# Patient Record
Sex: Female | Born: 1950 | Race: White | Hispanic: No | Marital: Married | Smoking: Never smoker
Health system: Southern US, Community
[De-identification: ages and names within clinical notes are randomized; demographics above are authoritative.]

## PROBLEM LIST (undated history)

## (undated) DIAGNOSIS — E039 Hypothyroidism, unspecified: Secondary | ICD-10-CM

## (undated) DIAGNOSIS — E119 Type 2 diabetes mellitus without complications: Secondary | ICD-10-CM

## (undated) DIAGNOSIS — N289 Disorder of kidney and ureter, unspecified: Secondary | ICD-10-CM

## (undated) DIAGNOSIS — I10 Essential (primary) hypertension: Secondary | ICD-10-CM

## (undated) DIAGNOSIS — R002 Palpitations: Secondary | ICD-10-CM

## (undated) DIAGNOSIS — E785 Hyperlipidemia, unspecified: Secondary | ICD-10-CM

## (undated) DIAGNOSIS — I4819 Other persistent atrial fibrillation: Secondary | ICD-10-CM

## (undated) DIAGNOSIS — I480 Paroxysmal atrial fibrillation: Principal | ICD-10-CM

## (undated) HISTORY — DX: Paroxysmal atrial fibrillation: I48.0

## (undated) HISTORY — DX: Hypothyroidism, unspecified: E03.9

## (undated) HISTORY — PX: EYE SURGERY: SHX253

## (undated) HISTORY — PX: BREAST BIOPSY: SHX20

## (undated) HISTORY — DX: Disorder of kidney and ureter, unspecified: N28.9

## (undated) HISTORY — DX: Type 2 diabetes mellitus without complications: E11.9

## (undated) HISTORY — DX: Palpitations: R00.2

## (undated) HISTORY — DX: Hyperlipidemia, unspecified: E78.5

## (undated) HISTORY — DX: Essential (primary) hypertension: I10

---
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
## 1991-01-24 HISTORY — PX: BREAST EXCISIONAL BIOPSY: SUR124

## 2001-12-04 ENCOUNTER — Ambulatory Visit (HOSPITAL_COMMUNITY): Admission: RE | Admit: 2001-12-04 | Discharge: 2001-12-04 | Payer: Self-pay | Admitting: Gastroenterology

## 2001-12-04 ENCOUNTER — Encounter (INDEPENDENT_AMBULATORY_CARE_PROVIDER_SITE_OTHER): Payer: Self-pay | Admitting: Specialist

## 2002-12-25 ENCOUNTER — Encounter: Admission: RE | Admit: 2002-12-25 | Discharge: 2002-12-25 | Payer: Self-pay | Admitting: Family Medicine

## 2003-07-29 ENCOUNTER — Other Ambulatory Visit: Admission: RE | Admit: 2003-07-29 | Discharge: 2003-07-29 | Payer: Self-pay | Admitting: Family Medicine

## 2004-08-08 ENCOUNTER — Other Ambulatory Visit: Admission: RE | Admit: 2004-08-08 | Discharge: 2004-08-08 | Payer: Self-pay | Admitting: Family Medicine

## 2004-08-24 ENCOUNTER — Encounter: Admission: RE | Admit: 2004-08-24 | Discharge: 2004-08-24 | Payer: Self-pay | Admitting: Family Medicine

## 2004-11-22 ENCOUNTER — Ambulatory Visit: Payer: Self-pay

## 2005-06-09 ENCOUNTER — Ambulatory Visit (HOSPITAL_COMMUNITY): Admission: RE | Admit: 2005-06-09 | Discharge: 2005-06-09 | Payer: Self-pay | Admitting: Podiatry

## 2005-06-09 ENCOUNTER — Encounter: Payer: Self-pay | Admitting: Vascular Surgery

## 2005-08-15 ENCOUNTER — Encounter: Admission: RE | Admit: 2005-08-15 | Discharge: 2005-08-15 | Payer: Self-pay | Admitting: Family Medicine

## 2005-08-15 ENCOUNTER — Ambulatory Visit: Payer: Self-pay | Admitting: *Deleted

## 2005-08-22 ENCOUNTER — Ambulatory Visit: Payer: Self-pay | Admitting: *Deleted

## 2005-08-22 ENCOUNTER — Ambulatory Visit: Payer: Self-pay

## 2005-10-25 ENCOUNTER — Ambulatory Visit: Payer: Self-pay | Admitting: *Deleted

## 2005-11-20 ENCOUNTER — Emergency Department (HOSPITAL_COMMUNITY): Admission: EM | Admit: 2005-11-20 | Discharge: 2005-11-20 | Payer: Self-pay | Admitting: Family Medicine

## 2006-04-06 ENCOUNTER — Encounter: Admission: RE | Admit: 2006-04-06 | Discharge: 2006-04-06 | Payer: Self-pay | Admitting: Family Medicine

## 2007-02-26 ENCOUNTER — Ambulatory Visit: Payer: Self-pay | Admitting: Cardiovascular Disease

## 2007-03-13 ENCOUNTER — Encounter: Payer: Self-pay | Admitting: Cardiovascular Disease

## 2007-03-13 ENCOUNTER — Ambulatory Visit: Payer: Self-pay

## 2007-04-08 ENCOUNTER — Encounter: Admission: RE | Admit: 2007-04-08 | Discharge: 2007-04-08 | Payer: Self-pay | Admitting: Family Medicine

## 2007-09-05 ENCOUNTER — Ambulatory Visit: Payer: Self-pay | Admitting: Cardiovascular Disease

## 2007-09-05 ENCOUNTER — Ambulatory Visit: Payer: Self-pay

## 2008-06-02 ENCOUNTER — Encounter: Admission: RE | Admit: 2008-06-02 | Discharge: 2008-06-02 | Payer: Self-pay | Admitting: Family Medicine

## 2009-01-20 ENCOUNTER — Encounter: Admission: RE | Admit: 2009-01-20 | Discharge: 2009-01-20 | Payer: Self-pay | Admitting: Family Medicine

## 2009-08-12 ENCOUNTER — Encounter: Admission: RE | Admit: 2009-08-12 | Discharge: 2009-08-12 | Payer: Self-pay | Admitting: Family Medicine

## 2010-06-07 NOTE — Assessment & Plan Note (Signed)
[facility]                            CARDIOLOGY OFFICE NOTE   JACARRA, BOBAK                       MRN:          [id number]  DATE:02/26/2007                            DOB:          Mar 28, 1950    Courtney Pearson is a 60 year old patient referred back by Dr. Nonie Hoyer and  Dr. Shaune Pollack. She has had recurrent palpitations.  The patient has  seen Dr. Glennon Hamilton in the past.  He has a history of MVP.  I do not  have a recent echo on her. She had an episode about 3 weeks ago, rapid  palpitations that lasted for about 30 minutes.  There was no associated  chest pain, PND, orthopnea.  There is no syncope.  The heart rate abated  on its own over the course of about 12 hours, but was really rapid for  only 30 minutes.  Her blood sugar was elevated at the time and she took  some extra insulin. By the next morning, she felt normal.  She has had a  previous PDS heart monitor done in July 2007, which showed isolated  bursts of atrial flutter versus PSVT. The patient had been on beta  blockers back in 2007, however, she had a fairly profound hypoglycemic  event with loss of consciousness and Dr. Kevan Ny decided that the risk of  exacerbating hypoglycemia warranted stopping her beta blocker.  There  was some discussion with her endocrinologist about this.  However, since  the patient's palpitations were infrequent I think that overall this was  probably a reasonable move.   The patient has not had a recurrence in the last 3 weeks.   Coronary risk factors include hypertension and diabetes.  She has been a  diabetic for over 35 years.  She does have some retinopathy but no other  complications.  She had a nuclear stress test in our office in July 2007  which was nonischemic with a normal EF.   Her review of systems otherwise negative.   PAST MEDICAL HISTORY:  Is remarkable for history of palpitations with  question of MVP, previous eye surgery with Dr.  Ashley Royalty, two previous C-  sections, previous right breast biopsy hyperthyroidism, diabetes and  hypertension.   ALLERGIES:  She denies any allergies.   CURRENT MEDICATIONS:  Include Humalog insulin, Diovan 80/12.5,  Synthroid 25 mcg a day, vitamin D and calcium.  We will add short-acting  Imdur all 10 mg p.r.n.   FAMILY HISTORY:  Is noncontributory.   The patient is remarried.  She has two older children by her first  marriage. Her husband is 9 years older than her. She is sedentary,  neither one of them work. She works on the computer quite a bit and  reads. She does not drink or smoke.   PHYSICAL EXAMINATION:  Is remarkable for healthy-appearing elderly white  female in no distress.  Blood pressure is 120/74, pulse 58 and regular,  afebrile.  Weight is 181.  HEENT:  Unremarkable.  Carotids are normal without bruit, no  lymphadenopathy, thyromegaly, JVP elevation.  LUNGS:  Clear with good  diaphragmatic motion.  No wheezing.  S1-S2.  There is a MR murmur at the apex.  ABDOMEN:  Benign.  Bowel sounds positive.  No AAA. No tenderness, no  hepatosplenomegaly, no hepatojugular reflux.  Distal pulses are intact, no edema.  NEURO: Nonfocal.  SKIN:  Warm and dry.   EKG shows sinus rhythm with incomplete right bundle branch block.   IMPRESSION:  1. 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  A 10 mg      Inderal prescription given to take p.r.n. Any recurrences she will      call to get a repeat PDS monitor and we will make an      electrophysiology referral  2. Mitral valve murmur.  Follow-up echocardiogram.  I did go over the      new guidelines with her in regards to no need for SBE prophylaxis.      We will check her LV function and mitral valve morphology.  3. In regards coronary artery disease, she is a lifelong diabetic. Her      last Myoview was in July 2007, I would probably repeat one in July      2009.  4. Hypothyroidism.  Continue Levothyroxine 25 mcg a day.  TSH and T4 to      be checked by Dr. Shaune Pollack for yearly physical next month.  5. Risk factor prophylaxis.  I would also check lipid and liver      profile with her routine lab work for her physical.  I suspect she      should probably be on a statin drug given her longstanding      diabetes.     Courtney Pick. Eden Emms, MD, [facility]  Electronically Signed    PCN/MedQ  DD: 02/26/2007  DT: 02/26/2007  Job #: [id number]   cc:   Duncan Dull, M.D.  Simone Curia

## 2010-06-07 NOTE — Assessment & Plan Note (Signed)
[facility]                            CARDIOLOGY OFFICE NOTE   Courtney Pearson, Courtney Pearson                       MRN:          [id number]  DATE:09/05/2007                            DOB:          [birth year]    Courtney Pearson returns today for followup.  She was previously patient of  Dr. Glennon Hamilton.  I last saw her in February.  She has longstanding  diabetes.  She has no documented coronary artery disease.  Her last  Myoview in July 2007 was nonischemic with an EF of 70%.   The patient has had ongoing palpitations.  She has a distant history of  isolated short PSVT.  No documented afib.  She had a recent colonoscopy  and was found to have PACs.  She feels that her palpitations are  increasing.  She does not describe typical PSVT-like symptoms.  She  describes forceful flip-flops in her heart.  I suspect she is having  some atrial arrhythmias, but not afib or flutter or anything that would  require Coumadin.  She tends to get these episodes at night,  particularly when she lays down since she is getting them frequently.  We will give her an event monitor and see what we find.  She does get  occasional exertional dyspnea.  I do not know this is an anginal  equivalent as she is a longstanding diabetic and she needs a followup  stress Myoview.  Her baseline EKG shows a pulmonary disease pattern with  an incomplete right bundle-branch block and nonspecific ST-T wave  changes and she is not a candidate for routine treadmill.   Her last echocardiogram showed an EF of 55-60%.  She carries a diagnosis  of mitral valve prolapse.  She had mild MR with mitral annular  calcification, but no prolapse.  This echo was done in February.   Her review of systems is otherwise negative.   She has no known allergies.   She is on:  1. Humalog.  2. Diovan and hydrochlorothiazide 80/12.5.  3. Synthroid 25 mcg a day.  She tells me her TSH was normal 2 months      ago.  4.  Vitamin D and calcium.  5. She does take baby aspirin daily.   PHYSICAL EXAMINATION:  GENERAL:  Remarkable for healthy-appearing, white  female in no distress.  VITAL SIGNS:  Weight is 178, blood pressure is 124/71, pulse 71 regular,  and afebrile.  HEENT:  Unremarkable.  NECK:  Carotids are normal without bruit. No lymphadenopathy,  thyromegaly, or JVP elevation.  LUNGS:  Clear.  Good diaphragmatic motion.  No wheezing.  S1 and S2 with  a systolic ejection murmur.  PMI normal.  \\  ABDOMEN:  Benign.  Bowel sounds positive.  No AAA.  No tenderness.  No  bruit.  No hepatosplenomegaly or hepatojugular reflux.  No tenderness.  EXTREMITIES:  Distal pulses were intact.  No edema.  NEURO:  Nonfocal.  SKIN:  Warm and dry.  No muscular weakness.   EKG was as described.   IMPRESSION:  1. Exertional dyspnea, question anginal equivalent and  a longstanding      diabetic, history of increasing palpitations, rule out ischemic      disease at baseline abnormal EKG.  Followup stress Myoview.  2. Premature atrial contractions during colonoscopy.  History of      paroxysmal supraventricular tachycardia.  Check event monitor.      Continue p.r.n. Inderal.  We do not have her on long-acting      medications due to her previous severe hypoglycemic reaction.  If      she has anything outside a premature atrial contractions documented      in her Myoview is nonischemic, she may be a candidate for an      antiarrhythmic drug.  3. Hypothyroidism.  Continue Synthroid 25 mcg a day.  TSH and T4      apparently normal.  4. Diabetes.  Hemoglobin A1c quarterly.  5. Hypertension in the setting of diabetes.  Continue Diovan and low-      salt diet.  I will see her back in 6 months, so long as her event      monitor and Myoview do not show any significant arrhythmias or      ischemia.      Courtney Pearson. Eden Emms, MD, [facility]  Electronically Signed    PCN/MedQ  DD: 09/05/2007  DT: 09/06/2007  Job #: [id number]

## 2010-06-10 NOTE — Op Note (Signed)
NAMELIESA, TSAN                          ACCOUNT NO.:  [id number]   MEDICAL RECORD NO.:  [id number]                   PATIENT TYPE:  AMB   LOCATION:  ENDO                                 FACILITY:  [facility]   PHYSICIAN:  Petra Kuba, M.D.                 DATE OF BIRTH:  1950-03-27   DATE OF PROCEDURE:  12/04/2001  DATE OF DISCHARGE:                                 OPERATIVE REPORT   PROCEDURE:  Colonoscopy.   INDICATIONS FOR PROCEDURE:  A patient with multiple GI complaints due for  colonic screening.   Consent was signed after risks, benefits, methods, and options were  thoroughly discussed in the office.   MEDICINES USED:  Demerol 60, Versed 8.   DESCRIPTION OF PROCEDURE:  Rectal inspection was pertinent for an external  skin tag and external hemorrhoids. Digital exam was negative. The pediatric  video adjustable colonoscope was inserted and with some difficulty due to  tortuous colon, we were able to advance to the cecum. This did require  rolling her on her back and various abdominal pressures. No obvious  abnormality was seen on insertion. The cecum was identified by the  appendiceal orifice and the ileocecal valve. In fact, the scope was inserted  a short ways into the terminal ileum which was normal. Photo documentation  was obtained, the scope was slowly withdrawn. The prep was adequate. There  was some liquid stool that required washing and suctioning on slow  withdrawal through the colon. The cecum, ascending, transverse and majority  of the descending was normal. In the proximal level of the sigmoid and  descending junction, a small raised area was seen, possibly this was a  suction mark from insertion, possibly a small polyp which was cold biopsied  x1. The scope was further withdrawn and no additional findings were seen as  we slowly withdrew back to the rectum. Once back in the rectum, the scope  was retroflexed pertinent for some internal hemorrhoids, small.  The scope  was straightened and readvanced a short ways up the left side of the colon,  air was suctioned, scope removed. The patient tolerated the procedure well.  There was no obvious or immediate complication.   ENDOSCOPIC DIAGNOSIS:  1. Internal and external hemorrhoids small an external skin tag.  2. Questionable tiny polyp versus suction mark in the sigmoid descending     junction status post cold biopsy.  3. Tortuous colon.  4. Otherwise within normal limits to the cecum and terminal ileum.    PLAN:  Await pathology but probably recheck colon screening in 5-10 years.  Happy to see back p.r.n., otherwise, return care to Dr. Kevan Ny for the  customary health care maintenance to include yearly rectals and guaiacs.  Petra Kuba, M.D.    MEM/MEDQ  D:  12/04/2001  T:  12/04/2001  Job:  [id number]   cc:   Duncan Dull, M.D.  [address]  [place]  [place] [address]  Fax: [phone]

## 2010-06-10 NOTE — Letter (Signed)
August 15, 2005     Duncan Dull, MD  [address]  [address]  [place], [place] [address]   RE:  Courtney, Pearson  MRN:  [id number]  /  DOB:  1950-12-10   Dear Lupita Leash:   It was a pleasure seeing this nice patient, Courtney Pearson, regarding  evaluation on August 15, 2005.  As you know, she is a very pleasant 60-year-  old white married female with history of mitral valve prolapse.  She also  has a history of hypertension and diabetes.  Recently she has noted  skipping heart beats.  She describes this as individual skip and then  followed by a hard beat.  This certainly sounds like PVCs.  She has  occasional chest tightness relieved by belching.  This is not related to  exertion.   It is noted the patient had a stress echo on November 22, 2004.  There was no  evidence of inducible wall motion abnormalities to suggest ischemia.  There  were abnormal ST segment changes.   Patient had laser surgery for retinopathy in 2002 and 2007.  She has a  history of breast lump in 1993.  She does not smoke or use alcohol.  She has  no allergies.   MEDICATIONS:  Humalog insulin, Diovan/hydrochlorothiazide 80/12.5.   REVIEW OF SYSTEMS:  She has occasional dizziness.  These are not associated  with the palpitations.  Eyes reveal some retinopathy with laser therapy.  Ears, Nose and Throat:  She may have some slight hearing loss.  Cardiorespiratory as noted above.  GI negative.  Genitourinary history  negative.  Gynecologic history:  She has 2 children. The remainder of the  review of systems is unremarkable.   ALLERGIES:  None.   SOCIAL HISTORY:  She is retired.  Moved here recently from [place].   FAMILY HISTORY:  Father died of anemia but had history of irregular  heartbeat.  Mother died of heart failure.  There is a family history of  hypertension and gout.   PHYSICAL EXAMINATION:  Blood pressure 122/68.  Pulse 69.  Normal sinus  rhythm.  JVP is not elevated. Carotid pulse is palpable  and equal without  bruits.  LUNGS:  Clear.  CARDIAC EXAM: 1/6 systolic murmur at left sternal border and a mid systolic  click and short systolic murmur at the apex.  ABDOMEN:  Not remarkable.  EXTREMITIES:  Normal.  Peripheral pulses are diminished.  She states she had  Doppler scans recently showing normal arterial flow.   Recent labs revealed glucose 184, LDL cholesterol 93.  The renal profile was  unremarkable.  BUN high normal at 26 and creatinine of 1.3.   EKG reveals RSR prime, otherwise normal.   IMPRESSION:  1.  Palpitations.  2.  Mitral valve prolapse.  3.  Hypertension, controlled.  4.  Diabetes mellitus on therapy.   PLAN:  Because of the history of abnormal stress EKG and the fact that she  has occasional tightness and palpitations, I have suggested a stress  Myoview.  In addition, we plan an event monitor.  I also instructed her to  take aspirin 325 and Zocor 20 because of her history of diabetes and  hypertension.   Thank you for the opportunity to share in this nice patient's care.  I will  plan to see her back in 4 to 6 weeks or at any other time you so desire.  With best regards.    Sincerely,  Cecil Cranker, MD, [facility]   EJL/MedQ  DD:  08/15/2005  DT:  08/15/2005  Job #:  [id number]

## 2010-06-10 NOTE — Letter (Signed)
October 25, 2005     Duncan Dull, M.D.  [address]  [address]  [place], [place] [address]   RE:  Courtney, Pearson  MRN:  [id number]  /  DOB:  [birth year]   Dear Lupita Leash:   It was a pleasure seeing this patient, Courtney Pearson, for followup on  October 25, 2005. As you know, she has mitral valve prolapse with  hypertension, diabetes, and palpitations. She underwent a stress study which  was normal. Left ventricular systolic function was normal. A Holt event  monitor revealed an episode of nine beat supraventricular tachycardia with a  rate of 200. She also had frequent premature atrial contractions. Previous  echocardiogram revealed normal left ventricular with an ejection fraction of  65-70%, with prominent echo density in the posterior mitral annulus,  possibly related to calcification. There was no significant mitral valve  prolapse noted. There was mild mitral regurgitation. Following the event  monitor and documentation of the brief supraventricular tachycardia, she was  started on Toprol XL 50 mg, and she has noted a reduction in palpitation.   She is also on Humalog, Insulin, Diovan, and hydrochlorothiazide 80/12.5.  VITAL SIGNS: Blood pressure 114/74, pulse 68 normal sinus rhythm.  GENERAL  APPEARANCE:  Normal.  JVP is not elevated.  Carotid pulses palpable without  bruits.  LUNGS:  Clear.  CARDIAC:  Exam reveals a 1/6 short systolic murmur,  left sternal border, no murmur or click. Echocardiogram reveals an RSR prime  consistent with right ventricular conduction delay, otherwise normal and  unchanged.   IMPRESSION:  Diagnosis as above. Tachy-palpitations probably related to  supraventricular tachycardia, improved on beta-blocker.  I suggested she  continue on the same therapy.  I will be happy to see her again in three or  four months and, if she has not had a recent  TSH, I think this should be performed. Thank you for the opportunity to  share in this nice patient's  care.    Sincerely,     ______________________________  E. Graceann Congress, MD, [facility]    EJL/MedQ  /  Job #:  [id number]  DD:  10/25/2005 / DT:  10/27/2005

## 2010-08-18 ENCOUNTER — Ambulatory Visit (INDEPENDENT_AMBULATORY_CARE_PROVIDER_SITE_OTHER): Payer: Managed Care, Other (non HMO) | Admitting: Ophthalmology

## 2010-08-18 DIAGNOSIS — H35379 Puckering of macula, unspecified eye: Secondary | ICD-10-CM

## 2010-08-18 DIAGNOSIS — E11359 Type 2 diabetes mellitus with proliferative diabetic retinopathy without macular edema: Secondary | ICD-10-CM

## 2010-08-18 DIAGNOSIS — H43819 Vitreous degeneration, unspecified eye: Secondary | ICD-10-CM

## 2010-11-11 ENCOUNTER — Other Ambulatory Visit: Payer: Self-pay | Admitting: Cardiology

## 2010-11-11 DIAGNOSIS — M79609 Pain in unspecified limb: Secondary | ICD-10-CM

## 2010-11-14 ENCOUNTER — Encounter (INDEPENDENT_AMBULATORY_CARE_PROVIDER_SITE_OTHER): Payer: Managed Care, Other (non HMO) | Admitting: *Deleted

## 2010-11-14 DIAGNOSIS — M79609 Pain in unspecified limb: Secondary | ICD-10-CM

## 2010-11-14 DIAGNOSIS — E1159 Type 2 diabetes mellitus with other circulatory complications: Secondary | ICD-10-CM

## 2011-02-22 ENCOUNTER — Ambulatory Visit (INDEPENDENT_AMBULATORY_CARE_PROVIDER_SITE_OTHER): Payer: Managed Care, Other (non HMO) | Admitting: Ophthalmology

## 2011-02-22 DIAGNOSIS — E11359 Type 2 diabetes mellitus with proliferative diabetic retinopathy without macular edema: Secondary | ICD-10-CM

## 2011-02-22 DIAGNOSIS — H43819 Vitreous degeneration, unspecified eye: Secondary | ICD-10-CM

## 2011-02-22 DIAGNOSIS — E1139 Type 2 diabetes mellitus with other diabetic ophthalmic complication: Secondary | ICD-10-CM

## 2011-08-25 ENCOUNTER — Ambulatory Visit (INDEPENDENT_AMBULATORY_CARE_PROVIDER_SITE_OTHER): Payer: Managed Care, Other (non HMO) | Admitting: Ophthalmology

## 2011-08-25 DIAGNOSIS — I10 Essential (primary) hypertension: Secondary | ICD-10-CM

## 2011-08-25 DIAGNOSIS — E11359 Type 2 diabetes mellitus with proliferative diabetic retinopathy without macular edema: Secondary | ICD-10-CM

## 2011-08-25 DIAGNOSIS — H43819 Vitreous degeneration, unspecified eye: Secondary | ICD-10-CM

## 2011-08-25 DIAGNOSIS — H35039 Hypertensive retinopathy, unspecified eye: Secondary | ICD-10-CM

## 2011-08-25 DIAGNOSIS — E1039 Type 1 diabetes mellitus with other diabetic ophthalmic complication: Secondary | ICD-10-CM

## 2011-10-31 ENCOUNTER — Other Ambulatory Visit: Payer: Self-pay | Admitting: Family Medicine

## 2011-10-31 DIAGNOSIS — Z1231 Encounter for screening mammogram for malignant neoplasm of breast: Secondary | ICD-10-CM

## 2011-11-01 ENCOUNTER — Other Ambulatory Visit: Payer: Self-pay | Admitting: Family Medicine

## 2011-11-01 ENCOUNTER — Ambulatory Visit
Admission: RE | Admit: 2011-11-01 | Discharge: 2011-11-01 | Disposition: A | Discharge status: Home / Self Care | Payer: Managed Care, Other (non HMO) | Source: Ambulatory Visit | Attending: Family Medicine | Admitting: Family Medicine

## 2011-11-01 DIAGNOSIS — R0781 Pleurodynia: Secondary | ICD-10-CM

## 2011-11-01 DIAGNOSIS — R059 Cough, unspecified: Secondary | ICD-10-CM

## 2011-11-01 DIAGNOSIS — R05 Cough: Secondary | ICD-10-CM

## 2011-11-02 ENCOUNTER — Other Ambulatory Visit: Payer: Managed Care, Other (non HMO)

## 2011-11-16 ENCOUNTER — Ambulatory Visit: Payer: Managed Care, Other (non HMO)

## 2011-12-27 ENCOUNTER — Ambulatory Visit
Admission: RE | Admit: 2011-12-27 | Discharge: 2011-12-27 | Disposition: A | Discharge status: Home / Self Care | Payer: Managed Care, Other (non HMO) | Source: Ambulatory Visit | Attending: Family Medicine | Admitting: Family Medicine

## 2011-12-27 DIAGNOSIS — Z1231 Encounter for screening mammogram for malignant neoplasm of breast: Secondary | ICD-10-CM

## 2012-02-28 ENCOUNTER — Ambulatory Visit (INDEPENDENT_AMBULATORY_CARE_PROVIDER_SITE_OTHER): Payer: Managed Care, Other (non HMO) | Admitting: Ophthalmology

## 2012-02-28 DIAGNOSIS — E11359 Type 2 diabetes mellitus with proliferative diabetic retinopathy without macular edema: Secondary | ICD-10-CM

## 2012-02-28 DIAGNOSIS — H35349 Macular cyst, hole, or pseudohole, unspecified eye: Secondary | ICD-10-CM

## 2012-02-28 DIAGNOSIS — H43819 Vitreous degeneration, unspecified eye: Secondary | ICD-10-CM

## 2012-02-28 DIAGNOSIS — I10 Essential (primary) hypertension: Secondary | ICD-10-CM

## 2012-02-28 DIAGNOSIS — E1039 Type 1 diabetes mellitus with other diabetic ophthalmic complication: Secondary | ICD-10-CM

## 2012-02-28 DIAGNOSIS — H35039 Hypertensive retinopathy, unspecified eye: Secondary | ICD-10-CM

## 2012-08-09 ENCOUNTER — Encounter: Payer: Managed Care, Other (non HMO) | Admitting: Cardiovascular Disease

## 2012-09-04 ENCOUNTER — Ambulatory Visit (INDEPENDENT_AMBULATORY_CARE_PROVIDER_SITE_OTHER): Payer: Managed Care, Other (non HMO) | Admitting: Ophthalmology

## 2012-09-12 ENCOUNTER — Ambulatory Visit (INDEPENDENT_AMBULATORY_CARE_PROVIDER_SITE_OTHER): Payer: Managed Care, Other (non HMO) | Admitting: Ophthalmology

## 2012-09-12 DIAGNOSIS — E11359 Type 2 diabetes mellitus with proliferative diabetic retinopathy without macular edema: Secondary | ICD-10-CM

## 2012-09-12 DIAGNOSIS — H43819 Vitreous degeneration, unspecified eye: Secondary | ICD-10-CM

## 2012-09-12 DIAGNOSIS — I10 Essential (primary) hypertension: Secondary | ICD-10-CM

## 2012-09-12 DIAGNOSIS — H35039 Hypertensive retinopathy, unspecified eye: Secondary | ICD-10-CM

## 2012-09-12 DIAGNOSIS — E1039 Type 1 diabetes mellitus with other diabetic ophthalmic complication: Secondary | ICD-10-CM

## 2012-09-25 ENCOUNTER — Encounter: Payer: Managed Care, Other (non HMO) | Admitting: Cardiovascular Disease

## 2012-10-30 ENCOUNTER — Encounter (INDEPENDENT_AMBULATORY_CARE_PROVIDER_SITE_OTHER): Payer: Self-pay

## 2012-10-30 ENCOUNTER — Encounter: Payer: Self-pay | Admitting: *Deleted

## 2012-10-30 ENCOUNTER — Encounter: Payer: Self-pay | Admitting: Cardiovascular Disease

## 2012-10-30 ENCOUNTER — Ambulatory Visit (INDEPENDENT_AMBULATORY_CARE_PROVIDER_SITE_OTHER): Payer: Managed Care, Other (non HMO) | Admitting: Cardiology

## 2012-10-30 VITALS — BP 134/66 | HR 63 | Ht 67.0 in | Wt 186.0 lb

## 2012-10-30 DIAGNOSIS — E785 Hyperlipidemia, unspecified: Secondary | ICD-10-CM

## 2012-10-30 DIAGNOSIS — I10 Essential (primary) hypertension: Secondary | ICD-10-CM | POA: Insufficient documentation

## 2012-10-30 DIAGNOSIS — R0609 Other forms of dyspnea: Secondary | ICD-10-CM

## 2012-10-30 DIAGNOSIS — R0600 Dyspnea, unspecified: Secondary | ICD-10-CM

## 2012-10-30 DIAGNOSIS — R002 Palpitations: Secondary | ICD-10-CM

## 2012-10-30 MED ORDER — PROPRANOLOL HCL 10 MG PO TABS: 10.0000 mg | ORAL_TABLET | ORAL | Status: DC | PRN

## 2012-10-30 NOTE — Patient Instructions (Signed)
Your physician wants you to follow-up in: ONE YEAR WITH DR CRENSHAW You will receive a reminder letter in the mail two months in advance. If you don't receive a letter, please call our office to schedule the follow-up appointment.   Your physician has requested that you have en exercise stress myoview. For further information please visit [email or web address]. Please follow instruction sheet, as given.   

## 2012-10-30 NOTE — Assessment & Plan Note (Signed)
Continue statin. 

## 2012-10-30 NOTE — Progress Notes (Signed)
HPI: 62 year old female for evaluation of dyspnea. The patient does have a history of palpitations felt secondary to PACs. Previously seen in this office but not since 2009 by Dr. Eden Emms. She had a nuclear study in July 2007 that showed an ejection fraction of 70% and no ischemia. Also with distant history of short paroxysmal supraventricular tachycardia. Echocardiogram in 2009 showed normal LV function, grade 1 diastolic dysfunction and mild mitral regurgitation. ABIs in October 2012 were normal. Patient states that she has noticed a recent increase in her palpitations. They are described as a skip and flutter. She decreased her caffeine intake in her symptoms have improved. She has not had sustained palpitations. She does have dyspnea on exertion. No orthopnea, PND, pedal edema, chest pain or syncope.   Current Outpatient Prescriptions  Medication Sig Dispense Refill  . aspirin EC 81 MG tablet Take 81 mg by mouth daily.      Marland Kitchen atorvastatin (LIPITOR) 10 MG tablet Take 1 tablet by mouth daily.      Marland Kitchen CALCIUM PO Take 750 mg by mouth 2 (two) times daily.      . Cholecalciferol (VITAMIN D PO) Take 1,000 mg by mouth daily.      . insulin lispro (HUMALOG) 100 UNIT/ML SOCT Inject 25 Units into the skin.      Marland Kitchen propranolol (INDERAL) 10 MG tablet Take 10 mg by mouth as needed.      Marland Kitchen SYNTHROID 75 MCG tablet Take 1 tablet by mouth daily.      . valsartan (DIOVAN) 40 MG tablet Take 1 tablet by mouth daily.       No current facility-administered medications for this visit.    No Known Allergies  Past Medical History  Diagnosis Date  . Palpitation   . Hypothyroidism   . HTN (hypertension)   . Diabetes   . Hyperlipidemia   . Renal insufficiency     Past Surgical History  Procedure Laterality Date  . Eye surgery    . Cesarean section    . Breast biopsy      History   Social History  . Marital Status: Married    Spouse Name: N/A    Number of Children: 2  . Years of Education: N/A    Occupational History  . Not on file.   Social History Main Topics  . Smoking status: Never Smoker   . Smokeless tobacco: Not on file  . Alcohol Use: No  . Drug Use: Not on file  . Sexual Activity: Not on file   Other Topics Concern  . Not on file   Social History Narrative  . No narrative on file    Family History  Problem Relation Age of Onset  . Heart disease      No family history    ROS: no fevers or chills, productive cough, hemoptysis, dysphasia, odynophagia, melena, hematochezia, dysuria, hematuria, rash, seizure activity, orthopnea, PND, pedal edema, claudication. Remaining systems are negative.  Physical Exam:   Blood pressure 134/66, pulse 63, height 5\' 7"  (1.702 m), weight 186 lb (84.369 kg).  General:  Well developed/well nourished in NAD Skin warm/dry Patient not depressed No peripheral clubbing Back-normal HEENT-normal/normal eyelids Neck supple/normal carotid upstroke bilaterally; no bruits; no JVD; no thyromegaly chest - CTA/ normal expansion CV - RRR/normal S1 and S2; no murmurs, rubs or gallops;  PMI nondisplaced Abdomen -NT/ND, no HSM, no mass, + bowel sounds, no bruit 2+ femoral pulses, no bruits Ext-no edema, chords, 1+ DP Neuro-grossly  nonfocal  ECG sinus rhythm at a rate of 63. Incomplete right bundle branch block. No ST changes.

## 2012-10-30 NOTE — Assessment & Plan Note (Signed)
Patient with 45 years of diabetes mellitus. Schedule Myoview to exclude anginal equivalent.

## 2012-10-30 NOTE — Assessment & Plan Note (Signed)
Symptoms have improved with decrease caffeine use. Continue when necessary propranolol.

## 2012-10-30 NOTE — Assessment & Plan Note (Signed)
Blood pressure controlled. Continue present medications. 

## 2012-11-15 ENCOUNTER — Encounter: Payer: Self-pay | Admitting: Cardiology

## 2012-11-19 ENCOUNTER — Ambulatory Visit (HOSPITAL_COMMUNITY): Payer: Managed Care, Other (non HMO) | Attending: Cardiology | Admitting: Radiology

## 2012-11-19 VITALS — BP 141/65 | HR 61 | Ht 67.0 in | Wt 184.0 lb

## 2012-11-19 DIAGNOSIS — I4949 Other premature depolarization: Secondary | ICD-10-CM

## 2012-11-19 DIAGNOSIS — R0989 Other specified symptoms and signs involving the circulatory and respiratory systems: Secondary | ICD-10-CM | POA: Insufficient documentation

## 2012-11-19 DIAGNOSIS — E119 Type 2 diabetes mellitus without complications: Secondary | ICD-10-CM | POA: Insufficient documentation

## 2012-11-19 DIAGNOSIS — R002 Palpitations: Secondary | ICD-10-CM | POA: Insufficient documentation

## 2012-11-19 DIAGNOSIS — E785 Hyperlipidemia, unspecified: Secondary | ICD-10-CM | POA: Insufficient documentation

## 2012-11-19 DIAGNOSIS — R0602 Shortness of breath: Secondary | ICD-10-CM

## 2012-11-19 DIAGNOSIS — R0609 Other forms of dyspnea: Secondary | ICD-10-CM | POA: Insufficient documentation

## 2012-11-19 DIAGNOSIS — Z794 Long term (current) use of insulin: Secondary | ICD-10-CM | POA: Insufficient documentation

## 2012-11-19 DIAGNOSIS — I10 Essential (primary) hypertension: Secondary | ICD-10-CM

## 2012-11-19 MED ORDER — TECHNETIUM TC 99M SESTAMIBI GENERIC - CARDIOLITE
11.0000 | Freq: Once | INTRAVENOUS | Status: AC | PRN
  Administered 2012-11-19: 11 via INTRAVENOUS

## 2012-11-19 MED ORDER — TECHNETIUM TC 99M SESTAMIBI GENERIC - CARDIOLITE
33.0000 | Freq: Once | INTRAVENOUS | Status: AC | PRN
  Administered 2012-11-19: 33 via INTRAVENOUS

## 2012-11-19 NOTE — Progress Notes (Signed)
[facility] SITE 3 NUCLEAR MED [address] [place], [place] [address] [phone]    Cardiology Nuclear Med Study  Courtney Pearson is a 62 y.o. female     MRN : [id number]     DOB: 06-15-1950  Procedure Date: 11/19/2012  Nuclear Med Background Indication for Stress Test:  Evaluation for Ischemia History:  '07 QMV:HQIONG, EF=70%; Echo Cardiac Risk Factors: Hypertension, IDDM Type 2 and Lipids  Symptoms:  DOE and Palpitations   Nuclear Pre-Procedure Caffeine/Decaff Intake:  None NPO After: 11:00pm   Lungs:  Clear. O2 Sat: 99% on room air. IV 0.9% NS with Angio Cath:  22g  IV Site: R Hand  IV Started by:  Cathlyn Parsons, RN  Chest Size (in):  38 Cup Size: B  Height: 5\' 7"  (1.702 m)  Weight:  184 lb (83.462 kg)  BMI:  Body mass index is 28.81 kg/(m^2). Tech Comments:  n/a    Nuclear Med Study 1 or 2 day study: 1 day  Stress Test Type:  Stress  Reading MD: Willa Rough, MD  Order Authorizing Provider:  Ripley Fraise  Resting Radionuclide: Technetium 46m Sestamibi  Resting Radionuclide Dose: 11.0 mCi   Stress Radionuclide:  Technetium 63m Sestamibi  Stress Radionuclide Dose: 33.0 mCi           Stress Protocol Rest HR: 61 Stress HR: 136  Rest BP: 141/65 Stress BP: 195/63  Exercise Time (min): 4:31 METS: 5.5   Predicted Max HR: 157 bpm % Max HR: 86.62 bpm Rate Pressure Product: 29528   Dose of Adenosine (mg):  n/a Dose of Lexiscan: n/a mg  Dose of Atropine (mg): n/a Dose of Dobutamine: n/a mcg/kg/min (at max HR)  Stress Test Technologist: Smiley Houseman, CMA-N  Nuclear Technologist:  Doyne Keel, CNMT     Rest Procedure:  Myocardial perfusion imaging was performed at rest 45 minutes following the intravenous administration of Technetium 83m Sestamibi.  Rest ECG: Incomplete right bundle branch block. Sinus rhythm. Nonspecific ST-T wave changes.  Stress Procedure:  The patient exercised on the treadmill utilizing the Bruce Protocol for 4:31 minutes.  The patient stopped due to fatigue and shortness of breath.  He denied any chest pain.  There were occasional PVC's and PAC's noted.   Technetium 56m Sestamibi was injected at peak exercise and myocardial perfusion imaging was performed after a brief delay.  Stress ECG: No significant change from baseline ECG  QPS Raw Data Images:  Normal; no motion artifact; normal heart/lung ratio. Stress Images:  Very small area of mild decreased uptake at the mid/apical anterolateral wall. This is probably breast attenuation. Rest Images:  Normal homogeneous uptake in all areas of the myocardium. Subtraction (SDS):  No evidence of ischemia. Transient Ischemic Dilatation (Normal <1.22):  0.97 Lung/Heart Ratio (Normal <0.45):  0.32  Quantitative Gated Spect Images QGS EDV:  69 ml QGS ESV:  12 ml  Impression Exercise Capacity:  Decreased exercise tolerance BP Response:  Blood pressure rose to 195 systolic when the patient stood to start the treadmill. Clinical Symptoms:  There was shortness of breath. Study was limited by fatigue. There was no chest pain. ECG Impression:  No significant ST segment change suggestive of ischemia. Comparison with Prior Nuclear Study: No images to compare  Overall Impression:  Normal stress nuclear study.  There is no definite scar or ischemia. There is normal wall motion. There is mild shifting breast attenuation. This is a low risk scan. However the patient has decreased exercise tolerance.  LV  Ejection Fraction: 83%.  LV Wall Motion:  Normal Wall Motion.  Willa Rough, MD

## 2013-03-26 ENCOUNTER — Ambulatory Visit (INDEPENDENT_AMBULATORY_CARE_PROVIDER_SITE_OTHER): Payer: Managed Care, Other (non HMO) | Admitting: Ophthalmology

## 2013-03-26 DIAGNOSIS — E11359 Type 2 diabetes mellitus with proliferative diabetic retinopathy without macular edema: Secondary | ICD-10-CM

## 2013-03-26 DIAGNOSIS — E1065 Type 1 diabetes mellitus with hyperglycemia: Secondary | ICD-10-CM

## 2013-03-26 DIAGNOSIS — H35379 Puckering of macula, unspecified eye: Secondary | ICD-10-CM

## 2013-03-26 DIAGNOSIS — H35039 Hypertensive retinopathy, unspecified eye: Secondary | ICD-10-CM

## 2013-03-26 DIAGNOSIS — I10 Essential (primary) hypertension: Secondary | ICD-10-CM

## 2013-03-26 DIAGNOSIS — E1039 Type 1 diabetes mellitus with other diabetic ophthalmic complication: Secondary | ICD-10-CM

## 2013-03-26 DIAGNOSIS — H43819 Vitreous degeneration, unspecified eye: Secondary | ICD-10-CM

## 2013-08-21 ENCOUNTER — Other Ambulatory Visit: Payer: Self-pay

## 2013-08-21 DIAGNOSIS — Z1231 Encounter for screening mammogram for malignant neoplasm of breast: Secondary | ICD-10-CM

## 2013-08-22 ENCOUNTER — Ambulatory Visit
Admission: RE | Admit: 2013-08-22 | Discharge: 2013-08-22 | Disposition: A | Discharge status: Home / Self Care | Payer: Managed Care, Other (non HMO) | Source: Ambulatory Visit | Attending: Family Medicine | Admitting: Family Medicine

## 2013-08-22 ENCOUNTER — Other Ambulatory Visit: Payer: Self-pay | Admitting: Family Medicine

## 2013-08-22 DIAGNOSIS — M533 Sacrococcygeal disorders, not elsewhere classified: Secondary | ICD-10-CM

## 2013-08-27 ENCOUNTER — Encounter (INDEPENDENT_AMBULATORY_CARE_PROVIDER_SITE_OTHER): Payer: Self-pay

## 2013-08-27 ENCOUNTER — Ambulatory Visit
Admission: RE | Admit: 2013-08-27 | Discharge: 2013-08-27 | Disposition: A | Discharge status: Home / Self Care | Payer: Managed Care, Other (non HMO) | Source: Ambulatory Visit

## 2013-08-27 DIAGNOSIS — Z1231 Encounter for screening mammogram for malignant neoplasm of breast: Secondary | ICD-10-CM

## 2013-10-01 ENCOUNTER — Ambulatory Visit (INDEPENDENT_AMBULATORY_CARE_PROVIDER_SITE_OTHER): Payer: Managed Care, Other (non HMO) | Admitting: Ophthalmology

## 2013-10-01 DIAGNOSIS — E1065 Type 1 diabetes mellitus with hyperglycemia: Secondary | ICD-10-CM

## 2013-10-01 DIAGNOSIS — I10 Essential (primary) hypertension: Secondary | ICD-10-CM

## 2013-10-01 DIAGNOSIS — E11359 Type 2 diabetes mellitus with proliferative diabetic retinopathy without macular edema: Secondary | ICD-10-CM

## 2013-10-01 DIAGNOSIS — H35379 Puckering of macula, unspecified eye: Secondary | ICD-10-CM

## 2013-10-01 DIAGNOSIS — H43819 Vitreous degeneration, unspecified eye: Secondary | ICD-10-CM

## 2013-10-01 DIAGNOSIS — E1039 Type 1 diabetes mellitus with other diabetic ophthalmic complication: Secondary | ICD-10-CM

## 2013-10-01 DIAGNOSIS — H35039 Hypertensive retinopathy, unspecified eye: Secondary | ICD-10-CM

## 2014-02-27 DIAGNOSIS — Z6829 Body mass index (BMI) 29.0-29.9, adult: Secondary | ICD-10-CM | POA: Diagnosis not present

## 2014-02-27 DIAGNOSIS — N183 Chronic kidney disease, stage 3 (moderate): Secondary | ICD-10-CM | POA: Diagnosis not present

## 2014-02-27 DIAGNOSIS — E1042 Type 1 diabetes mellitus with diabetic polyneuropathy: Secondary | ICD-10-CM | POA: Diagnosis not present

## 2014-02-27 DIAGNOSIS — E663 Overweight: Secondary | ICD-10-CM | POA: Diagnosis not present

## 2014-02-27 DIAGNOSIS — E039 Hypothyroidism, unspecified: Secondary | ICD-10-CM | POA: Diagnosis not present

## 2014-02-27 DIAGNOSIS — E1022 Type 1 diabetes mellitus with diabetic chronic kidney disease: Secondary | ICD-10-CM | POA: Diagnosis not present

## 2014-02-27 DIAGNOSIS — Z23 Encounter for immunization: Secondary | ICD-10-CM | POA: Diagnosis not present

## 2014-02-27 DIAGNOSIS — E10359 Type 1 diabetes mellitus with proliferative diabetic retinopathy without macular edema: Secondary | ICD-10-CM | POA: Diagnosis not present

## 2014-04-13 ENCOUNTER — Ambulatory Visit (INDEPENDENT_AMBULATORY_CARE_PROVIDER_SITE_OTHER): Payer: Managed Care, Other (non HMO) | Admitting: Ophthalmology

## 2014-04-15 ENCOUNTER — Ambulatory Visit (INDEPENDENT_AMBULATORY_CARE_PROVIDER_SITE_OTHER): Payer: Medicare Other | Admitting: Ophthalmology

## 2014-04-15 DIAGNOSIS — H35033 Hypertensive retinopathy, bilateral: Secondary | ICD-10-CM | POA: Diagnosis not present

## 2014-04-15 DIAGNOSIS — I10 Essential (primary) hypertension: Secondary | ICD-10-CM | POA: Diagnosis not present

## 2014-04-15 DIAGNOSIS — H35371 Puckering of macula, right eye: Secondary | ICD-10-CM

## 2014-04-15 DIAGNOSIS — H43813 Vitreous degeneration, bilateral: Secondary | ICD-10-CM | POA: Diagnosis not present

## 2014-04-15 DIAGNOSIS — E10319 Type 1 diabetes mellitus with unspecified diabetic retinopathy without macular edema: Secondary | ICD-10-CM

## 2014-04-15 DIAGNOSIS — E10359 Type 1 diabetes mellitus with proliferative diabetic retinopathy without macular edema: Secondary | ICD-10-CM

## 2014-05-12 DIAGNOSIS — E10359 Type 1 diabetes mellitus with proliferative diabetic retinopathy without macular edema: Secondary | ICD-10-CM | POA: Diagnosis not present

## 2014-05-28 DIAGNOSIS — E1042 Type 1 diabetes mellitus with diabetic polyneuropathy: Secondary | ICD-10-CM | POA: Diagnosis not present

## 2014-05-28 DIAGNOSIS — E10359 Type 1 diabetes mellitus with proliferative diabetic retinopathy without macular edema: Secondary | ICD-10-CM | POA: Diagnosis not present

## 2014-05-28 DIAGNOSIS — E039 Hypothyroidism, unspecified: Secondary | ICD-10-CM | POA: Diagnosis not present

## 2014-05-28 DIAGNOSIS — N183 Chronic kidney disease, stage 3 (moderate): Secondary | ICD-10-CM | POA: Diagnosis not present

## 2014-05-28 DIAGNOSIS — E1022 Type 1 diabetes mellitus with diabetic chronic kidney disease: Secondary | ICD-10-CM | POA: Diagnosis not present

## 2014-05-29 DIAGNOSIS — E1042 Type 1 diabetes mellitus with diabetic polyneuropathy: Secondary | ICD-10-CM | POA: Diagnosis not present

## 2014-08-27 DIAGNOSIS — I10 Essential (primary) hypertension: Secondary | ICD-10-CM | POA: Diagnosis not present

## 2014-08-27 DIAGNOSIS — Z0000 Encounter for general adult medical examination without abnormal findings: Secondary | ICD-10-CM | POA: Diagnosis not present

## 2014-08-27 DIAGNOSIS — M533 Sacrococcygeal disorders, not elsewhere classified: Secondary | ICD-10-CM | POA: Diagnosis not present

## 2014-08-27 DIAGNOSIS — E039 Hypothyroidism, unspecified: Secondary | ICD-10-CM | POA: Diagnosis not present

## 2014-09-04 DIAGNOSIS — E1022 Type 1 diabetes mellitus with diabetic chronic kidney disease: Secondary | ICD-10-CM | POA: Diagnosis not present

## 2014-09-04 DIAGNOSIS — Z794 Long term (current) use of insulin: Secondary | ICD-10-CM | POA: Diagnosis not present

## 2014-09-04 DIAGNOSIS — E039 Hypothyroidism, unspecified: Secondary | ICD-10-CM | POA: Diagnosis not present

## 2014-09-04 DIAGNOSIS — E10359 Type 1 diabetes mellitus with proliferative diabetic retinopathy without macular edema: Secondary | ICD-10-CM | POA: Diagnosis not present

## 2014-09-04 DIAGNOSIS — N183 Chronic kidney disease, stage 3 (moderate): Secondary | ICD-10-CM | POA: Diagnosis not present

## 2014-09-04 DIAGNOSIS — E1042 Type 1 diabetes mellitus with diabetic polyneuropathy: Secondary | ICD-10-CM | POA: Diagnosis not present

## 2014-09-29 ENCOUNTER — Other Ambulatory Visit: Payer: Self-pay

## 2014-09-29 DIAGNOSIS — Z1231 Encounter for screening mammogram for malignant neoplasm of breast: Secondary | ICD-10-CM

## 2014-10-12 DIAGNOSIS — Z780 Asymptomatic menopausal state: Secondary | ICD-10-CM | POA: Diagnosis not present

## 2014-10-13 DIAGNOSIS — H9313 Tinnitus, bilateral: Secondary | ICD-10-CM | POA: Diagnosis not present

## 2014-10-13 DIAGNOSIS — H903 Sensorineural hearing loss, bilateral: Secondary | ICD-10-CM | POA: Diagnosis not present

## 2014-10-20 ENCOUNTER — Ambulatory Visit (INDEPENDENT_AMBULATORY_CARE_PROVIDER_SITE_OTHER): Payer: Medicare Other | Admitting: Ophthalmology

## 2014-10-20 DIAGNOSIS — H35033 Hypertensive retinopathy, bilateral: Secondary | ICD-10-CM

## 2014-10-20 DIAGNOSIS — H43813 Vitreous degeneration, bilateral: Secondary | ICD-10-CM | POA: Diagnosis not present

## 2014-10-20 DIAGNOSIS — E10311 Type 1 diabetes mellitus with unspecified diabetic retinopathy with macular edema: Secondary | ICD-10-CM

## 2014-10-20 DIAGNOSIS — E10359 Type 1 diabetes mellitus with proliferative diabetic retinopathy without macular edema: Secondary | ICD-10-CM

## 2014-10-20 DIAGNOSIS — H35342 Macular cyst, hole, or pseudohole, left eye: Secondary | ICD-10-CM

## 2014-10-20 DIAGNOSIS — I10 Essential (primary) hypertension: Secondary | ICD-10-CM | POA: Diagnosis not present

## 2014-10-20 DIAGNOSIS — E10351 Type 1 diabetes mellitus with proliferative diabetic retinopathy with macular edema: Secondary | ICD-10-CM

## 2014-10-23 DIAGNOSIS — N183 Chronic kidney disease, stage 3 (moderate): Secondary | ICD-10-CM | POA: Diagnosis not present

## 2014-10-27 DIAGNOSIS — N183 Chronic kidney disease, stage 3 (moderate): Secondary | ICD-10-CM | POA: Diagnosis not present

## 2014-10-27 DIAGNOSIS — E1121 Type 2 diabetes mellitus with diabetic nephropathy: Secondary | ICD-10-CM | POA: Diagnosis not present

## 2014-10-27 DIAGNOSIS — Z23 Encounter for immunization: Secondary | ICD-10-CM | POA: Diagnosis not present

## 2014-10-27 DIAGNOSIS — I10 Essential (primary) hypertension: Secondary | ICD-10-CM | POA: Diagnosis not present

## 2014-11-02 DIAGNOSIS — Z1211 Encounter for screening for malignant neoplasm of colon: Secondary | ICD-10-CM | POA: Diagnosis not present

## 2014-11-03 ENCOUNTER — Ambulatory Visit
Admission: RE | Admit: 2014-11-03 | Discharge: 2014-11-03 | Disposition: A | Discharge status: Home / Self Care | Payer: Medicare Other | Source: Ambulatory Visit

## 2014-11-03 DIAGNOSIS — Z1231 Encounter for screening mammogram for malignant neoplasm of breast: Secondary | ICD-10-CM

## 2014-12-09 DIAGNOSIS — E103599 Type 1 diabetes mellitus with proliferative diabetic retinopathy without macular edema, unspecified eye: Secondary | ICD-10-CM | POA: Diagnosis not present

## 2014-12-09 DIAGNOSIS — Z794 Long term (current) use of insulin: Secondary | ICD-10-CM | POA: Diagnosis not present

## 2014-12-09 DIAGNOSIS — E1022 Type 1 diabetes mellitus with diabetic chronic kidney disease: Secondary | ICD-10-CM | POA: Diagnosis not present

## 2014-12-09 DIAGNOSIS — N183 Chronic kidney disease, stage 3 (moderate): Secondary | ICD-10-CM | POA: Diagnosis not present

## 2014-12-09 DIAGNOSIS — E1042 Type 1 diabetes mellitus with diabetic polyneuropathy: Secondary | ICD-10-CM | POA: Diagnosis not present

## 2014-12-09 DIAGNOSIS — E039 Hypothyroidism, unspecified: Secondary | ICD-10-CM | POA: Diagnosis not present

## 2015-03-01 DIAGNOSIS — E1022 Type 1 diabetes mellitus with diabetic chronic kidney disease: Secondary | ICD-10-CM | POA: Diagnosis not present

## 2015-03-01 DIAGNOSIS — Z794 Long term (current) use of insulin: Secondary | ICD-10-CM | POA: Diagnosis not present

## 2015-03-01 DIAGNOSIS — I10 Essential (primary) hypertension: Secondary | ICD-10-CM | POA: Diagnosis not present

## 2015-03-01 DIAGNOSIS — E039 Hypothyroidism, unspecified: Secondary | ICD-10-CM | POA: Diagnosis not present

## 2015-03-04 DIAGNOSIS — I10 Essential (primary) hypertension: Secondary | ICD-10-CM | POA: Diagnosis not present

## 2015-03-04 DIAGNOSIS — E039 Hypothyroidism, unspecified: Secondary | ICD-10-CM | POA: Diagnosis not present

## 2015-03-11 DIAGNOSIS — E1042 Type 1 diabetes mellitus with diabetic polyneuropathy: Secondary | ICD-10-CM | POA: Diagnosis not present

## 2015-03-11 DIAGNOSIS — Z794 Long term (current) use of insulin: Secondary | ICD-10-CM | POA: Diagnosis not present

## 2015-03-11 DIAGNOSIS — E1165 Type 2 diabetes mellitus with hyperglycemia: Secondary | ICD-10-CM | POA: Diagnosis not present

## 2015-03-11 DIAGNOSIS — E1022 Type 1 diabetes mellitus with diabetic chronic kidney disease: Secondary | ICD-10-CM | POA: Diagnosis not present

## 2015-03-11 DIAGNOSIS — N183 Chronic kidney disease, stage 3 (moderate): Secondary | ICD-10-CM | POA: Diagnosis not present

## 2015-03-11 DIAGNOSIS — E103599 Type 1 diabetes mellitus with proliferative diabetic retinopathy without macular edema, unspecified eye: Secondary | ICD-10-CM | POA: Diagnosis not present

## 2015-03-11 DIAGNOSIS — E039 Hypothyroidism, unspecified: Secondary | ICD-10-CM | POA: Diagnosis not present

## 2015-04-19 ENCOUNTER — Ambulatory Visit (INDEPENDENT_AMBULATORY_CARE_PROVIDER_SITE_OTHER): Payer: Medicare Other | Admitting: Ophthalmology

## 2015-04-19 DIAGNOSIS — J069 Acute upper respiratory infection, unspecified: Secondary | ICD-10-CM | POA: Diagnosis not present

## 2015-04-19 DIAGNOSIS — R110 Nausea: Secondary | ICD-10-CM | POA: Diagnosis not present

## 2015-04-19 DIAGNOSIS — R509 Fever, unspecified: Secondary | ICD-10-CM | POA: Diagnosis not present

## 2015-06-08 DIAGNOSIS — E1022 Type 1 diabetes mellitus with diabetic chronic kidney disease: Secondary | ICD-10-CM | POA: Diagnosis not present

## 2015-06-08 DIAGNOSIS — E103599 Type 1 diabetes mellitus with proliferative diabetic retinopathy without macular edema, unspecified eye: Secondary | ICD-10-CM | POA: Diagnosis not present

## 2015-06-08 DIAGNOSIS — N183 Chronic kidney disease, stage 3 (moderate): Secondary | ICD-10-CM | POA: Diagnosis not present

## 2015-06-08 DIAGNOSIS — Z794 Long term (current) use of insulin: Secondary | ICD-10-CM | POA: Diagnosis not present

## 2015-06-08 DIAGNOSIS — E039 Hypothyroidism, unspecified: Secondary | ICD-10-CM | POA: Diagnosis not present

## 2015-06-08 DIAGNOSIS — E1042 Type 1 diabetes mellitus with diabetic polyneuropathy: Secondary | ICD-10-CM | POA: Diagnosis not present

## 2015-06-17 ENCOUNTER — Ambulatory Visit (INDEPENDENT_AMBULATORY_CARE_PROVIDER_SITE_OTHER): Payer: Medicare Other | Admitting: Ophthalmology

## 2015-06-17 DIAGNOSIS — H43813 Vitreous degeneration, bilateral: Secondary | ICD-10-CM

## 2015-06-17 DIAGNOSIS — E10319 Type 1 diabetes mellitus with unspecified diabetic retinopathy without macular edema: Secondary | ICD-10-CM

## 2015-06-17 DIAGNOSIS — I10 Essential (primary) hypertension: Secondary | ICD-10-CM | POA: Diagnosis not present

## 2015-06-17 DIAGNOSIS — E103593 Type 1 diabetes mellitus with proliferative diabetic retinopathy without macular edema, bilateral: Secondary | ICD-10-CM | POA: Diagnosis not present

## 2015-06-17 DIAGNOSIS — H35033 Hypertensive retinopathy, bilateral: Secondary | ICD-10-CM

## 2015-07-06 DIAGNOSIS — D1801 Hemangioma of skin and subcutaneous tissue: Secondary | ICD-10-CM | POA: Diagnosis not present

## 2015-07-06 DIAGNOSIS — L820 Inflamed seborrheic keratosis: Secondary | ICD-10-CM | POA: Diagnosis not present

## 2015-07-06 DIAGNOSIS — L821 Other seborrheic keratosis: Secondary | ICD-10-CM | POA: Diagnosis not present

## 2015-07-06 DIAGNOSIS — L309 Dermatitis, unspecified: Secondary | ICD-10-CM | POA: Diagnosis not present

## 2015-07-06 DIAGNOSIS — D235 Other benign neoplasm of skin of trunk: Secondary | ICD-10-CM | POA: Diagnosis not present

## 2015-07-06 DIAGNOSIS — L814 Other melanin hyperpigmentation: Secondary | ICD-10-CM | POA: Diagnosis not present

## 2015-09-08 DIAGNOSIS — E039 Hypothyroidism, unspecified: Secondary | ICD-10-CM | POA: Diagnosis not present

## 2015-09-08 DIAGNOSIS — E1022 Type 1 diabetes mellitus with diabetic chronic kidney disease: Secondary | ICD-10-CM | POA: Diagnosis not present

## 2015-09-08 DIAGNOSIS — I10 Essential (primary) hypertension: Secondary | ICD-10-CM | POA: Diagnosis not present

## 2015-09-08 DIAGNOSIS — E1042 Type 1 diabetes mellitus with diabetic polyneuropathy: Secondary | ICD-10-CM | POA: Diagnosis not present

## 2015-09-08 DIAGNOSIS — Z0000 Encounter for general adult medical examination without abnormal findings: Secondary | ICD-10-CM | POA: Diagnosis not present

## 2015-09-08 DIAGNOSIS — Z23 Encounter for immunization: Secondary | ICD-10-CM | POA: Diagnosis not present

## 2015-11-02 DIAGNOSIS — I10 Essential (primary) hypertension: Secondary | ICD-10-CM | POA: Diagnosis not present

## 2015-11-02 DIAGNOSIS — Z23 Encounter for immunization: Secondary | ICD-10-CM | POA: Diagnosis not present

## 2015-11-02 DIAGNOSIS — N183 Chronic kidney disease, stage 3 (moderate): Secondary | ICD-10-CM | POA: Diagnosis not present

## 2015-11-15 DIAGNOSIS — E103599 Type 1 diabetes mellitus with proliferative diabetic retinopathy without macular edema, unspecified eye: Secondary | ICD-10-CM | POA: Diagnosis not present

## 2015-11-15 DIAGNOSIS — Z794 Long term (current) use of insulin: Secondary | ICD-10-CM | POA: Diagnosis not present

## 2015-11-15 DIAGNOSIS — N183 Chronic kidney disease, stage 3 (moderate): Secondary | ICD-10-CM | POA: Diagnosis not present

## 2015-11-15 DIAGNOSIS — E039 Hypothyroidism, unspecified: Secondary | ICD-10-CM | POA: Diagnosis not present

## 2015-11-15 DIAGNOSIS — E1022 Type 1 diabetes mellitus with diabetic chronic kidney disease: Secondary | ICD-10-CM | POA: Diagnosis not present

## 2015-11-15 DIAGNOSIS — E1042 Type 1 diabetes mellitus with diabetic polyneuropathy: Secondary | ICD-10-CM | POA: Diagnosis not present

## 2015-12-23 ENCOUNTER — Ambulatory Visit (INDEPENDENT_AMBULATORY_CARE_PROVIDER_SITE_OTHER): Payer: Medicare Other | Admitting: Ophthalmology

## 2015-12-23 DIAGNOSIS — H35033 Hypertensive retinopathy, bilateral: Secondary | ICD-10-CM | POA: Diagnosis not present

## 2015-12-23 DIAGNOSIS — H35373 Puckering of macula, bilateral: Secondary | ICD-10-CM | POA: Diagnosis not present

## 2015-12-23 DIAGNOSIS — H43813 Vitreous degeneration, bilateral: Secondary | ICD-10-CM

## 2015-12-23 DIAGNOSIS — E113593 Type 2 diabetes mellitus with proliferative diabetic retinopathy without macular edema, bilateral: Secondary | ICD-10-CM | POA: Diagnosis not present

## 2015-12-23 DIAGNOSIS — I10 Essential (primary) hypertension: Secondary | ICD-10-CM

## 2015-12-23 DIAGNOSIS — E11319 Type 2 diabetes mellitus with unspecified diabetic retinopathy without macular edema: Secondary | ICD-10-CM

## 2016-01-20 ENCOUNTER — Encounter: Payer: Self-pay | Admitting: Podiatry

## 2016-01-20 ENCOUNTER — Other Ambulatory Visit: Payer: Self-pay | Admitting: *Deleted

## 2016-01-20 ENCOUNTER — Ambulatory Visit (INDEPENDENT_AMBULATORY_CARE_PROVIDER_SITE_OTHER): Payer: Medicare Other | Admitting: Podiatry

## 2016-01-20 VITALS — BP 155/79 | HR 76 | Resp 16

## 2016-01-20 DIAGNOSIS — L600 Ingrowing nail: Secondary | ICD-10-CM | POA: Diagnosis not present

## 2016-01-20 DIAGNOSIS — B351 Tinea unguium: Secondary | ICD-10-CM | POA: Diagnosis not present

## 2016-01-20 NOTE — Progress Notes (Signed)
   Subjective:    Patient ID: Courtney Pearson, female    DOB: February 02, 1950, 65 y.o.   MRN: [id number]  HPI 65 year old female presents the office today for concerns of an ingrown toenail to left big toe which is been ongoing for several months. She states the area is tender times but is currently not painful. She has noticed that her toenail has been growing crooked has becoming thick and discolored. She does not desire any medication for the toenail fungus and to treat this naturally possible. Denies any swelling or redness or any drainage from the nail site. She does trim the toenail which helps some pleasant toenail comes back to become sore again. No other complaints.   Review of Systems  HENT: Positive for tinnitus.   Musculoskeletal: Positive for back pain.  All other systems reviewed and are negative.      Objective:   Physical Exam General: AAO x3, NAD  Dermatological: There is mild incurvation along the lateral portion left hallux toenail distally. The left hallux toenail is dystrophic, discolored in having yellow discoloration. There is hypertrophy the nails well. The nails brittle. There is tenderness on lateral nail border but there is no other areas of tenderness. There is no edema, erythema, drainage or any signs of infection. No open lesions or pre-ulcer lesions identified today.  Vascular: Dorsalis Pedis artery and Posterior Tibial artery pedal pulses are 2/4 bilateral with immedate capillary fill time.  There is no pain with calf compression, swelling, warmth, erythema.   Neruologic: Grossly intact via light touch bilateral. Vibratory intact via tuning fork bilateral. Protective threshold with Semmes Wienstein monofilament intact to all pedal sites bilateral.   Musculoskeletal: No gross boney pedal deformities bilateral. No pain, crepitus, or limitation noted with foot and ankle range of motion bilateral. Muscular strength 5/5 in all groups tested bilateral.  Gait: Unassisted,  Nonantalgic.      Assessment & Plan:  65 year old female left lateral hallux ingrown toenail, onychomycosis/onychodystrophy -Treatment options discussed including all alternatives, risks, and complications -Etiology of symptoms were discussed -Given the nature the toenail I discussed the partial nail avulsion and possibly total nail avulsion. She wanted to hold off on this today. I debrided the toenail fungus and a slantback procedure which is completed so any complications or bleeding. After debridement there was resolution of pain. Discussed that if symptoms continue partial/total nail avulsion. She wishes to hold off any medication for the toenail fungus. I discussed with her tea tree oil.   Celesta Gentile, DPM

## 2016-01-20 NOTE — Patient Instructions (Addendum)
Ingrown Toenail An ingrown toenail occurs when the corner or sides of your toenail grow into the surrounding skin. The big toe is most commonly affected, but it can happen to any of your toes. If your ingrown toenail is not treated, you will be at risk for infection. What are the causes? This condition may be caused by:  Wearing shoes that are too small or tight.  Injury or trauma, such as stubbing your toe or having your toe stepped on.  Improper cutting or care of your toenails.  Being born with (congenital) nail or foot abnormalities, such as having a nail that is too big for your toe. What increases the risk? Risk factors for an ingrown toenail include:  Age. Your nails tend to thicken as you get older, so ingrown nails are more common in older people.  Diabetes.  Cutting your toenails incorrectly.  Blood circulation problems. What are the signs or symptoms? Symptoms may include:  Pain, soreness, or tenderness.  Redness.  Swelling.  Hardening of the skin surrounding the toe. Your ingrown toenail may be infected if there is fluid, pus, or drainage. How is this diagnosed? An ingrown toenail may be diagnosed by medical history and physical exam. If your toenail is infected, your health care provider may test a sample of the drainage. How is this treated? Treatment depends on the severity of your ingrown toenail. Some ingrown toenails may be treated at home. More severe or infected ingrown toenails may require surgery to remove all or part of the nail. Infected ingrown toenails may also be treated with antibiotic medicines. Follow these instructions at home:  If you were prescribed an antibiotic medicine, finish all of it even if you start to feel better.  Soak your foot in warm soapy water for 20 minutes, 3 times per day or as directed by your health care provider.  Carefully lift the edge of the nail away from the sore skin by wedging a small piece of cotton under the  corner of the nail. This may help with the pain. Be careful not to cause more injury to the area.  Wear shoes that fit well. If your ingrown toenail is causing you pain, try wearing sandals, if possible.  Trim your toenails regularly and carefully. Do not cut them in a curved shape. Cut your toenails straight across. This prevents injury to the skin at the corners of the toenail.  Keep your feet clean and dry.  If you are having trouble walking and are given crutches by your health care provider, use them as directed.  Do not pick at your toenail or try to remove it yourself.  Take medicines only as directed by your health care provider.  Keep all follow-up visits as directed by your health care provider. This is important. Contact a health care provider if:  Your symptoms do not improve with treatment. Get help right away if:  You have red streaks that start at your foot and go up your leg.  You have a fever.  You have increased redness, swelling, or pain.  You have fluid, blood, or pus coming from your toenail. This information is not intended to replace advice given to you by your health care provider. Make sure you discuss any questions you have with your health care provider. Document Released: 01/07/2000 Document Revised: 06/11/2015 Document Reviewed: 12/03/2013 Elsevier Interactive Patient Education  2017 [employer]vier Inc.  

## 2016-02-17 DIAGNOSIS — E039 Hypothyroidism, unspecified: Secondary | ICD-10-CM | POA: Diagnosis not present

## 2016-02-17 DIAGNOSIS — E1022 Type 1 diabetes mellitus with diabetic chronic kidney disease: Secondary | ICD-10-CM | POA: Diagnosis not present

## 2016-02-17 DIAGNOSIS — E1042 Type 1 diabetes mellitus with diabetic polyneuropathy: Secondary | ICD-10-CM | POA: Diagnosis not present

## 2016-02-17 DIAGNOSIS — N183 Chronic kidney disease, stage 3 (moderate): Secondary | ICD-10-CM | POA: Diagnosis not present

## 2016-02-17 DIAGNOSIS — E1165 Type 2 diabetes mellitus with hyperglycemia: Secondary | ICD-10-CM | POA: Diagnosis not present

## 2016-02-17 DIAGNOSIS — E103599 Type 1 diabetes mellitus with proliferative diabetic retinopathy without macular edema, unspecified eye: Secondary | ICD-10-CM | POA: Diagnosis not present

## 2016-02-17 DIAGNOSIS — Z794 Long term (current) use of insulin: Secondary | ICD-10-CM | POA: Diagnosis not present

## 2016-05-19 DIAGNOSIS — E1042 Type 1 diabetes mellitus with diabetic polyneuropathy: Secondary | ICD-10-CM | POA: Diagnosis not present

## 2016-05-19 DIAGNOSIS — E103599 Type 1 diabetes mellitus with proliferative diabetic retinopathy without macular edema, unspecified eye: Secondary | ICD-10-CM | POA: Diagnosis not present

## 2016-05-19 DIAGNOSIS — E1022 Type 1 diabetes mellitus with diabetic chronic kidney disease: Secondary | ICD-10-CM | POA: Diagnosis not present

## 2016-05-19 DIAGNOSIS — Z794 Long term (current) use of insulin: Secondary | ICD-10-CM | POA: Diagnosis not present

## 2016-05-19 DIAGNOSIS — N183 Chronic kidney disease, stage 3 (moderate): Secondary | ICD-10-CM | POA: Diagnosis not present

## 2016-05-19 DIAGNOSIS — E039 Hypothyroidism, unspecified: Secondary | ICD-10-CM | POA: Diagnosis not present

## 2016-06-21 ENCOUNTER — Ambulatory Visit (INDEPENDENT_AMBULATORY_CARE_PROVIDER_SITE_OTHER): Payer: Medicare Other | Admitting: Ophthalmology

## 2016-06-21 DIAGNOSIS — E10319 Type 1 diabetes mellitus with unspecified diabetic retinopathy without macular edema: Secondary | ICD-10-CM | POA: Diagnosis not present

## 2016-06-21 DIAGNOSIS — H35343 Macular cyst, hole, or pseudohole, bilateral: Secondary | ICD-10-CM

## 2016-06-21 DIAGNOSIS — I10 Essential (primary) hypertension: Secondary | ICD-10-CM

## 2016-06-21 DIAGNOSIS — H43813 Vitreous degeneration, bilateral: Secondary | ICD-10-CM

## 2016-06-21 DIAGNOSIS — E103593 Type 1 diabetes mellitus with proliferative diabetic retinopathy without macular edema, bilateral: Secondary | ICD-10-CM

## 2016-06-21 DIAGNOSIS — H35372 Puckering of macula, left eye: Secondary | ICD-10-CM

## 2016-06-21 DIAGNOSIS — H35033 Hypertensive retinopathy, bilateral: Secondary | ICD-10-CM | POA: Diagnosis not present

## 2016-07-06 DIAGNOSIS — K120 Recurrent oral aphthae: Secondary | ICD-10-CM | POA: Diagnosis not present

## 2016-08-31 DIAGNOSIS — E1022 Type 1 diabetes mellitus with diabetic chronic kidney disease: Secondary | ICD-10-CM | POA: Diagnosis not present

## 2016-08-31 DIAGNOSIS — Z794 Long term (current) use of insulin: Secondary | ICD-10-CM | POA: Diagnosis not present

## 2016-08-31 DIAGNOSIS — E1042 Type 1 diabetes mellitus with diabetic polyneuropathy: Secondary | ICD-10-CM | POA: Diagnosis not present

## 2016-08-31 DIAGNOSIS — E103599 Type 1 diabetes mellitus with proliferative diabetic retinopathy without macular edema, unspecified eye: Secondary | ICD-10-CM | POA: Diagnosis not present

## 2016-08-31 DIAGNOSIS — N183 Chronic kidney disease, stage 3 (moderate): Secondary | ICD-10-CM | POA: Diagnosis not present

## 2016-08-31 DIAGNOSIS — E039 Hypothyroidism, unspecified: Secondary | ICD-10-CM | POA: Diagnosis not present

## 2016-09-18 DIAGNOSIS — Z0000 Encounter for general adult medical examination without abnormal findings: Secondary | ICD-10-CM | POA: Diagnosis not present

## 2016-09-18 DIAGNOSIS — Z794 Long term (current) use of insulin: Secondary | ICD-10-CM | POA: Diagnosis not present

## 2016-09-18 DIAGNOSIS — I10 Essential (primary) hypertension: Secondary | ICD-10-CM | POA: Diagnosis not present

## 2016-09-18 DIAGNOSIS — Z1159 Encounter for screening for other viral diseases: Secondary | ICD-10-CM | POA: Diagnosis not present

## 2016-09-18 DIAGNOSIS — E1022 Type 1 diabetes mellitus with diabetic chronic kidney disease: Secondary | ICD-10-CM | POA: Diagnosis not present

## 2016-09-18 DIAGNOSIS — Z6829 Body mass index (BMI) 29.0-29.9, adult: Secondary | ICD-10-CM | POA: Diagnosis not present

## 2016-09-18 DIAGNOSIS — Z23 Encounter for immunization: Secondary | ICD-10-CM | POA: Diagnosis not present

## 2016-09-18 DIAGNOSIS — E039 Hypothyroidism, unspecified: Secondary | ICD-10-CM | POA: Diagnosis not present

## 2016-09-21 ENCOUNTER — Other Ambulatory Visit: Payer: Self-pay | Admitting: Family Medicine

## 2016-09-21 DIAGNOSIS — Z1231 Encounter for screening mammogram for malignant neoplasm of breast: Secondary | ICD-10-CM

## 2016-09-27 ENCOUNTER — Ambulatory Visit
Admission: RE | Admit: 2016-09-27 | Discharge: 2016-09-27 | Disposition: A | Discharge status: Home / Self Care | Payer: Medicare Other | Source: Ambulatory Visit | Attending: Family Medicine | Admitting: Family Medicine

## 2016-09-27 DIAGNOSIS — Z1231 Encounter for screening mammogram for malignant neoplasm of breast: Secondary | ICD-10-CM | POA: Diagnosis not present

## 2016-11-23 DIAGNOSIS — R1310 Dysphagia, unspecified: Secondary | ICD-10-CM | POA: Diagnosis not present

## 2016-11-23 DIAGNOSIS — N390 Urinary tract infection, site not specified: Secondary | ICD-10-CM | POA: Diagnosis not present

## 2016-12-11 DIAGNOSIS — N183 Chronic kidney disease, stage 3 (moderate): Secondary | ICD-10-CM | POA: Diagnosis not present

## 2016-12-11 DIAGNOSIS — E039 Hypothyroidism, unspecified: Secondary | ICD-10-CM | POA: Diagnosis not present

## 2016-12-11 DIAGNOSIS — E1042 Type 1 diabetes mellitus with diabetic polyneuropathy: Secondary | ICD-10-CM | POA: Diagnosis not present

## 2016-12-11 DIAGNOSIS — Z794 Long term (current) use of insulin: Secondary | ICD-10-CM | POA: Diagnosis not present

## 2016-12-11 DIAGNOSIS — E103599 Type 1 diabetes mellitus with proliferative diabetic retinopathy without macular edema, unspecified eye: Secondary | ICD-10-CM | POA: Diagnosis not present

## 2016-12-11 DIAGNOSIS — E1022 Type 1 diabetes mellitus with diabetic chronic kidney disease: Secondary | ICD-10-CM | POA: Diagnosis not present

## 2016-12-19 ENCOUNTER — Other Ambulatory Visit: Payer: Self-pay | Admitting: Family Medicine

## 2016-12-19 DIAGNOSIS — R1310 Dysphagia, unspecified: Secondary | ICD-10-CM

## 2016-12-25 ENCOUNTER — Ambulatory Visit
Admission: RE | Admit: 2016-12-25 | Discharge: 2016-12-25 | Disposition: A | Discharge status: Home / Self Care | Payer: Medicare Other | Source: Ambulatory Visit | Attending: Family Medicine | Admitting: Family Medicine

## 2016-12-25 DIAGNOSIS — R1310 Dysphagia, unspecified: Secondary | ICD-10-CM | POA: Diagnosis not present

## 2016-12-27 ENCOUNTER — Ambulatory Visit (INDEPENDENT_AMBULATORY_CARE_PROVIDER_SITE_OTHER): Payer: Medicare Other | Admitting: Ophthalmology

## 2016-12-27 DIAGNOSIS — E10319 Type 1 diabetes mellitus with unspecified diabetic retinopathy without macular edema: Secondary | ICD-10-CM

## 2016-12-27 DIAGNOSIS — H35033 Hypertensive retinopathy, bilateral: Secondary | ICD-10-CM

## 2016-12-27 DIAGNOSIS — I10 Essential (primary) hypertension: Secondary | ICD-10-CM | POA: Diagnosis not present

## 2016-12-27 DIAGNOSIS — H43813 Vitreous degeneration, bilateral: Secondary | ICD-10-CM | POA: Diagnosis not present

## 2016-12-27 DIAGNOSIS — H35342 Macular cyst, hole, or pseudohole, left eye: Secondary | ICD-10-CM | POA: Diagnosis not present

## 2016-12-27 DIAGNOSIS — E103593 Type 1 diabetes mellitus with proliferative diabetic retinopathy without macular edema, bilateral: Secondary | ICD-10-CM | POA: Diagnosis not present

## 2016-12-28 DIAGNOSIS — I129 Hypertensive chronic kidney disease with stage 1 through stage 4 chronic kidney disease, or unspecified chronic kidney disease: Secondary | ICD-10-CM | POA: Diagnosis not present

## 2016-12-28 DIAGNOSIS — N183 Chronic kidney disease, stage 3 (moderate): Secondary | ICD-10-CM | POA: Diagnosis not present

## 2017-01-11 ENCOUNTER — Encounter: Payer: Self-pay | Admitting: Podiatry

## 2017-01-11 ENCOUNTER — Ambulatory Visit (INDEPENDENT_AMBULATORY_CARE_PROVIDER_SITE_OTHER): Payer: Medicare Other

## 2017-01-11 ENCOUNTER — Ambulatory Visit (INDEPENDENT_AMBULATORY_CARE_PROVIDER_SITE_OTHER): Payer: Medicare Other | Admitting: Podiatry

## 2017-01-11 DIAGNOSIS — L84 Corns and callosities: Secondary | ICD-10-CM

## 2017-01-11 DIAGNOSIS — L03116 Cellulitis of left lower limb: Secondary | ICD-10-CM | POA: Diagnosis not present

## 2017-01-11 DIAGNOSIS — B351 Tinea unguium: Secondary | ICD-10-CM

## 2017-01-11 MED ORDER — CEPHALEXIN 500 MG PO CAPS: 500.0000 mg | ORAL_CAPSULE | Freq: Three times a day (TID) | ORAL | 2 refills | Status: DC

## 2017-01-13 NOTE — Progress Notes (Signed)
Subjective: Courtney Pearson presents the office today for concerns of corn to her left fifth toe as well as redness and swelling to the fifth toe to the outside aspect of her left foot.  She states that the corn has come off but the toe has been red and swollen.  She denies any recent injury or trauma.  She denies any drainage or pus.  She said no recent treatment for this.  She is also has no possible nail fungus to her right big toe.  The nails not painful and denies any redness or drainage.  She has no other complaints today. Denies any systemic complaints such as fevers, chills, nausea, vomiting. No acute changes since last appointment, and no other complaints at this time.   Objective: AAO x3, NAD DP/PT pulses palpable bilaterally, CRT less than 3 seconds There is edema to the left fifth toe into the lateral aspect of the foot along the fifth metatarsal head and there is mild erythema with mild increase in warmth to this area but there is no ascending cellulitis.  There is no fluctuation or crepitation.  There is no malodor.  There is very minimal hyperkeratotic tissue to left fifth toe and there is no underlying ulceration, drainage or any signs of infection noted. Nails are mildly hypertrophic with ill-defined discoloration.  There is no pain in the nails there is no surrounding redness or drainage No open lesions or pre-ulcerative lesions.  No pain with calf compression, swelling, warmth, erythema  Assessment: Cellulitis left foot; onychomycosis  Plan: -All treatment options discussed with the patient including all alternatives, risks, complications.  -I was able to debride the very small hyperkeratotic tissue today.  I dispensed a surgical shoe to help take pressure off the area as well.  Prescribed Keflex. Monitor for any clinical signs or symptoms of infection and directed to call the office immediately should any occur or go to the ER. -Discussed treatment options for nail fungus.  Discussed  topical medication. -Patient encouraged to call the office with any questions, concerns, change in symptoms.    Trula Slade DPM

## 2017-01-26 ENCOUNTER — Encounter: Payer: Self-pay | Admitting: Podiatry

## 2017-01-26 ENCOUNTER — Ambulatory Visit (INDEPENDENT_AMBULATORY_CARE_PROVIDER_SITE_OTHER): Payer: Medicare Other | Admitting: Podiatry

## 2017-01-26 DIAGNOSIS — M2140 Flat foot [pes planus] (acquired), unspecified foot: Secondary | ICD-10-CM

## 2017-01-26 DIAGNOSIS — L84 Corns and callosities: Secondary | ICD-10-CM | POA: Diagnosis not present

## 2017-01-26 DIAGNOSIS — M79673 Pain in unspecified foot: Secondary | ICD-10-CM

## 2017-01-29 NOTE — Progress Notes (Signed)
Subjective: Courtney Pearson presents the office today for follow-up evaluation of cellulitis of the left foot.  She states that this is resolved and she is doing much better.  She still gets some occasional tenderness of the corner of the left fifth toe.  She also gets some mild discomfort to the midfoot on both sides.  This is been ongoing for some time and started before the area of the infection of the left foot.  She does wear orthotics in her shoes but there is several years old.  Denies any recent injury or trauma.  She has no other concerns today. Denies any systemic complaints such as fevers, chills, nausea, vomiting. No acute changes since last appointment, and no other complaints at this time.   Objective: AAO x3, NAD DP/PT pulses palpable bilaterally, CRT less than 3 seconds Hammertoe contractures are present.  Small pre-ulcerative area to the left fifth toe but there is minimal hyperkeratotic tissue to this area today.  There is no significant edema there is no erythema identified today the cellulitis appears to be resolved.  There is no area of skin breakdown identified today.  Mild discomfort on the Lisfranc joint on the left side worse than the right.  There is no specific area pinpoint bony tenderness or pain to vibratory sensation.  There is no overlying edema, erythema, increase in warmth identified to this area. No open lesions or pre-ulcerative lesions.  No pain with calf compression, swelling, warmth, erythema  Assessment: Resolved cellulitis left foot; right foot pain likely result of biomechanical changes, arthritis  Plan: -All treatment options discussed with the patient including all alternatives, risks, complications.  -Cellulitis appears to be resolved.  I did dispense an offloading pad to the fifth toe. -I evaluate her shoes and does appear that she is going out quite a bit.  I think this is part of the response to the pain to the outside aspect of her foot as well as to the  midfoot.  I do think she needs a new orthotic.  She can start with a new over-the-counter insert we discussed a custom insert.  If this does not resolve the next couple weeks to call the office or sooner if needed. -Patient encouraged to call the office with any questions, concerns, change in symptoms.   Trula Slade DPM

## 2017-02-19 DIAGNOSIS — J069 Acute upper respiratory infection, unspecified: Secondary | ICD-10-CM | POA: Diagnosis not present

## 2017-02-27 DIAGNOSIS — J209 Acute bronchitis, unspecified: Secondary | ICD-10-CM | POA: Diagnosis not present

## 2017-03-01 ENCOUNTER — Ambulatory Visit
Admission: RE | Admit: 2017-03-01 | Discharge: 2017-03-01 | Disposition: A | Discharge status: Home / Self Care | Payer: Medicare Other | Source: Ambulatory Visit | Attending: Family Medicine | Admitting: Family Medicine

## 2017-03-01 ENCOUNTER — Other Ambulatory Visit: Payer: Self-pay | Admitting: Family Medicine

## 2017-03-01 DIAGNOSIS — R059 Cough, unspecified: Secondary | ICD-10-CM

## 2017-03-01 DIAGNOSIS — R05 Cough: Secondary | ICD-10-CM

## 2017-03-01 DIAGNOSIS — J209 Acute bronchitis, unspecified: Secondary | ICD-10-CM | POA: Diagnosis not present

## 2017-04-03 ENCOUNTER — Encounter: Payer: Self-pay | Admitting: Cardiovascular Disease

## 2017-04-03 ENCOUNTER — Telehealth: Payer: Self-pay | Admitting: Cardiology

## 2017-04-03 ENCOUNTER — Ambulatory Visit (INDEPENDENT_AMBULATORY_CARE_PROVIDER_SITE_OTHER): Payer: Medicare Other | Admitting: Cardiovascular Disease

## 2017-04-03 VITALS — BP 132/68 | HR 87 | Ht 66.0 in | Wt 185.0 lb

## 2017-04-03 DIAGNOSIS — I10 Essential (primary) hypertension: Secondary | ICD-10-CM | POA: Diagnosis not present

## 2017-04-03 DIAGNOSIS — R002 Palpitations: Secondary | ICD-10-CM | POA: Diagnosis not present

## 2017-04-03 DIAGNOSIS — E103599 Type 1 diabetes mellitus with proliferative diabetic retinopathy without macular edema, unspecified eye: Secondary | ICD-10-CM | POA: Diagnosis not present

## 2017-04-03 DIAGNOSIS — E7800 Pure hypercholesterolemia, unspecified: Secondary | ICD-10-CM

## 2017-04-03 DIAGNOSIS — I4819 Other persistent atrial fibrillation: Secondary | ICD-10-CM

## 2017-04-03 DIAGNOSIS — I4892 Unspecified atrial flutter: Secondary | ICD-10-CM | POA: Diagnosis not present

## 2017-04-03 DIAGNOSIS — Z794 Long term (current) use of insulin: Secondary | ICD-10-CM | POA: Diagnosis not present

## 2017-04-03 DIAGNOSIS — E1022 Type 1 diabetes mellitus with diabetic chronic kidney disease: Secondary | ICD-10-CM | POA: Diagnosis not present

## 2017-04-03 DIAGNOSIS — E1042 Type 1 diabetes mellitus with diabetic polyneuropathy: Secondary | ICD-10-CM | POA: Diagnosis not present

## 2017-04-03 DIAGNOSIS — I480 Paroxysmal atrial fibrillation: Secondary | ICD-10-CM | POA: Insufficient documentation

## 2017-04-03 DIAGNOSIS — N183 Chronic kidney disease, stage 3 (moderate): Secondary | ICD-10-CM | POA: Diagnosis not present

## 2017-04-03 DIAGNOSIS — E039 Hypothyroidism, unspecified: Secondary | ICD-10-CM | POA: Diagnosis not present

## 2017-04-03 HISTORY — DX: Other persistent atrial fibrillation: I48.19

## 2017-04-03 MED ORDER — METOPROLOL TARTRATE 25 MG PO TABS: 25.0000 mg | ORAL_TABLET | Freq: Two times a day (BID) | ORAL | 5 refills | Status: DC

## 2017-04-03 MED ORDER — APIXABAN 5 MG PO TABS: 5.0000 mg | ORAL_TABLET | Freq: Two times a day (BID) | ORAL | 5 refills | Status: DC

## 2017-04-03 NOTE — Telephone Encounter (Signed)
Patient seen in clinic today for AFlutter

## 2017-04-03 NOTE — Progress Notes (Signed)
Cardiology Office Note   Date:  04/03/2017   ID:  Courtney Pearson, DOB 16-Nov-1950, MRN [id number]  PCP:  Darcus Austin, MD  Cardiologist:  Dr. Stanford Breed  Chief Complaint  Patient presents with  . Follow-up      History of Present Illness: Courtney Pearson is a 67 y.o. female with hypertension, hyperlipidemia, palpitations and diabetes who presents for management of new onset atrial flutter. Courtney Pearson has a history of palpitations and last saw Dr. Stanford Breed in 2014.  Her palpitations are typically well controlled with propranolol which she takes approximately once or twice per month.  Today she saw her endocrinologist and was noted to be in atrial flutter with ventricular rates in the 120s-140s.  She notes that for the preceding 3 days she is had intermittent palpitations that lasted longer than usual.  When she checked her heart rate on her blood pressure machine it read in the 140s.  She took propranolol with improvement to the 80s.  Courtney Pearson had a prolonged episode of bronchitis 1 month ago.  Her cough persisted for several weeks.  During this time she took several over the counter cold/cough meds with phenylephrine.  She also had an albuterol inhaler.  She had right-sided chest pain with cough and with deep inspiration that she attributed to a pulled muscle.  She has no exertional chest pain or pressure.  She does not get much exercise and therefore has some shortness of breath with exertion that she attributes to limited physical activity.  She has mild chronic lower extremity edema but no orthopnea or PND.  Courtney Pearson snores loudly and reports episodes of apnea.  She feels tired when she awakens in the morning and has daytime somnolence.  She recalls having a sleep study several years ago that reportedly was negative.  She had an echo 02/2007 that revealed LVEF 55-65% and abnormal diastolic function. She had a nuclear stress test 11/20/12 that was negative for ischemia.      Past  Medical History:  Diagnosis Date  . Diabetes ([place])   . HTN (hypertension)   . Hyperlipidemia   . Hypothyroidism   . Palpitation   . Paroxysmal atrial fibrillation ([place]) 04/03/2017  . Renal insufficiency     Past Surgical History:  Procedure Laterality Date  . BREAST BIOPSY    . CESAREAN SECTION    . EYE SURGERY       Current Outpatient Medications  Medication Sig Dispense Refill  . atorvastatin (LIPITOR) 10 MG tablet Take 1 tablet by mouth every other day.     Marland Kitchen CALCIUM PO Take 750 mg by mouth 2 (two) times daily.    . Cholecalciferol (VITAMIN D PO) Take 1,000 mg by mouth daily.    . insulin lispro (HUMALOG) 100 UNIT/ML SOCT Inject 25 Units into the skin.    Marland Kitchen losartan (COZAAR) 25 MG tablet Take 25 mg by mouth daily.    Salley Scarlet FORM[facility]  Onychomycosis Nail Lacquer -  Fluconazole 2%, Terbinafine 1% DMSO Apply to affected nail once daily Qty. 120 gm 3 refills    . NOVOLOG 100 UNIT/ML injection     . propranolol (INDERAL) 10 MG tablet Take 1 tablet (10 mg total) by mouth as needed. 90 tablet 3  . SYNTHROID 75 MCG tablet Take 1 tablet by mouth daily.    Marland Kitchen apixaban (ELIQUIS) 5 MG TABS tablet Take 1 tablet (5 mg total) by mouth 2 (two) times daily. 60 tablet 5  . metoprolol  tartrate (LOPRESSOR) 25 MG tablet Take 1 tablet (25 mg total) by mouth 2 (two) times daily. 60 tablet 5   No current facility-administered medications for this visit.     Allergies:   Patient has no known allergies.    Social History:  The patient  reports that  has never smoked. she has never used smokeless tobacco. She reports that she does not drink alcohol.   Family History:  The patient's family history includes Anemia in her father; Asthma in her sister; Heart disease in her unknown relative; Hypertension in her mother; Leukemia in her brother; Lupus in her child; OCD in her sister; Osteoarthritis in her mother and sister; Osteoporosis in her mother.    ROS:  Please see the history  of present illness.   Otherwise, review of systems are positive for none.   All other systems are reviewed and negative.    PHYSICAL EXAM: VS:  BP 132/68   Pulse 87   Ht 5\' 6"  (1.676 m)   Wt 185 lb (83.9 kg)   BMI 29.86 kg/m  , BMI Body mass index is 29.86 kg/m. GENERAL:  Well appearing HEENT:  Pupils equal round and reactive, fundi not visualized, oral mucosa unremarkable NECK:  No jugular venous distention, waveform within normal limits, carotid upstroke brisk and symmetric, no bruits, no thyromegaly LYMPHATICS:  No cervical adenopathy LUNGS:  Clear to auscultation bilaterally HEART:  Irregularly irregular.   PMI not displaced or sustained,S1 and S2 within normal limits, no S3, no S4, no clicks, no rubs, no murmurs ABD:  Flat, positive bowel sounds normal in frequency in pitch, no bruits, no rebound, no guarding, no midline pulsatile mass, no hepatomegaly, no splenomegaly EXT:  2 plus pulses throughout, no edema, no cyanosis no clubbing SKIN:  No rashes no nodules NEURO:  Cranial nerves II through XII grossly intact, motor grossly intact throughout PSYCH:  Cognitively intact, oriented to person place and time    EKG:  EKG is ordered today. The ekg ordered today demonstrates atrial fibrillation.  Ventricular rate 120 bpm.   Recent Labs: No results found for requested labs within last 8760 hours.   12/11/16: Sodium 140, potassium 4.4, BUN 26, creatinine 1.19 AST 22, ALT 18 Hemoglobin A1c 8.2% Total cholesterol 143, triglycerides 54, HDL 62, LDL 71   Lipid Panel No results found for: CHOL, TRIG, HDL, CHOLHDL, VLDL, LDLCALC, LDLDIRECT    Wt Readings from Last 3 Encounters:  04/03/17 185 lb (83.9 kg)  11/19/12 184 lb (83.5 kg)  10/30/12 186 lb (84.4 kg)      ASSESSMENT AND PLAN:  # Paroxysmal atrial fibrillation: New onset.  Ventricular rate is better after taking propranolol.  We will switch this to metoprolol tartrate 25 mg twice daily.  She will start on Eliquis 5  mg occur today.  Therefore we will not repeat them at this time.  We will refer her for a repeat echocardiogram.  # Hypertension: Blood pressure is well-controlled.  Continue losartan and switch propranolol to metoprolol as above.  # Hyperlipidemia: LDL at goal 11/2016.  Continue atorvastatin.   Current medicines are reviewed at length with the patient today.  The patient does not have concerns regarding medicines.  The following changes have been made: Stop propranolol.  Start metoprolol and Eliquis  Labs/ tests ordered today include:   Orders Placed This Encounter  Procedures  . Amb Referral to AFIB Clinic  . ECHOCARDIOGRAM COMPLETE     Disposition:   FU with atrial fibrillation clinic  in 1 week.  Dr. Stanford Breed in 2 months.     This note was written with the assistance of speech recognition software.  Please excuse any transcriptional errors.  Signed, Sumaiya Arruda C. Oval Linsey, MD, [facility]  04/03/2017 5:04 PM    [place]

## 2017-04-03 NOTE — Patient Instructions (Addendum)
Medication Instructions:  START ELIQUIS 5 MG TWICE A DAY   STOP ASPIRIN   STOP PROPRANOLOL  START METOPROLOL 25 MG TWICE A DAY   Labwork: NONE  Testing/Procedures: Your physician has requested that you have an echocardiogram. Echocardiography is a painless test that uses sound waves to create images of your heart. It provides your doctor with information about the size and shape of your heart and how well your heart's chambers and valves are working. This procedure takes approximately one hour. There are no restrictions for this procedure. [facility] [place] [address]  Follow-Up: Your physician recommends that you schedule a follow-up appointment in:  [place]   Your physician recommends that you schedule a follow-up appointment in: [address] [place]   If you need a refill on your cardiac medications before your next appointment, please call your pharmacy.  Echocardiogram An echocardiogram, or echocardiography, uses sound waves (ultrasound) to produce an image of your heart. The echocardiogram is simple, painless, obtained within a short period of time, and offers valuable information to your health care provider. The images from an echocardiogram can provide information such as:  Evidence of coronary artery disease (CAD).  Heart size.  Heart muscle function.  Heart valve function.  Aneurysm detection.  Evidence of a past heart attack.  Fluid buildup around the heart.  Heart muscle thickening.  Assess heart valve function.  Tell a health care provider about:  Any allergies you have.  All medicines you are taking, including vitamins, herbs, eye drops, creams, and over-the-counter medicines.  Any problems you or family members have had with anesthetic medicines.  Any blood disorders you have.  Any surgeries you have had.  Any medical conditions you have.  Whether you are pregnant or may be pregnant. What happens before the  procedure? No special preparation is needed. Eat and drink normally. What happens during the procedure?  In order to produce an image of your heart, gel will be applied to your chest and a wand-like tool (transducer) will be moved over your chest. The gel will help transmit the sound waves from the transducer. The sound waves will harmlessly bounce off your heart to allow the heart images to be captured in real-time motion. These images will then be recorded.  You may need an IV to receive a medicine that improves the quality of the pictures. What happens after the procedure? You may return to your normal schedule including diet, activities, and medicines, unless your health care provider tells you otherwise. This information is not intended to replace advice given to you by your health care provider. Make sure you discuss any questions you have with your health care provider. Document Released: 01/07/2000 Document Revised: 08/28/2015 Document Reviewed: 09/16/2012 Elsevier Interactive Patient Education  2017 Elsevier Inc.    Atrial Flutter Atrial flutter is a type of abnormal heart rhythm (arrhythmia). In atrial flutter, the heartbeat is fast but regular. There are two types of atrial flutter:  Paroxysmal atrial flutter. This type starts suddenly. It usually stops on its own soon after it starts.  Permanent atrial flutter. This type does not go away.  What are the causes? This condition may be caused by:  A heart condition or problem, such as: ? A heart attack. ? Heart failure. ? A heart valve problem.  A lung problem, such as: ? A blood clot in the lungs (pulmonary embolism, or PE). ? Chronic obstructive pulmonary disease.  Poorly controlled high blood pressure (hypertension).  Hyperthyroidism.  Caffeine.  Some decongestant cold medicines.  Low levels of minerals called electrolytes in the blood.  Cocaine.  What increases the risk? This condition is more likely to  develop in:  Elderly adults.  Men.  What are the signs or symptoms? Symptoms of this condition include:  A feeling that your heart is pounding or racing (palpitations).  Shortness of breath.  Chest pain.  Feeling light-headed.  Dizziness.  Fainting.  How is this diagnosed? This condition may be diagnosed with tests, including:  An electrocardiogram (ECG). This is a painless test that records electrical signals in the heart.  Holter monitoring. For this test, you wear a device that records your heartbeat for 1-2 days.  Cardiac event monitoring. For this test, you wear a device that records your heartbeat for up to 30 days.  An echocardiogram. This is a painless test that uses sound waves to make a picture of your heart.  Stress test. This test records your heartbeat while you exercise.  Blood tests.  How is this treated? This condition may be treated with:  Treatment of any underlying conditions.  Medicine to make your heart beat more slowly.  Medicine to keep the condition from coming back.  A procedure to keep the condition under control. Some procedures to do this include: ? Cardioversion. During this procedure, medicines or an electrical shock are given to make the heart beat normally. ? Ablation. During this procedure, the heart tissue that is causing the problem is destroyed. This procedure may be done if atrial flutter lasts a long time or happens often.  Follow these instructions at home:  Take over-the-counter and prescription medicines only as told by your health care provider.  Do not take any new medicines without talking to your health care provider.  Do not use tobacco products, including cigarettes, chewing tobacco, or e-cigarettes. If you need help quitting, ask your health care provider.  Limit alcohol intake to no more than 1 drink per day for nonpregnant women and 2 drinks per day for men. One drink equals 12 oz of beer, 5 oz of wine, or 1  oz of hard liquor.  Try to reduce any stress. Stress can make your symptoms worse. Contact a health care provider if:  Your symptoms get worse. Get help right away if:  You are dizzy.  You feel like fainting or you faint.  You have shortness of breath.  You feel pain or pressure in your chest.  You suddenly feel nauseous or you suddenly vomit.  There is a sudden change in your ability to speak, eat, or move.  You are sweating a lot for no reason. This information is not intended to replace advice given to you by your health care provider. Make sure you discuss any questions you have with your health care provider. Document Released: 05/28/2008 Document Revised: 05/19/2015 Document Reviewed: 07/24/2014 Elsevier Interactive Patient Education  [employer].

## 2017-04-09 ENCOUNTER — Encounter (HOSPITAL_COMMUNITY): Payer: Self-pay | Admitting: Nurse Practitioner

## 2017-04-09 ENCOUNTER — Ambulatory Visit (HOSPITAL_COMMUNITY)
Admission: RE | Admit: 2017-04-09 | Discharge: 2017-04-09 | Disposition: A | Discharge status: Home / Self Care | Payer: Medicare Other | Source: Ambulatory Visit | Attending: Nurse Practitioner | Admitting: Nurse Practitioner

## 2017-04-09 VITALS — BP 108/64 | HR 78 | Ht 66.0 in | Wt 189.0 lb

## 2017-04-09 DIAGNOSIS — Z79899 Other long term (current) drug therapy: Secondary | ICD-10-CM | POA: Diagnosis not present

## 2017-04-09 DIAGNOSIS — Z9889 Other specified postprocedural states: Secondary | ICD-10-CM | POA: Insufficient documentation

## 2017-04-09 DIAGNOSIS — E785 Hyperlipidemia, unspecified: Secondary | ICD-10-CM | POA: Insufficient documentation

## 2017-04-09 DIAGNOSIS — E119 Type 2 diabetes mellitus without complications: Secondary | ICD-10-CM | POA: Diagnosis not present

## 2017-04-09 DIAGNOSIS — I480 Paroxysmal atrial fibrillation: Secondary | ICD-10-CM | POA: Insufficient documentation

## 2017-04-09 DIAGNOSIS — I10 Essential (primary) hypertension: Secondary | ICD-10-CM | POA: Diagnosis not present

## 2017-04-09 DIAGNOSIS — Z794 Long term (current) use of insulin: Secondary | ICD-10-CM | POA: Diagnosis not present

## 2017-04-09 DIAGNOSIS — I4892 Unspecified atrial flutter: Secondary | ICD-10-CM | POA: Insufficient documentation

## 2017-04-09 DIAGNOSIS — Z7901 Long term (current) use of anticoagulants: Secondary | ICD-10-CM | POA: Diagnosis not present

## 2017-04-09 DIAGNOSIS — E039 Hypothyroidism, unspecified: Secondary | ICD-10-CM | POA: Diagnosis not present

## 2017-04-09 DIAGNOSIS — R002 Palpitations: Secondary | ICD-10-CM | POA: Diagnosis not present

## 2017-04-09 DIAGNOSIS — Z806 Family history of leukemia: Secondary | ICD-10-CM | POA: Diagnosis not present

## 2017-04-09 DIAGNOSIS — Z8249 Family history of ischemic heart disease and other diseases of the circulatory system: Secondary | ICD-10-CM | POA: Diagnosis not present

## 2017-04-09 DIAGNOSIS — N289 Disorder of kidney and ureter, unspecified: Secondary | ICD-10-CM | POA: Insufficient documentation

## 2017-04-09 DIAGNOSIS — Z8261 Family history of arthritis: Secondary | ICD-10-CM | POA: Diagnosis not present

## 2017-04-09 NOTE — Progress Notes (Signed)
Primary Care Physician: Darcus Austin, MD Referring Physician:Dr. Oval Linsey Cardiologist : Dr. Orlean Bradford Courtney Pearson is a 67 y.o. female with a h/o hypertension, hyperlipidemia, palpitations and diabetes who presents for management of new onset atrial flutter. Courtney Pearson has a history of palpitations and last saw Dr. Stanford Breed in 2014.  Her palpitations are typically well controlled with propranolol which she takes approximately once or twice per month.  Today she saw her endocrinologist and was noted to be in atrial flutter with ventricular rates in the 120s-140s.  She notes that for the preceding 3 days she is had intermittent palpitations that lasted longer than usual.  When she checked her heart rate on her blood pressure machine it read in the 140s.  She took propranolol with improvement to the 80s.  Ms. Graff had a prolonged episode of bronchitis 1 month ago.  Her cough persisted for several weeks.  During this time she took several over the counter cold/cough meds with phenylephrine.  She also uses an albuterol inhaler.  She was seen by Dr. Oval Linsey 3/12 and meds were changed.  Today in the afib clinic, pt is in atrial flutter rate controlled, v rates in the 70's. Propanolol has been changed to metoprolol 25 mg bid. She has been placed on eliquis 5 mg bid as of 3/12. ASA was stopped. She has not noted any significant fatigue, shortness of breath, weight gain. Echo is pending. NO significant alcohol, tobacco or caffeine used. She does believe she snores.  Today, she denies symptoms of palpitations, chest pain, shortness of breath, orthopnea, PND, lower extremity edema, dizziness, presyncope, syncope, or neurologic sequela. The patient is tolerating medications without difficulties and is otherwise without complaint today.   Past Medical History:  Diagnosis Date  . Diabetes ([place])   . HTN (hypertension)   . Hyperlipidemia   . Hypothyroidism   . Palpitation   . Paroxysmal atrial  fibrillation ([place]) 04/03/2017  . Renal insufficiency    Past Surgical History:  Procedure Laterality Date  . BREAST BIOPSY    . CESAREAN SECTION    . EYE SURGERY      Current Outpatient Medications  Medication Sig Dispense Refill  . apixaban (ELIQUIS) 5 MG TABS tablet Take 1 tablet (5 mg total) by mouth 2 (two) times daily. 60 tablet 5  . atorvastatin (LIPITOR) 10 MG tablet Take 1 tablet by mouth every other day.     Marland Kitchen CALCIUM PO Take 750 mg by mouth 2 (two) times daily.    . Cholecalciferol (VITAMIN D PO) Take 1,000 mg by mouth daily.    . insulin lispro (HUMALOG) 100 UNIT/ML SOCT Inject 25 Units into the skin.    Marland Kitchen losartan (COZAAR) 25 MG tablet Take 25 mg by mouth daily.    . metoprolol tartrate (LOPRESSOR) 25 MG tablet Take 1 tablet (25 mg total) by mouth 2 (two) times daily. 60 tablet 5  . NON FORMULARY [facility]  Onychomycosis Nail Lacquer -  Fluconazole 2%, Terbinafine 1% DMSO Apply to affected nail once daily Qty. 120 gm 3 refills    . NOVOLOG 100 UNIT/ML injection     . SYNTHROID 75 MCG tablet Take 1 tablet by mouth daily.     No current facility-administered medications for this encounter.     No Known Allergies  Social History   Socioeconomic History  . Marital status: Married    Spouse name: Not on file  . Number of children: 2  . Years of education: Not  on file  . Highest education level: Not on file  Social Needs  . Financial resource strain: Not on file  . Food insecurity - worry: Not on file  . Food insecurity - inability: Not on file  . Transportation needs - medical: Not on file  . Transportation needs - non-medical: Not on file  Occupational History  . Not on file  Tobacco Use  . Smoking status: Never Smoker  . Smokeless tobacco: Never Used  Substance and Sexual Activity  . Alcohol use: No  . Drug use: Not on file  . Sexual activity: Not on file  Other Topics Concern  . Not on file  Social History Narrative  . Not on file     Family History  Problem Relation Age of Onset  . Heart disease Unknown        No family history  . Hypertension Mother   . Osteoarthritis Mother   . Osteoporosis Mother   . Anemia Father   . Asthma Sister   . Osteoarthritis Sister   . OCD Sister   . Leukemia Brother   . Lupus Child     ROS- All systems are reviewed and negative except as per the HPI above  Physical Exam: Vitals:   04/09/17 0908  BP: 108/64  Pulse: 78  SpO2: 97%  Weight: 189 lb (85.7 kg)  Height: 5\' 6"  (1.676 m)   Wt Readings from Last 3 Encounters:  04/09/17 189 lb (85.7 kg)  04/03/17 185 lb (83.9 kg)  11/19/12 184 lb (83.5 kg)    Labs: No results found for: NA, K, CL, CO2, GLUCOSE, BUN, CREATININE, CALCIUM, PHOS, MG No results found for: INR No results found for: CHOL, HDL, LDLCALC, TRIG   GEN- The patient is well appearing, alert and oriented x 3 today.   Head- normocephalic, atraumatic Eyes-  Sclera clear, conjunctiva pink Ears- hearing intact Oropharynx- clear Neck- supple, no JVP Lymph- no cervical lymphadenopathy Lungs- Clear to ausculation bilaterally, normal work of breathing Heart- irregular rate and rhythm, no murmurs, rubs or gallops, PMI not laterally displaced GI- soft, NT, ND, + BS Extremities- no clubbing, cyanosis, or edema MS- no significant deformity or atrophy Skin- no rash or lesion Psych- euthymic mood, full affect Neuro- strength and sensation are intact  EKG- Atypical atrial flutter with controlled v rate,  qrs int 110 ms, qtc int 178 ms Epic records reviewed  Echo- pending   Assessment and Plan: 1.Persistent aflutter General education re fib/flutter  Continue metoprolol at 25 mg bid Triggers discussed She would prefer to address need for sleep study with her PCP  2.Chadsvasc score of 3 Continue eliquis 5 mg bid (started 3/12) Bleeding precautions discussed After on drug x 3 weeks will attempt cardioversion Reminded not to miss any doses  F/u here in  10-14 days  Courtney Pearson Courtney Pearson, [facility] [address] [place], [place] [address] [phone]

## 2017-04-12 ENCOUNTER — Telehealth: Payer: Self-pay

## 2017-04-12 ENCOUNTER — Ambulatory Visit (HOSPITAL_COMMUNITY): Payer: Medicare Other | Attending: Cardiology

## 2017-04-12 ENCOUNTER — Encounter (HOSPITAL_COMMUNITY): Payer: Self-pay | Admitting: *Deleted

## 2017-04-12 ENCOUNTER — Other Ambulatory Visit: Payer: Self-pay

## 2017-04-12 DIAGNOSIS — E119 Type 2 diabetes mellitus without complications: Secondary | ICD-10-CM | POA: Insufficient documentation

## 2017-04-12 DIAGNOSIS — I4892 Unspecified atrial flutter: Secondary | ICD-10-CM

## 2017-04-12 DIAGNOSIS — I10 Essential (primary) hypertension: Secondary | ICD-10-CM | POA: Diagnosis not present

## 2017-04-12 DIAGNOSIS — I4891 Unspecified atrial fibrillation: Secondary | ICD-10-CM | POA: Diagnosis not present

## 2017-04-12 DIAGNOSIS — I313 Pericardial effusion (noninflammatory): Secondary | ICD-10-CM | POA: Insufficient documentation

## 2017-04-12 DIAGNOSIS — E785 Hyperlipidemia, unspecified: Secondary | ICD-10-CM | POA: Insufficient documentation

## 2017-04-12 DIAGNOSIS — R0600 Dyspnea, unspecified: Secondary | ICD-10-CM | POA: Diagnosis not present

## 2017-04-12 MED ORDER — METOPROLOL TARTRATE 50 MG PO TABS: 25.0000 mg | ORAL_TABLET | Freq: Two times a day (BID) | ORAL | 11 refills | Status: DC

## 2017-04-12 MED ORDER — METOPROLOL TARTRATE 50 MG PO TABS: 50.0000 mg | ORAL_TABLET | Freq: Two times a day (BID) | ORAL | 11 refills | Status: DC

## 2017-04-12 NOTE — Progress Notes (Unsigned)
Mrs. Stogner arrives for her echocardiogram in atrial fibrillation HR 130's-150's.  After consultation with Dr Burt Knack (DOD) he is working to adjust medications/appointments and the 2D echo was still performed based on his recommendation.    Deliah Boston, RDCS

## 2017-04-12 NOTE — Telephone Encounter (Signed)
Patient was in today for echocardiogram. Tech reviewed with Dr. Burt Knack (DOD) because HR was in the 130-150 range and in afib. BP= 134/99 Patient asymptomatic.  Per Dr. Burt Knack, instructed patient to: 1) STOP LOSARTAN 2) INCREASE METOPROLOL to 50 mg BID 3) Scheduled patient 3/26 for follow-up 4) Instructed patient to keep appointment with Roderic Palau, NP next Friday. Patient agrees with treatment plan and was grateful for assistance.

## 2017-04-16 NOTE — Progress Notes (Signed)
Cardiology Office Note    Date:  04/17/2017   ID:  Courtney Pearson, DOB 1950/09/08, MRN [id number]  PCP:  Courtney Austin, MD  Cardiologist:  Dr. Stanford Breed  Chief Complaint: Atrial fibrillation  History of Present Illness:   Courtney Pearson is a 67 y.o. female with hypertension, hyperlipidemia, palpitations, diabetes and recent new onset atrial fib/flutter presents for elevated rate.   [name] has a history of palpitations and last saw Dr. Stanford Breed in 2014.  Her palpitations are typically well controlled with propranolol which she takes approximately once or twice per month  Seen by Dr. Oval Linsey after she found to be atrial fib/flutter during office visit with endocrinologist. Started on metoprolol tartrate 25 mg twice daily and Eliquis 5 mg BID.   Echo 04/12/17 showed LVEF of 55-60%, Small pericardial effusion. Mildly elevated pulmonary pressures. During echo her HR noted in 130-150s range.  Dr. Burt Knack (DOD) instructed patient to: 1) STOP LOSARTAN 2) INCREASE METOPROLOL to 50 mg BID  Here today for follow up.  Heart rate intermittently goes up to 140s on pulse oximeter.  She is fatigued and tired.  Noted orthopnea without PND.  She also has lower extremity edema.  He has gained 4 pounds in the past few weeks.  Denies chest pain, shortness of breath, melena or blood in her urine or stool.  Past Medical History:  Diagnosis Date  . Diabetes ([place])   . HTN (hypertension)   . Hyperlipidemia   . Hypothyroidism   . Palpitation   . Paroxysmal atrial fibrillation ([place]) 04/03/2017  . Renal insufficiency     Past Surgical History:  Procedure Laterality Date  . BREAST BIOPSY    . CESAREAN SECTION    . EYE SURGERY      Current Medications: Prior to Admission medications   Medication Sig Start Date End Date Taking? Authorizing Provider  apixaban (ELIQUIS) 5 MG TABS tablet Take 1 tablet (5 mg total) by mouth 2 (two) times daily. 04/03/17   [name], MD  atorvastatin (LIPITOR) 10  MG tablet Take 1 tablet by mouth every other day.  09/20/12   [provider]  CALCIUM PO Take 750 mg by mouth 2 (two) times daily.    [provider]  Cholecalciferol (VITAMIN D PO) Take 1,000 mg by mouth daily.    [provider]  insulin lispro (HUMALOG) 100 UNIT/ML SOCT Inject 25 Units into the skin.    [provider]  metoprolol tartrate (LOPRESSOR) 50 MG tablet Take 1 tablet (50 mg total) by mouth 2 (two) times daily. 04/12/17 04/07/18  Sherren Mocha, MD  NON [facility]  Onychomycosis Nail Lacquer -  Fluconazole 2%, Terbinafine 1% DMSO Apply to affected nail once daily Qty. 120 gm 3 refills    [provider]  NOVOLOG 100 UNIT/ML injection  11/10/15   [provider]  SYNTHROID 75 MCG tablet Take 1 tablet by mouth daily. 08/05/12   [provider]    Allergies:   Patient has no known allergies.   Social History   Socioeconomic History  . Marital status: Married    Spouse name: Not on file  . Number of children: 2  . Years of education: Not on file  . Highest education level: Not on file  Occupational History  . Not on file  Social Needs  . Financial resource strain: Not on file  . Food insecurity:    Worry: Not on file    Inability: Not on file  . Transportation  needs:    Medical: Not on file    Non-medical: Not on file  Tobacco Use  . Smoking status: Never Smoker  . Smokeless tobacco: Never Used  Substance and Sexual Activity  . Alcohol use: No  . Drug use: Not on file  . Sexual activity: Not on file  Lifestyle  . Physical activity:    Days per week: Not on file    Minutes per session: Not on file  . Stress: Not on file  Relationships  . Social connections:    Talks on phone: Not on file    Gets together: Not on file    Attends religious service: Not on file    Active member of club or organization: Not on file    Attends meetings of clubs or organizations: Not on file     Relationship status: Not on file  Other Topics Concern  . Not on file  Social History Narrative  . Not on file     Family History:  The patient's family history includes Anemia in her father; Asthma in her sister; Heart disease in her unknown relative; Hypertension in her mother; Leukemia in her brother; Lupus in her child; OCD in her sister; Osteoarthritis in her mother and sister; Osteoporosis in her mother.   ROS:   Please see the history of present illness.    ROS All other systems reviewed and are negative.   PHYSICAL EXAM:   VS:  BP 118/72   Pulse (!) 101   Ht 5\' 6"  (1.676 m)   Wt 190 lb 12.8 oz (86.5 kg)   SpO2 98%   BMI 30.80 kg/m    GEN: Well nourished, well developed, in no acute distress  HEENT: normal  Neck: no JVD, carotid bruits, or masses Cardiac: Irregularly irregular; no murmurs, rubs, or gallops, 1-2+ bilateral lower extremity edema  Respiratory:  clear to auscultation bilaterally, normal work of breathing GI: soft, nontender, nondistended, + BS MS: no deformity or atrophy  Skin: warm and dry, no rash Neuro:  Alert and Oriented x 3, Strength and sensation are intact Psych: euthymic mood, full affect  Wt Readings from Last 3 Encounters:  04/17/17 190 lb 12.8 oz (86.5 kg)  04/09/17 189 lb (85.7 kg)  04/03/17 185 lb (83.9 kg)      Studies/Labs Reviewed:   EKG:  EKG is ordered today.  The ekg ordered today demonstrates atrial fibrillation/flutter at rate of 101 bpm  Recent Labs: No results found for requested labs within last 8760 hours.   Lipid Panel No results found for: CHOL, TRIG, HDL, CHOLHDL, VLDL, LDLCALC, LDLDIRECT  Additional studies/ records that were reviewed today include:   Study Conclusions  - Left ventricle: The cavity size was normal. Wall thickness was   normal. Systolic function was normal. The estimated ejection   fraction was in the range of 55% to 60%. Wall motion was normal;   there were no regional wall motion  abnormalities. The study is   not technically sufficient to allow evaluation of LV diastolic   function. - Aortic valve: Trileaflet; mildly thickened, mildly calcified   leaflets. - Mitral valve: Calcified annulus. Mildly thickened leaflets . - Pulmonary arteries: Systolic pressure was mildly to moderately   increased. PA peak pressure: 46 mm Hg (S). - Pericardium, extracardiac: A small pericardial effusion was   identified circumferential to the heart, mostly along the   posterior and right ventricular free wall. There was no evidence   of hemodynamic compromise  ASSESSMENT & PLAN:    1. Persistent atrial fibrillation/flutter -Rate improved but still goes to 140 intermittently.  Echocardiogram showed normal LV function with mild to moderate pulmonary pressure.  Will increase metoprolol further to 75 mg twice daily.  Continue Eliquis for anticoagulation.  She has not missed any dose.  Compliant with medication.  Follow-up in A. fib clinic as scheduled.  She can have a cardioversion next week.  May consider antiarrhythmic if still difficult to control heart rate.  2. HTN -Stable on current medication.  Increase beta-blocker as above.  3. HLD -Continue statin.  LDL 71 on November 2018.  4.  Acute diastolic heart failure -Likely due to elevated heart rate.  She has gained 4 pounds in past few weeks.  Noted orthopnea and lower extremity edema. Start low-dose Lasix with supplemental potassium.  Consider BMET during follow-up.     Medication Adjustments/Labs and Tests Ordered: Current medicines are reviewed at length with the patient today.  Concerns regarding medicines are outlined above.  Medication changes, Labs and Tests ordered today are listed in the Patient Instructions below. Patient Instructions  Medication Instructions:  Please increase your Metoprolol to 75 mg (1 and 1/2 tablets) twice a day. Start Furosemide 20 mg a day and Potassium Chloride 10 MEQ daily. Continue all  other medications as listed.  Follow-Up: Follow up as scheduled with At [facility].  If you need a refill on your cardiac medications before your next appointment, please call your pharmacy.  Thank you for choosing [facility]!!        Jarrett Soho, [place]  04/17/2017 11:43 AM    [address], [place], [place]  [address] Phone: [phone]; Fax: [phone]

## 2017-04-17 ENCOUNTER — Ambulatory Visit (INDEPENDENT_AMBULATORY_CARE_PROVIDER_SITE_OTHER): Payer: Medicare Other | Admitting: Physician Assistant

## 2017-04-17 ENCOUNTER — Encounter: Payer: Self-pay | Admitting: Physician Assistant

## 2017-04-17 VITALS — BP 118/72 | HR 101 | Ht 66.0 in | Wt 190.8 lb

## 2017-04-17 DIAGNOSIS — I5031 Acute diastolic (congestive) heart failure: Secondary | ICD-10-CM

## 2017-04-17 DIAGNOSIS — I4819 Other persistent atrial fibrillation: Secondary | ICD-10-CM

## 2017-04-17 DIAGNOSIS — I481 Persistent atrial fibrillation: Secondary | ICD-10-CM

## 2017-04-17 DIAGNOSIS — I10 Essential (primary) hypertension: Secondary | ICD-10-CM

## 2017-04-17 DIAGNOSIS — I4892 Unspecified atrial flutter: Secondary | ICD-10-CM | POA: Diagnosis not present

## 2017-04-17 DIAGNOSIS — E7800 Pure hypercholesterolemia, unspecified: Secondary | ICD-10-CM

## 2017-04-17 MED ORDER — POTASSIUM CHLORIDE ER 10 MEQ PO TBCR: 10.0000 meq | EXTENDED_RELEASE_TABLET | Freq: Every day | ORAL | 6 refills | Status: DC

## 2017-04-17 MED ORDER — FUROSEMIDE 20 MG PO TABS: 20.0000 mg | ORAL_TABLET | Freq: Every day | ORAL | 6 refills | Status: DC

## 2017-04-17 MED ORDER — METOPROLOL TARTRATE 50 MG PO TABS: 75.0000 mg | ORAL_TABLET | Freq: Two times a day (BID) | ORAL | 11 refills | Status: DC

## 2017-04-17 NOTE — Patient Instructions (Signed)
Medication Instructions:  Please increase your Metoprolol to 75 mg (1 and 1/2 tablets) twice a day. Start Furosemide 20 mg a day and Potassium Chloride 10 MEQ daily. Continue all other medications as listed.  Follow-Up: Follow up as scheduled with At [facility].  If you need a refill on your cardiac medications before your next appointment, please call your pharmacy.  Thank you for choosing [place]!!

## 2017-04-20 ENCOUNTER — Other Ambulatory Visit: Payer: Self-pay

## 2017-04-20 ENCOUNTER — Ambulatory Visit (HOSPITAL_COMMUNITY)
Admission: RE | Admit: 2017-04-20 | Discharge: 2017-04-20 | Disposition: A | Discharge status: Home / Self Care | Payer: Medicare Other | Source: Ambulatory Visit | Attending: Nurse Practitioner | Admitting: Nurse Practitioner

## 2017-04-20 ENCOUNTER — Encounter (HOSPITAL_COMMUNITY): Payer: Self-pay | Admitting: Nurse Practitioner

## 2017-04-20 ENCOUNTER — Other Ambulatory Visit (HOSPITAL_COMMUNITY): Payer: Self-pay | Admitting: *Deleted

## 2017-04-20 VITALS — BP 112/66 | HR 78 | Ht 66.0 in | Wt 190.4 lb

## 2017-04-20 DIAGNOSIS — Z794 Long term (current) use of insulin: Secondary | ICD-10-CM | POA: Diagnosis not present

## 2017-04-20 DIAGNOSIS — I4892 Unspecified atrial flutter: Secondary | ICD-10-CM | POA: Insufficient documentation

## 2017-04-20 DIAGNOSIS — I481 Persistent atrial fibrillation: Secondary | ICD-10-CM | POA: Diagnosis not present

## 2017-04-20 DIAGNOSIS — Z79890 Hormone replacement therapy (postmenopausal): Secondary | ICD-10-CM | POA: Diagnosis not present

## 2017-04-20 DIAGNOSIS — I10 Essential (primary) hypertension: Secondary | ICD-10-CM | POA: Diagnosis not present

## 2017-04-20 DIAGNOSIS — Z79899 Other long term (current) drug therapy: Secondary | ICD-10-CM | POA: Diagnosis not present

## 2017-04-20 DIAGNOSIS — I480 Paroxysmal atrial fibrillation: Secondary | ICD-10-CM | POA: Diagnosis not present

## 2017-04-20 DIAGNOSIS — Z7901 Long term (current) use of anticoagulants: Secondary | ICD-10-CM | POA: Diagnosis not present

## 2017-04-20 DIAGNOSIS — E119 Type 2 diabetes mellitus without complications: Secondary | ICD-10-CM | POA: Diagnosis not present

## 2017-04-20 DIAGNOSIS — E039 Hypothyroidism, unspecified: Secondary | ICD-10-CM | POA: Diagnosis not present

## 2017-04-20 DIAGNOSIS — I4819 Other persistent atrial fibrillation: Secondary | ICD-10-CM

## 2017-04-20 DIAGNOSIS — I4891 Unspecified atrial fibrillation: Secondary | ICD-10-CM | POA: Diagnosis present

## 2017-04-20 DIAGNOSIS — E785 Hyperlipidemia, unspecified: Secondary | ICD-10-CM | POA: Insufficient documentation

## 2017-04-20 DIAGNOSIS — R002 Palpitations: Secondary | ICD-10-CM | POA: Insufficient documentation

## 2017-04-20 LAB — CBC
HEMATOCRIT: 35.5 % — AB (ref 36.0–46.0)
HEMOGLOBIN: 11.3 g/dL — AB (ref 12.0–15.0)
MCH: 29.7 pg (ref 26.0–34.0)
MCHC: 31.8 g/dL (ref 30.0–36.0)
MCV: 93.2 fL (ref 78.0–100.0)
Platelets: 200 10*3/uL (ref 150–400)
RBC: 3.81 MIL/uL — ABNORMAL LOW (ref 3.87–5.11)
RDW: 15.6 % — ABNORMAL HIGH (ref 11.5–15.5)
WBC: 4.9 10*3/uL (ref 4.0–10.5)

## 2017-04-20 LAB — COMPREHENSIVE METABOLIC PANEL
ALK PHOS: 263 U/L — AB (ref 38–126)
ALT: 80 U/L — AB (ref 14–54)
AST: 67 U/L — ABNORMAL HIGH (ref 15–41)
Albumin: 3.4 g/dL — ABNORMAL LOW (ref 3.5–5.0)
Anion gap: 6 (ref 5–15)
BUN: 25 mg/dL — ABNORMAL HIGH (ref 6–20)
CALCIUM: 9.3 mg/dL (ref 8.9–10.3)
CO2: 27 mmol/L (ref 22–32)
CREATININE: 1.4 mg/dL — AB (ref 0.44–1.00)
Chloride: 105 mmol/L (ref 101–111)
GFR calc Af Amer: 44 mL/min — ABNORMAL LOW (ref >60)
GFR calc non Af Amer: 38 mL/min — ABNORMAL LOW (ref >60)
GLUCOSE: 260 mg/dL — AB (ref 65–99)
Potassium: 4.5 mmol/L (ref 3.5–5.1)
SODIUM: 138 mmol/L (ref 135–145)
Total Bilirubin: 1.1 mg/dL (ref 0.3–1.2)
Total Protein: 5.9 g/dL — ABNORMAL LOW (ref 6.5–8.1)

## 2017-04-20 LAB — TSH: TSH: 4.906 u[IU]/mL — ABNORMAL HIGH (ref 0.350–4.500)

## 2017-04-20 LAB — T4, FREE: Free T4: 1.5 ng/dL — ABNORMAL HIGH (ref 0.61–1.12)

## 2017-04-20 MED ORDER — FUROSEMIDE 20 MG PO TABS: 20.0000 mg | ORAL_TABLET | ORAL | 6 refills | Status: DC

## 2017-04-20 NOTE — Patient Instructions (Addendum)
Cardioversion scheduled for Monday, April 8th  - Arrive at the [facility] and go to admitting at 9:30AM  -Do not eat or drink anything after midnight the night prior to your procedure.  - Take all your morning medications with a sip of water prior to arrival.  - You will not be able to drive home after your procedure.  On Morning of cardioversion decrease metoprolol to 25mg  twice a day -- continue this until follow-up

## 2017-04-20 NOTE — H&P (View-Only) (Signed)
Primary Care Physician: Darcus Austin, MD Referring Physician:Dr. Oval Linsey Cardiologist : Dr. Orlean Bradford Height is a 67 y.o. female with a h/o hypertension, hyperlipidemia, palpitations and diabetes who presents for management of new onset atrial flutter. Courtney Pearson has a history of palpitations and last saw Dr. Stanford Breed in 2014.  Her palpitations are typically well controlled with propranolol which she takes approximately once or twice per month.  Today she saw her endocrinologist and was noted to be in atrial flutter with ventricular rates in the 120s-140s.  She notes that for the preceding 3 days she is had intermittent palpitations that lasted longer than usual.  When she checked her heart rate on her blood pressure machine it read in the 140s.  She took propranolol with improvement to the 80s.  Courtney Pearson had a prolonged episode of bronchitis 1 month ago.  Her cough persisted for several weeks.  During this time she took several over the counter cold/cough meds with phenylephrine.  She also uses an albuterol inhaler.  She was seen by Dr. Oval Linsey 3/12 and meds were changed.  Today in the afib clinic, pt is in atrial flutter rate controlled, v rates in the 70's. Propanolol has been changed to metoprolol 25 mg bid. She has been placed on eliquis 5 mg bid as of 3/12. ASA was stopped. She has not noted any significant fatigue, shortness of breath, weight gain. Echo is pending. NO significant alcohol, tobacco or caffeine used. She does believe she snores.  F/u in afib clinic, 3/29. Since I saw her last, she has had increase of metoprolol for RVR from metoprolol  25 mg bid to 75 mg bid. She is now rate controlled in the 70's. She will be eligible for cardioversion after being on eliquis x 3 weeks , after 4/3. Still has c/o of fatigue.  Today, she denies symptoms of palpitations, chest pain, shortness of breath, orthopnea, PND, lower extremity edema, dizziness, presyncope, syncope, or  neurologic sequela. + fatigue.The patient is tolerating medications without difficulties and is otherwise without complaint today.   Past Medical History:  Diagnosis Date  . Diabetes ([place])   . HTN (hypertension)   . Hyperlipidemia   . Hypothyroidism   . Palpitation   . Paroxysmal atrial fibrillation ([place]) 04/03/2017  . Renal insufficiency    Past Surgical History:  Procedure Laterality Date  . BREAST BIOPSY    . CESAREAN SECTION    . EYE SURGERY      Current Outpatient Medications  Medication Sig Dispense Refill  . apixaban (ELIQUIS) 5 MG TABS tablet Take 1 tablet (5 mg total) by mouth 2 (two) times daily. 60 tablet 5  . atorvastatin (LIPITOR) 10 MG tablet Take 1 tablet by mouth every other day.     Marland Kitchen CALCIUM PO Take 750 mg by mouth 2 (two) times daily.    . Cholecalciferol (VITAMIN D PO) Take 1,000 mg by mouth daily.    . furosemide (LASIX) 20 MG tablet Take 1 tablet (20 mg total) by mouth daily. 30 tablet 6  . insulin lispro (HUMALOG) 100 UNIT/ML SOCT Inject 25 Units into the skin.    . metoprolol tartrate (LOPRESSOR) 50 MG tablet Take 1.5 tablets (75 mg total) by mouth 2 (two) times daily. 90 tablet 11  . NON FORMULARY [facility]  Onychomycosis Nail Lacquer -  Fluconazole 2%, Terbinafine 1% DMSO Apply to affected nail once daily Qty. 120 gm 3 refills    . potassium chloride (K-DUR) 10 MEQ tablet  Take 1 tablet (10 mEq total) by mouth daily. 30 tablet 6  . SYNTHROID 75 MCG tablet Take 1 tablet by mouth daily.     No current facility-administered medications for this encounter.     No Known Allergies  Social History   Socioeconomic History  . Marital status: Married    Spouse name: Not on file  . Number of children: 2  . Years of education: Not on file  . Highest education level: Not on file  Occupational History  . Not on file  Social Needs  . Financial resource strain: Not on file  . Food insecurity:    Worry: Not on file    Inability: Not on file  .  Transportation needs:    Medical: Not on file    Non-medical: Not on file  Tobacco Use  . Smoking status: Never Smoker  . Smokeless tobacco: Never Used  Substance and Sexual Activity  . Alcohol use: No  . Drug use: Not on file  . Sexual activity: Not on file  Lifestyle  . Physical activity:    Days per week: Not on file    Minutes per session: Not on file  . Stress: Not on file  Relationships  . Social connections:    Talks on phone: Not on file    Gets together: Not on file    Attends religious service: Not on file    Active member of club or organization: Not on file    Attends meetings of clubs or organizations: Not on file    Relationship status: Not on file  . Intimate partner violence:    Fear of current or ex partner: Not on file    Emotionally abused: Not on file    Physically abused: Not on file    Forced sexual activity: Not on file  Other Topics Concern  . Not on file  Social History Narrative  . Not on file    Family History  Problem Relation Age of Onset  . Heart disease Unknown        No family history  . Hypertension Mother   . Osteoarthritis Mother   . Osteoporosis Mother   . Anemia Father   . Asthma Sister   . Osteoarthritis Sister   . OCD Sister   . Leukemia Brother   . Lupus Child     ROS- All systems are reviewed and negative except as per the HPI above  Physical Exam: Vitals:   04/20/17 1106  BP: 112/66  Pulse: (!) 139  Weight: 190 lb 6.4 oz (86.4 kg)  Height: 5\' 6"  (1.676 m)   Wt Readings from Last 3 Encounters:  04/20/17 190 lb 6.4 oz (86.4 kg)  04/17/17 190 lb 12.8 oz (86.5 kg)  04/09/17 189 lb (85.7 kg)    Labs: Lab Results  Component Value Date   NA 138 04/20/2017   K 4.5 04/20/2017   CL 105 04/20/2017   CO2 27 04/20/2017   GLUCOSE 260 (H) 04/20/2017   BUN 25 (H) 04/20/2017   CREATININE 1.40 (H) 04/20/2017   CALCIUM 9.3 04/20/2017   No results found for: INR No results found for: CHOL, HDL, LDLCALC, TRIG   GEN-  The patient is well appearing, alert and oriented x 3 today.   Head- normocephalic, atraumatic Eyes-  Sclera clear, conjunctiva pink Ears- hearing intact Oropharynx- clear Neck- supple, no JVP Lymph- no cervical lymphadenopathy Lungs- Clear to ausculation bilaterally, normal work of breathing Heart- irregular rate and rhythm,  no murmurs, rubs or gallops, PMI not laterally displaced GI- soft, NT, ND, + BS Extremities- no clubbing, cyanosis, or edema MS- no significant deformity or atrophy Skin- no rash or lesion Psych- euthymic mood, full affect Neuro- strength and sensation are intact  EKG- Atypical atrial flutter with controlled v rate, 78 bpm  qrs int 74 ms, qtc int 483 ms Epic records reviewed  Echo- Study Conclusions  - Left ventricle: The cavity size was normal. Wall thickness was   normal. Systolic function was normal. The estimated ejection   fraction was in the range of 55% to 60%. Wall motion was normal;   there were no regional wall motion abnormalities. The study is   not technically sufficient to allow evaluation of LV diastolic   function. - Aortic valve: Trileaflet; mildly thickened, mildly calcified   leaflets. - Mitral valve: Calcified annulus. Mildly thickened leaflets . - Pulmonary arteries: Systolic pressure was mildly to moderately   increased. PA peak pressure: 46 mm Hg (S). - Pericardium, extracardiac: A small pericardial effusion was   identified circumferential to the heart, mostly along the   posterior and right ventricular free wall. There was no evidence   of hemodynamic compromise.    Assessment and Plan: 1.Persistent aflutter Cardioversion scheduled for 4/8 with Dr. Oval Linsey Risk vrs benefit of cardioversion discussed Continue metoprolol at 75 mg bid, but reduce to 25 mg am of cardioversion as EKG in SR is in the low 60's Triggers discussed She would prefer to address need for sleep study with her PCP Cmet,tsh,cbc today  2.Chadsvasc score  of 3 Continue eliquis 5 mg bid (started 3/12) States no missed doses Reminded not to miss any doses  F/u here one week after cardioversion Dr. Stanford Breed 5/24  Geroge Baseman. Shenique Childers, [facility] [address] [place], [place] [address] [phone]

## 2017-04-20 NOTE — Progress Notes (Signed)
Primary Care Physician: Darcus Austin, MD Referring Physician:Dr. Oval Linsey Cardiologist : Dr. Orlean Bradford Courtney Pearson is a 67 y.o. female with a h/o hypertension, hyperlipidemia, palpitations and diabetes who presents for management of new onset atrial flutter. Courtney Pearson has a history of palpitations and last saw Dr. Stanford Breed in 2014.  Her palpitations are typically well controlled with propranolol which she takes approximately once or twice per month.  Today she saw her endocrinologist and was noted to be in atrial flutter with ventricular rates in Courtney 120s-140s.  She notes that for Courtney preceding 3 days she is had intermittent palpitations that lasted longer than usual.  When she checked her heart rate on her blood pressure machine it read in Courtney 140s.  She took propranolol with improvement to Courtney 80s.  Courtney Pearson had a prolonged episode of bronchitis 1 month ago.  Her cough persisted for several weeks.  During this time she took several over Courtney counter cold/cough meds with phenylephrine.  She also uses an albuterol inhaler.  She was seen by Dr. Oval Linsey 3/12 and meds were changed.  Today in [facility] afib clinic, pt is in atrial flutter rate controlled, v rates in Courtney 70's. Propanolol has been changed to metoprolol 25 mg bid. She has been placed on eliquis 5 mg bid as of 3/12. ASA was stopped. She has not noted any significant fatigue, shortness of breath, weight gain. Echo is pending. NO significant alcohol, tobacco or caffeine used. She does believe she snores.  F/u in afib clinic, 3/29. Since I saw her last, she has had increase of metoprolol for RVR from metoprolol  25 mg bid to 75 mg bid. She is now rate controlled in Courtney 70's. She will be eligible for cardioversion after being on eliquis x 3 weeks , after 4/3. Still has c/o of fatigue.  Today, she denies symptoms of palpitations, chest pain, shortness of breath, orthopnea, PND, lower extremity edema, dizziness, presyncope, syncope, or  neurologic sequela. + fatigue.Courtney Pearson is tolerating medications without difficulties and is otherwise without complaint today.   Past Medical History:  Diagnosis Date  . Diabetes ([place])   . HTN (hypertension)   . Hyperlipidemia   . Hypothyroidism   . Palpitation   . Paroxysmal atrial fibrillation ([place]) 04/03/2017  . Renal insufficiency    Past Surgical History:  Procedure Laterality Date  . BREAST BIOPSY    . CESAREAN SECTION    . EYE SURGERY      Current Outpatient Medications  Medication Sig Dispense Refill  . apixaban (ELIQUIS) 5 MG TABS tablet Take 1 tablet (5 mg total) by mouth 2 (two) times daily. 60 tablet 5  . atorvastatin (LIPITOR) 10 MG tablet Take 1 tablet by mouth every other day.     Marland Kitchen CALCIUM PO Take 750 mg by mouth 2 (two) times daily.    . Cholecalciferol (VITAMIN D PO) Take 1,000 mg by mouth daily.    . furosemide (LASIX) 20 MG tablet Take 1 tablet (20 mg total) by mouth daily. 30 tablet 6  . insulin lispro (HUMALOG) 100 UNIT/ML SOCT Inject 25 Units into Courtney skin.    . metoprolol tartrate (LOPRESSOR) 50 MG tablet Take 1.5 tablets (75 mg total) by mouth 2 (two) times daily. 90 tablet 11  . NON FORMULARY [facility]  Onychomycosis Nail Lacquer -  Fluconazole 2%, Terbinafine 1% DMSO Apply to affected nail once daily Qty. 120 gm 3 refills    . potassium chloride (K-DUR) 10 MEQ tablet  Take 1 tablet (10 mEq total) by mouth daily. 30 tablet 6  . SYNTHROID 75 MCG tablet Take 1 tablet by mouth daily.     No current facility-administered medications for this encounter.     No Known Allergies  Social History   Socioeconomic History  . Marital status: Married    Spouse name: Not on file  . Number of children: 2  . Years of education: Not on file  . Highest education level: Not on file  Occupational History  . Not on file  Social Needs  . Financial resource strain: Not on file  . Food insecurity:    Worry: Not on file    Inability: Not on file  .  Transportation needs:    Medical: Not on file    Non-medical: Not on file  Tobacco Use  . Smoking status: Never Smoker  . Smokeless tobacco: Never Used  Substance and Sexual Activity  . Alcohol use: No  . Drug use: Not on file  . Sexual activity: Not on file  Lifestyle  . Physical activity:    Days per week: Not on file    Minutes per session: Not on file  . Stress: Not on file  Relationships  . Social connections:    Talks on phone: Not on file    Gets together: Not on file    Attends religious service: Not on file    Active member of club or organization: Not on file    Attends meetings of clubs or organizations: Not on file    Relationship status: Not on file  . Intimate partner violence:    Fear of current or ex partner: Not on file    Emotionally abused: Not on file    Physically abused: Not on file    Forced sexual activity: Not on file  Other Topics Concern  . Not on file  Social History Narrative  . Not on file    Family History  Problem Relation Age of Onset  . Heart disease Unknown        No family history  . Hypertension Mother   . Osteoarthritis Mother   . Osteoporosis Mother   . Anemia Father   . Asthma Sister   . Osteoarthritis Sister   . OCD Sister   . Leukemia Brother   . Lupus Child     ROS- All systems are reviewed and negative except as per Courtney HPI above  Physical Exam: Vitals:   04/20/17 1106  BP: 112/66  Pulse: (!) 139  Weight: 190 lb 6.4 oz (86.4 kg)  Height: 5\' 6"  (1.676 m)   Wt Readings from Last 3 Encounters:  04/20/17 190 lb 6.4 oz (86.4 kg)  04/17/17 190 lb 12.8 oz (86.5 kg)  04/09/17 189 lb (85.7 kg)    Labs: Lab Results  Component Value Date   NA 138 04/20/2017   K 4.5 04/20/2017   CL 105 04/20/2017   CO2 27 04/20/2017   GLUCOSE 260 (H) 04/20/2017   BUN 25 (H) 04/20/2017   CREATININE 1.40 (H) 04/20/2017   CALCIUM 9.3 04/20/2017   No results found for: INR No results found for: CHOL, HDL, LDLCALC, TRIG   GEN-  Courtney Pearson is well appearing, alert and oriented x 3 today.   Head- normocephalic, atraumatic Eyes-  Sclera clear, conjunctiva pink Ears- hearing intact Oropharynx- clear Neck- supple, no JVP Lymph- no cervical lymphadenopathy Lungs- Clear to ausculation bilaterally, normal work of breathing Heart- irregular rate and rhythm,  no murmurs, rubs or gallops, PMI not laterally displaced GI- soft, NT, ND, + BS Extremities- no clubbing, cyanosis, or edema MS- no significant deformity or atrophy Skin- no rash or lesion Psych- euthymic mood, full affect Neuro- strength and sensation are intact  EKG- Atypical atrial flutter with controlled v rate, 78 bpm  qrs int 74 ms, qtc int 483 ms Epic records reviewed  Echo- Study Conclusions  - Left ventricle: Courtney cavity size was normal. Wall thickness was   normal. Systolic function was normal. Courtney estimated ejection   fraction was in Courtney range of 55% to 60%. Wall motion was normal;   there were no regional wall motion abnormalities. Courtney study is   not technically sufficient to allow evaluation of LV diastolic   function. - Aortic valve: Trileaflet; mildly thickened, mildly calcified   leaflets. - Mitral valve: Calcified annulus. Mildly thickened leaflets . - Pulmonary arteries: Systolic pressure was mildly to moderately   increased. PA peak pressure: 46 mm Hg (S). - Pericardium, extracardiac: A small pericardial effusion was   identified circumferential to Courtney heart, mostly along Courtney   posterior and right ventricular free wall. There was no evidence   of hemodynamic compromise.    Assessment and Plan: 1.Persistent aflutter Cardioversion scheduled for 4/8 with Dr. Oval Linsey Risk vrs benefit of cardioversion discussed Continue metoprolol at 75 mg bid, but reduce to 25 mg am of cardioversion as EKG in SR is in Courtney low 60's Triggers discussed She would prefer to address need for sleep study with her PCP Cmet,tsh,cbc today  2.Chadsvasc score  of 3 Continue eliquis 5 mg bid (started 3/12) States no missed doses Reminded not to miss any doses  F/u here one week after cardioversion Dr. Stanford Breed 5/24  Geroge Baseman. Aanyah Loa, [facility] [address] [place], [place] [address] [phone]

## 2017-04-21 LAB — T3, FREE: T3 FREE: 2.2 pg/mL (ref 2.0–4.4)

## 2017-04-30 ENCOUNTER — Encounter (HOSPITAL_COMMUNITY): Payer: Self-pay | Admitting: *Deleted

## 2017-04-30 ENCOUNTER — Ambulatory Visit (HOSPITAL_COMMUNITY): Payer: Medicare Other | Admitting: Certified Registered Nurse Anesthetist

## 2017-04-30 ENCOUNTER — Ambulatory Visit (HOSPITAL_COMMUNITY)
Admission: RE | Admit: 2017-04-30 | Discharge: 2017-04-30 | Disposition: A | Discharge status: Home / Self Care | Payer: Medicare Other | Source: Ambulatory Visit | Attending: Cardiovascular Disease | Admitting: Cardiovascular Disease

## 2017-04-30 ENCOUNTER — Encounter (HOSPITAL_COMMUNITY)
Admission: RE | Disposition: A | Discharge status: Home / Self Care | Payer: Self-pay | Source: Ambulatory Visit | Attending: Cardiovascular Disease

## 2017-04-30 DIAGNOSIS — E785 Hyperlipidemia, unspecified: Secondary | ICD-10-CM | POA: Diagnosis not present

## 2017-04-30 DIAGNOSIS — E109 Type 1 diabetes mellitus without complications: Secondary | ICD-10-CM | POA: Diagnosis not present

## 2017-04-30 DIAGNOSIS — I4819 Other persistent atrial fibrillation: Secondary | ICD-10-CM

## 2017-04-30 DIAGNOSIS — Z79899 Other long term (current) drug therapy: Secondary | ICD-10-CM | POA: Insufficient documentation

## 2017-04-30 DIAGNOSIS — Z79890 Hormone replacement therapy (postmenopausal): Secondary | ICD-10-CM | POA: Diagnosis not present

## 2017-04-30 DIAGNOSIS — I10 Essential (primary) hypertension: Secondary | ICD-10-CM | POA: Insufficient documentation

## 2017-04-30 DIAGNOSIS — I481 Persistent atrial fibrillation: Secondary | ICD-10-CM | POA: Diagnosis not present

## 2017-04-30 DIAGNOSIS — E039 Hypothyroidism, unspecified: Secondary | ICD-10-CM | POA: Diagnosis not present

## 2017-04-30 DIAGNOSIS — I4892 Unspecified atrial flutter: Secondary | ICD-10-CM | POA: Diagnosis not present

## 2017-04-30 DIAGNOSIS — Z794 Long term (current) use of insulin: Secondary | ICD-10-CM | POA: Insufficient documentation

## 2017-04-30 DIAGNOSIS — Z7901 Long term (current) use of anticoagulants: Secondary | ICD-10-CM | POA: Diagnosis not present

## 2017-04-30 DIAGNOSIS — I480 Paroxysmal atrial fibrillation: Secondary | ICD-10-CM | POA: Insufficient documentation

## 2017-04-30 HISTORY — PX: CARDIOVERSION: SHX1299

## 2017-04-30 SURGERY — CARDIOVERSION
Anesthesia: General

## 2017-04-30 MED ORDER — PROPOFOL 10 MG/ML IV BOLUS
INTRAVENOUS | Status: DC | PRN
Start: 2017-04-30 — End: 2017-04-30
  Administered 2017-04-30: 70 mg via INTRAVENOUS

## 2017-04-30 MED ORDER — METOPROLOL TARTRATE 50 MG PO TABS: 50.0000 mg | ORAL_TABLET | Freq: Two times a day (BID) | ORAL | 11 refills | Status: DC

## 2017-04-30 MED ORDER — SODIUM CHLORIDE 0.9 % IV SOLN
INTRAVENOUS | Status: DC | PRN
  Administered 2017-04-30: 10:00:00 via INTRAVENOUS

## 2017-04-30 MED ORDER — LIDOCAINE 2% (20 MG/ML) 5 ML SYRINGE
INTRAMUSCULAR | Status: DC | PRN
  Administered 2017-04-30: 80 mg via INTRAVENOUS

## 2017-04-30 MED ORDER — EPHEDRINE SULFATE-NACL 50-0.9 MG/10ML-% IV SOSY
PREFILLED_SYRINGE | INTRAVENOUS | Status: DC | PRN
  Administered 2017-04-30: 5 mg via INTRAVENOUS
  Administered 2017-04-30: 10 mg via INTRAVENOUS

## 2017-04-30 NOTE — Transfer of Care (Signed)
Immediate Anesthesia Transfer of Care Note  Patient: Courtney Pearson  Procedure(s) Performed: CARDIOVERSION (N/A )  Patient Location: Endoscopy Unit  Anesthesia Type:General  Level of Consciousness: awake and alert   Airway & Oxygen Therapy: Patient Spontanous Breathing  Post-op Assessment: Report given to RN, Post -op Vital signs reviewed and stable and Patient moving all extremities X 4  Post vital signs: Reviewed and stable  Last Vitals:  Vitals Value Taken Time  BP    Temp    Pulse    Resp    SpO2      Last Pain:  Vitals:   04/30/17 0930  TempSrc: Oral  PainSc: 0-No pain         Complications: No apparent anesthesia complications

## 2017-04-30 NOTE — Anesthesia Procedure Notes (Signed)
Procedure Name: General with mask airway Date/Time: 04/30/2017 10:58 AM Performed by: Harden Mo, CRNA Pre-anesthesia Checklist: Patient identified, Emergency Drugs available, Suction available and Patient being monitored Patient Re-evaluated:Patient Re-evaluated prior to induction Oxygen Delivery Method: Ambu bag Preoxygenation: Pre-oxygenation with 100% oxygen Induction Type: IV induction Placement Confirmation: positive ETCO2 and breath sounds checked- equal and bilateral Dental Injury: Teeth and Oropharynx as per pre-operative assessment

## 2017-04-30 NOTE — Discharge Instructions (Signed)
Please reduce metoprolol to 50 mg twice daily.   Electrical Cardioversion, Care After This sheet gives you information about how to care for yourself after your procedure. Your health care provider may also give you more specific instructions. If you have problems or questions, contact your health care provider. What can I expect after the procedure? After the procedure, it is common to have:  Some redness on the skin where the shocks were given.  Follow these instructions at home:  Do not drive for 24 hours if you were given a medicine to help you relax (sedative).  Take over-the-counter and prescription medicines only as told by your health care provider.  Ask your health care provider how to check your pulse. Check it often.  Rest for 48 hours after the procedure or as told by your health care provider.  Avoid or limit your caffeine use as told by your health care provider. Contact a health care provider if:  You feel like your heart is beating too quickly or your pulse is not regular.  You have a serious muscle cramp that does not go away. Get help right away if:  You have discomfort in your chest.  You are dizzy or you feel faint.  You have trouble breathing or you are short of breath.  Your speech is slurred.  You have trouble moving an arm or leg on one side of your body.  Your fingers or toes turn cold or blue. This information is not intended to replace advice given to you by your health care provider. Make sure you discuss any questions you have with your health care provider. Document Released: 10/30/2012 Document Revised: 08/13/2015 Document Reviewed: 07/16/2015 Elsevier Interactive Patient Education  [employer].

## 2017-04-30 NOTE — Interval H&P Note (Signed)
History and Physical Interval Note:  04/30/2017 10:42 AM  Courtney Pearson  has presented today for surgery, with the diagnosis of A-FIB  The various methods of treatment have been discussed with the patient and family. After consideration of risks, benefits and other options for treatment, the patient has consented to  Procedure(s): CARDIOVERSION (N/A) as a surgical intervention .  The patient's history has been reviewed, patient examined, no change in status, stable for surgery.  I have reviewed the patient's chart and labs.  Questions were answered to the patient's satisfaction.     [name], MD

## 2017-04-30 NOTE — CV Procedure (Signed)
Electrical Cardioversion Procedure Note Courtney Pearson [id number] Apr 21, 1950  Procedure: Electrical Cardioversion Indications:  Atrial Flutter  Procedure Details Consent: Risks of procedure as well as the alternatives and risks of each were explained to the (patient/caregiver).  Consent for procedure obtained. Time Out: Verified patient identification, verified procedure, site/side was marked, verified correct patient position, special equipment/implants available, medications/allergies/relevent history reviewed, required imaging and test results available.  Performed  Patient placed on cardiac monitor, pulse oximetry, supplemental oxygen as necessary.  Sedation given: propfol Pacer pads placed anterior and posterior chest.  Cardioverted 1 time(s).  Cardioverted at 150J.  Evaluation Findings: Post procedure EKG shows: sinus bradcardia.  There was a post conversion pause of 3.5 seconds. Complications: None Patient did tolerate procedure well.   [name], MD 04/30/2017, 11:19 AM

## 2017-04-30 NOTE — Anesthesia Preprocedure Evaluation (Addendum)
Anesthesia Evaluation  Patient identified by MRN, date of birth, ID band Patient awake    Reviewed: Allergy & Precautions, NPO status , Patient's Chart, lab work & pertinent test results  Airway Mallampati: II  TM Distance: >3 FB Neck ROM: Full    Dental no notable dental hx. (+) Dental Advisory Given, Teeth Intact   Pulmonary neg pulmonary ROS, shortness of breath,    Pulmonary exam normal breath sounds clear to auscultation       Cardiovascular Exercise Tolerance: Poor hypertension, negative cardio ROS Normal cardiovascular exam+ dysrhythmias Atrial Fibrillation  Rhythm:Regular Rate:Normal     Neuro/Psych negative neurological ROS     GI/Hepatic negative GI ROS, Neg liver ROS,   Endo/Other  diabetes, Type 1, Insulin Dependent  Renal/GU      Musculoskeletal   Abdominal   Peds  Hematology   Anesthesia Other Findings   Reproductive/Obstetrics                           Anesthesia Physical Anesthesia Plan  ASA: III  Anesthesia Plan: General   Post-op Pain Management:    Induction:   PONV Risk Score and Plan:   Airway Management Planned: Mask  Additional Equipment:   Intra-op Plan:   Post-operative Plan:   Informed Consent: I have reviewed the patients History and Physical, chart, labs and discussed the procedure including the risks, benefits and alternatives for the proposed anesthesia with the patient or authorized representative who has indicated his/her understanding and acceptance.     Plan Discussed with:   Anesthesia Plan Comments:         Anesthesia Quick Evaluation

## 2017-04-30 NOTE — Anesthesia Postprocedure Evaluation (Signed)
Anesthesia Post Note  Patient: Courtney Pearson  Procedure(s) Performed: CARDIOVERSION (N/A )     Patient location during evaluation: Endoscopy Anesthesia Type: General Level of consciousness: awake and alert Pain management: pain level controlled Vital Signs Assessment: post-procedure vital signs reviewed and stable Respiratory status: spontaneous breathing, nonlabored ventilation, respiratory function stable and patient connected to nasal cannula oxygen Cardiovascular status: blood pressure returned to baseline and stable Postop Assessment: no apparent nausea or vomiting Anesthetic complications: no    Last Vitals:  Vitals:   04/30/17 1155 04/30/17 1200  BP: (!) 102/54 (!) 104/47  Pulse: (!) 51 (!) 49  Resp: 12 13  Temp:    SpO2: 100% 100%    Last Pain:  Vitals:   04/30/17 1155  TempSrc:   PainSc: 0-No pain                 Barnet Glasgow

## 2017-04-30 NOTE — Progress Notes (Signed)
Patient's CBG is 157 @ 0934.

## 2017-05-08 ENCOUNTER — Encounter (HOSPITAL_COMMUNITY): Payer: Self-pay | Admitting: Nurse Practitioner

## 2017-05-08 ENCOUNTER — Ambulatory Visit (HOSPITAL_COMMUNITY)
Admission: RE | Admit: 2017-05-08 | Discharge: 2017-05-08 | Disposition: A | Discharge status: Home / Self Care | Payer: Medicare Other | Source: Ambulatory Visit | Attending: Nurse Practitioner | Admitting: Nurse Practitioner

## 2017-05-08 VITALS — BP 158/70 | HR 58 | Ht 66.0 in | Wt 188.4 lb

## 2017-05-08 DIAGNOSIS — E785 Hyperlipidemia, unspecified: Secondary | ICD-10-CM | POA: Diagnosis not present

## 2017-05-08 DIAGNOSIS — I10 Essential (primary) hypertension: Secondary | ICD-10-CM | POA: Insufficient documentation

## 2017-05-08 DIAGNOSIS — Z806 Family history of leukemia: Secondary | ICD-10-CM | POA: Insufficient documentation

## 2017-05-08 DIAGNOSIS — E039 Hypothyroidism, unspecified: Secondary | ICD-10-CM | POA: Insufficient documentation

## 2017-05-08 DIAGNOSIS — Z8249 Family history of ischemic heart disease and other diseases of the circulatory system: Secondary | ICD-10-CM | POA: Insufficient documentation

## 2017-05-08 DIAGNOSIS — Z9889 Other specified postprocedural states: Secondary | ICD-10-CM | POA: Insufficient documentation

## 2017-05-08 DIAGNOSIS — Z7901 Long term (current) use of anticoagulants: Secondary | ICD-10-CM | POA: Insufficient documentation

## 2017-05-08 DIAGNOSIS — Z8261 Family history of arthritis: Secondary | ICD-10-CM | POA: Diagnosis not present

## 2017-05-08 DIAGNOSIS — E119 Type 2 diabetes mellitus without complications: Secondary | ICD-10-CM | POA: Diagnosis not present

## 2017-05-08 DIAGNOSIS — Z825 Family history of asthma and other chronic lower respiratory diseases: Secondary | ICD-10-CM | POA: Diagnosis not present

## 2017-05-08 DIAGNOSIS — Z794 Long term (current) use of insulin: Secondary | ICD-10-CM | POA: Insufficient documentation

## 2017-05-08 DIAGNOSIS — I4892 Unspecified atrial flutter: Secondary | ICD-10-CM | POA: Diagnosis not present

## 2017-05-08 DIAGNOSIS — Z79899 Other long term (current) drug therapy: Secondary | ICD-10-CM | POA: Insufficient documentation

## 2017-05-08 DIAGNOSIS — I480 Paroxysmal atrial fibrillation: Secondary | ICD-10-CM | POA: Diagnosis not present

## 2017-05-08 MED ORDER — METOPROLOL TARTRATE 50 MG PO TABS: ORAL_TABLET | ORAL | 11 refills | Status: DC

## 2017-05-08 NOTE — Patient Instructions (Signed)
Decrease metoprolol to 25mg twice a day 

## 2017-05-08 NOTE — Progress Notes (Signed)
Primary Care Physician: Darcus Austin, MD Referring Physician:Dr. Oval Linsey Cardiologist : Dr. Sharlette Dense is a 67 y.o. female with a h/o hypertension, hyperlipidemia, palpitations and diabetes who presents for management of new onset atrial flutter. Ms. Almendariz has a history of palpitations and last saw Dr. Stanford Breed in 2014.  Her palpitations are typically well controlled with propranolol which she takes approximately once or twice per month.  Today she saw her endocrinologist and was noted to be in atrial flutter with ventricular rates in the 120s-140s.  She notes that for the preceding 3 days she is had intermittent palpitations that lasted longer than usual.  When she checked her heart rate on her blood pressure machine it read in the 140s.  She took propranolol with improvement to the 80s.  Ms. Mallo had a prolonged episode of bronchitis 1 month ago.  Her cough persisted for several weeks.  During this time she took several over the counter cold/cough meds with phenylephrine.  She also uses an albuterol inhaler.  She was seen by Dr. Oval Linsey 3/12 and meds were changed.  Today in the afib clinic, pt is in atrial flutter rate controlled, v rates in the 70's. Propanolol has been changed to metoprolol 25 mg bid. She has been placed on eliquis 5 mg bid as of 3/12. ASA was stopped. She has not noted any significant fatigue, shortness of breath, weight gain. Echo is pending. NO significant alcohol, tobacco or caffeine used. She does believe she snores.  F/u in afib clinic, 3/29. Since I saw her last, she has had increase of metoprolol for RVR from metoprolol  25 mg bid to 75 mg bid. She is now rate controlled in the 70's. She will be eligible for cardioversion after being on eliquis x 3 weeks , after 4/3. Still has c/o of fatigue.  F/u in afib clinic 4/16, f/u successful cardioversion. She is in SR but wih fatigue. She thinks 2/2 BB.. She still has some lower extremity edema. This is a  chronic issue for which she takes lasix. Some elevated systolic readings in the 161'W.  Today, she denies symptoms of palpitations, chest pain, shortness of breath, orthopnea, PND, lower extremity edema, dizziness, presyncope, syncope, or neurologic sequela. + fatigue.The patient is tolerating medications without difficulties and is otherwise without complaint today.   Past Medical History:  Diagnosis Date  . Diabetes ([place])   . HTN (hypertension)   . Hyperlipidemia   . Hypothyroidism   . Palpitation   . Paroxysmal atrial fibrillation ([place]) 04/03/2017  . Renal insufficiency    Past Surgical History:  Procedure Laterality Date  . BREAST BIOPSY    . CARDIOVERSION N/A 04/30/2017   Procedure: CARDIOVERSION;  Surgeon: [name], MD;  Location: [facility];  Service: Cardiovascular;  Laterality: N/A;  . CESAREAN SECTION    . EYE SURGERY      Current Outpatient Medications  Medication Sig Dispense Refill  . acetaminophen (TYLENOL) 500 MG tablet Take 500 mg by mouth every 6 (six) hours as needed for moderate pain or headache.    Marland Kitchen apixaban (ELIQUIS) 5 MG TABS tablet Take 1 tablet (5 mg total) by mouth 2 (two) times daily. 60 tablet 5  . atorvastatin (LIPITOR) 10 MG tablet Take 10 mg by mouth every other day.     Marland Kitchen CALCIUM PO Take 1 tablet by mouth 2 (two) times daily.     . cholecalciferol (VITAMIN D) 1000 units tablet Take 1,000 mg by mouth daily.    Marland Kitchen  furosemide (LASIX) 20 MG tablet Take 1 tablet (20 mg total) by mouth every other day. 30 tablet 6  . insulin lispro (HUMALOG) 100 UNIT/ML SOCT Inject 25 Units into the skin See admin instructions. Via insulin pump max of 25 units daily    . loperamide (IMODIUM A-D) 2 MG tablet Take 4 mg by mouth as needed for diarrhea or loose stools.    . metoprolol tartrate (LOPRESSOR) 50 MG tablet Take 1/2 tablet twice a day 90 tablet 11  . Multiple Vitamins-Minerals (MULTIVITAMIN ADULT PO) Take 1 tablet by mouth daily.    . potassium chloride  (K-DUR) 10 MEQ tablet Take 1 tablet (10 mEq total) by mouth daily. 30 tablet 6  . SYNTHROID 75 MCG tablet Take 75 mcg by mouth daily before breakfast.      No current facility-administered medications for this encounter.     No Known Allergies  Social History   Socioeconomic History  . Marital status: Married    Spouse name: Not on file  . Number of children: 2  . Years of education: Not on file  . Highest education level: Not on file  Occupational History  . Not on file  Social Needs  . Financial resource strain: Not on file  . Food insecurity:    Worry: Not on file    Inability: Not on file  . Transportation needs:    Medical: Not on file    Non-medical: Not on file  Tobacco Use  . Smoking status: Never Smoker  . Smokeless tobacco: Never Used  Substance and Sexual Activity  . Alcohol use: No  . Drug use: Not on file  . Sexual activity: Not on file  Lifestyle  . Physical activity:    Days per week: Not on file    Minutes per session: Not on file  . Stress: Not on file  Relationships  . Social connections:    Talks on phone: Not on file    Gets together: Not on file    Attends religious service: Not on file    Active member of club or organization: Not on file    Attends meetings of clubs or organizations: Not on file    Relationship status: Not on file  . Intimate partner violence:    Fear of current or ex partner: Not on file    Emotionally abused: Not on file    Physically abused: Not on file    Forced sexual activity: Not on file  Other Topics Concern  . Not on file  Social History Narrative  . Not on file    Family History  Problem Relation Age of Onset  . Heart disease Unknown        No family history  . Hypertension Mother   . Osteoarthritis Mother   . Osteoporosis Mother   . Anemia Father   . Asthma Sister   . Osteoarthritis Sister   . OCD Sister   . Leukemia Brother   . Lupus Child     ROS- All systems are reviewed and negative except  as per the HPI above  Physical Exam: Vitals:   05/08/17 1145  BP: (!) 158/70  Pulse: (!) 58  Weight: 188 lb 6.4 oz (85.5 kg)  Height: 5\' 6"  (1.676 m)   Wt Readings from Last 3 Encounters:  05/08/17 188 lb 6.4 oz (85.5 kg)  04/20/17 190 lb 6.4 oz (86.4 kg)  04/17/17 190 lb 12.8 oz (86.5 kg)    Labs: Lab Results  Component Value Date   NA 138 04/20/2017   K 4.5 04/20/2017   CL 105 04/20/2017   CO2 27 04/20/2017   GLUCOSE 260 (H) 04/20/2017   BUN 25 (H) 04/20/2017   CREATININE 1.40 (H) 04/20/2017   CALCIUM 9.3 04/20/2017   No results found for: INR No results found for: CHOL, HDL, LDLCALC, TRIG   GEN- The patient is well appearing, alert and oriented x 3 today.   Head- normocephalic, atraumatic Eyes-  Sclera clear, conjunctiva pink Ears- hearing intact Oropharynx- clear Neck- supple, no JVP Lymph- no cervical lymphadenopathy Lungs- Clear to ausculation bilaterally, normal work of breathing Heart- irregular rate and rhythm, no murmurs, rubs or gallops, PMI not laterally displaced GI- soft, NT, ND, + BS Extremities- no clubbing, cyanosis, or  Statis dermatitis skin changes of anterior shins, mild edema MS- no significant deformity or atrophy Skin- no rash or lesion Psych- euthymic mood, full affect Neuro- strength and sensation are intact  EKG- sinus brady at 58 bpm, PR int 180 ms, qrs  int 78 ms, qtc 429 ms Epic records reviewed  Echo- Study Conclusions  - Left ventricle: The cavity size was normal. Wall thickness was   normal. Systolic function was normal. The estimated ejection   fraction was in the range of 55% to 60%. Wall motion was normal;   there were no regional wall motion abnormalities. The study is   not technically sufficient to allow evaluation of LV diastolic   function. - Aortic valve: Trileaflet; mildly thickened, mildly calcified   leaflets. - Mitral valve: Calcified annulus. Mildly thickened leaflets . - Pulmonary arteries: Systolic pressure  was mildly to moderately   increased. PA peak pressure: 46 mm Hg (S). - Pericardium, extracardiac: A small pericardial effusion was   identified circumferential to the heart, mostly along the   posterior and right ventricular free wall. There was no evidence   of hemodynamic compromise.    Assessment and Plan: 1.Persistent aflutter S/p successful cardioversion Continue metoprolol  but reduce dose to 25 mg bid  For feeling of fatigue She would prefer to address need for sleep study with her PCP   2.Chadsvasc score of 3 Continue eliquis 5 mg bid  3. HTN Pt will continue to monitor at home BP readings and discuss with Dr. Oval Linsey Avoid salt  Pt wishes to establish with Dr. [place](requested in 3-4 weeks) vrs f/u with Dr. Stanford Breed, will message scheduling at [place]. Leigh Kaeding, [facility] [address] [place], [place] [address] [phone]

## 2017-05-18 ENCOUNTER — Telehealth (HOSPITAL_COMMUNITY): Payer: Self-pay | Admitting: *Deleted

## 2017-05-18 NOTE — Telephone Encounter (Signed)
PT cld reporting out of rhythm after just being cardioverted 04/08 and having a follow up last week.  Per Butch Penny pt should schedule to discuss antiarrhythmic options. Pt sched 04/30

## 2017-05-22 ENCOUNTER — Ambulatory Visit (HOSPITAL_COMMUNITY)
Admission: RE | Admit: 2017-05-22 | Discharge: 2017-05-22 | Disposition: A | Discharge status: Home / Self Care | Payer: Medicare Other | Source: Ambulatory Visit | Attending: Nurse Practitioner | Admitting: Nurse Practitioner

## 2017-05-22 ENCOUNTER — Encounter (HOSPITAL_COMMUNITY): Payer: Self-pay | Admitting: Nurse Practitioner

## 2017-05-22 VITALS — BP 112/68 | HR 162 | Ht 66.0 in | Wt 182.0 lb

## 2017-05-22 DIAGNOSIS — Z7901 Long term (current) use of anticoagulants: Secondary | ICD-10-CM | POA: Diagnosis not present

## 2017-05-22 DIAGNOSIS — E119 Type 2 diabetes mellitus without complications: Secondary | ICD-10-CM | POA: Insufficient documentation

## 2017-05-22 DIAGNOSIS — Z794 Long term (current) use of insulin: Secondary | ICD-10-CM | POA: Diagnosis not present

## 2017-05-22 DIAGNOSIS — Z79899 Other long term (current) drug therapy: Secondary | ICD-10-CM | POA: Insufficient documentation

## 2017-05-22 DIAGNOSIS — I10 Essential (primary) hypertension: Secondary | ICD-10-CM | POA: Insufficient documentation

## 2017-05-22 DIAGNOSIS — I4892 Unspecified atrial flutter: Secondary | ICD-10-CM | POA: Insufficient documentation

## 2017-05-22 DIAGNOSIS — E785 Hyperlipidemia, unspecified: Secondary | ICD-10-CM | POA: Diagnosis not present

## 2017-05-22 DIAGNOSIS — I480 Paroxysmal atrial fibrillation: Secondary | ICD-10-CM | POA: Insufficient documentation

## 2017-05-22 DIAGNOSIS — E039 Hypothyroidism, unspecified: Secondary | ICD-10-CM | POA: Insufficient documentation

## 2017-05-22 LAB — CBC
HEMATOCRIT: 38 % (ref 36.0–46.0)
HEMOGLOBIN: 12.3 g/dL (ref 12.0–15.0)
MCH: 29.7 pg (ref 26.0–34.0)
MCHC: 32.4 g/dL (ref 30.0–36.0)
MCV: 91.8 fL (ref 78.0–100.0)
Platelets: 205 10*3/uL (ref 150–400)
RBC: 4.14 MIL/uL (ref 3.87–5.11)
RDW: 14.4 % (ref 11.5–15.5)
WBC: 5.8 10*3/uL (ref 4.0–10.5)

## 2017-05-22 LAB — COMPREHENSIVE METABOLIC PANEL
ALBUMIN: 3.8 g/dL (ref 3.5–5.0)
ALT: 32 U/L (ref 14–54)
ANION GAP: 5 (ref 5–15)
AST: 39 U/L (ref 15–41)
Alkaline Phosphatase: 184 U/L — ABNORMAL HIGH (ref 38–126)
BILIRUBIN TOTAL: 0.7 mg/dL (ref 0.3–1.2)
BUN: 31 mg/dL — ABNORMAL HIGH (ref 6–20)
CO2: 27 mmol/L (ref 22–32)
Calcium: 9.5 mg/dL (ref 8.9–10.3)
Chloride: 104 mmol/L (ref 101–111)
Creatinine, Ser: 1.38 mg/dL — ABNORMAL HIGH (ref 0.44–1.00)
GFR calc Af Amer: 44 mL/min — ABNORMAL LOW (ref >60)
GFR, EST NON AFRICAN AMERICAN: 38 mL/min — AB (ref >60)
GLUCOSE: 344 mg/dL — AB (ref 65–99)
POTASSIUM: 4.5 mmol/L (ref 3.5–5.1)
Sodium: 136 mmol/L (ref 135–145)
TOTAL PROTEIN: 6.3 g/dL — AB (ref 6.5–8.1)

## 2017-05-22 NOTE — Progress Notes (Addendum)
Primary Care Physician: Darcus Austin, MD Referring Physician:Dr. Oval Linsey Cardiologist : Dr. Sharlette Dense is a 67 y.o. female with a h/o hypertension, hyperlipidemia, palpitations and diabetes who presented for management of new onset atrial flutter in March of this year.. Ms. Hammill has a history of palpitations and last saw Dr. Stanford Breed in 2014.  Her palpitations are typically well controlled with propranolol which she takes approximately once or twice per month.  Today she saw her endocrinologist and was noted to be in atrial flutter with ventricular rates in the 120s-140s.  She notes that for the preceding 3 days she is had intermittent palpitations that lasted longer than usual.  When she checked her heart rate on her blood pressure machine it read in the 140s.  She took propranolol with improvement to the 80s.  Ms. Harms had a prolonged episode of bronchitis 1 month ago.  Her cough persisted for several weeks.  During this time she took several over the counter cold/cough meds with phenylephrine.  She also uses an albuterol inhaler. She was seen by Dr. Oval Linsey 3/12 and meds were changed.  When seen  in afib clinic, 3/18, atrial flutter rate controlled, v rates in the 70's. Propanolol has been changed to metoprolol 25 mg bid. She has been placed on eliquis 5 mg bid as of 3/12. ASA was stopped. She has not noted any significant fatigue, shortness of breath, weight gain. No significant alcohol, tobacco or caffeine used. She does believe she snores.  F/u in afib clinic, 3/29. Since I saw her last, she has had increase of metoprolol for RVR from metoprolol  25 mg bid to 75 mg bid. She is now rate controlled in the 70's. She will be eligible for cardioversion after being on eliquis x 3 weeks , after 4/3. Still has c/o of fatigue.  F/u in afib clinic 4/16, f/u successful cardioversion. She is in SR but wih fatigue. She thinks 2/2 BB. Dose was reduced to 25 mg bid.. She still has  some lower extremity edema. This is a chronic issue for which she takes lasix. Some elevated systolic readings in the 381'O.   F/u in afib clinic, 4/30. She has successful cardioversion but went back into afib after around 12 days after cardioversion. She is back in the clinic to discuss antiarrythmic's.Her EKG reads rates in the 160's with Acute Stemi, but this does not fit clinic picture, no chest pain, in no distress. Pulse ox reads in 110's, I believe  the EKG machine is counting some flutter waves.  Today, she denies symptoms of palpitations, chest pain, shortness of breath, orthopnea, PND, lower extremity edema, dizziness, presyncope, syncope, or neurologic sequela. + fatigue.The patient is tolerating medications without difficulties and is otherwise without complaint today.   Past Medical History:  Diagnosis Date  . Diabetes ([place])   . HTN (hypertension)   . Hyperlipidemia   . Hypothyroidism   . Palpitation   . Paroxysmal atrial fibrillation ([place]) 04/03/2017  . Renal insufficiency    Past Surgical History:  Procedure Laterality Date  . BREAST BIOPSY    . CARDIOVERSION N/A 04/30/2017   Procedure: CARDIOVERSION;  Surgeon: [name], MD;  Location: [facility];  Service: Cardiovascular;  Laterality: N/A;  . CESAREAN SECTION    . EYE SURGERY      Current Outpatient Medications  Medication Sig Dispense Refill  . acetaminophen (TYLENOL) 500 MG tablet Take 500 mg by mouth every 6 (six) hours as needed for moderate pain  or headache.    Marland Kitchen apixaban (ELIQUIS) 5 MG TABS tablet Take 1 tablet (5 mg total) by mouth 2 (two) times daily. 60 tablet 5  . atorvastatin (LIPITOR) 10 MG tablet Take 10 mg by mouth every other day.     Marland Kitchen CALCIUM PO Take 1 tablet by mouth 2 (two) times daily.     . cholecalciferol (VITAMIN D) 1000 units tablet Take 1,000 mg by mouth daily.    . furosemide (LASIX) 20 MG tablet Take 1 tablet (20 mg total) by mouth every other day. 30 tablet 6  . insulin lispro  (HUMALOG) 100 UNIT/ML SOCT Inject 25 Units into the skin See admin instructions. Via insulin pump max of 25 units daily    . loperamide (IMODIUM A-D) 2 MG tablet Take 4 mg by mouth as needed for diarrhea or loose stools.    . metoprolol tartrate (LOPRESSOR) 50 MG tablet Take 75 mg by mouth 2 (two) times daily.    . Multiple Vitamins-Minerals (MULTIVITAMIN ADULT PO) Take 1 tablet by mouth daily.    . potassium chloride (K-DUR) 10 MEQ tablet Take 1 tablet (10 mEq total) by mouth daily. 30 tablet 6  . SYNTHROID 75 MCG tablet Take 75 mcg by mouth daily before breakfast.      No current facility-administered medications for this encounter.     No Known Allergies  Social History   Socioeconomic History  . Marital status: Married    Spouse name: Not on file  . Number of children: 2  . Years of education: Not on file  . Highest education level: Not on file  Occupational History  . Not on file  Social Needs  . Financial resource strain: Not on file  . Food insecurity:    Worry: Not on file    Inability: Not on file  . Transportation needs:    Medical: Not on file    Non-medical: Not on file  Tobacco Use  . Smoking status: Never Smoker  . Smokeless tobacco: Never Used  Substance and Sexual Activity  . Alcohol use: No  . Drug use: Not on file  . Sexual activity: Not on file  Lifestyle  . Physical activity:    Days per week: Not on file    Minutes per session: Not on file  . Stress: Not on file  Relationships  . Social connections:    Talks on phone: Not on file    Gets together: Not on file    Attends religious service: Not on file    Active member of club or organization: Not on file    Attends meetings of clubs or organizations: Not on file    Relationship status: Not on file  . Intimate partner violence:    Fear of current or ex partner: Not on file    Emotionally abused: Not on file    Physically abused: Not on file    Forced sexual activity: Not on file  Other Topics  Concern  . Not on file  Social History Narrative  . Not on file    Family History  Problem Relation Age of Onset  . Heart disease Unknown        No family history  . Hypertension Mother   . Osteoarthritis Mother   . Osteoporosis Mother   . Anemia Father   . Asthma Sister   . Osteoarthritis Sister   . OCD Sister   . Leukemia Brother   . Lupus Child  ROS- All systems are reviewed and negative except as per the HPI above  Physical Exam: Vitals:   05/22/17 0930  BP: 112/68  Pulse: (!) 162  Weight: 182 lb (82.6 kg)  Height: 5\' 6"  (1.676 m)   Wt Readings from Last 3 Encounters:  05/22/17 182 lb (82.6 kg)  05/08/17 188 lb 6.4 oz (85.5 kg)  04/20/17 190 lb 6.4 oz (86.4 kg)    Labs: Lab Results  Component Value Date   NA 136 05/22/2017   K 4.5 05/22/2017   CL 104 05/22/2017   CO2 27 05/22/2017   GLUCOSE 344 (H) 05/22/2017   BUN 31 (H) 05/22/2017   CREATININE 1.38 (H) 05/22/2017   CALCIUM 9.5 05/22/2017   No results found for: INR No results found for: CHOL, HDL, LDLCALC, TRIG   GEN- The patient is well appearing, alert and oriented x 3 today.   Head- normocephalic, atraumatic Eyes-  Sclera clear, conjunctiva pink Ears- hearing intact Oropharynx- clear Neck- supple, no JVP Lymph- no cervical lymphadenopathy Lungs- Clear to ausculation bilaterally, normal work of breathing Heart- irregular rate and rhythm, no murmurs, rubs or gallops, PMI not laterally displaced GI- soft, NT, ND, + BS Extremities- no clubbing, cyanosis, or  Statis dermatitis skin changes of anterior shins, mild edema MS- no significant deformity or atrophy Skin- no rash or lesion Psych- euthymic mood, full affect Neuro- strength and sensation are intact  EKG- Atrial flutter at 162 bpm by ekg, but I feel it is counting the flutter waves, HR by pulse ox and auscultation is in the 110's, EKG also read Acute MI but clinically pt is without chest pain and stable, Epic records reviewed  Echo-  Study Conclusions  - Left ventricle: The cavity size was normal. Wall thickness was   normal. Systolic function was normal. The estimated ejection   fraction was in the range of 55% to 60%. Wall motion was normal;   there were no regional wall motion abnormalities. The study is   not technically sufficient to allow evaluation of LV diastolic   function. - Aortic valve: Trileaflet; mildly thickened, mildly calcified   leaflets. - Mitral valve: Calcified annulus. Mildly thickened leaflets . - Pulmonary arteries: Systolic pressure was mildly to moderately   increased. PA peak pressure: 46 mm Hg (S). - Pericardium, extracardiac: A small pericardial effusion was   identified circumferential to the heart, mostly along the   posterior and right ventricular free wall. There was no evidence   of hemodynamic compromise.    Assessment and Plan: 1.Persistent aflutter S/p successful cardioversion but ERAF Back on metoprolol 75 mg bid  Discussed antiarrythmic's, I feel pt may be an candidate for flecainide, no known CAD, no structural heart disease but  discussed with Dr. 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 He suggested flecainide 50 mg bid and with repeat EKG in 2 days, will start Wednesday pm and have pt come in for EKG on Friday. Anticipate repeat cardioversion on that dose nest week.  Cbc and cmet today. Crcl cal at 50.85  2.Chadsvasc score of 3 Continue eliquis 5 mg bid  3. HTN BP stable   Josejuan Hoaglin C. Tajai Ihde, [facility] [address] [place], [place] [address] [phone]

## 2017-05-23 ENCOUNTER — Other Ambulatory Visit (HOSPITAL_COMMUNITY): Payer: Self-pay | Admitting: *Deleted

## 2017-05-23 MED ORDER — FLECAINIDE ACETATE 50 MG PO TABS: 50.0000 mg | ORAL_TABLET | Freq: Two times a day (BID) | ORAL | 2 refills | Status: DC

## 2017-05-23 MED ORDER — APIXABAN 5 MG PO TABS: 5.0000 mg | ORAL_TABLET | Freq: Two times a day (BID) | ORAL | 2 refills | Status: DC

## 2017-05-25 ENCOUNTER — Ambulatory Visit (HOSPITAL_COMMUNITY)
Admission: RE | Admit: 2017-05-25 | Discharge: 2017-05-25 | Disposition: A | Discharge status: Home / Self Care | Payer: Medicare Other | Source: Ambulatory Visit | Attending: Nurse Practitioner | Admitting: Nurse Practitioner

## 2017-05-25 DIAGNOSIS — I4891 Unspecified atrial fibrillation: Secondary | ICD-10-CM | POA: Insufficient documentation

## 2017-05-25 DIAGNOSIS — Z79899 Other long term (current) drug therapy: Secondary | ICD-10-CM | POA: Insufficient documentation

## 2017-05-25 DIAGNOSIS — I4892 Unspecified atrial flutter: Secondary | ICD-10-CM | POA: Insufficient documentation

## 2017-05-25 NOTE — Patient Instructions (Signed)
Cardioversion scheduled for Tuesday, May 7th  - Arrive at the [facility] and go to admitting at [facility] not eat or drink anything after midnight the night prior to your procedure.  - Take all your medication with a sip of water prior to arrival.  - You will not be able to drive home after your procedure.  On morning of cardioversion decrease metoprolol to 25mg  twice a day

## 2017-05-25 NOTE — H&P (View-Only) (Signed)
Pt in for repeat EKG since starting Flecainide.  To be reviewed by Roderic Palau, NP    Ekg on flecainide 50 mg bid, remains in atrial flutter, at 105 bpm. Will not increase flecainide due to prior discussion with Dr. Rayann Heman due to LAFB. Will set pt up for cardioversion. qrs int 78 ms, qtc 417 ms.

## 2017-05-25 NOTE — Progress Notes (Addendum)
Pt in for repeat EKG since starting Flecainide.  To be reviewed by Roderic Palau, NP    Ekg on flecainide 50 mg bid, remains in atrial flutter, at 105 bpm. Will not increase flecainide due to prior discussion with Dr. Rayann Heman due to LAFB. Will set pt up for cardioversion. qrs int 78 ms, qtc 417 ms.

## 2017-05-29 ENCOUNTER — Encounter (HOSPITAL_COMMUNITY)
Admission: RE | Disposition: A | Discharge status: Home / Self Care | Payer: Self-pay | Source: Ambulatory Visit | Attending: Cardiovascular Disease

## 2017-05-29 ENCOUNTER — Encounter (HOSPITAL_COMMUNITY): Payer: Self-pay | Admitting: Certified Registered"

## 2017-05-29 ENCOUNTER — Ambulatory Visit (HOSPITAL_COMMUNITY)
Admission: RE | Admit: 2017-05-29 | Discharge: 2017-05-29 | Disposition: A | Discharge status: Home / Self Care | Payer: Medicare Other | Source: Ambulatory Visit | Attending: Cardiovascular Disease | Admitting: Cardiovascular Disease

## 2017-05-29 DIAGNOSIS — Z538 Procedure and treatment not carried out for other reasons: Secondary | ICD-10-CM | POA: Insufficient documentation

## 2017-05-29 DIAGNOSIS — I4892 Unspecified atrial flutter: Secondary | ICD-10-CM | POA: Diagnosis not present

## 2017-05-29 DIAGNOSIS — I4891 Unspecified atrial fibrillation: Secondary | ICD-10-CM | POA: Insufficient documentation

## 2017-05-29 SURGERY — CANCELLED PROCEDURE

## 2017-05-29 NOTE — Interval H&P Note (Signed)
History and Physical Interval Note:  05/29/2017 10:28 AM  Courtney Pearson  has presented today for surgery, with the diagnosis of afib  The various methods of treatment have been discussed with the patient and family. After consideration of risks, benefits and other options for treatment, the patient has consented to  Procedure(s): CANCELLED PROCEDURE as a surgical intervention .  The patient's history has been reviewed, patient examined, no change in status, stable for surgery.  I have reviewed the patient's chart and labs.  Questions were answered to the patient's satisfaction.     Jenkins Rouge

## 2017-05-29 NOTE — Progress Notes (Signed)
Cardioversion cancelled, patient self converted. 12 leak EKG obtained. Dr. Johnsie Cancel spoke with patient about follow up plan prior to discharge.

## 2017-05-31 NOTE — Addendum Note (Signed)
Encounter addended by: Sherran Needs, NP on: 05/31/2017 4:54 PM  Actions taken: LOS modified

## 2017-06-03 ENCOUNTER — Encounter (HOSPITAL_COMMUNITY): Payer: Self-pay | Admitting: Cardiovascular Disease

## 2017-06-06 ENCOUNTER — Ambulatory Visit (HOSPITAL_COMMUNITY)
Admission: RE | Admit: 2017-06-06 | Discharge: 2017-06-06 | Disposition: A | Discharge status: Home / Self Care | Payer: Medicare Other | Source: Ambulatory Visit | Attending: Nurse Practitioner | Admitting: Nurse Practitioner

## 2017-06-06 VITALS — BP 118/64 | HR 83 | Ht 66.0 in | Wt 183.4 lb

## 2017-06-06 DIAGNOSIS — I10 Essential (primary) hypertension: Secondary | ICD-10-CM | POA: Insufficient documentation

## 2017-06-06 DIAGNOSIS — Z79899 Other long term (current) drug therapy: Secondary | ICD-10-CM | POA: Diagnosis not present

## 2017-06-06 DIAGNOSIS — Z79890 Hormone replacement therapy (postmenopausal): Secondary | ICD-10-CM | POA: Insufficient documentation

## 2017-06-06 DIAGNOSIS — E039 Hypothyroidism, unspecified: Secondary | ICD-10-CM | POA: Insufficient documentation

## 2017-06-06 DIAGNOSIS — E119 Type 2 diabetes mellitus without complications: Secondary | ICD-10-CM | POA: Insufficient documentation

## 2017-06-06 DIAGNOSIS — E785 Hyperlipidemia, unspecified: Secondary | ICD-10-CM | POA: Diagnosis not present

## 2017-06-06 DIAGNOSIS — I481 Persistent atrial fibrillation: Secondary | ICD-10-CM | POA: Diagnosis not present

## 2017-06-06 DIAGNOSIS — Z7901 Long term (current) use of anticoagulants: Secondary | ICD-10-CM | POA: Insufficient documentation

## 2017-06-06 DIAGNOSIS — I444 Left anterior fascicular block: Secondary | ICD-10-CM | POA: Diagnosis not present

## 2017-06-06 DIAGNOSIS — I480 Paroxysmal atrial fibrillation: Secondary | ICD-10-CM | POA: Insufficient documentation

## 2017-06-06 DIAGNOSIS — Z794 Long term (current) use of insulin: Secondary | ICD-10-CM | POA: Insufficient documentation

## 2017-06-06 DIAGNOSIS — I4892 Unspecified atrial flutter: Secondary | ICD-10-CM | POA: Diagnosis not present

## 2017-06-06 DIAGNOSIS — I4819 Other persistent atrial fibrillation: Secondary | ICD-10-CM

## 2017-06-06 NOTE — Progress Notes (Signed)
Primary Care Physician: Darcus Austin, MD Referring Physician:Dr. Oval Linsey Cardiologist : Dr. Sharlette Dense is a 67 y.o. female with a h/o hypertension, hyperlipidemia, palpitations and diabetes who presented for management of new onset atrial flutter in March of this year.. Courtney Pearson has a history of palpitations and last saw Dr. Stanford Breed in 2014.  Her palpitations are typically well controlled with propranolol which she takes approximately once or twice per month.  Today she saw her endocrinologist and was noted to be in atrial flutter with ventricular rates in Courtney 120s-140s.  She notes that for Courtney preceding 3 days she is had intermittent palpitations that lasted longer than usual.  When she checked her heart rate on her blood pressure machine it read in Courtney 140s.  She took propranolol with improvement to Courtney 80s.  Courtney Pearson had a prolonged episode of bronchitis 1 month ago.  Her cough persisted for several weeks.  During this time she took several over Courtney counter cold/cough meds with phenylephrine.  She also uses an albuterol inhaler. She was seen by Dr. Oval Linsey 3/12 and meds were changed.  When seen  in afib clinic, 3/18, atrial flutter rate controlled, v rates in Courtney 70's. Propanolol has been changed to metoprolol 25 mg bid. She has been placed on eliquis 5 mg bid as of 3/12. ASA was stopped. She has not noted any significant fatigue, shortness of breath, weight gain. No significant alcohol, tobacco or caffeine used. She does believe she snores.  F/u in afib clinic, 3/29. Since I saw her last, she has had increase of metoprolol for RVR from metoprolol  25 mg bid to 75 mg bid. She is now rate controlled in Courtney 70's. She will be eligible for cardioversion after being on eliquis x 3 weeks , after 4/3. Still has c/o of fatigue.  F/u in afib clinic 4/16, f/u successful cardioversion. She is in SR but wih fatigue. She thinks 2/2 BB. Dose was reduced to 25 mg bid.. She still has  some lower extremity edema. This is a chronic issue for which she takes lasix. Some elevated systolic readings in Courtney 161'W.   F/u in afib clinic, 4/30. She has successful cardioversion but went back into afib after around 12 days after cardioversion. She is back in Courtney clinic to discuss antiarrythmic's.Her EKG reads rates in Courtney 160's with Acute Stemi, but this does not fit clinic picture, no chest pain, in no distress. Pulse ox reads in 110's, I believe  Courtney EKG machine is erroneously  counting some flutter waves. It was decided to start 50 mg bid, but with discussion with Dr. Rayann Heman will not exceed 50 mg bid with LAFB.  F/u in afib clinic 5/15. PT presented to cardioversion and was in SR. Today he is in atrial flutter. Some days, she has a heart rate in Courtney 40's by her pulse ox monitor, and looks regular,  other days it is in Courtney 70-80's and more irregular. Otherwise, she is is feeling better than when afib first started.  Today, she denies symptoms of palpitations, chest pain, shortness of breath, orthopnea, PND, lower extremity edema, dizziness, presyncope, syncope, or neurologic sequela. + fatigue.Courtney Pearson is tolerating medications without difficulties and is otherwise without complaint today.   Past Medical History:  Diagnosis Date  . Diabetes ([place])   . HTN (hypertension)   . Hyperlipidemia   . Hypothyroidism   . Palpitation   . Paroxysmal atrial fibrillation ([place]) 04/03/2017  . Renal insufficiency  Past Surgical History:  Procedure Laterality Date  . BREAST BIOPSY    . CARDIOVERSION N/A 04/30/2017   Procedure: CARDIOVERSION;  Surgeon: [name], MD;  Location: [facility];  Service: Cardiovascular;  Laterality: N/A;  . CESAREAN SECTION    . EYE SURGERY      Current Outpatient Medications  Medication Sig Dispense Refill  . acetaminophen (TYLENOL) 500 MG tablet Take 500 mg by mouth every 6 (six) hours as needed for moderate pain or headache.    Marland Kitchen apixaban (ELIQUIS) 5 MG  TABS tablet Take 1 tablet (5 mg total) by mouth 2 (two) times daily. 180 tablet 2  . atorvastatin (LIPITOR) 10 MG tablet Take 10 mg by mouth every other day.     Marland Kitchen CALCIUM PO Take 1 tablet by mouth 2 (two) times daily.     . cholecalciferol (VITAMIN D) 1000 units tablet Take 1,000 mg by mouth daily.    . flecainide (TAMBOCOR) 50 MG tablet Take 1 tablet (50 mg total) by mouth 2 (two) times daily. 60 tablet 2  . furosemide (LASIX) 20 MG tablet Take 1 tablet (20 mg total) by mouth every other day. (Pearson taking differently: Take 20 mg by mouth as needed. ) 30 tablet 6  . insulin lispro (HUMALOG) 100 UNIT/ML SOCT Inject 25 Units into Courtney skin See admin instructions. Via insulin pump max of 25 units daily    . loperamide (IMODIUM A-D) 2 MG tablet Take 4 mg by mouth as needed for diarrhea or loose stools.    . metoprolol tartrate (LOPRESSOR) 50 MG tablet Take 50 mg by mouth 2 (two) times daily.     . Multiple Vitamins-Minerals (MULTIVITAMIN ADULT PO) Take 1 tablet by mouth daily.    . potassium chloride (K-DUR) 10 MEQ tablet Take 1 tablet (10 mEq total) by mouth daily. 30 tablet 6  . SYNTHROID 75 MCG tablet Take 75 mcg by mouth daily before breakfast.      No current facility-administered medications for this encounter.     No Known Allergies  Social History   Socioeconomic History  . Marital status: Married    Spouse name: Not on file  . Number of children: 2  . Years of education: Not on file  . Highest education level: Not on file  Occupational History  . Not on file  Social Needs  . Financial resource strain: Not on file  . Food insecurity:    Worry: Not on file    Inability: Not on file  . Transportation needs:    Medical: Not on file    Non-medical: Not on file  Tobacco Use  . Smoking status: Never Smoker  . Smokeless tobacco: Never Used  Substance and Sexual Activity  . Alcohol use: No  . Drug use: Not on file  . Sexual activity: Not on file  Lifestyle  . Physical  activity:    Days per week: Not on file    Minutes per session: Not on file  . Stress: Not on file  Relationships  . Social connections:    Talks on phone: Not on file    Gets together: Not on file    Attends religious service: Not on file    Active member of club or organization: Not on file    Attends meetings of clubs or organizations: Not on file    Relationship status: Not on file  . Intimate partner violence:    Fear of current or ex partner: Not on file  Emotionally abused: Not on file    Physically abused: Not on file    Forced sexual activity: Not on file  Other Topics Concern  . Not on file  Social History Narrative  . Not on file    Family History  Problem Relation Age of Onset  . Heart disease Unknown        No family history  . Hypertension Mother   . Osteoarthritis Mother   . Osteoporosis Mother   . Anemia Father   . Asthma Sister   . Osteoarthritis Sister   . OCD Sister   . Leukemia Brother   . Lupus Child     ROS- All systems are reviewed and negative except as per Courtney HPI above  Physical Exam: Vitals:   06/06/17 1103  BP: 118/64  Pulse: 83  Weight: 183 lb 6.4 oz (83.2 kg)  Height: 5\' 6"  (1.676 m)   Wt Readings from Last 3 Encounters:  06/06/17 183 lb 6.4 oz (83.2 kg)  05/22/17 182 lb (82.6 kg)  05/08/17 188 lb 6.4 oz (85.5 kg)    Labs: Lab Results  Component Value Date   NA 136 05/22/2017   K 4.5 05/22/2017   CL 104 05/22/2017   CO2 27 05/22/2017   GLUCOSE 344 (H) 05/22/2017   BUN 31 (H) 05/22/2017   CREATININE 1.38 (H) 05/22/2017   CALCIUM 9.5 05/22/2017   No results found for: INR No results found for: CHOL, HDL, LDLCALC, TRIG   GEN- Courtney Pearson is well appearing, alert and oriented x 3 today.   Head- normocephalic, atraumatic Eyes-  Sclera clear, conjunctiva pink Ears- hearing intact Oropharynx- clear Neck- supple, no JVP Lymph- no cervical lymphadenopathy Lungs- Clear to ausculation bilaterally, normal work of  breathing Heart- irregular rate and rhythm, no murmurs, rubs or gallops, PMI not laterally displaced GI- soft, NT, ND, + BS Extremities- no clubbing, cyanosis, or  Statis dermatitis skin changes of anterior shins, mild edema MS- no significant deformity or atrophy Skin- no rash or lesion Psych- euthymic mood, full affect Neuro- strength and sensation are intact  EKG- Atrial flutter with variable AV block LAFB, qrs int 82 ms, qtc  Epic records reviewed  Echo- Study Conclusions  - Left ventricle: Courtney cavity size was normal. Wall thickness was   normal. Systolic function was normal. Courtney estimated ejection   fraction was in Courtney range of 55% to 60%. Wall motion was normal;   there were no regional wall motion abnormalities. Courtney study is   not technically sufficient to allow evaluation of LV diastolic   function. - Aortic valve: Trileaflet; mildly thickened, mildly calcified   leaflets. - Mitral valve: Calcified annulus. Mildly thickened leaflets . - Pulmonary arteries: Systolic pressure was mildly to moderately   increased. PA peak pressure: 46 mm Hg (S). - Pericardium, extracardiac: A small pericardial effusion was   identified circumferential to Courtney heart, mostly along Courtney   posterior and right ventricular free wall. There was no evidence   of hemodynamic compromise.    Assessment and Plan: 1.Persistent aflutter S/p successful cardioversion but ERAF Back on metoprolol 75 mg bid  Started on flecainide 50 mg bid, hesitate to go above this  with LAFB Showed up for cardioversion in SR Now pt is unsure if she is going in and out Will arrange for monitor, if persistent need to set back for cardioversion, if paroxysmal, maybe discuss ablation or change in antiarrythmic  2.Chadsvasc score of 3 Continue eliquis 5 mg bid  3. HTN BP stable   Courtney Pearson, [facility] [address] [place], [place] [address] [phone]

## 2017-06-07 ENCOUNTER — Ambulatory Visit (INDEPENDENT_AMBULATORY_CARE_PROVIDER_SITE_OTHER): Payer: Medicare Other

## 2017-06-07 ENCOUNTER — Telehealth: Payer: Self-pay | Admitting: Cardiovascular Disease

## 2017-06-07 DIAGNOSIS — I4819 Other persistent atrial fibrillation: Secondary | ICD-10-CM

## 2017-06-07 DIAGNOSIS — I481 Persistent atrial fibrillation: Secondary | ICD-10-CM | POA: Diagnosis not present

## 2017-06-07 NOTE — Telephone Encounter (Signed)
Per Roderic Palau NP --- Will arrange for monitor, if persistent afib need to set back for cardioversion, if paroxysmal, maybe discuss ablation or change in antiarrythmic. Will see monitoring results as they come in.

## 2017-06-07 NOTE — Telephone Encounter (Signed)
Report from [facility] -  Initial strip  - shows afib  Rate of 90's   Patient also called to inform office that [place] her about monitor  RN notified patient patient verbalized understanding,and faxed  readings to  [place] NP ordered event monitor

## 2017-06-07 NOTE — Telephone Encounter (Signed)
Has abnormal EKG

## 2017-06-15 ENCOUNTER — Ambulatory Visit: Payer: Medicare Other | Admitting: Cardiology

## 2017-06-17 ENCOUNTER — Telehealth: Payer: Self-pay | Admitting: Cardiology

## 2017-06-17 NOTE — Telephone Encounter (Signed)
I was notified to call Courtney Pearson. She reportedly was in AF and had a 3.3 second conversion pause in transition from AF to NSR. The patient was called by [facility], to whom the patient reported that she was having diarrhea at the time of the episode. She endorsed some palpitations but refused further follow up.

## 2017-06-27 ENCOUNTER — Encounter (INDEPENDENT_AMBULATORY_CARE_PROVIDER_SITE_OTHER): Payer: Medicare Other | Admitting: Ophthalmology

## 2017-06-27 DIAGNOSIS — I10 Essential (primary) hypertension: Secondary | ICD-10-CM | POA: Diagnosis not present

## 2017-06-27 DIAGNOSIS — E10319 Type 1 diabetes mellitus with unspecified diabetic retinopathy without macular edema: Secondary | ICD-10-CM | POA: Diagnosis not present

## 2017-06-27 DIAGNOSIS — E103593 Type 1 diabetes mellitus with proliferative diabetic retinopathy without macular edema, bilateral: Secondary | ICD-10-CM | POA: Diagnosis not present

## 2017-06-27 DIAGNOSIS — H35033 Hypertensive retinopathy, bilateral: Secondary | ICD-10-CM | POA: Diagnosis not present

## 2017-06-27 DIAGNOSIS — H43813 Vitreous degeneration, bilateral: Secondary | ICD-10-CM | POA: Diagnosis not present

## 2017-07-02 ENCOUNTER — Encounter (HOSPITAL_COMMUNITY): Payer: Self-pay | Admitting: Nurse Practitioner

## 2017-07-02 ENCOUNTER — Ambulatory Visit (HOSPITAL_COMMUNITY)
Admission: RE | Admit: 2017-07-02 | Discharge: 2017-07-02 | Disposition: A | Discharge status: Home / Self Care | Payer: Medicare Other | Source: Ambulatory Visit | Attending: Nurse Practitioner | Admitting: Nurse Practitioner

## 2017-07-02 VITALS — BP 130/64 | HR 61 | Ht 66.0 in | Wt 179.0 lb

## 2017-07-02 DIAGNOSIS — Z79899 Other long term (current) drug therapy: Secondary | ICD-10-CM | POA: Diagnosis not present

## 2017-07-02 DIAGNOSIS — E785 Hyperlipidemia, unspecified: Secondary | ICD-10-CM | POA: Diagnosis not present

## 2017-07-02 DIAGNOSIS — I491 Atrial premature depolarization: Secondary | ICD-10-CM | POA: Insufficient documentation

## 2017-07-02 DIAGNOSIS — E119 Type 2 diabetes mellitus without complications: Secondary | ICD-10-CM | POA: Diagnosis not present

## 2017-07-02 DIAGNOSIS — I444 Left anterior fascicular block: Secondary | ICD-10-CM | POA: Diagnosis not present

## 2017-07-02 DIAGNOSIS — R0683 Snoring: Secondary | ICD-10-CM | POA: Insufficient documentation

## 2017-07-02 DIAGNOSIS — I480 Paroxysmal atrial fibrillation: Secondary | ICD-10-CM | POA: Diagnosis not present

## 2017-07-02 DIAGNOSIS — Z7901 Long term (current) use of anticoagulants: Secondary | ICD-10-CM | POA: Diagnosis not present

## 2017-07-02 DIAGNOSIS — I4892 Unspecified atrial flutter: Secondary | ICD-10-CM | POA: Diagnosis not present

## 2017-07-02 DIAGNOSIS — R5383 Other fatigue: Secondary | ICD-10-CM | POA: Insufficient documentation

## 2017-07-02 DIAGNOSIS — Z794 Long term (current) use of insulin: Secondary | ICD-10-CM | POA: Insufficient documentation

## 2017-07-02 DIAGNOSIS — I10 Essential (primary) hypertension: Secondary | ICD-10-CM | POA: Diagnosis not present

## 2017-07-02 DIAGNOSIS — E039 Hypothyroidism, unspecified: Secondary | ICD-10-CM | POA: Diagnosis not present

## 2017-07-02 NOTE — Progress Notes (Signed)
PCP: Darcus Austin, MD Primary Cardiologist: Dr Oval Linsey Primary EP: Dr Lucina Mellow is a 67 y.o. female who presents today for AF clinic follow-up.  Since last being seen in our clinic, the patient reports doing reasonably well.   She has occasional palpitations but is mostly unaware of her afib now that she has been adequately rate controlled.  She had a 3 second post termination pause on her recent monitor (personally reviewed) without symptoms. She snores, does not feel well rested upon waking and is tired during the day.  She reports having a sleep study several years ago, which was unrevealing.  Today, she denies symptoms of palpitations, chest pain, shortness of breath,  lower extremity edema, dizziness, presyncope, or syncope.  The patient is otherwise without complaint today.   Past Medical History:  Diagnosis Date  . Diabetes ([place])   . HTN (hypertension)   . Hyperlipidemia   . Hypothyroidism   . Palpitation   . Paroxysmal atrial fibrillation ([place]) 04/03/2017  . Renal insufficiency    Past Surgical History:  Procedure Laterality Date  . BREAST BIOPSY    . CARDIOVERSION N/A 04/30/2017   Procedure: CARDIOVERSION;  Surgeon: [name], MD;  Location: [facility];  Service: Cardiovascular;  Laterality: N/A;  . CESAREAN SECTION    . EYE SURGERY      ROS- all systems are reviewed and negatives except as per HPI above  Current Outpatient Medications  Medication Sig Dispense Refill  . acetaminophen (TYLENOL) 500 MG tablet Take 500 mg by mouth every 6 (six) hours as needed for moderate pain or headache.    Marland Kitchen apixaban (ELIQUIS) 5 MG TABS tablet Take 1 tablet (5 mg total) by mouth 2 (two) times daily. 180 tablet 2  . atorvastatin (LIPITOR) 10 MG tablet Take 10 mg by mouth every other day.     Marland Kitchen CALCIUM PO Take 1 tablet by mouth 2 (two) times daily.     . cholecalciferol (VITAMIN D) 1000 units tablet Take 1,000 mg by mouth daily.    . flecainide (TAMBOCOR) 50 MG  tablet Take 1 tablet (50 mg total) by mouth 2 (two) times daily. 60 tablet 2  . furosemide (LASIX) 20 MG tablet Take 1 tablet (20 mg total) by mouth every other day. (Patient taking differently: Take 20 mg by mouth as needed. ) 30 tablet 6  . insulin lispro (HUMALOG) 100 UNIT/ML SOCT Inject 25 Units into the skin See admin instructions. Via insulin pump max of 25 units daily    . loperamide (IMODIUM A-D) 2 MG tablet Take 4 mg by mouth as needed for diarrhea or loose stools.    . metoprolol tartrate (LOPRESSOR) 50 MG tablet Take 25 mg by mouth 2 (two) times daily.     . Multiple Vitamins-Minerals (MULTIVITAMIN ADULT PO) Take 1 tablet by mouth daily.    . potassium chloride (K-DUR) 10 MEQ tablet Take 1 tablet (10 mEq total) by mouth daily. 30 tablet 6  . SYNTHROID 75 MCG tablet Take 75 mcg by mouth daily before breakfast.      No current facility-administered medications for this encounter.     Physical Exam: Vitals:   07/02/17 1032  BP: 130/64  Pulse: 61  Weight: 179 lb (81.2 kg)  Height: 5\' 6"  (1.676 m)    GEN- The patient is well appearing, alert and oriented x 3 today.   Head- normocephalic, atraumatic Eyes-  Sclera clear, conjunctiva pink Ears- hearing intact Oropharynx- clear Lungs- Clear to  ausculation bilaterally, normal work of breathing Heart- Regular rate and rhythm, no murmurs, rubs or gallops, PMI not laterally displaced GI- soft, NT, ND, + BS Extremities- no clubbing, cyanosis, or edema  Wt Readings from Last 3 Encounters:  07/02/17 179 lb (81.2 kg)  06/06/17 183 lb 6.4 oz (83.2 kg)  05/22/17 182 lb (82.6 kg)    EKG tracing ordered today is personally reviewed and shows sinus rhythm with PACs, PR 192 msec, incomplete RBBB, LAD, Qtc 430 msec  Assessment and Plan:  1. The patient has symptomatic, recurrent paroxysmal atrial fibrillation as well as atrial flutter.  I have reviewed multiple ecgs of atrial flutter.  Though not typical in appearance, I cannot exclude  isthmus dependence. She is doing well with flecainide.  Given AV conduction system disease, I would not advise increased flecainide in the future. Chads2vasc score is 4.  she is anticoagulated with eliquis . Therapeutic strategies for afib including medicine (multaq, tikosyn, sotalol, amiodarone) and ablation were discussed in detail with the patient today. Risk, benefits, and alternatives to EP study and radiofrequency ablation for afib were also discussed in detail today. At this time, she is clear that she is not interested in additional AADs or ablation. She would prefer a conservative approach, which is reasonable.  2. Snoring, fatigue Per her request, I will refer to Dr Maxwell Caul for sleep evaluation  3. HTN Stable No change required today  Lifestyle modification encouraged  Return to AF clinic in 4 months Follow-up with Dr Oval Linsey as scheduled   Thompson Grayer MD, [facility] 07/02/2017 11:00 AM

## 2017-07-02 NOTE — Patient Instructions (Signed)
Call: [phone] - dr Isidoro Donning

## 2017-07-04 DIAGNOSIS — N183 Chronic kidney disease, stage 3 (moderate): Secondary | ICD-10-CM | POA: Diagnosis not present

## 2017-07-04 DIAGNOSIS — E1042 Type 1 diabetes mellitus with diabetic polyneuropathy: Secondary | ICD-10-CM | POA: Diagnosis not present

## 2017-07-04 DIAGNOSIS — Z794 Long term (current) use of insulin: Secondary | ICD-10-CM | POA: Diagnosis not present

## 2017-07-04 DIAGNOSIS — E1022 Type 1 diabetes mellitus with diabetic chronic kidney disease: Secondary | ICD-10-CM | POA: Diagnosis not present

## 2017-07-04 DIAGNOSIS — E11319 Type 2 diabetes mellitus with unspecified diabetic retinopathy without macular edema: Secondary | ICD-10-CM | POA: Diagnosis not present

## 2017-07-24 ENCOUNTER — Telehealth (HOSPITAL_COMMUNITY): Payer: Self-pay | Admitting: *Deleted

## 2017-07-24 NOTE — Telephone Encounter (Signed)
Received notification from [facility] that pt would like to postpone scheduling a consult with Dr. Isidoro Donning at this time.

## 2017-08-02 ENCOUNTER — Telehealth: Payer: Self-pay

## 2017-08-02 NOTE — Telephone Encounter (Signed)
lmov to schedule long term zio in [place] office per Roderic Palau

## 2017-08-03 NOTE — Telephone Encounter (Signed)
Scheduled 7/16

## 2017-08-15 ENCOUNTER — Encounter: Payer: Self-pay | Admitting: Cardiovascular Disease

## 2017-08-15 ENCOUNTER — Ambulatory Visit (INDEPENDENT_AMBULATORY_CARE_PROVIDER_SITE_OTHER): Payer: Medicare Other | Admitting: Cardiovascular Disease

## 2017-08-15 VITALS — BP 135/61 | HR 59 | Ht 67.0 in | Wt 179.6 lb

## 2017-08-15 DIAGNOSIS — I480 Paroxysmal atrial fibrillation: Secondary | ICD-10-CM

## 2017-08-15 DIAGNOSIS — I10 Essential (primary) hypertension: Secondary | ICD-10-CM | POA: Diagnosis not present

## 2017-08-15 DIAGNOSIS — R0681 Apnea, not elsewhere classified: Secondary | ICD-10-CM | POA: Diagnosis not present

## 2017-08-15 MED ORDER — FLECAINIDE ACETATE 50 MG PO TABS: 50.0000 mg | ORAL_TABLET | Freq: Two times a day (BID) | ORAL | 3 refills | Status: DC

## 2017-08-15 NOTE — Progress Notes (Signed)
Cardiology Office Note   Date:  08/15/2017   ID:  Courtney Pearson, DOB [birth year], MRN [id number]  PCP:  Darcus Austin, MD  Cardiologist:  Lilli Light Oval Linsey, MD, [facility]   Chief Complaint  Patient presents with  . Follow-up      History of Present Illness: Courtney Pearson is a 67 y.o. female with persistent atrial flutter, hypertension, hyperlipidemia, and diabetes here for follow up.  Courtney Pearson has a history of palpitations and last saw Dr. Stanford Breed in 2014.  Her palpitations were typically well controlled with propranolol which she takes approximately once or twice per month.  However she saw her endocrinologist 03/2017 and was noted to be in atrial flutter with ventricular rates in the 120s-140s.  She was added on my schedule for an acute visit.  Propranolol was switched to metoprolol and she was started on Eliquis.  She subsequently followed up in atrial fibrillation clinic and had an echo 04/12/2017 that revealed LVEF 55 to 60% with mildly elevated pulmonary pressures.  She was noted to have a small pericardial effusion but no evidence of tamponade.  She underwent cardioversion 04/2017 but had recurrent atrial for relation.  She wore an event monitor 05/2017 that showed she was in atrial fibrillation 50% of the time.  She also had a 3.3-second postconversion pause.  She was subsequently started on flecainide.  She reports that she has been doing well lately.  She has rare episodes of palpitations and does not seem to know when she is either in or out of atrial fibrillation.  She sometimes notes an extra heartbeat especially at night.  She has not experienced any chest pain or pressure and her breathing has been stable.  She denies any lower extremity edema, orthopnea, or PND.  Instead of taking Lasix every other day she is taking it as needed, approximately once or twice per week.  She has not been exercising much lately which she attributes to laziness.  She has no exertional symptoms.  She  also struggles with snacking late at night.  Courtney Pearson never had her sleep study.  Her husband notes that she snores and does have apneic episodes.  However, she thinks it has been somewhat better lately.  She does continue to have fatigue upon awakening and daytime somnolence.  She is hesitant to have another sleep study because she never received the results of the first when she had several years ago.   Past Medical History:  Diagnosis Date  . Diabetes ([place])   . HTN (hypertension)   . Hyperlipidemia   . Hypothyroidism   . Palpitation   . Paroxysmal atrial fibrillation ([place]) 04/03/2017  . Renal insufficiency     Past Surgical History:  Procedure Laterality Date  . BREAST BIOPSY    . CARDIOVERSION N/A 04/30/2017   Procedure: CARDIOVERSION;  Surgeon: [name], MD;  Location: [facility];  Service: Cardiovascular;  Laterality: N/A;  . CESAREAN SECTION    . EYE SURGERY       Current Outpatient Medications  Medication Sig Dispense Refill  . acetaminophen (TYLENOL) 500 MG tablet Take 500 mg by mouth every 6 (six) hours as needed for moderate pain or headache.    Marland Kitchen apixaban (ELIQUIS) 5 MG TABS tablet Take 1 tablet (5 mg total) by mouth 2 (two) times daily. 180 tablet 2  . atorvastatin (LIPITOR) 10 MG tablet Take 10 mg by mouth every other day.     Marland Kitchen CALCIUM PO Take 1 tablet by  mouth 2 (two) times daily.     . cholecalciferol (VITAMIN D) 1000 units tablet Take 1,000 mg by mouth daily.    . flecainide (TAMBOCOR) 50 MG tablet Take 1 tablet (50 mg total) by mouth 2 (two) times daily. 180 tablet 3  . furosemide (LASIX) 20 MG tablet Take 1 tablet (20 mg total) by mouth every other day. (Patient taking differently: Take 20 mg by mouth as needed. ) 30 tablet 6  . insulin lispro (HUMALOG) 100 UNIT/ML SOCT Inject 25 Units into the skin See admin instructions. Via insulin pump max of 25 units daily    . loperamide (IMODIUM A-D) 2 MG tablet Take 4 mg by mouth as needed for diarrhea or  loose stools.    . metoprolol tartrate (LOPRESSOR) 50 MG tablet Take 25 mg by mouth 2 (two) times daily.     . Multiple Vitamins-Minerals (MULTIVITAMIN ADULT PO) Take 1 tablet by mouth daily.    . potassium chloride (K-DUR) 10 MEQ tablet Take 1 tablet (10 mEq total) by mouth daily. 30 tablet 6  . SYNTHROID 75 MCG tablet Take 75 mcg by mouth daily before breakfast.      No current facility-administered medications for this visit.     Allergies:   Patient has no known allergies.    Social History:  The patient  reports that she has never smoked. She has never used smokeless tobacco. She reports that she does not drink alcohol.   Family History:  The patient's family history includes Anemia in her father; Asthma in her sister; Heart disease in her unknown relative; Hypertension in her mother; Leukemia in her brother; Lupus in her child; OCD in her sister; Osteoarthritis in her mother and sister; Osteoporosis in her mother.    ROS:  Please see the history of present illness.   Otherwise, review of systems are positive for none.   All other systems are reviewed and negative.    PHYSICAL EXAM: VS:  BP 135/61   Pulse (!) 59   Ht 5\' 7"  (1.702 m)   Wt 179 lb 9.6 oz (81.5 kg)   BMI 28.13 kg/m  , BMI Body mass index is 28.13 kg/m. GENERAL:  Well appearing HEENT: Pupils equal round and reactive, fundi not visualized, oral mucosa unremarkable NECK:  No jugular venous distention, waveform within normal limits, carotid upstroke brisk and symmetric, no bruits LUNGS:  Clear to auscultation bilaterally HEART:  RRR.  PMI not displaced or sustained,S1 and S2 within normal limits, no S3, no S4, no clicks, no rubs, no murmurs ABD:  Flat, positive bowel sounds normal in frequency in pitch, no bruits, no rebound, no guarding, no midline pulsatile mass, no hepatomegaly, no splenomegaly EXT:  2 plus pulses throughout, no edema, no cyanosis no clubbing SKIN:  No rashes no nodules NEURO:  Cranial nerves II  through XII grossly intact, motor grossly intact throughout PSYCH:  Cognitively intact, oriented to person place and time   EKG:  EKG is ordered today. The ekg ordered today demonstrates atrial fibrillation.  Ventricular rate 120 bpm.  Echo 04/12/17: Study Conclusions  - Left ventricle: The cavity size was normal. Wall thickness was   normal. Systolic function was normal. The estimated ejection   fraction was in the range of 55% to 60%. Wall motion was normal;   there were no regional wall motion abnormalities. The study is   not technically sufficient to allow evaluation of LV diastolic   function. - Aortic valve: Trileaflet; mildly thickened, mildly  calcified   leaflets. - Mitral valve: Calcified annulus. Mildly thickened leaflets . - Pulmonary arteries: Systolic pressure was mildly to moderately   increased. PA peak pressure: 46 mm Hg (S). - Pericardium, extracardiac: A small pericardial effusion was   identified circumferential to the heart, mostly along the   posterior and right ventricular free wall. There was no evidence   of hemodynamic compromise.  14 Day Event Monitor 06/07/17:  Quality: Fair.  Baseline artifact. Predominant rhythm: atrial fibrillation 58%.  Rates up to 110 bpm. Average heart rate: 77 bpm 3.3 second post conversion pause noted. PACs and PVCs, atrial bigeminy noted    Recent Labs: 04/20/2017: TSH 4.906 05/22/2017: ALT 32; BUN 31; Creatinine, Ser 1.38; Hemoglobin 12.3; Platelets 205; Potassium 4.5; Sodium 136   12/11/16: Sodium 140, potassium 4.4, BUN 26, creatinine 1.19 AST 22, ALT 18 Hemoglobin A1c 8.2% Total cholesterol 143, triglycerides 54, HDL 62, LDL 71   Lipid Panel No results found for: CHOL, TRIG, HDL, CHOLHDL, VLDL, LDLCALC, LDLDIRECT    Wt Readings from Last 3 Encounters:  08/15/17 179 lb 9.6 oz (81.5 kg)  07/02/17 179 lb (81.2 kg)  06/06/17 183 lb 6.4 oz (83.2 kg)      ASSESSMENT AND PLAN:  # Paroxysmal atrial  fibrillation: Currently in sinus rhythm.  She does not seem to be aware of when she is in her out of atrial for ablation anymore.  Continue flecainide, metoprolol, and Eliquis.  She is willing to have a sleep study at this time.  We will refer her again.  # Hypertension: Blood pressure is slightly above goal.  We discussed the importance of starting to exercise.  I recommended that she get 150 minutes of exercise per week.  Also work on limiting salt in her diet.  For now we will continue metoprolol.  If her blood pressure remains poorly controlled in the future we will need to add an antihypertensive.  # Hyperlipidemia: LDL at goal 11/2016.  Continue atorvastatin.   Current medicines are reviewed at length with the patient today.  The patient does not have concerns regarding medicines.  The following changes have been made: none  Labs/ tests ordered today include:   Orders Placed This Encounter  Procedures  . Split night study     Disposition:   FU with Matin Mattioli C. Oval Linsey, MD, [facility] in 6 months.     Signed, Tallyn Holroyd C. Oval Linsey, MD, [facility]  08/15/2017 1:14 PM    [facility]

## 2017-08-15 NOTE — Patient Instructions (Addendum)
Medication Instructions:  Your physician recommends that you continue on your current medications as directed. Please refer to the Current Medication list given to you today.  Labwork: None  Testing/Procedures: Your physician has recommended that you have a sleep study. This test records several body functions during sleep, including: brain activity, eye movement, oxygen and carbon dioxide blood levels, heart rate and rhythm, breathing rate and rhythm, the flow of air through your mouth and nose, snoring, body muscle movements, and chest and belly movement. WILL CALL YOU ONCE THIS HAS BEEN APPROVED BY YOUR INSURANCE TO SCHEDULE   Follow-Up: Your physician wants you to follow-up in: 6 months  You will receive a reminder letter in the mail two months in advance. If you don't receive a letter, please call our office to schedule the follow-up appointment.  Any Other Special Instructions Will Be Listed Below (If Applicable).  TRY TO EXERCISE 150 MINUTES EACH WEEK   If you need a refill on your cardiac medications before your next appointment, please call your pharmacy.  Sleep Studies A sleep study (polysomnogram) is a series of tests done while you are sleeping. It can show how well you sleep. This can help your health care provider diagnose a sleep disorder and show how severe your sleep disorder is. A sleep study may lead to treatment that will help you sleep better and prevent other medical problems caused by poor sleep. If you have a sleep disorder, you may also be at risk for:  Sleep-related accidents.  High blood pressure.  Heart disease.  Stroke.  Other medical conditions.  Sleep disorders are common. Your health care provider may suspect a sleep disorder if you:  Have loud snoring most nights.  Have brief periods when you stop breathing at night.  Feel sleepy on most days.  Fall asleep suddenly during the day.  Have trouble falling asleep or staying asleep.  Feel like  you need to move your legs when trying to fall asleep.  Have dreams that seem very real shortly after falling asleep.  Feel like you cannot move when you first wake up.  Which tests will I need to have? Most sleep studies last all night and include these tests:  Recordings of your brain activity.  Recordings of your eye movements.  Recording of your heart rate and rhythm.  Blood pressure readings.  Readings of the amount of oxygen in your blood.  Measurements of your chest and belly movement as you breathe during sleep.  If you have signs of the sleep disorder called sleep apnea during your test, you may get a mask to wear for the second half of the night.  The mask provides continuous positive airway pressure (CPAP). This may improve sleep apnea significantly.  You will then have all tests done again with the mask in place to see if your measurements and recordings change.  How are sleep studies done? Most sleep studies are done over one full night of sleep.  You will arrive at the study center in the evening and can go home in the morning.  Bring your pajamas and toothbrush.  Do not have caffeine on the day of your sleep study.  Your health care provider will let you know if you need to stop taking any of your regular medicines before the test.  To do the tests included in a polysomnogram, you will have:  Round, sticky patches with sensors attached to recording wires (electrodes) placed on your scalp, face, chest, and limbs.  Wires from all the electrodes and sensors run from your bed to a computer. The wires can be taken off and put back on if you need to get out of bed to go to the bathroom.  A sensor placed over your nose to measure airflow.  A finger clip put on one finger to measure your blood oxygen level.  A belt around your belly and a belt around your chest to measure breathing movements.  Where are sleep studies done? Sleep studies are done at sleep  centers. A sleep center may be inside a hospital, office, or clinic. The room where you have the study may look like a hospital room or a hotel room. The health care providers doing the study may come in and out of the room during the study. Most of the time, they will be in another room monitoring your test. How is information from sleep studies helpful? A polysomnogram can be used along with your medical history and a physical exam to diagnose conditions, such as:  Sleep apnea.  Restless legs syndrome.  Sleep-related seizure disorders.  Sleep-related movement disorders.  A medical doctor who specializes in sleep will evaluate your sleep study. The specialist will share the results with your primary health care provider. Treatments based on your sleep study may include:  Improving your sleep habits (sleep hygiene).  Wearing a CPAP mask.  Wearing an oral device at night to improve breathing and reduce snoring.  Taking medicine for: ? Restless legs syndrome. ? Sleep-related seizure disorder. ? Sleep-related movement disorder.  This information is not intended to replace advice given to you by your health care provider. Make sure you discuss any questions you have with your health care provider. Document Released: 07/16/2002 Document Revised: 09/05/2015 Document Reviewed: 03/17/2013 Elsevier Interactive Patient Education  [employer].

## 2017-08-20 ENCOUNTER — Telehealth: Payer: Self-pay | Admitting: *Deleted

## 2017-08-20 NOTE — Telephone Encounter (Signed)
-----   Message from Courtney Hansen, LPN sent at 8/86/4847 10:58 AM EDT ----- Hello This patient is needing sleep study Thanks Rip Harbour

## 2017-08-20 NOTE — Telephone Encounter (Signed)
Patient's husband informed of sleep study appointment. [place]'s contact information provided to him for patient if she needs to reschedule the date.

## 2017-09-16 ENCOUNTER — Ambulatory Visit (HOSPITAL_BASED_OUTPATIENT_CLINIC_OR_DEPARTMENT_OTHER): Payer: Medicare Other | Attending: Cardiovascular Disease | Admitting: Cardiovascular Disease

## 2017-09-16 VITALS — Ht 67.0 in | Wt 180.0 lb

## 2017-09-16 DIAGNOSIS — G4733 Obstructive sleep apnea (adult) (pediatric): Secondary | ICD-10-CM

## 2017-09-16 DIAGNOSIS — I480 Paroxysmal atrial fibrillation: Secondary | ICD-10-CM

## 2017-09-16 DIAGNOSIS — Z79890 Hormone replacement therapy (postmenopausal): Secondary | ICD-10-CM | POA: Insufficient documentation

## 2017-09-16 DIAGNOSIS — Z794 Long term (current) use of insulin: Secondary | ICD-10-CM | POA: Insufficient documentation

## 2017-09-16 DIAGNOSIS — Z7901 Long term (current) use of anticoagulants: Secondary | ICD-10-CM | POA: Diagnosis not present

## 2017-09-16 DIAGNOSIS — R0681 Apnea, not elsewhere classified: Secondary | ICD-10-CM

## 2017-09-16 DIAGNOSIS — Z9989 Dependence on other enabling machines and devices: Secondary | ICD-10-CM

## 2017-09-16 DIAGNOSIS — Z79899 Other long term (current) drug therapy: Secondary | ICD-10-CM | POA: Diagnosis not present

## 2017-09-25 ENCOUNTER — Encounter (HOSPITAL_BASED_OUTPATIENT_CLINIC_OR_DEPARTMENT_OTHER): Payer: Self-pay | Admitting: Cardiovascular Disease

## 2017-09-25 NOTE — Procedures (Signed)
Patient Name: Courtney Pearson, Courtney Pearson Date: 09/16/2017 Gender: Female D.O.B: [birth year] Age (years): 67 Referring Provider: [name] Height (inches): 22 Interpreting Physician: Shelva Majestic MD, ABSM Weight (lbs): 180 RPSGT: Jonna Coup BMI: 28 MRN: [id number] Neck Size: 14.00  CLINICAL INFORMATION Sleep Study Type: Split Night CPAP  Indication for sleep study: Excessive Daytime Sleepiness, Snoring  Epworth Sleepiness Score: 15  SLEEP STUDY TECHNIQUE As per the AASM Manual for the Scoring of Sleep and Associated Events v2.3 (April 2016) with a hypopnea requiring 4% desaturations.  The channels recorded and monitored were frontal, central and occipital EEG, electrooculogram (EOG), submentalis EMG (chin), nasal and oral airflow, thoracic and abdominal wall motion, anterior tibialis EMG, snore microphone, electrocardiogram, and pulse oximetry. Continuous positive airway pressure (CPAP) was initiated when the patient met split night criteria and was titrated according to treat sleep-disordered breathing.  MEDICATIONS     acetaminophen (TYLENOL) 500 MG tablet         apixaban (ELIQUIS) 5 MG TABS tablet         atorvastatin (LIPITOR) 10 MG tablet         CALCIUM PO         cholecalciferol (VITAMIN D) 1000 units tablet         flecainide (TAMBOCOR) 50 MG tablet         furosemide (LASIX) 20 MG tablet         insulin lispro (HUMALOG) 100 UNIT/ML SOCT         loperamide (IMODIUM A-D) 2 MG tablet         metoprolol tartrate (LOPRESSOR) 50 MG tablet         Multiple Vitamins-Minerals (MULTIVITAMIN ADULT PO)         potassium chloride (K-DUR) 10 MEQ tablet         SYNTHROID 75 MCG tablet      Medications self-administered by patient taken the night of the study : N/A  RESPIRATORY PARAMETERS Diagnostic Total AHI (/hr): 43.9 RDI (/hr): 43.9 OA Index (/hr): 18 CA Index (/hr): 0.0 REM AHI (/hr): 68.6 NREM AHI (/hr): 43.2 Supine AHI (/hr): 38.8 Non-supine AHI  (/hr): 54.7 Min O2 Sat (%): 79.0 Mean O2 (%): 92.8 Time below 88% (min): 13.4   Titration Optimal Pressure (cm): 9 AHI at Optimal Pressure (/hr): 0.0 Min O2 at Optimal Pressure (%): 94.0 Supine % at Optimal (%): 0 Sleep % at Optimal (%): 96   SLEEP ARCHITECTURE The recording time for the entire night was 365.9 minutes.  During a baseline period of 161.9 minutes, the patient slept for 123.0 minutes in REM and nonREM, yielding a sleep efficiency of 76.0%%. Sleep onset after lights out was 8.8 minutes with a REM latency of 130.5 minutes. The patient spent 11.4%% of the night in stage N1 sleep, 85.8%% in stage N2 sleep, 0.0%% in stage N3 and 2.8% in REM.  During the titration period of 192.5 minutes, the patient slept for 170.0 minutes in REM and nonREM, yielding a sleep efficiency of 88.3%%. Sleep onset after CPAP initiation was 8.9 minutes with a REM latency of 52.5 minutes. The patient spent 2.4%% of the night in stage N1 sleep, 82.4%% in stage N2 sleep, 0.0%% in stage N3 and 15.3% in REM.  CARDIAC DATA The 2 lead EKG demonstrated sinus rhythm. The mean heart rate was 100.0 beats per minute. Other EKG findings include: None.  LEG MOVEMENT DATA The total Periodic Limb Movements of Sleep (PLMS) were 0. The PLMS index was 0.0 .  IMPRESSIONS - Severe obstructive sleep apnea occurred during the diagnostic portion of the study (AHI 43.9/hour; ). An optimal PAP pressure was selected for this patient ( 9 cm of water) - No significant central sleep apnea occurred during the diagnostic portion of the study (CAI = 0.0/hour). - Significant oxygen desaturation  during the diagnostic portion of the study to a nadir of 79.0%. - The patient snored with soft snoring volume during the diagnostic portion of the study. - No cardiac abnormalities were noted during this study. - Clinically significant periodic limb movements did not occur during sleep.  DIAGNOSIS - Obstructive Sleep Apnea (327.23 [G47.33  ICD-10])  RECOMMENDATIONS - Recommend an initial trial of CPAP therapy on 9 cm H2O with heated humidification.  A Small size Philips Respironics Nasal Pillow Mask Nuance Pro Gel mask was used for the titration study. - Efforts should be made to optimize nasal and oral pharyngeal patency. - Avoid alcohol, sedatives and other CNS depressants that may worsen sleep apnea and disrupt normal sleep architecture. - Sleep hygiene should be reviewed to assess factors that may improve sleep quality. - Weight management and regular exercise should be initiated or continued. - Recommend a download be obtained in 30 days and sleep clinic evaluation after 4 weeks of therapy.  [Electronically signed] 09/25/2017 05:43 PM  Shelva Majestic MD, [facility], ABSM Diplomate, American Board of Sleep Medicine   NPI: [id number]  [place] PH: [phone]   FX: [phone] [place]

## 2017-09-27 ENCOUNTER — Telehealth: Payer: Self-pay | Admitting: *Deleted

## 2017-09-27 NOTE — Telephone Encounter (Signed)
-----   Message from Troy Sine, MD sent at 09/25/2017  5:58 PM EDT ----- Courtney Pearson  Please .  Notify patient and set up with DME company for CPAP set-up.

## 2017-09-27 NOTE — Progress Notes (Signed)
Patient notified of sleep study results and recommendations. 

## 2017-09-27 NOTE — Telephone Encounter (Signed)
Patient notified of sleep study results and recommendations. All questions were answered to patient's satisfaction. She requested that a copy be left at the front desk for her to pick up and review. Referral was made to [facility] for CPAP set up.

## 2017-10-01 DIAGNOSIS — Z23 Encounter for immunization: Secondary | ICD-10-CM | POA: Diagnosis not present

## 2017-10-01 DIAGNOSIS — E1022 Type 1 diabetes mellitus with diabetic chronic kidney disease: Secondary | ICD-10-CM | POA: Diagnosis not present

## 2017-10-01 DIAGNOSIS — E1042 Type 1 diabetes mellitus with diabetic polyneuropathy: Secondary | ICD-10-CM | POA: Diagnosis not present

## 2017-10-01 DIAGNOSIS — Z794 Long term (current) use of insulin: Secondary | ICD-10-CM | POA: Diagnosis not present

## 2017-10-01 DIAGNOSIS — E10319 Type 1 diabetes mellitus with unspecified diabetic retinopathy without macular edema: Secondary | ICD-10-CM | POA: Diagnosis not present

## 2017-10-01 DIAGNOSIS — N183 Chronic kidney disease, stage 3 (moderate): Secondary | ICD-10-CM | POA: Diagnosis not present

## 2017-10-08 DIAGNOSIS — Z794 Long term (current) use of insulin: Secondary | ICD-10-CM | POA: Diagnosis not present

## 2017-10-08 DIAGNOSIS — E1042 Type 1 diabetes mellitus with diabetic polyneuropathy: Secondary | ICD-10-CM | POA: Diagnosis not present

## 2017-10-08 DIAGNOSIS — I10 Essential (primary) hypertension: Secondary | ICD-10-CM | POA: Diagnosis not present

## 2017-10-08 DIAGNOSIS — E10319 Type 1 diabetes mellitus with unspecified diabetic retinopathy without macular edema: Secondary | ICD-10-CM | POA: Diagnosis not present

## 2017-10-08 DIAGNOSIS — N183 Chronic kidney disease, stage 3 (moderate): Secondary | ICD-10-CM | POA: Diagnosis not present

## 2017-10-08 DIAGNOSIS — E039 Hypothyroidism, unspecified: Secondary | ICD-10-CM | POA: Diagnosis not present

## 2017-10-08 DIAGNOSIS — I4892 Unspecified atrial flutter: Secondary | ICD-10-CM | POA: Diagnosis not present

## 2017-10-08 DIAGNOSIS — E1022 Type 1 diabetes mellitus with diabetic chronic kidney disease: Secondary | ICD-10-CM | POA: Diagnosis not present

## 2017-10-08 DIAGNOSIS — Z0000 Encounter for general adult medical examination without abnormal findings: Secondary | ICD-10-CM | POA: Diagnosis not present

## 2017-10-08 DIAGNOSIS — Z23 Encounter for immunization: Secondary | ICD-10-CM | POA: Diagnosis not present

## 2017-10-23 ENCOUNTER — Encounter (HOSPITAL_COMMUNITY): Payer: Self-pay | Admitting: Nurse Practitioner

## 2017-10-23 ENCOUNTER — Ambulatory Visit (HOSPITAL_COMMUNITY)
Admission: RE | Admit: 2017-10-23 | Discharge: 2017-10-23 | Disposition: A | Discharge status: Home / Self Care | Payer: Medicare Other | Source: Ambulatory Visit | Attending: Nurse Practitioner | Admitting: Nurse Practitioner

## 2017-10-23 VITALS — BP 110/62 | HR 91 | Ht 67.0 in | Wt 180.0 lb

## 2017-10-23 DIAGNOSIS — I10 Essential (primary) hypertension: Secondary | ICD-10-CM | POA: Diagnosis not present

## 2017-10-23 DIAGNOSIS — Z79899 Other long term (current) drug therapy: Secondary | ICD-10-CM | POA: Insufficient documentation

## 2017-10-23 DIAGNOSIS — I4892 Unspecified atrial flutter: Secondary | ICD-10-CM | POA: Insufficient documentation

## 2017-10-23 DIAGNOSIS — I480 Paroxysmal atrial fibrillation: Secondary | ICD-10-CM | POA: Diagnosis not present

## 2017-10-23 DIAGNOSIS — E119 Type 2 diabetes mellitus without complications: Secondary | ICD-10-CM | POA: Diagnosis not present

## 2017-10-23 DIAGNOSIS — G4733 Obstructive sleep apnea (adult) (pediatric): Secondary | ICD-10-CM | POA: Insufficient documentation

## 2017-10-23 DIAGNOSIS — E039 Hypothyroidism, unspecified: Secondary | ICD-10-CM | POA: Diagnosis not present

## 2017-10-23 DIAGNOSIS — E785 Hyperlipidemia, unspecified: Secondary | ICD-10-CM | POA: Diagnosis not present

## 2017-10-23 DIAGNOSIS — Z794 Long term (current) use of insulin: Secondary | ICD-10-CM | POA: Insufficient documentation

## 2017-10-23 DIAGNOSIS — Z9889 Other specified postprocedural states: Secondary | ICD-10-CM | POA: Insufficient documentation

## 2017-10-23 DIAGNOSIS — Z7901 Long term (current) use of anticoagulants: Secondary | ICD-10-CM | POA: Insufficient documentation

## 2017-10-23 DIAGNOSIS — Z8249 Family history of ischemic heart disease and other diseases of the circulatory system: Secondary | ICD-10-CM | POA: Diagnosis not present

## 2017-10-23 MED ORDER — METOPROLOL TARTRATE 25 MG PO TABS: 12.5000 mg | ORAL_TABLET | Freq: Two times a day (BID) | ORAL | 3 refills | Status: DC

## 2017-10-23 NOTE — Progress Notes (Signed)
Primary Care Physician: Darcus Austin, MD Referring Physician:Dr. Oval Linsey Cardiologist : Dr. Sharlette Dense is a 67 y.o. female with a h/o hypertension, hyperlipidemia, palpitations and diabetes who presented for management of new onset atrial flutter in March of this year.. Courtney Pearson has a history of palpitations and last saw Dr. Stanford Breed in 2014.  Her palpitations are typically well controlled with propranolol which Courtney Pearson takes approximately once or twice per month.  Today Courtney Pearson saw her endocrinologist and was noted to be in atrial flutter with ventricular rates in the 120s-140s.  Courtney Pearson notes that for the preceding 3 days Courtney Pearson is had intermittent palpitations that lasted longer than usual.  When Courtney Pearson checked her heart rate on her blood pressure machine it read in the 140s.  Courtney Pearson took propranolol with improvement to the 80s.  Courtney Pearson had a prolonged episode of bronchitis 1 month ago.  Her cough persisted for several weeks.  During this time Courtney Pearson took several over the counter cold/cough meds with phenylephrine.  Courtney Pearson also uses an albuterol inhaler. Courtney Pearson was seen by Dr. Oval Linsey 3/12 and meds were changed.  When seen  in afib clinic, 3/18, atrial flutter rate controlled, v rates in the 70's. Propanolol has been changed to metoprolol 25 mg bid. Courtney Pearson has been placed on eliquis 5 mg bid as of 3/12. ASA was stopped. Courtney Pearson has not noted any significant fatigue, shortness of breath, weight gain. No significant alcohol, tobacco or caffeine used. Courtney Pearson does believe Courtney Pearson snores.  F/u in afib clinic, 3/29. Since I saw her last, Courtney Pearson has had increase of metoprolol for RVR from metoprolol  25 mg bid to 75 mg bid. Courtney Pearson is now rate controlled in the 70's. Courtney Pearson will be eligible for cardioversion after being on eliquis x 3 weeks , after 4/3. Still has c/o of fatigue.  F/u in afib clinic 4/16, f/u successful cardioversion. Courtney Pearson is in SR but wih fatigue. Courtney Pearson thinks 2/2 BB. Dose was reduced to 25 mg bid.. Courtney Pearson still has  some lower extremity edema. This is a chronic issue for which Courtney Pearson takes lasix. Some elevated systolic readings in the 818'E.   F/u in afib clinic, 4/30. Courtney Pearson has successful cardioversion but went back into afib after around 12 days after cardioversion. Courtney Pearson is back in the clinic to discuss antiarrythmic's.Her EKG reads rates in the 160's with Acute Stemi, but this does not fit clinic picture, no chest pain, in no distress. Pulse ox reads in 110's, I believe  the EKG machine is erroneously  counting some flutter waves. It was decided to start 50 mg bid, but with discussion with Dr. Rayann Heman will not exceed 50 mg bid with LAFB.  F/u in afib clinic 5/15. Pt presented to cardioversion and was in SR. Today he is in atrial flutter. Some days, Courtney Pearson has a heart rate in the 40's by her pulse ox monitor, and looks regular,  other days it is in the 70-80's and more irregular. Otherwise, Courtney Pearson is is feeling better than when afib first started.  F/u in afib clinic 10/23/17 for c/o of lightheadedness.. Courtney Pearson continues on flecainide but Courtney Pearson admits that Courtney Pearson thinks Courtney Pearson has been out of rhythm more so lately. Courtney Pearson stopped her metoprolol 4 days ago as Courtney Pearson was having intermittent lightheadedness and thought it might be secondary to occasional low BP but Courtney Pearson does not feel that much improved off drug. Courtney Pearson also had a sleep study in August which showed severe sleep apnea with desat's to  79% and Courtney Pearson was to come back for cpap titration trial but the husband wanted her to wait and pursue an ablation fist.   Today, Courtney Pearson denies symptoms of palpitations, chest pain, shortness of breath, orthopnea, PND, lower extremity edema, dizziness, presyncope, syncope, or neurologic sequela. + for lightheadness.The patient is tolerating medications without difficulties and is otherwise without complaint today.   Past Medical History:  Diagnosis Date  . Diabetes ([place])   . HTN (hypertension)   . Hyperlipidemia   . Hypothyroidism   . Palpitation   .  Paroxysmal atrial fibrillation ([place]) 04/03/2017  . Renal insufficiency    Past Surgical History:  Procedure Laterality Date  . BREAST BIOPSY    . CARDIOVERSION N/A 04/30/2017   Procedure: CARDIOVERSION;  Surgeon: [name], MD;  Location: [place];  Service: Cardiovascular;  Laterality: N/A;  . CESAREAN SECTION    . EYE SURGERY      Current Outpatient Medications  Medication Sig Dispense Refill  . acetaminophen (TYLENOL) 500 MG tablet Take 500 mg by mouth every 6 (six) hours as needed for moderate pain or headache.    Marland Kitchen apixaban (ELIQUIS) 5 MG TABS tablet Take 1 tablet (5 mg total) by mouth 2 (two) times daily. 180 tablet 2  . atorvastatin (LIPITOR) 10 MG tablet Take 10 mg by mouth every other day.     Marland Kitchen CALCIUM PO Take 1 tablet by mouth 2 (two) times daily.     . cholecalciferol (VITAMIN D) 1000 units tablet Take 1,000 mg by mouth daily.    . flecainide (TAMBOCOR) 50 MG tablet Take 1 tablet (50 mg total) by mouth 2 (two) times daily. 180 tablet 3  . furosemide (LASIX) 20 MG tablet Take 1 tablet (20 mg total) by mouth every other day. (Patient taking differently: Take 20 mg by mouth as needed. ) 30 tablet 6  . insulin lispro (HUMALOG) 100 UNIT/ML SOCT Inject 25 Units into the skin See admin instructions. Via insulin pump max of 25 units daily    . loperamide (IMODIUM A-D) 2 MG tablet Take 4 mg by mouth as needed for diarrhea or loose stools.    . metoprolol tartrate (LOPRESSOR) 25 MG tablet Take 0.5 tablets (12.5 mg total) by mouth 2 (two) times daily. 30 tablet 3  . Multiple Vitamins-Minerals (MULTIVITAMIN ADULT PO) Take 1 tablet by mouth daily.    . potassium chloride (K-DUR) 10 MEQ tablet Take 1 tablet (10 mEq total) by mouth daily. 30 tablet 6  . SYNTHROID 75 MCG tablet Take 75 mcg by mouth daily before breakfast.      No current facility-administered medications for this encounter.     No Known Allergies  Social History   Socioeconomic History  . Marital status:  Married    Spouse name: Not on file  . Number of children: 2  . Years of education: Not on file  . Highest education level: Not on file  Occupational History  . Not on file  Social Needs  . Financial resource strain: Not on file  . Food insecurity:    Worry: Not on file    Inability: Not on file  . Transportation needs:    Medical: Not on file    Non-medical: Not on file  Tobacco Use  . Smoking status: Never Smoker  . Smokeless tobacco: Never Used  Substance and Sexual Activity  . Alcohol use: No  . Drug use: Not on file  . Sexual activity: Not on file  Lifestyle  .  Physical activity:    Days per week: Not on file    Minutes per session: Not on file  . Stress: Not on file  Relationships  . Social connections:    Talks on phone: Not on file    Gets together: Not on file    Attends religious service: Not on file    Active member of club or organization: Not on file    Attends meetings of clubs or organizations: Not on file    Relationship status: Not on file  . Intimate partner violence:    Fear of current or ex partner: Not on file    Emotionally abused: Not on file    Physically abused: Not on file    Forced sexual activity: Not on file  Other Topics Concern  . Not on file  Social History Narrative  . Not on file    Family History  Problem Relation Age of Onset  . Heart disease Unknown        No family history  . Hypertension Mother   . Osteoarthritis Mother   . Osteoporosis Mother   . Anemia Father   . Asthma Sister   . Osteoarthritis Sister   . OCD Sister   . Leukemia Brother   . Lupus Child     ROS- All systems are reviewed and negative except as per the HPI above  Physical Exam: Vitals:   10/23/17 1110  BP: 110/62  Pulse: 91  SpO2: 97%  Weight: 81.6 kg  Height: 5\' 7"  (1.702 m)   Wt Readings from Last 3 Encounters:  10/23/17 81.6 kg  09/16/17 81.6 kg  08/15/17 81.5 kg    Labs: Lab Results  Component Value Date   NA 136 05/22/2017    K 4.5 05/22/2017   CL 104 05/22/2017   CO2 27 05/22/2017   GLUCOSE 344 (H) 05/22/2017   BUN 31 (H) 05/22/2017   CREATININE 1.38 (H) 05/22/2017   CALCIUM 9.5 05/22/2017   No results found for: INR No results found for: CHOL, HDL, LDLCALC, TRIG   GEN- The patient is well appearing, alert and oriented x 3 today.   Head- normocephalic, atraumatic Eyes-  Sclera clear, conjunctiva pink Ears- hearing intact Oropharynx- clear Neck- supple, no JVP Lymph- no cervical lymphadenopathy Lungs- Clear to ausculation bilaterally, normal work of breathing Heart- irregular rate and rhythm, no murmurs, rubs or gallops, PMI not laterally displaced GI- soft, NT, ND, + BS Extremities- no clubbing, cyanosis, or  Statis dermatitis skin changes of anterior shins, mild edema MS- no significant deformity or atrophy Skin- no rash or lesion Psych- euthymic mood, full affect Neuro- strength and sensation are intact  EKG- Atrial flutter with variable AV block,  LAFB, qrs int 78 ms, qtc 447 ms Epic records reviewed  Echo- Study Conclusions  - Left ventricle: The cavity size was normal. Wall thickness was   normal. Systolic function was normal. The estimated ejection   fraction was in the range of 55% to 60%. Wall motion was normal;   there were no regional wall motion abnormalities. The study is   not technically sufficient to allow evaluation of LV diastolic   function. - Aortic valve: Trileaflet; mildly thickened, mildly calcified   leaflets. - Mitral valve: Calcified annulus. Mildly thickened leaflets . - Pulmonary arteries: Systolic pressure was mildly to moderately   increased. PA peak pressure: 46 mm Hg (S). - Pericardium, extracardiac: A small pericardial effusion was   identified circumferential to the heart, mostly along  the   posterior and right ventricular free wall. There was no evidence   of hemodynamic compromise.    Assessment and Plan: 1.Persistent paroxsymal afib/ aflutter(  possible isthmus dependent) Burden sounds to  have been higher recently   Recommended restart of metoprolol at 25 mg 1/2 tab bid Continue flecainide 50 mg bid, hesitate to go above this  with AV conduction, LAFB and with 3 second post termination pause on prior monitor Courtney Pearson is now interested in having an ablation, but I feel for her to have the best outcome, it is best to go ahead with her CPAP titration trial and will schedule to see Dr. Rayann Heman in about one month to discuss timing of ablation.  2.Chadsvasc score of 3 Continue eliquis 5 mg bid  3. HTN BP stable  4. Severe OSA Go ahead and schedule titration trial thru Dr. Evette Georges office  To see Dr. Rayann Heman in one month   Geroge Baseman. Mack Thurmon, [facility] [address] [place], [place] [address] [phone]

## 2017-10-23 NOTE — Patient Instructions (Signed)
Metoprolol 1/2 tablet twice a day (1/2 of a 25mg  tab twice a day)  Scheduling will be in touch for appt with Dr. Rayann Heman

## 2017-10-24 ENCOUNTER — Ambulatory Visit: Payer: Medicare Other | Admitting: Internal Medicine

## 2017-11-05 ENCOUNTER — Ambulatory Visit (HOSPITAL_COMMUNITY): Payer: Medicare Other | Admitting: Nurse Practitioner

## 2017-11-12 DIAGNOSIS — E1022 Type 1 diabetes mellitus with diabetic chronic kidney disease: Secondary | ICD-10-CM | POA: Diagnosis not present

## 2017-11-12 DIAGNOSIS — Z794 Long term (current) use of insulin: Secondary | ICD-10-CM | POA: Diagnosis not present

## 2017-11-12 DIAGNOSIS — Z1210 Encounter for screening for malignant neoplasm of intestinal tract, unspecified: Secondary | ICD-10-CM | POA: Diagnosis not present

## 2017-11-12 DIAGNOSIS — I4892 Unspecified atrial flutter: Secondary | ICD-10-CM | POA: Diagnosis not present

## 2017-11-26 ENCOUNTER — Telehealth: Payer: Self-pay

## 2017-11-26 NOTE — Telephone Encounter (Signed)
Patient with diagnosis of atrial fibrillation on Eliquis for anticoagulation.    Procedure: colonoscopy Date of procedure: 11/30/17  CHADS2-VASc score of  4 (, HTN, AGE, DM2, , female)  CrCl 50.2 Platelet count 205  Per office protocol, patient can hold Eliquis for 1 days prior to procedure.    Patient should restart Eliquis on the evening of procedure or day after, at discretion of procedure MD

## 2017-11-26 NOTE — Telephone Encounter (Signed)
   Primary Cardiologist: [name], MD  Chart reviewed as part of pre-operative protocol coverage. Patient was contacted 11/26/2017 in reference to pre-operative risk assessment for pending surgery as outlined below.  Courtney Pearson was last seen on 10/23/2017 by Roderic Palau in the afib clinic. She was possibly having some breakthrough atrial flutter and a low dose of BB was added to her flecainide therapy.  Since that day, SHAILYN WEYANDT has done well. She does feel an irregular heart beat but it is not fast.  She should be OK for colonoscopy but since She has an appointment with Dr. Rayann Heman on 11/6 and I have asked her to check with him about safety of sedation for colonoscopy. I will also send this note to Dr. Rayann Heman. (I will route this message to Dr Rayann Heman)  I have request to our pharmacist for input on holding anticoagualtion and then we will route this recommendation to the requesting party via [facility] fax function and remove from pre-op pool.  Please call with questions.  Daune Perch, NP 11/26/2017, 4:36 PM

## 2017-11-26 NOTE — Telephone Encounter (Signed)
Primary Cardiologist: [facility] Pre-operative Risk Assessment    Request for surgical clearance:  1. What type of surgery is being performed?  Colonoscopy  2. When is this surgery scheduled? 11/30/17  3. What type of clearance is required (medical clearance vs. Pharmacy clearance to hold med vs. Both)? Pharmacy  4. Are there any medications that need to be held prior to surgery and how long? Eliquis    5. Practice name and name of physician performing surgery? [facility] (Dr.Magod)  6. What is your office phone number [phone]    7.   What is your office fax number [phone]  8.   Anesthesia type (None, local, MAC, general) ?  Not listed   Courtney Pearson 11/26/2017, 12:51 PM  _________________________________________________________________   (provider comments below)

## 2017-11-28 ENCOUNTER — Ambulatory Visit (INDEPENDENT_AMBULATORY_CARE_PROVIDER_SITE_OTHER): Payer: Medicare Other | Admitting: Internal Medicine

## 2017-11-28 ENCOUNTER — Encounter: Payer: Self-pay | Admitting: Internal Medicine

## 2017-11-28 VITALS — BP 116/70 | HR 83 | Ht 67.0 in | Wt 186.0 lb

## 2017-11-28 DIAGNOSIS — I480 Paroxysmal atrial fibrillation: Secondary | ICD-10-CM | POA: Diagnosis not present

## 2017-11-28 DIAGNOSIS — G4733 Obstructive sleep apnea (adult) (pediatric): Secondary | ICD-10-CM

## 2017-11-28 DIAGNOSIS — Z01812 Encounter for preprocedural laboratory examination: Secondary | ICD-10-CM | POA: Diagnosis not present

## 2017-11-28 DIAGNOSIS — I4819 Other persistent atrial fibrillation: Secondary | ICD-10-CM | POA: Diagnosis not present

## 2017-11-28 DIAGNOSIS — I10 Essential (primary) hypertension: Secondary | ICD-10-CM

## 2017-11-28 NOTE — Progress Notes (Signed)
PCP: Darcus Austin, MD   Primary EP: Dr Lucina Mellow is a 67 y.o. female who presents today for routine electrophysiology followup.  Since last being seen in our clinic, the patient reports doing very well.  Today, she denies symptoms of palpitations, chest pain, shortness of breath,  lower extremity edema, dizziness, presyncope, or syncope.  The patient is otherwise without complaint today.   Past Medical History:  Diagnosis Date  . Diabetes ([place])   . HTN (hypertension)   . Hyperlipidemia   . Hypothyroidism   . Palpitation   . Paroxysmal atrial fibrillation ([place]) 04/03/2017  . Renal insufficiency    Past Surgical History:  Procedure Laterality Date  . BREAST BIOPSY    . CARDIOVERSION N/A 04/30/2017   Procedure: CARDIOVERSION;  Surgeon: [name], MD;  Location: [place];  Service: Cardiovascular;  Laterality: N/A;  . CESAREAN SECTION    . EYE SURGERY      ROS- all systems are reviewed and negatives except as per HPI above  Current Outpatient Medications  Medication Sig Dispense Refill  . acetaminophen (TYLENOL) 500 MG tablet Take 500 mg by mouth every 6 (six) hours as needed for moderate pain or headache.    Marland Kitchen apixaban (ELIQUIS) 5 MG TABS tablet Take 1 tablet (5 mg total) by mouth 2 (two) times daily. 180 tablet 2  . atorvastatin (LIPITOR) 10 MG tablet Take 10 mg by mouth every other day.     Marland Kitchen CALCIUM PO Take 1 tablet by mouth 2 (two) times daily.     . cholecalciferol (VITAMIN D) 1000 units tablet Take 1,000 mg by mouth daily.    . flecainide (TAMBOCOR) 50 MG tablet Take 1 tablet (50 mg total) by mouth 2 (two) times daily. 180 tablet 3  . furosemide (LASIX) 20 MG tablet Take 20 mg by mouth as needed for fluid.    Marland Kitchen insulin lispro (HUMALOG) 100 UNIT/ML SOCT Inject 25 Units into the skin See admin instructions. Via insulin pump max of 25 units daily    . loperamide (IMODIUM A-D) 2 MG tablet Take 4 mg by mouth as needed for diarrhea or loose stools.    .  metoprolol tartrate (LOPRESSOR) 25 MG tablet Take 0.5 tablets (12.5 mg total) by mouth 2 (two) times daily. 30 tablet 3  . Multiple Vitamins-Minerals (MULTIVITAMIN ADULT PO) Take 1 tablet by mouth daily.    . potassium chloride (K-DUR) 10 MEQ tablet Take 1 tablet (10 mEq total) by mouth daily. 30 tablet 6  . SYNTHROID 75 MCG tablet Take 75 mcg by mouth daily before breakfast.      No current facility-administered medications for this visit.     Physical Exam: Vitals:   11/28/17 1053  BP: 116/70  Pulse: 83  SpO2: 96%  Weight: 186 lb (84.4 kg)  Height: 5\' 7"  (1.702 m)    GEN- The patient is well appearing, alert and oriented x 3 today.   Head- normocephalic, atraumatic Eyes-  Sclera clear, conjunctiva pink Ears- hearing intact Oropharynx- clear Lungs- Clear to ausculation bilaterally, normal work of breathing Heart- Regular rate and rhythm, no murmurs, rubs or gallops, PMI not laterally displaced GI- soft, NT, ND, + BS Extremities- no clubbing, cyanosis, or edema  Wt Readings from Last 3 Encounters:  11/28/17 186 lb (84.4 kg)  10/23/17 180 lb (81.6 kg)  09/16/17 180 lb (81.6 kg)    EKG tracing ordered today is personally reviewed and shows coarse atrial fibrillation, V rate 83 bpm,  QTc 437 msec  Assessment and Plan:  1. Persistent afib/ atrial flutter afib has progressed to persistent.  She also has atrial flutter. She has occasional RVR but is V rates controlled today. chads2vasc score is 4.  She is on eliquis. Stop flecainide Therapeutic strategies for afib including medicine (tikosyn, amiodarone) and ablation were discussed in detail with the patient today. Risk, benefits, and alternatives to EP study and radiofrequency ablation for afib were also discussed in detail today. These risks include but are not limited to stroke, bleeding, vascular damage, tamponade, perforation, damage to the esophagus, lungs, and other structures, pulmonary vein stenosis, worsening renal  function, and death. The patient understands these risk and wishes to proceed.  We will therefore proceed with catheter ablation at the next available time.  Carto, ICE, anesthesia are requested for the procedure.  Will also obtain cardiac CT prior to the procedure to exclude LAA thrombus and further evaluate atrial anatomy.  2. HTN Stable No change required today  3. Severe sleep apnea Uses CPAP  Thompson Grayer MD, [facility] 11/28/2017 11:16 AM

## 2017-11-28 NOTE — Patient Instructions (Signed)
Medication Instructions:  Your physician has recommended you make the following change in your medication:   STOP: Flecainide  Labwork: Your physician recommends that you return for lab work after 12/02/17 for BMET and CBC   Testing/Procedures: Your physician has recommended that you have an ablation on 01/01/18. Catheter ablation is a medical procedure used to treat some cardiac arrhythmias (irregular heartbeats). During catheter ablation, a long, thin, flexible tube is put into a blood vessel in your groin (upper thigh), or neck. This tube is called an ablation catheter. It is then guided to your heart through the blood vessel. Radio frequency waves destroy small areas of heart tissue where abnormal heartbeats may cause an arrhythmia to start. Please see the instruction sheet given to you today.  Your physician has requested that you have cardiac CT within 7 days prior to your ablation. Cardiac computed tomography (CT) is a painless test that uses an x-ray machine to take clear, detailed pictures of your heart.    Follow-Up: Follow-up appointment in 4 weeks after your procedure at the [facility] with Roderic Palau, NP   Follow up appointment in 3 months after your procedure with Dr. Rayann Heman.     Instructions for your Ablation Procedure:  Please report to the [facility] 01/01/18 at 5:30 AM  Nothing to eat or drink after midnight the night prior to procedure  Do not take any medications the morning of the procedure  Plan 1 night stay   Someone will need to drive you home at discharge    Instructions for your Cardiac CT:  Please arrive at the [facility] main entrance of [facility] at ______ AM on________ (30-45 minutes prior to test start time)  [facility] [place], [place] [address] [phone]  Proceed to the [facility] Radiology Department (First Floor).  Please follow these instructions  carefully (unless otherwise directed):  On the Night Before the Test: . Drink plenty of water. . Do not consume any caffeinated/decaffeinated beverages or chocolate 12 hours prior to your test. . Do not take any antihistamines 12 hours prior to your test.  On the Day of the Test: . Drink plenty of water. Do not drink any water within one hour of the test. . Do not eat any food 4 hours prior to the test. . You may take your regular medications prior to the test. . Be sure to take your metoprolol 2 hours prior to the test . HOLD Furosemide and Potassium morning of the test.  After the Test: . Drink plenty of water. . After receiving IV contrast, you may experience a mild flushed feeling. This is normal. . On occasion, you may experience a mild rash up to 24 hours after the test. This is not dangerous. If this occurs, you can take Benadryl 25 mg and increase your fluid intake. . If you experience trouble breathing, this can be serious. If it is severe call 911 IMMEDIATELY. If it is mild, please call our office.    Cardiac Ablation Cardiac ablation is a procedure to stop some heart tissue from causing problems. The heart has many electrical connections. Sometimes these connections make the heart beat very fast or irregularly. Removing some problem areas can improve the heart rhythm or make it normal. What happens before the procedure?  Follow instructions from your doctor about what you cannot eat or drink.  Ask your doctor about: ? Changing or stopping your normal  medicines. This is important if you take diabetes medicines or blood thinners. ? Taking medicines such as aspirin and ibuprofen. These medicines can thin your blood. Do not take these medicines before your procedure if your doctor tells you not to.  Plan to have someone take you home.  If you will be going home right after the procedure, plan to have someone with you for 24 hours. What happens during the procedure?  To  lower your risk of infection: ? Your health care team will wash or sanitize their hands. ? Your skin will be washed with soap. ? Hair may be removed from your neck or groin.  An IV tube will be put into one of your veins.  You will be given a medicine to help you relax (sedative).  Skin on your neck or groin will be numbed.  A cut (incision) will be made in your neck or groin.  A needle will be put through your cut and into a vein in your neck or groin.  A tube (catheter) will be put into the needle. The tube will be moved to your heart. X-rays (fluoroscopy) will be used to help guide the tube.  Small devices (electrodes) on the tip of the tube will send out electrical currents.  Dye may be put through the tube. This helps your surgeon see your heart.  Electrical energy will be used to scar (ablate) some heart tissue. Your surgeon may use: ? Heat (radiofrequency energy). ? Laser energy. ? Extreme cold (cryoablation).  The tube will be taken out.  Pressure will be held on your cut. This helps stop bleeding.  A bandage (dressing) will be put on your cut. The procedure may vary. What happens after the procedure?  You will be monitored until your medicines have worn off.  Your cut will be watched for bleeding. You will need to lie still for a few hours.  Do not drive for 24 hours or as long as your doctor tells you. Summary  Cardiac ablation is a procedure to stop some heart tissue from causing problems.  Electrical energy will be used to scar (ablate) some heart tissue. This information is not intended to replace advice given to you by your health care provider. Make sure you discuss any questions you have with your health care provider. Document Released: 09/11/2012 Document Revised: 11/29/2015 Document Reviewed: 11/29/2015 Elsevier Interactive Patient Education  2017 [employer].

## 2017-11-30 DIAGNOSIS — Z1211 Encounter for screening for malignant neoplasm of colon: Secondary | ICD-10-CM | POA: Diagnosis not present

## 2017-12-06 DIAGNOSIS — H903 Sensorineural hearing loss, bilateral: Secondary | ICD-10-CM | POA: Diagnosis not present

## 2017-12-10 DIAGNOSIS — E039 Hypothyroidism, unspecified: Secondary | ICD-10-CM | POA: Diagnosis not present

## 2017-12-24 ENCOUNTER — Other Ambulatory Visit: Payer: Medicare Other | Admitting: *Deleted

## 2017-12-24 DIAGNOSIS — G4733 Obstructive sleep apnea (adult) (pediatric): Secondary | ICD-10-CM

## 2017-12-24 DIAGNOSIS — I10 Essential (primary) hypertension: Secondary | ICD-10-CM

## 2017-12-24 DIAGNOSIS — I4819 Other persistent atrial fibrillation: Secondary | ICD-10-CM | POA: Diagnosis not present

## 2017-12-24 DIAGNOSIS — I480 Paroxysmal atrial fibrillation: Secondary | ICD-10-CM | POA: Diagnosis not present

## 2017-12-24 DIAGNOSIS — Z01812 Encounter for preprocedural laboratory examination: Secondary | ICD-10-CM | POA: Diagnosis not present

## 2017-12-24 LAB — CBC
Hematocrit: 32.9 % — ABNORMAL LOW (ref 34.0–46.6)
Hemoglobin: 10.9 g/dL — ABNORMAL LOW (ref 11.1–15.9)
MCH: 31.1 pg (ref 26.6–33.0)
MCHC: 33.1 g/dL (ref 31.5–35.7)
MCV: 94 fL (ref 79–97)
PLATELETS: 212 10*3/uL (ref 150–450)
RBC: 3.51 x10E6/uL — ABNORMAL LOW (ref 3.77–5.28)
RDW: 12 % — ABNORMAL LOW (ref 12.3–15.4)
WBC: 4.9 10*3/uL (ref 3.4–10.8)

## 2017-12-24 LAB — BASIC METABOLIC PANEL
BUN/Creatinine Ratio: 18 (ref 12–28)
BUN: 24 mg/dL (ref 8–27)
CO2: 22 mmol/L (ref 20–29)
Calcium: 9.6 mg/dL (ref 8.7–10.3)
Chloride: 102 mmol/L (ref 96–106)
Creatinine, Ser: 1.31 mg/dL — ABNORMAL HIGH (ref 0.57–1.00)
GFR calc non Af Amer: 42 mL/min/{1.73_m2} — ABNORMAL LOW (ref >59)
GFR, EST AFRICAN AMERICAN: 48 mL/min/{1.73_m2} — AB (ref >59)
Glucose: 268 mg/dL — ABNORMAL HIGH (ref 65–99)
POTASSIUM: 4.3 mmol/L (ref 3.5–5.2)
SODIUM: 136 mmol/L (ref 134–144)

## 2017-12-25 ENCOUNTER — Ambulatory Visit (HOSPITAL_COMMUNITY): Admission: RE | Admit: 2017-12-25 | Payer: Medicare Other | Source: Ambulatory Visit

## 2017-12-25 ENCOUNTER — Ambulatory Visit (HOSPITAL_COMMUNITY)
Admission: RE | Admit: 2017-12-25 | Discharge: 2017-12-25 | Disposition: A | Discharge status: Home / Self Care | Payer: Medicare Other | Source: Ambulatory Visit | Attending: Internal Medicine | Admitting: Internal Medicine

## 2017-12-25 ENCOUNTER — Encounter (HOSPITAL_COMMUNITY): Payer: Self-pay

## 2017-12-25 DIAGNOSIS — I480 Paroxysmal atrial fibrillation: Secondary | ICD-10-CM

## 2017-12-25 DIAGNOSIS — G4733 Obstructive sleep apnea (adult) (pediatric): Secondary | ICD-10-CM

## 2017-12-25 DIAGNOSIS — I10 Essential (primary) hypertension: Secondary | ICD-10-CM

## 2017-12-25 DIAGNOSIS — I4819 Other persistent atrial fibrillation: Secondary | ICD-10-CM | POA: Diagnosis not present

## 2017-12-25 MED ORDER — IOPAMIDOL (ISOVUE-370) INJECTION 76%
80.0000 mL | Freq: Once | INTRAVENOUS | Status: AC | PRN
  Administered 2017-12-25: 80 mL via INTRAVENOUS

## 2017-12-28 ENCOUNTER — Telehealth: Payer: Self-pay | Admitting: Internal Medicine

## 2017-12-28 NOTE — Telephone Encounter (Signed)
New Message:    Patient calling concerning her handicap papers. please call patient.

## 2017-12-31 NOTE — Anesthesia Preprocedure Evaluation (Addendum)
Anesthesia Evaluation  Patient identified by MRN, date of birth, ID band Patient awake    Reviewed: Allergy & Precautions, H&P , NPO status , Patient's Chart, lab work & pertinent test results, reviewed documented beta blocker date and time   Airway Mallampati: II  TM Distance: >3 FB Neck ROM: Full    Dental no notable dental hx. (+) Teeth Intact, Dental Advisory Given   Pulmonary neg pulmonary ROS,    Pulmonary exam normal breath sounds clear to auscultation       Cardiovascular Exercise Tolerance: Good hypertension, Pt. on medications and Pt. on home beta blockers + dysrhythmias Atrial Fibrillation  Rhythm:Irregular Rate:Normal     Neuro/Psych negative neurological ROS  negative psych ROS   GI/Hepatic negative GI ROS, Neg liver ROS,   Endo/Other  diabetes, Type 1, Insulin DependentHypothyroidism   Renal/GU Renal InsufficiencyRenal disease  negative genitourinary   Musculoskeletal   Abdominal   Peds  Hematology negative hematology ROS (+)   Anesthesia Other Findings   Reproductive/Obstetrics negative OB ROS                            Anesthesia Physical Anesthesia Plan  ASA: III  Anesthesia Plan: General   Post-op Pain Management:    Induction: Intravenous  PONV Risk Score and Plan: 4 or greater and Ondansetron, Dexamethasone and Midazolam  Airway Management Planned: Oral ETT  Additional Equipment:   Intra-op Plan:   Post-operative Plan: Extubation in OR  Informed Consent: I have reviewed the patients History and Physical, chart, labs and discussed the procedure including the risks, benefits and alternatives for the proposed anesthesia with the patient or authorized representative who has indicated his/her understanding and acceptance.   Dental advisory given  Plan Discussed with: CRNA  Anesthesia Plan Comments:         Anesthesia Quick Evaluation

## 2018-01-01 ENCOUNTER — Ambulatory Visit (HOSPITAL_COMMUNITY)
Admission: RE | Admit: 2018-01-01 | Discharge: 2018-01-01 | Disposition: A | Discharge status: Home / Self Care | Payer: Medicare Other | Source: Ambulatory Visit | Attending: Internal Medicine | Admitting: Internal Medicine

## 2018-01-01 ENCOUNTER — Other Ambulatory Visit: Payer: Self-pay

## 2018-01-01 ENCOUNTER — Ambulatory Visit (HOSPITAL_COMMUNITY): Payer: Medicare Other | Admitting: Anesthesiology

## 2018-01-01 ENCOUNTER — Encounter (HOSPITAL_COMMUNITY): Payer: Self-pay | Admitting: *Deleted

## 2018-01-01 ENCOUNTER — Ambulatory Visit (HOSPITAL_COMMUNITY)
Admission: RE | Disposition: A | Discharge status: Home / Self Care | Payer: Self-pay | Source: Ambulatory Visit | Attending: Internal Medicine

## 2018-01-01 DIAGNOSIS — I4892 Unspecified atrial flutter: Secondary | ICD-10-CM | POA: Insufficient documentation

## 2018-01-01 DIAGNOSIS — Z9889 Other specified postprocedural states: Secondary | ICD-10-CM | POA: Insufficient documentation

## 2018-01-01 DIAGNOSIS — I10 Essential (primary) hypertension: Secondary | ICD-10-CM | POA: Diagnosis not present

## 2018-01-01 DIAGNOSIS — I4819 Other persistent atrial fibrillation: Secondary | ICD-10-CM | POA: Diagnosis not present

## 2018-01-01 DIAGNOSIS — Z79899 Other long term (current) drug therapy: Secondary | ICD-10-CM | POA: Insufficient documentation

## 2018-01-01 DIAGNOSIS — Z7901 Long term (current) use of anticoagulants: Secondary | ICD-10-CM | POA: Insufficient documentation

## 2018-01-01 DIAGNOSIS — E109 Type 1 diabetes mellitus without complications: Secondary | ICD-10-CM | POA: Diagnosis not present

## 2018-01-01 DIAGNOSIS — I4891 Unspecified atrial fibrillation: Secondary | ICD-10-CM | POA: Diagnosis not present

## 2018-01-01 DIAGNOSIS — E039 Hypothyroidism, unspecified: Secondary | ICD-10-CM | POA: Diagnosis not present

## 2018-01-01 DIAGNOSIS — I480 Paroxysmal atrial fibrillation: Secondary | ICD-10-CM | POA: Diagnosis not present

## 2018-01-01 DIAGNOSIS — Z79890 Hormone replacement therapy (postmenopausal): Secondary | ICD-10-CM | POA: Insufficient documentation

## 2018-01-01 DIAGNOSIS — E785 Hyperlipidemia, unspecified: Secondary | ICD-10-CM | POA: Diagnosis not present

## 2018-01-01 DIAGNOSIS — Z794 Long term (current) use of insulin: Secondary | ICD-10-CM | POA: Diagnosis not present

## 2018-01-01 DIAGNOSIS — E119 Type 2 diabetes mellitus without complications: Secondary | ICD-10-CM | POA: Insufficient documentation

## 2018-01-01 DIAGNOSIS — I483 Typical atrial flutter: Secondary | ICD-10-CM | POA: Diagnosis not present

## 2018-01-01 HISTORY — PX: ATRIAL FIBRILLATION ABLATION: EP1191

## 2018-01-01 LAB — GLUCOSE, CAPILLARY
GLUCOSE-CAPILLARY: 133 mg/dL — AB (ref 70–99)
Glucose-Capillary: 212 mg/dL — ABNORMAL HIGH (ref 70–99)

## 2018-01-01 LAB — POCT ACTIVATED CLOTTING TIME
Activated Clotting Time: 296 seconds
Activated Clotting Time: 301 seconds
Activated Clotting Time: 186 seconds
Activated Clotting Time: 197 seconds

## 2018-01-01 SURGERY — ATRIAL FIBRILLATION ABLATION
Anesthesia: General

## 2018-01-01 MED ORDER — SODIUM CHLORIDE 0.9 % IV SOLN
INTRAVENOUS | Status: DC | PRN
  Administered 2018-01-01: 20 ug/min via INTRAVENOUS

## 2018-01-01 MED ORDER — FENTANYL CITRATE (PF) 250 MCG/5ML IJ SOLN
INTRAMUSCULAR | Status: DC | PRN
  Administered 2018-01-01: 50 ug via INTRAVENOUS
  Administered 2018-01-01: 100 ug via INTRAVENOUS

## 2018-01-01 MED ORDER — SODIUM CHLORIDE 0.9 % IV SOLN
INTRAVENOUS | Status: DC
  Administered 2018-01-01 (×2): via INTRAVENOUS

## 2018-01-01 MED ORDER — HEPARIN (PORCINE) IN NACL 1000-0.9 UT/500ML-% IV SOLN
INTRAVENOUS | Status: DC | PRN
  Administered 2018-01-01: 500 mL

## 2018-01-01 MED ORDER — HYDROCODONE-ACETAMINOPHEN 5-325 MG PO TABS: 1.0000 | ORAL_TABLET | ORAL | Status: DC | PRN

## 2018-01-01 MED ORDER — HEPARIN SODIUM (PORCINE) 1000 UNIT/ML IJ SOLN
INTRAMUSCULAR | Status: DC | PRN
  Administered 2018-01-01: 3000 [IU] via INTRAVENOUS
  Administered 2018-01-01: 1000 [IU] via INTRAVENOUS

## 2018-01-01 MED ORDER — MIDAZOLAM HCL 5 MG/5ML IJ SOLN
INTRAMUSCULAR | Status: DC | PRN
  Administered 2018-01-01: 2 mg via INTRAVENOUS

## 2018-01-01 MED ORDER — ISOPROTERENOL HCL 0.2 MG/ML IJ SOLN
INTRAMUSCULAR | Status: AC
  Filled 2018-01-01: qty 5

## 2018-01-01 MED ORDER — ACETAMINOPHEN 325 MG PO TABS
650.0000 mg | ORAL_TABLET | ORAL | Status: DC | PRN
  Filled 2018-01-01: qty 2

## 2018-01-01 MED ORDER — PHENYLEPHRINE 40 MCG/ML (10ML) SYRINGE FOR IV PUSH (FOR BLOOD PRESSURE SUPPORT)
PREFILLED_SYRINGE | INTRAVENOUS | Status: DC | PRN
  Administered 2018-01-01 (×2): 80 ug via INTRAVENOUS
  Administered 2018-01-01: 40 ug via INTRAVENOUS
  Administered 2018-01-01: 80 ug via INTRAVENOUS

## 2018-01-01 MED ORDER — ONDANSETRON HCL 4 MG/2ML IJ SOLN
INTRAMUSCULAR | Status: DC | PRN
  Administered 2018-01-01: 4 mg via INTRAVENOUS

## 2018-01-01 MED ORDER — HEPARIN SODIUM (PORCINE) 1000 UNIT/ML IJ SOLN
INTRAMUSCULAR | Status: AC
  Filled 2018-01-01: qty 1

## 2018-01-01 MED ORDER — HEPARIN SODIUM (PORCINE) 1000 UNIT/ML IJ SOLN
INTRAMUSCULAR | Status: AC
  Filled 2018-01-01: qty 2

## 2018-01-01 MED ORDER — HEPARIN SODIUM (PORCINE) 1000 UNIT/ML IJ SOLN
INTRAMUSCULAR | Status: DC | PRN
  Administered 2018-01-01 (×2): 1000 [IU] via INTRAVENOUS
  Administered 2018-01-01: 12000 [IU] via INTRAVENOUS

## 2018-01-01 MED ORDER — PROPOFOL 10 MG/ML IV BOLUS
INTRAVENOUS | Status: DC | PRN
  Administered 2018-01-01: 100 mg via INTRAVENOUS

## 2018-01-01 MED ORDER — BUPIVACAINE HCL (PF) 0.25 % IJ SOLN
INTRAMUSCULAR | Status: AC
  Filled 2018-01-01: qty 30

## 2018-01-01 MED ORDER — PANTOPRAZOLE SODIUM 40 MG PO TBEC: 40.0000 mg | DELAYED_RELEASE_TABLET | Freq: Every day | ORAL | 0 refills | Status: DC

## 2018-01-01 MED ORDER — LIDOCAINE HCL (PF) 1 % IJ SOLN
INTRAMUSCULAR | Status: DC | PRN
  Administered 2018-01-01: 30 mL

## 2018-01-01 MED ORDER — SUGAMMADEX SODIUM 200 MG/2ML IV SOLN
INTRAVENOUS | Status: DC | PRN
  Administered 2018-01-01: 200 mg via INTRAVENOUS

## 2018-01-01 MED ORDER — ROCURONIUM BROMIDE 10 MG/ML (PF) SYRINGE
PREFILLED_SYRINGE | INTRAVENOUS | Status: DC | PRN
  Administered 2018-01-01: 50 mg via INTRAVENOUS
  Administered 2018-01-01: 20 mg via INTRAVENOUS

## 2018-01-01 MED ORDER — SODIUM CHLORIDE 0.9 % IV SOLN: 250.0000 mL | INTRAVENOUS | Status: DC | PRN

## 2018-01-01 MED ORDER — ISOPROTERENOL HCL 0.2 MG/ML IJ SOLN
INTRAVENOUS | Status: DC | PRN
  Administered 2018-01-01: 10 ug/min via INTRAVENOUS

## 2018-01-01 MED ORDER — APIXABAN 5 MG PO TABS
5.0000 mg | ORAL_TABLET | Freq: Once | ORAL | Status: AC
  Administered 2018-01-01: 5 mg via ORAL
  Filled 2018-01-01: qty 1

## 2018-01-01 MED ORDER — ONDANSETRON HCL 4 MG/2ML IJ SOLN: 4.0000 mg | Freq: Four times a day (QID) | INTRAMUSCULAR | Status: DC | PRN

## 2018-01-01 MED ORDER — LIDOCAINE 2% (20 MG/ML) 5 ML SYRINGE
INTRAMUSCULAR | Status: DC | PRN
  Administered 2018-01-01: 60 mg via INTRAVENOUS

## 2018-01-01 MED ORDER — PROTAMINE SULFATE 10 MG/ML IV SOLN
INTRAVENOUS | Status: DC | PRN
  Administered 2018-01-01 (×4): 10 mg via INTRAVENOUS

## 2018-01-01 MED ORDER — SODIUM CHLORIDE 0.9% FLUSH: 3.0000 mL | Freq: Two times a day (BID) | INTRAVENOUS | Status: DC

## 2018-01-01 MED ORDER — SODIUM CHLORIDE 0.9% FLUSH: 3.0000 mL | INTRAVENOUS | Status: DC | PRN

## 2018-01-01 SURGICAL SUPPLY — 18 items
BLANKET WARM UNDERBOD FULL ACC (MISCELLANEOUS) ×1 IMPLANT
CATH MAPPNG PENTARAY F 2-6-2MM (CATHETERS) IMPLANT
CATH NAVISTAR SMARTTOUCH DF (ABLATOR) ×1 IMPLANT
CATH SOUNDSTAR 3D IMAGING (CATHETERS) ×1 IMPLANT
CATH WEBSTER BI DIR CS D-F CRV (CATHETERS) ×1 IMPLANT
COVER SWIFTLINK CONNECTOR (BAG) ×1 IMPLANT
NDL BAYLIS TRANSSEPTAL 71CM (NEEDLE) IMPLANT
NEEDLE BAYLIS TRANSSEPTAL 71CM (NEEDLE) ×2 IMPLANT
PACK EP LATEX FREE (CUSTOM PROCEDURE TRAY) ×2
PACK EP LF (CUSTOM PROCEDURE TRAY) ×1 IMPLANT
PAD PRO RADIOLUCENT 2001M-C (PAD) ×2 IMPLANT
PATCH CARTO3 (PAD) ×1 IMPLANT
PENTARAY F 2-6-2MM (CATHETERS) ×2
SHEATH AVANTI 11F 11CM (SHEATH) ×1 IMPLANT
SHEATH PINNACLE 7F 10CM (SHEATH) ×2 IMPLANT
SHEATH PINNACLE 9F 10CM (SHEATH) ×1 IMPLANT
SHEATH SWARTZ TS SL2 63CM 8.5F (SHEATH) ×1 IMPLANT
TUBING SMART ABLATE COOLFLOW (TUBING) ×1 IMPLANT

## 2018-01-01 NOTE — Progress Notes (Signed)
Patient arrived in a-fib -now sinus brady-50s- with some periods of a-fib noted. At 1045 glucometer read 212 while scanner read 164. Act 97. Will redraw in 30 min.

## 2018-01-01 NOTE — Progress Notes (Addendum)
Site area:RFV x 3  Site Prior to Removal:  Level 0 Pressure Applied For:30 min Manual:   yes Patient Status During Pull:  stable Post Pull Site:  Level Post Pull Instructions Given:  yes Post Pull Pulses Present: palpable Dressing Applied:  clear Bedrest begins @ 1200 till 1800 Comments:

## 2018-01-01 NOTE — Progress Notes (Signed)
Up and walked and tolerated well; right groin stable no bleeding or hemtoma

## 2018-01-01 NOTE — Anesthesia Postprocedure Evaluation (Signed)
Anesthesia Post Note  Patient: Courtney Pearson  Procedure(s) Performed: ATRIAL FIBRILLATION ABLATION (N/A )     Patient location during evaluation: PACU Anesthesia Type: General Level of consciousness: awake and alert Pain management: pain level controlled Vital Signs Assessment: post-procedure vital signs reviewed and stable Respiratory status: spontaneous breathing, nonlabored ventilation, respiratory function stable and patient connected to nasal cannula oxygen Cardiovascular status: blood pressure returned to baseline and stable Postop Assessment: no apparent nausea or vomiting Anesthetic complications: no    Last Vitals:  Vitals:   01/01/18 1200 01/01/18 1240  BP: (!) 134/48 (!) 138/51  Pulse: (!) 49 (!) 54  Resp: 15 16  Temp:    SpO2: 90% 97%    Last Pain:  Vitals:   01/01/18 1240  TempSrc:   PainSc: 0-No pain                 Philippe Gang,W. EDMOND

## 2018-01-01 NOTE — Transfer of Care (Signed)
Immediate Anesthesia Transfer of Care Note  Patient: Courtney Pearson  Procedure(s) Performed: ATRIAL FIBRILLATION ABLATION (N/A )  Patient Location: Cath Lab  Anesthesia Type:General  Level of Consciousness: awake, alert  and oriented  Airway & Oxygen Therapy: Patient Spontanous Breathing  Post-op Assessment: Report given to RN, Post -op Vital signs reviewed and stable and Patient moving all extremities X 4  Post vital signs: Reviewed and stable  Last Vitals:  Vitals Value Taken Time  BP 163/72 01/01/2018 10:29 AM  Temp    Pulse 50 01/01/2018 10:33 AM  Resp 14 01/01/2018 10:33 AM  SpO2 93 % 01/01/2018 10:33 AM  Vitals shown include unvalidated device data.  Last Pain:  Vitals:   01/01/18 0556  PainSc: 0-No pain         Complications: No apparent anesthesia complications

## 2018-01-01 NOTE — Anesthesia Procedure Notes (Signed)
Procedure Name: Intubation Date/Time: 01/01/2018 7:41 AM Performed by: Harden Mo, CRNA Pre-anesthesia Checklist: Patient identified, Emergency Drugs available, Suction available and Patient being monitored Patient Re-evaluated:Patient Re-evaluated prior to induction Oxygen Delivery Method: Circle System Utilized Preoxygenation: Pre-oxygenation with 100% oxygen Induction Type: IV induction Ventilation: Mask ventilation without difficulty Laryngoscope Size: Miller and 2 Grade View: Grade I Tube type: Oral Tube size: 7.5 mm Number of attempts: 1 Airway Equipment and Method: Stylet and Oral airway Placement Confirmation: ETT inserted through vocal cords under direct vision,  positive ETCO2 and breath sounds checked- equal and bilateral Secured at: 22 cm Tube secured with: Tape Dental Injury: Teeth and Oropharynx as per pre-operative assessment

## 2018-01-01 NOTE — Discharge Instructions (Signed)
Femoral Site Care Refer to this sheet in the next few weeks. These instructions provide you with information about caring for yourself after your procedure. Your health care provider may also give you more specific instructions. Your treatment has been planned according to current medical practices, but problems sometimes occur. Call your health care provider if you have any problems or questions after your procedure. What can I expect after the procedure? After your procedure, it is typical to have the following:  Bruising at the site that usually fades within 1-2 weeks.  Blood collecting in the tissue (hematoma) that may be painful to the touch. It should usually decrease in size and tenderness within 1-2 weeks.  Follow these instructions at home:  Take medicines only as directed by your health care provider.  You may shower 24-48 hours after the procedure or as directed by your health care provider. Remove the bandage (dressing) and gently wash the site with plain soap and water. Pat the area dry with a clean towel. Do not rub the site, because this may cause bleeding.  Do not take baths, swim, or use a hot tub until your health care provider approves.  Check your insertion site every day for redness, swelling, or drainage.  Do not apply powder or lotion to the site.  Limit use of stairs to twice a day for the first 2-3 days or as directed by your health care provider.  Do not squat for the first 2-3 days or as directed by your health care provider.  Do not lift over 10 lb (4.5 kg) for 5 days after your procedure or as directed by your health care provider.  Ask your health care provider when it is okay to: ? Return to work or school. ? Resume usual physical activities or sports. ? Resume sexual activity.  Do not drive home if you are discharged the same day as the procedure. Have someone else drive you.  You may drive 24 hours after the procedure unless otherwise instructed by  your health care provider.  Do not operate machinery or power tools for 24 hours after the procedure or as directed by your health care provider.  If your procedure was done as an outpatient procedure, which means that you went home the same day as your procedure, a responsible adult should be with you for the first 24 hours after you arrive home.  Keep all follow-up visits as directed by your health care provider. This is important. Contact a health care provider if:  You have a fever.  You have chills.  You have increased bleeding from the site. Hold pressure on the site. Get help right away if:  You have unusual pain at the site.  You have redness, warmth, or swelling at the site.  You have drainage (other than a small amount of blood on the dressing) from the site.  The site is bleeding, and the bleeding does not stop after 30 minutes of holding steady pressure on the site.  Your leg or foot becomes pale, cool, tingly, or numb. This information is not intended to replace advice given to you by your health care provider. Make sure you discuss any questions you have with your health care provider. Document Released: 09/12/2013 Document Revised: 06/17/2015 Document Reviewed: 07/29/2013 Elsevier Interactive Patient Education  2018 [employer].   Moderate Conscious Sedation, Adult, Care After These instructions provide you with information about caring for yourself after your procedure. Your health care provider may also give  you more specific instructions. Your treatment has been planned according to current medical practices, but problems sometimes occur. Call your health care provider if you have any problems or questions after your procedure. What can I expect after the procedure? After your procedure, it is common:  To feel sleepy for several hours.  To feel clumsy and have poor balance for several hours.  To have poor judgment for several hours.  To vomit if you eat  too soon.  Follow these instructions at home: For at least 24 hours after the procedure:   Do not: ? Participate in activities where you could fall or become injured. ? Drive. ? Use heavy machinery. ? Drink alcohol. ? Take sleeping pills or medicines that cause drowsiness. ? Make important decisions or sign legal documents. ? Take care of children on your own.  Rest. Eating and drinking  Follow the diet recommended by your health care provider.  If you vomit: ? Drink water, juice, or soup when you can drink without vomiting. ? Make sure you have little or no nausea before eating solid foods. General instructions  Have a responsible adult stay with you until you are awake and alert.  Take over-the-counter and prescription medicines only as told by your health care provider.  If you smoke, do not smoke without supervision.  Keep all follow-up visits as told by your health care provider. This is important. Contact a health care provider if:  You keep feeling nauseous or you keep vomiting.  You feel light-headed.  You develop a rash.  You have a fever. Get help right away if:  You have trouble breathing. This information is not intended to replace advice given to you by your health care provider. Make sure you discuss any questions you have with your health care provider. Document Released: 10/30/2012 Document Revised: 06/14/2015 Document Reviewed: 05/01/2015 Elsevier Interactive Patient Education  2018 [place] procedure care instructions No driving for 4 days. No lifting over 5 lbs for 1 week. No vigorous or sexual activity for 1 week. You may return to work on 01/05/18. Keep procedure site clean & dry. If you notice increased pain, swelling, bleeding or pus, call/return!  You may shower, but no soaking baths/hot tubs/pools for 1 week.

## 2018-01-01 NOTE — H&P (Signed)
Primary EP: Dr Lucina Mellow is a 67 y.o. female who presents today for routine electrophysiology study and ablation of afib.  Since last being seen in our clinic, the patient reports doing very well.  Today, she denies symptoms of palpitations, chest pain, shortness of breath,  lower extremity edema, dizziness, presyncope, or syncope.  The patient is otherwise without complaint today.       Past Medical History:  Diagnosis Date  . Diabetes ([place])   . HTN (hypertension)   . Hyperlipidemia   . Hypothyroidism   . Palpitation   . Paroxysmal atrial fibrillation ([place]) 04/03/2017  . Renal insufficiency         Past Surgical History:  Procedure Laterality Date  . BREAST BIOPSY    . CARDIOVERSION N/A 04/30/2017   Procedure: CARDIOVERSION;  Surgeon: [name], MD;  Location: [facility];  Service: Cardiovascular;  Laterality: N/A;  . CESAREAN SECTION    . EYE SURGERY      ROS- all systems are reviewed and negatives except as per HPI above        Current Outpatient Medications  Medication Sig Dispense Refill  . acetaminophen (TYLENOL) 500 MG tablet Take 500 mg by mouth every 6 (six) hours as needed for moderate pain or headache.    Marland Kitchen apixaban (ELIQUIS) 5 MG TABS tablet Take 1 tablet (5 mg total) by mouth 2 (two) times daily. 180 tablet 2  . atorvastatin (LIPITOR) 10 MG tablet Take 10 mg by mouth every other day.     Marland Kitchen CALCIUM PO Take 1 tablet by mouth 2 (two) times daily.     . cholecalciferol (VITAMIN D) 1000 units tablet Take 1,000 mg by mouth daily.    . flecainide (TAMBOCOR) 50 MG tablet Take 1 tablet (50 mg total) by mouth 2 (two) times daily. 180 tablet 3  . furosemide (LASIX) 20 MG tablet Take 20 mg by mouth as needed for fluid.    Marland Kitchen insulin lispro (HUMALOG) 100 UNIT/ML SOCT Inject 25 Units into the skin See admin instructions. Via insulin pump max of 25 units daily    . loperamide (IMODIUM A-D) 2 MG tablet Take 4 mg by mouth as  needed for diarrhea or loose stools.    . metoprolol tartrate (LOPRESSOR) 25 MG tablet Take 0.5 tablets (12.5 mg total) by mouth 2 (two) times daily. 30 tablet 3  . Multiple Vitamins-Minerals (MULTIVITAMIN ADULT PO) Take 1 tablet by mouth daily.    . potassium chloride (K-DUR) 10 MEQ tablet Take 1 tablet (10 mEq total) by mouth daily. 30 tablet 6  . SYNTHROID 75 MCG tablet Take 75 mcg by mouth daily before breakfast.      No current facility-administered medications for this visit.     Physical Exam: Vitals:   01/01/18 0537  BP: (!) 158/69  Pulse: (!) 56  Temp: 98 F (36.7 C)  SpO2: 97%    GEN- The patient is well appearing, alert and oriented x 3 today.   Head- normocephalic, atraumatic Eyes-  Sclera clear, conjunctiva pink Ears- hearing intact Oropharynx- clear Lungs- Clear to ausculation bilaterally, normal work of breathing Heart- Regular rate and rhythm, no murmurs, rubs or gallops, PMI not laterally displaced GI- soft, NT, ND, + BS Extremities- no clubbing, cyanosis, or edema     Wt Readings from Last 3 Encounters:  11/28/17 186 lb (84.4 kg)  10/23/17 180 lb (81.6 kg)  09/16/17 180 lb (81.6 kg)    Assessment and Plan:  1.  Persistent afib/ atrial flutter afib has progressed to persistent.  She also has atrial flutter. She has occasional RVR but is V rates controlled today. chads2vasc score is 4.  She is on eliquis.  Risk, benefits, and alternatives to EP study and radiofrequency ablation for afib were discussed in detail with patient and spouse today. These risks include but are not limited to stroke, bleeding, vascular damage, tamponade, perforation, damage to the esophagus, lungs, and other structures, pulmonary vein stenosis, worsening renal function, and death. The patient understands these risk and wishes to proceed.  We will therefore proceed at this time.  Cardiac CT results reviewed with her today.  We also discussed insulin pump strategy at  length.  Thompson Grayer MD, [facility] 01/01/2018 7:12 AM

## 2018-01-02 ENCOUNTER — Encounter (INDEPENDENT_AMBULATORY_CARE_PROVIDER_SITE_OTHER): Payer: Medicare Other | Admitting: Ophthalmology

## 2018-01-02 ENCOUNTER — Encounter (HOSPITAL_COMMUNITY): Payer: Self-pay | Admitting: Internal Medicine

## 2018-01-02 MED FILL — Bupivacaine HCl Preservative Free (PF) Inj 0.25%: INTRAMUSCULAR | Qty: 30 | Status: AC

## 2018-01-03 NOTE — Telephone Encounter (Signed)
Returned call to Pt.  Advised Pt that Dr. Rayann Heman would not renew handicap placard.  Advised Dr. Rayann Heman would encourage Pt to park and walk as much as possible.  Pt indicates understanding.

## 2018-01-04 ENCOUNTER — Encounter (INDEPENDENT_AMBULATORY_CARE_PROVIDER_SITE_OTHER): Payer: Medicare Other | Admitting: Ophthalmology

## 2018-01-04 DIAGNOSIS — I10 Essential (primary) hypertension: Secondary | ICD-10-CM | POA: Diagnosis not present

## 2018-01-04 DIAGNOSIS — H35033 Hypertensive retinopathy, bilateral: Secondary | ICD-10-CM | POA: Diagnosis not present

## 2018-01-04 DIAGNOSIS — E103593 Type 1 diabetes mellitus with proliferative diabetic retinopathy without macular edema, bilateral: Secondary | ICD-10-CM | POA: Diagnosis not present

## 2018-01-04 DIAGNOSIS — E10319 Type 1 diabetes mellitus with unspecified diabetic retinopathy without macular edema: Secondary | ICD-10-CM | POA: Diagnosis not present

## 2018-01-08 ENCOUNTER — Other Ambulatory Visit (HOSPITAL_COMMUNITY): Payer: Self-pay | Admitting: Nurse Practitioner

## 2018-01-10 ENCOUNTER — Telehealth: Payer: Self-pay | Admitting: *Deleted

## 2018-01-10 ENCOUNTER — Ambulatory Visit (INDEPENDENT_AMBULATORY_CARE_PROVIDER_SITE_OTHER): Payer: Medicare Other | Admitting: Cardiovascular Disease

## 2018-01-10 ENCOUNTER — Encounter: Payer: Self-pay | Admitting: Cardiovascular Disease

## 2018-01-10 ENCOUNTER — Other Ambulatory Visit: Payer: Self-pay | Admitting: Cardiovascular Disease

## 2018-01-10 VITALS — BP 162/68 | HR 55 | Ht 67.0 in | Wt 193.4 lb

## 2018-01-10 DIAGNOSIS — G4733 Obstructive sleep apnea (adult) (pediatric): Secondary | ICD-10-CM

## 2018-01-10 DIAGNOSIS — E039 Hypothyroidism, unspecified: Secondary | ICD-10-CM

## 2018-01-10 DIAGNOSIS — I4892 Unspecified atrial flutter: Secondary | ICD-10-CM

## 2018-01-10 DIAGNOSIS — E1042 Type 1 diabetes mellitus with diabetic polyneuropathy: Secondary | ICD-10-CM | POA: Diagnosis not present

## 2018-01-10 DIAGNOSIS — I480 Paroxysmal atrial fibrillation: Secondary | ICD-10-CM | POA: Diagnosis not present

## 2018-01-10 DIAGNOSIS — E11319 Type 2 diabetes mellitus with unspecified diabetic retinopathy without macular edema: Secondary | ICD-10-CM | POA: Diagnosis not present

## 2018-01-10 DIAGNOSIS — R600 Localized edema: Secondary | ICD-10-CM

## 2018-01-10 DIAGNOSIS — E1165 Type 2 diabetes mellitus with hyperglycemia: Secondary | ICD-10-CM | POA: Diagnosis not present

## 2018-01-10 DIAGNOSIS — E108 Type 1 diabetes mellitus with unspecified complications: Secondary | ICD-10-CM | POA: Diagnosis not present

## 2018-01-10 DIAGNOSIS — E1022 Type 1 diabetes mellitus with diabetic chronic kidney disease: Secondary | ICD-10-CM | POA: Diagnosis not present

## 2018-01-10 DIAGNOSIS — Z23 Encounter for immunization: Secondary | ICD-10-CM | POA: Diagnosis not present

## 2018-01-10 DIAGNOSIS — Z794 Long term (current) use of insulin: Secondary | ICD-10-CM | POA: Diagnosis not present

## 2018-01-10 DIAGNOSIS — E7800 Pure hypercholesterolemia, unspecified: Secondary | ICD-10-CM

## 2018-01-10 DIAGNOSIS — N183 Chronic kidney disease, stage 3 (moderate): Secondary | ICD-10-CM | POA: Diagnosis not present

## 2018-01-10 NOTE — Telephone Encounter (Signed)
-----   Message from Caprice Beaver, LPN sent at [phone] 12:15 PM EST ----- Please order new mask ResMed N30i, DME: [employer] Thanks!

## 2018-01-10 NOTE — Progress Notes (Signed)
Cardiology Office Note    Date:  01/12/2018   ID:  Courtney Pearson, DOB 1950/11/19, MRN [id number]  PCP:  Darcus Austin, MD  Cardiologist:  Shelva Majestic, MD (sleep); Dr. Oval Linsey  New Sleep evaluation  History of Present Illness:  Courtney Pearson is a 67 y.o. female resents for initial sleep evaluation following being diagnosed with severe sleep apnea and instituting CPAP therapy.  Courtney Pearson is a 67 year old female patient of Dr. Oval Linsey who has a history of atrial flutter, atrial fibrillation hypertension, hyperlipidemia, and diabetes mellitus.  She had been seen in atrial fibrillation clinic, later by  Dr. Rayann Heman  and subsequently underwent ablation on January 01, 2018 for paroxysmal atrial fibrillation and isthmus dependent right atrial flutter.   Due to concerns for obstructive sleep apnea including daytime sleepiness, snoring, nonrestorative sleep, and her arrhythmias she was referred for a sleep study.  Her sleep study was done on September 16, 2017 and revealed severe sleep apnea with an AHI of 43.9/h.  Sleep apnea was more severe with rem sleep with an AHI at 68.6/h.  She had significant oxygen desaturation to a nadir of 79%.  She met split-night protocol and CPAP was implemented and titrated up to 9 cm of water pressure.  She has been on CPAP therapy since October 30, 2017 with [employer] as her DME company.  A download was obtained in the office today from November 18 through January 08, 2018.  This reveals excellent compliance with 100% of usage days.  Average CPAP use is 7 hours and 31 minutes.  At her 9 cm set pressure, AHI is excellent at 0.5.  Typically she goes to bed between 11:30 PM and midnight and wakes up at 7 AM.  She has been using nasal pillows.  There is no mask leak.  At times, she has noticed some mild irritation from the nasal pillows.  Since initiating CPAP therapy she feels improved.  She denies any residual daytime sleepiness, although initially her Epworth sleepiness  score was significantly elevated at 15.  She denies any chest pain.  Her previous 4-5 times per night nocturia has improved to 1 time per night.  She is more alert and has more energy.  She presents for evaluation.  Past Medical History:  Diagnosis Date  . Diabetes ([place])   . HTN (hypertension)   . Hyperlipidemia   . Hypothyroidism   . Palpitation   . Paroxysmal atrial fibrillation ([place]) 04/03/2017  . Renal insufficiency     Past Surgical History:  Procedure Laterality Date  . ATRIAL FIBRILLATION ABLATION N/A 01/01/2018   Procedure: ATRIAL FIBRILLATION ABLATION;  Surgeon: Thompson Grayer, MD;  Location: [facility];  Service: Cardiovascular;  Laterality: N/A;  . BREAST BIOPSY    . CARDIOVERSION N/A 04/30/2017   Procedure: CARDIOVERSION;  Surgeon: [name], MD;  Location: [facility];  Service: Cardiovascular;  Laterality: N/A;  . CESAREAN SECTION    . EYE SURGERY      Current Medications: Outpatient Medications Prior to Visit  Medication Sig Dispense Refill  . acetaminophen (TYLENOL) 500 MG tablet Take 500 mg by mouth daily as needed for moderate pain or headache.     Marland Kitchen apixaban (ELIQUIS) 5 MG TABS tablet Take 1 tablet (5 mg total) by mouth 2 (two) times daily. 180 tablet 2  . atorvastatin (LIPITOR) 10 MG tablet Take 10 mg by mouth every other day.     . calcium carbonate (TUMS - DOSED IN MG ELEMENTAL CALCIUM) 500  MG chewable tablet Chew 1 tablet by mouth 2 (two) times a week.    . cholecalciferol (VITAMIN D) 1000 units tablet Take 1,000 mg by mouth daily.    . fexofenadine (ALLEGRA) 180 MG tablet Take 180 mg by mouth daily as needed for allergies or rhinitis.    . furosemide (LASIX) 20 MG tablet Take 20 mg by mouth daily as needed for fluid.     . hydrocortisone cream 1 % Apply 1 application topically daily as needed for itching.    . insulin aspart (NOVOLOG) 100 UNIT/ML injection Inject 25 Units into the skin daily. Via insulin pump    . loperamide (IMODIUM A-D) 2 MG  tablet Take 2-4 mg by mouth as needed for diarrhea or loose stools.     . metoprolol tartrate (LOPRESSOR) 25 MG tablet Take 0.5 tablets (12.5 mg total) by mouth 2 (two) times daily. (BETA BLOCKER) 45 tablet 2  . Multiple Vitamins-Minerals (MULTIVITAMIN ADULT PO) Take 1 tablet by mouth daily.    . pantoprazole (PROTONIX) 40 MG tablet Take 1 tablet (40 mg total) by mouth daily. 45 tablet 0  . potassium chloride (K-DUR) 10 MEQ tablet Take 1 tablet (10 mEq total) by mouth daily. (Patient taking differently: Take 10 mEq by mouth every other day. ) 30 tablet 6  . SYNTHROID 75 MCG tablet Take 75 mcg by mouth daily before breakfast.      No facility-administered medications prior to visit.      Allergies:   Patient has no known allergies.   Social History   Socioeconomic History  . Marital status: Married    Spouse name: Not on file  . Number of children: 2  . Years of education: Not on file  . Highest education level: Not on file  Occupational History  . Not on file  Social Needs  . Financial resource strain: Not on file  . Food insecurity:    Worry: Not on file    Inability: Not on file  . Transportation needs:    Medical: Not on file    Non-medical: Not on file  Tobacco Use  . Smoking status: Never Smoker  . Smokeless tobacco: Never Used  Substance and Sexual Activity  . Alcohol use: No  . Drug use: Not on file  . Sexual activity: Not on file  Lifestyle  . Physical activity:    Days per week: Not on file    Minutes per session: Not on file  . Stress: Not on file  Relationships  . Social connections:    Talks on phone: Not on file    Gets together: Not on file    Attends religious service: Not on file    Active member of club or organization: Not on file    Attends meetings of clubs or organizations: Not on file    Relationship status: Not on file  Other Topics Concern  . Not on file  Social History Narrative  . Not on file    Additional social history is notable in  that she was born in [place].  She is married and has 2 children ages 28 and 63.  Family History:  The patient's family history includes Anemia in her father; Asthma in her sister; Heart disease in her unknown relative; Hypertension in her mother; Leukemia in her brother; Lupus in her child; OCD in her sister; Osteoarthritis in her mother and sister; Osteoporosis in her mother.  Both parents are deceased, father with significant immediate anemia and  died in his 39s.  Her mother died at 91.  She has 2 brothers and 1 sister.  ROS General: Negative; No fevers, chills, or night sweats;  HEENT: Negative; No changes in vision or hearing, sinus congestion, difficulty swallowing Pulmonary: Negative; No cough, wheezing, shortness of breath, hemoptysis Cardiovascular: History of a flutter/A. fib, status post ablation January 01, 2018 GI: Negative; No nausea, vomiting, diarrhea, or abdominal pain GU: Negative; No dysuria, hematuria, or difficulty voiding Musculoskeletal: Negative; no myalgias, joint pain, or weakness Hematologic/Oncology: Negative; no easy bruising, bleeding Endocrine: Negative; no heat/cold intolerance; no diabetes Neuro: Negative; no changes in balance, headaches Skin: Negative; No rashes or skin lesions Psychiatric: Negative; No behavioral problems, depression Sleep: See HPI, previous snoring, daytime fatigue, apnea, frequent nocturia ;  No bruxism, restless legs, hypnogognic hallucinations, no cataplexy Other comprehensive 14 point system review is negative.   PHYSICAL EXAM:   VS:  BP (!) 162/68   Pulse (!) 55   Ht _0  (1.702 m)   Wt 193 lb 6.4 oz (87.7 kg)   BMI 30.29 kg/m     Repeat blood pressure by me 134/72  Wt Readings from Last 3 Encounters:  01/10/18 193 lb 6.4 oz (87.7 kg)  01/01/18 190 lb (86.2 kg)  11/28/17 186 lb (84.4 kg)    General: Alert, oriented, no distress.  Skin: normal turgor, no rashes, warm and dry HEENT: Normocephalic,  atraumatic. Pupils equal round and reactive to light; sclera anicteric; extraocular muscles intact; no hemorrhages or exudates. Nose without nasal septal hypertrophy Mouth/Parynx benign; Mallinpatti scale 3 Neck: No JVD, no carotid bruits; normal carotid upstroke Lungs: clear to ausculatation and percussion; no wheezing or rales Chest wall: without tenderness to palpitation Heart: PMI not displaced, RRR, s1 s2 normal, 1/6 systolic murmur, no diastolic murmur, no rubs, gallops, thrills, or heaves Abdomen: soft, nontender; no hepatosplenomehaly, BS+; abdominal aorta nontender and not dilated by palpation. Back: no CVA tenderness Pulses 2+ Musculoskeletal: full range of motion, normal strength, no joint deformities Extremities: 2+ pitting edema bilateral lower extremities; no clubbing cyanosis, Homan's sign negative  Neurologic: grossly nonfocal; Cranial nerves grossly wnl Psychologic: Normal mood and affect   Studies/Labs Reviewed:   EKG:  EKG is ordered today.  ECG (independently read by me): Sinus bradycardia at 55 bpm.  Borderline first-degree block with appeared of low 206 Courtney.  No ectopy.  Normal intervals.  Recent Labs: BMP Latest Ref Rng & Units 12/24/2017 05/22/2017 04/20/2017  Glucose 65 - 99 mg/dL 268(H) 344(H) 260(H)  BUN 8 - 27 mg/dL 24 31(H) 25(H)  Creatinine 0.57 - 1.00 mg/dL 1.31(H) 1.38(H) 1.40(H)  BUN/Creat Ratio 12 - 28 18 - -  Sodium 134 - 144 mmol/L 136 136 138  Potassium 3.5 - 5.2 mmol/L 4.3 4.5 4.5  Chloride 96 - 106 mmol/L 102 104 105  CO2 20 - 29 mmol/L _1 Calcium 8.7 - 10.3 mg/dL 9.6 9.5 9.3     Hepatic Function Latest Ref Rng & Units 05/22/2017 04/20/2017  Total Protein 6.5 - 8.1 g/dL 6.3(L) 5.9(L)  Albumin 3.5 - 5.0 g/dL 3.8 3.4(L)  AST 15 - 41 U/L 39 67(H)  ALT 14 - 54 U/L 32 80(H)  Alk Phosphatase 38 - 126 U/L 184(H) 263(H)  Total Bilirubin 0.3 - 1.2 mg/dL 0.7 1.1    CBC Latest Ref Rng & Units 12/24/2017 05/22/2017 04/20/2017  WBC 3.4 - 10.8  x10E3/uL 4.9 5.8 4.9  Hemoglobin 11.1 - 15.9 g/dL 10.9(L) 12.3 11.3(L)  Hematocrit 34.0 -  46.6 % 32.9(L) 38.0 35.5(L)  Platelets 150 - 450 x10E3/uL 212 205 200   Lab Results  Component Value Date   MCV 94 12/24/2017   MCV 91.8 05/22/2017   MCV 93.2 04/20/2017   Lab Results  Component Value Date   TSH 4.906 (H) 04/20/2017   No results found for: HGBA1C   BNP No results found for: BNP  ProBNP No results found for: PROBNP   Lipid Panel  No results found for: CHOL, TRIG, HDL, CHOLHDL, VLDL, LDLCALC, LDLDIRECT   RADIOLOGY: Ct Cardiac Morph/pulm Vein W/cm&w/o Ca Score  Addendum Date: 12/25/2017   ADDENDUM REPORT: 12/25/2017 13:31 CLINICAL DATA:  Atrial fibrillation scheduled for an ablation. EXAM: Cardiac CT/CTA TECHNIQUE: The patient was scanned on a Siemens Force 983 slice  scanner. FINDINGS: A 120 kV prospective scan was triggered in the ascending thoracic aorta at 140 HU's. Gantry rotation speed was 250 msecs and collimation was .6 mm. No beta blockade and no NTG was given. The 3D data set was reconstructed for best systolic and diastolic phases along with delayed images of the LAA Images analyzed on a dedicated work station using MPR, MIP and VRT modes. The patient received 80 cc of contrast. The RA was mildly dilated There was severe pectus deformity which indented the AV groove. There was no LAA thrombus there was no ASD/VSD. There was no pericardial effusion. The aortic root was normal at 2.9 cm The esophagus coursed closest to the RLPV ostium There was only a single common left sided PV. RUPV: Ostium 18.4 mm  area 2.6 cm2 RLPV:  Ostium 16.6 mm  area 1.53 cm2 Common Left PV    ostium 20/2 mm  area 3.27 cm2 Calcium score There was dense 3 vessel coronary calcium Unable to accurately score as RCA unable to be differentiated from sternum due to pectus deformity. Score over 1000 before contamination by Sternal calcium IMPRESSION: 1.  No LAA thrombus 2.  No ASD/PFO 3.  Distorted right AV  groove with mild RAE due to pectus deformity 4.  Normal aortic root 2.9 cm 5.  No pericardial effusion 6.  Esophagus courses closest to the RLPV ostium 7.  Single common left sided PV 8 calcium score over 1000 unable to accurately measure due to RCA adjacent to sternum Consider perfusion study if clinically indicated [place] Rouge Electronically Signed   By: Jenkins Rouge M.D.   On: 12/25/2017 13:31   Result Date: 12/25/2017 EXAM: OVER-READ INTERPRETATION  CT CHEST The following report is an over-read performed by radiologist Dr. Vinnie Langton of [facility] [place] on 12/25/2017. This over-read does not include interpretation of cardiac or coronary anatomy or pathology. The coronary calcium score/coronary CTA interpretation by the cardiologist is attached. COMPARISON:  None. FINDINGS: Aortic atherosclerosis. Mild linear scarring in the right middle lobe and left lower lobe. Within the visualized portions of the thorax there are no suspicious appearing pulmonary nodules or masses, there is no acute consolidative airspace disease, no pleural effusions, no pneumothorax and no lymphadenopathy. Visualized portions of the upper abdomen are unremarkable. There are no aggressive appearing lytic or blastic lesions noted in the visualized portions of the skeleton. IMPRESSION: 1.  Aortic Atherosclerosis (ICD10-I70.0). Electronically Signed: By: Vinnie Langton M.D. On: 12/25/2017 11:49     Additional studies/ records that were reviewed today include:  I reviewed the patient's records from Dr. Oval Linsey, atrial fibrillation clinic, Dr. Rayann Heman, recent ablation, sleep study, and obtained a subsequent download.   ASSESSMENT:    1. OSA (  obstructive sleep apnea)   2. Paroxysmal atrial fibrillation (HCC)   3. Paroxysmal atrial flutter ([place])   4. Bilateral lower extremity edema   5. Pure hypercholesterolemia   6. Type 1 diabetes mellitus with complications (HCC)   7. Hypothyroidism, unspecified type      PLAN:  Courtney. Keyshia Orwick is a pleasant 67 year old female who has significant cardiovascular comorbidities including hypertension, hyperlipidemia, palpitations, atrial flutter and atrial fibrillation as well as type I diabetes mellitus on an insulin pump.   Due to concerns for obstructive sleep apnea a sleep study confirmed the presence of severe sleep apnea with an AHI of 43.9/h.  AHI during REM sleep was more pronounced with an AHI of 68.6/h.  She had significant oxygen desaturation to a nadir of 79%.  I discussed with her at length the association of severe untreated sleep apnea with nocturnal hypoxia as a contributor to nocturnal arrhythmias in particular atrial fibrillation.  We discussed the importance of effective therapy which can reduce recurrence rate of A. fib following both cardioversion as well as ablation.  I reviewed her sleep study in detail as well as her download.  At 9 cm water pressure her obstructive sleep apnea is significantly improved and her AHI is 0.5.  She has been using a nasal mask.  There is some irritation to her nares in particular the right upper nostril.  I have recommended we switch her mask to a ResMed AirFit N 30i.  This should prevent any irritation to the nares and also I discussed the benefit with the coil coming from the top of her head which will allow for improved mobility while sleeping.  Presently she feels well.  She is unaware of any breakthrough snoring.  Her daytime sleepiness has significantly improved.  Her blood pressure today was elevated initially and improved on recheck by me but still mildly elevated based on new criteria.  She also has 2+ pitting edema to her lower extremities bilaterally.  She has a prescription for furosemide but has not been taking this.  I have recommended that she take 20 mg daily.  I also have recommended support stockings up to below the knee.  She is maintaining sinus rhythm on metoprolol 12.5 twice a day.  She is  anticoagulated on Eliquis 5 mg twice a day.  She has a history of hypothyroidism on levothyroxine 75 mcg.  She is on atorvastatin 10 mg for hyperlipidemia with target LDL less than 70.  She will follow-up with Dr. Oval Linsey and also has a follow-up appointment scheduled in atrial fibrillation clinic in several weeks.  From a sleep perspective as long as she does well and tolerates her new mask I will see her in 1 year for reevaluation or sooner if problems arise.  Medication Adjustments/Labs and Tests Ordered: Current medicines are reviewed at length with the patient today.  Concerns regarding medicines are outlined above.  Medication changes, Labs and Tests ordered today are listed in the Patient Instructions below. Patient Instructions  Medication Instructions:  Start Lasix 20 mg daily. If you need a refill on your cardiac medications before your next appointment, please call your pharmacy.   Follow-Up: At [facility], you and your health needs are our priority.  As part of our continuing mission to provide you with exceptional heart care, we have created designated Provider Care Teams.  These Care Teams include your primary Cardiologist (physician) and Advanced Practice Providers (APPs -  Physician Assistants and Nurse Practitioners) who all work together to  provide you with the care you need, when you need it. You will need a follow up appointment in 12 months (sleep clinic).  Please call our office 2 months in advance to schedule this appointment.  You may see Dr.Rodrigues Urbanek (sleep clinic)        Signed, Shelva Majestic, MD  01/12/2018 2:51 PM    [facility] [address], [place], [place], [place]  [address] Phone: [phone]

## 2018-01-10 NOTE — Telephone Encounter (Signed)
Mask order placed into Epic. Message sent to [facility] @ [employer] via staff message informing her order has been placed.

## 2018-01-10 NOTE — Patient Instructions (Signed)
Medication Instructions:  Start Lasix 20 mg daily. If you need a refill on your cardiac medications before your next appointment, please call your pharmacy.   Follow-Up: At [facility], you and your health needs are our priority.  As part of our continuing mission to provide you with exceptional heart care, we have created designated Provider Care Teams.  These Care Teams include your primary Cardiologist (physician) and Advanced Practice Providers (APPs -  Physician Assistants and Nurse Practitioners) who all work together to provide you with the care you need, when you need it. You will need a follow up appointment in 12 months (sleep clinic).  Please call our office 2 months in advance to schedule this appointment.  You may see Dr.Kelly (sleep clinic)

## 2018-01-11 DIAGNOSIS — N183 Chronic kidney disease, stage 3 (moderate): Secondary | ICD-10-CM | POA: Diagnosis not present

## 2018-01-11 DIAGNOSIS — E1029 Type 1 diabetes mellitus with other diabetic kidney complication: Secondary | ICD-10-CM | POA: Diagnosis not present

## 2018-01-11 DIAGNOSIS — E1042 Type 1 diabetes mellitus with diabetic polyneuropathy: Secondary | ICD-10-CM | POA: Diagnosis not present

## 2018-01-11 DIAGNOSIS — E1065 Type 1 diabetes mellitus with hyperglycemia: Secondary | ICD-10-CM | POA: Diagnosis not present

## 2018-01-11 DIAGNOSIS — R809 Proteinuria, unspecified: Secondary | ICD-10-CM | POA: Diagnosis not present

## 2018-01-11 DIAGNOSIS — I10 Essential (primary) hypertension: Secondary | ICD-10-CM | POA: Diagnosis not present

## 2018-01-12 ENCOUNTER — Encounter: Payer: Self-pay | Admitting: Cardiovascular Disease

## 2018-01-29 ENCOUNTER — Ambulatory Visit (HOSPITAL_COMMUNITY)
Admission: RE | Admit: 2018-01-29 | Discharge: 2018-01-29 | Disposition: A | Discharge status: Home / Self Care | Payer: Medicare Other | Source: Ambulatory Visit | Attending: Nurse Practitioner | Admitting: Nurse Practitioner

## 2018-01-29 ENCOUNTER — Encounter (HOSPITAL_COMMUNITY): Payer: Self-pay | Admitting: Nurse Practitioner

## 2018-01-29 VITALS — BP 154/60 | HR 49 | Ht 67.0 in | Wt 189.0 lb

## 2018-01-29 DIAGNOSIS — E785 Hyperlipidemia, unspecified: Secondary | ICD-10-CM | POA: Insufficient documentation

## 2018-01-29 DIAGNOSIS — R001 Bradycardia, unspecified: Secondary | ICD-10-CM | POA: Insufficient documentation

## 2018-01-29 DIAGNOSIS — I10 Essential (primary) hypertension: Secondary | ICD-10-CM | POA: Insufficient documentation

## 2018-01-29 DIAGNOSIS — E039 Hypothyroidism, unspecified: Secondary | ICD-10-CM | POA: Diagnosis not present

## 2018-01-29 DIAGNOSIS — Z9889 Other specified postprocedural states: Secondary | ICD-10-CM | POA: Insufficient documentation

## 2018-01-29 DIAGNOSIS — I480 Paroxysmal atrial fibrillation: Secondary | ICD-10-CM | POA: Diagnosis not present

## 2018-01-29 DIAGNOSIS — E119 Type 2 diabetes mellitus without complications: Secondary | ICD-10-CM | POA: Insufficient documentation

## 2018-01-29 DIAGNOSIS — Z79899 Other long term (current) drug therapy: Secondary | ICD-10-CM | POA: Insufficient documentation

## 2018-01-29 NOTE — Progress Notes (Signed)
Primary Care Physician: Darcus Austin, MD Referring Physician: Dr. Lucina Mellow is a 68 y.o. female with a h/o DM, HTN, RI, paroxysmal afib in the afib clinic, for f/u of ablation one month ago. She is  having occasional bouts of irregular rhythm. She woke with an episode yesterday am but was over by lunch. She has noted slower HR's since the procedure. Today she is in SR in the 40's. Takes 12.5 mg of metoprolol tartrate bid.Was on flecainide before the procedure, it was stopped 2/2 being ineffective to maintain SR.   Today, she denies symptoms of palpitations, chest pain, shortness of breath, orthopnea, PND, lower extremity edema, dizziness, presyncope, syncope, or neurologic sequela. The patient is tolerating medications without difficulties and is otherwise without complaint today.   Past Medical History:  Diagnosis Date  . Diabetes ([place])   . HTN (hypertension)   . Hyperlipidemia   . Hypothyroidism   . Palpitation   . Paroxysmal atrial fibrillation ([place]) 04/03/2017  . Renal insufficiency    Past Surgical History:  Procedure Laterality Date  . ATRIAL FIBRILLATION ABLATION N/A 01/01/2018   Procedure: ATRIAL FIBRILLATION ABLATION;  Surgeon: Thompson Grayer, MD;  Location: [facility];  Service: Cardiovascular;  Laterality: N/A;  . BREAST BIOPSY    . CARDIOVERSION N/A 04/30/2017   Procedure: CARDIOVERSION;  Surgeon: [name], MD;  Location: [facility];  Service: Cardiovascular;  Laterality: N/A;  . CESAREAN SECTION    . EYE SURGERY      Current Outpatient Medications  Medication Sig Dispense Refill  . acetaminophen (TYLENOL) 500 MG tablet Take 500 mg by mouth daily as needed for moderate pain or headache.     Marland Kitchen apixaban (ELIQUIS) 5 MG TABS tablet Take 1 tablet (5 mg total) by mouth 2 (two) times daily. 180 tablet 2  . atorvastatin (LIPITOR) 10 MG tablet Take 10 mg by mouth every other day.     . calcium carbonate (TUMS - DOSED IN MG ELEMENTAL CALCIUM) 500  MG chewable tablet Chew 1 tablet by mouth 2 (two) times a week.    . cholecalciferol (VITAMIN D) 1000 units tablet Take 1,000 mg by mouth daily.    . fexofenadine (ALLEGRA) 180 MG tablet Take 180 mg by mouth daily as needed for allergies or rhinitis.    . furosemide (LASIX) 20 MG tablet Take 20 mg by mouth daily as needed for fluid.     . hydrocortisone cream 1 % Apply 1 application topically daily as needed for itching.    . insulin aspart (NOVOLOG) 100 UNIT/ML injection Inject 25 Units into the skin daily. Via insulin pump    . loperamide (IMODIUM A-D) 2 MG tablet Take 2-4 mg by mouth as needed for diarrhea or loose stools.     Marland Kitchen losartan (COZAAR) 25 MG tablet Take 12.5 mg by mouth at bedtime.    . metoprolol tartrate (LOPRESSOR) 25 MG tablet Take 0.5 tablets (12.5 mg total) by mouth 2 (two) times daily. (BETA BLOCKER) 45 tablet 2  . Multiple Vitamins-Minerals (MULTIVITAMIN ADULT PO) Take 1 tablet by mouth daily.    . pantoprazole (PROTONIX) 40 MG tablet Take 1 tablet (40 mg total) by mouth daily. 45 tablet 0  . potassium chloride (K-DUR) 10 MEQ tablet Take 1 tablet (10 mEq total) by mouth daily. (Patient taking differently: Take 10 mEq by mouth every other day. ) 30 tablet 6  . SYNTHROID 75 MCG tablet Take 75 mcg by mouth daily before breakfast.  No current facility-administered medications for this encounter.     No Known Allergies  Social History   Socioeconomic History  . Marital status: Married    Spouse name: Not on file  . Number of children: 2  . Years of education: Not on file  . Highest education level: Not on file  Occupational History  . Not on file  Social Needs  . Financial resource strain: Not on file  . Food insecurity:    Worry: Not on file    Inability: Not on file  . Transportation needs:    Medical: Not on file    Non-medical: Not on file  Tobacco Use  . Smoking status: Never Smoker  . Smokeless tobacco: Never Used  Substance and Sexual Activity  .  Alcohol use: No  . Drug use: Not on file  . Sexual activity: Not on file  Lifestyle  . Physical activity:    Days per week: Not on file    Minutes per session: Not on file  . Stress: Not on file  Relationships  . Social connections:    Talks on phone: Not on file    Gets together: Not on file    Attends religious service: Not on file    Active member of club or organization: Not on file    Attends meetings of clubs or organizations: Not on file    Relationship status: Not on file  . Intimate partner violence:    Fear of current or ex partner: Not on file    Emotionally abused: Not on file    Physically abused: Not on file    Forced sexual activity: Not on file  Other Topics Concern  . Not on file  Social History Narrative  . Not on file    Family History  Problem Relation Age of Onset  . Heart disease Unknown        No family history  . Hypertension Mother   . Osteoarthritis Mother   . Osteoporosis Mother   . Anemia Father   . Asthma Sister   . Osteoarthritis Sister   . OCD Sister   . Leukemia Brother   . Lupus Child     ROS- All systems are reviewed and negative except as per the HPI above  Physical Exam: Vitals:   01/29/18 1120  BP: (!) 154/60  Pulse: (!) 49  Weight: 85.7 kg  Height: 5\' 7"  (1.702 m)   Wt Readings from Last 3 Encounters:  01/29/18 85.7 kg  01/10/18 87.7 kg  01/01/18 86.2 kg    Labs: Lab Results  Component Value Date   NA 136 12/24/2017   K 4.3 12/24/2017   CL 102 12/24/2017   CO2 22 12/24/2017   GLUCOSE 268 (H) 12/24/2017   BUN 24 12/24/2017   CREATININE 1.31 (H) 12/24/2017   CALCIUM 9.6 12/24/2017   No results found for: INR No results found for: CHOL, HDL, LDLCALC, TRIG   GEN- The patient is well appearing, alert and oriented x 3 today.   Head- normocephalic, atraumatic Eyes-  Sclera clear, conjunctiva pink Ears- hearing intact Oropharynx- clear Neck- supple, no JVP Lymph- no cervical lymphadenopathy Lungs- Clear to  ausculation bilaterally, normal work of breathing Heart- Slow regular rate and rhythm, no murmurs, rubs or gallops, PMI not laterally displaced GI- soft, NT, ND, + BS Extremities- no clubbing, cyanosis, or edema MS- no significant deformity or atrophy Skin- no rash or lesion Psych- euthymic mood, full affect Neuro- strength and sensation  are intact  EKG-Sinus brady at 49 bpm, pr int 216 ms, qrs int 80 ms, qtc 417 ms     Assessment and Plan: 1. Paroxysmal afib  For most part maintaining SR but still a few episodes of afib  2. Bradycardia Tolerating HR OK in the 40's, hesitate to stop daily metoprolol as she is still having some afib She will follow with BP/HR checks at home  3. CHA2DS2VASc score of att least 4 Aware not to interrupt anticoagulation for any  reason over the next 2 months  F/u with Dr. Rayann Heman  3/13 afib clinic as needed  Butch Penny C. Remijio Holleran, [facility] [address] [place], [place] [address] [phone]

## 2018-02-05 ENCOUNTER — Telehealth (HOSPITAL_COMMUNITY): Payer: Self-pay | Admitting: *Deleted

## 2018-02-05 NOTE — Telephone Encounter (Signed)
Pt called in stating since Sunday her HR has been elevated in the 120s. Increased shortness of breath. BP 105/79 HR 138. Discussed with Roderic Palau NP - will increase metoprolol to 25mg  BID and see tomorrow encouraged to drink extra water to help with BP.

## 2018-02-06 ENCOUNTER — Encounter (HOSPITAL_COMMUNITY): Payer: Self-pay | Admitting: Nurse Practitioner

## 2018-02-06 ENCOUNTER — Ambulatory Visit (HOSPITAL_COMMUNITY)
Admission: RE | Admit: 2018-02-06 | Discharge: 2018-02-06 | Disposition: A | Discharge status: Home / Self Care | Payer: Medicare Other | Source: Ambulatory Visit | Attending: Nurse Practitioner | Admitting: Nurse Practitioner

## 2018-02-06 VITALS — BP 120/62 | HR 85 | Ht 67.0 in | Wt 199.0 lb

## 2018-02-06 DIAGNOSIS — Z794 Long term (current) use of insulin: Secondary | ICD-10-CM | POA: Diagnosis not present

## 2018-02-06 DIAGNOSIS — I480 Paroxysmal atrial fibrillation: Secondary | ICD-10-CM | POA: Insufficient documentation

## 2018-02-06 DIAGNOSIS — Z8249 Family history of ischemic heart disease and other diseases of the circulatory system: Secondary | ICD-10-CM | POA: Insufficient documentation

## 2018-02-06 DIAGNOSIS — I10 Essential (primary) hypertension: Secondary | ICD-10-CM | POA: Insufficient documentation

## 2018-02-06 DIAGNOSIS — Z79899 Other long term (current) drug therapy: Secondary | ICD-10-CM | POA: Insufficient documentation

## 2018-02-06 DIAGNOSIS — E039 Hypothyroidism, unspecified: Secondary | ICD-10-CM | POA: Diagnosis not present

## 2018-02-06 DIAGNOSIS — I4892 Unspecified atrial flutter: Secondary | ICD-10-CM | POA: Insufficient documentation

## 2018-02-06 DIAGNOSIS — E119 Type 2 diabetes mellitus without complications: Secondary | ICD-10-CM | POA: Diagnosis not present

## 2018-02-06 DIAGNOSIS — R001 Bradycardia, unspecified: Secondary | ICD-10-CM | POA: Diagnosis not present

## 2018-02-06 DIAGNOSIS — E785 Hyperlipidemia, unspecified: Secondary | ICD-10-CM | POA: Diagnosis not present

## 2018-02-06 DIAGNOSIS — R9431 Abnormal electrocardiogram [ECG] [EKG]: Secondary | ICD-10-CM | POA: Diagnosis not present

## 2018-02-06 NOTE — Patient Instructions (Signed)
Cardioversion scheduled for Friday, January 24th  - Come to afib clinic at 930 for labs  - Arrive at the [facility] and go to admitting at 10  -Do not eat or drink anything after midnight the night prior to your procedure.  - Take all your morning medication with a sip of water prior to arrival.  - You will not be able to drive home after your procedure.

## 2018-02-06 NOTE — H&P (View-Only) (Signed)
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 female with a h/o DM, HTN, RI, paroxysmal afib in the afib clinic, for f/u of ablation one month ago. She is  having occasional bouts of irregular rhythm. She woke with an episode yesterday am but was over by lunch. She has noted slower HR's since the procedure. Today she is in SR in the 40's. Takes 12.5 mg of metoprolol tartrate bid.Was on flecainide before the procedure, it was stopped 2/2 being ineffective to maintain SR.   F/u in afib office, 1/15. She went out of rhythm yesterday.  No know trigger. When she called the office yesterday, we asked her to increase BB. She is rate controlled today but EKG shows atrial flutter.  Today, she denies symptoms of palpitations, chest pain, shortness of breath, orthopnea, PND, lower extremity edema, dizziness, presyncope, syncope, or neurologic sequela. The patient is tolerating medications without difficulties and is otherwise without complaint today.   Past Medical History:  Diagnosis Date  . Diabetes ([place])   . HTN (hypertension)   . Hyperlipidemia   . Hypothyroidism   . Palpitation   . Paroxysmal atrial fibrillation ([place]) 04/03/2017  . Renal insufficiency    Past Surgical History:  Procedure Laterality Date  . ATRIAL FIBRILLATION ABLATION N/A 01/01/2018   Procedure: ATRIAL FIBRILLATION ABLATION;  Surgeon: Thompson Grayer, MD;  Location: [facility];  Service: Cardiovascular;  Laterality: N/A;  . BREAST BIOPSY    . CARDIOVERSION N/A 04/30/2017   Procedure: CARDIOVERSION;  Surgeon: [name], MD;  Location: [facility];  Service: Cardiovascular;  Laterality: N/A;  . CESAREAN SECTION    . EYE SURGERY      Current Outpatient Medications  Medication Sig Dispense Refill  . acetaminophen (TYLENOL) 500 MG tablet Take 500 mg by mouth daily as needed for moderate pain or headache.     Marland Kitchen apixaban (ELIQUIS) 5 MG TABS  tablet Take 1 tablet (5 mg total) by mouth 2 (two) times daily. 180 tablet 2  . atorvastatin (LIPITOR) 10 MG tablet Take 10 mg by mouth every other day.     . calcium carbonate (TUMS - DOSED IN MG ELEMENTAL CALCIUM) 500 MG chewable tablet Chew 1 tablet by mouth 2 (two) times a week.    . cholecalciferol (VITAMIN D) 1000 units tablet Take 1,000 mg by mouth daily.    . fexofenadine (ALLEGRA) 180 MG tablet Take 180 mg by mouth daily as needed for allergies or rhinitis.    . furosemide (LASIX) 20 MG tablet Take 20 mg by mouth daily as needed for fluid.     . hydrocortisone cream 1 % Apply 1 application topically daily as needed for itching.    . insulin aspart (NOVOLOG) 100 UNIT/ML injection Inject 25 Units into the skin daily. Via insulin pump    . loperamide (IMODIUM A-D) 2 MG tablet Take 2-4 mg by mouth as needed for diarrhea or loose stools.     Marland Kitchen losartan (COZAAR) 25 MG tablet Take 12.5 mg by mouth at bedtime.    . metoprolol tartrate (LOPRESSOR) 25 MG tablet Take 0.5 tablets (12.5 mg total) by mouth 2 (two) times daily. (BETA BLOCKER) (Patient taking differently: Take 25 mg by mouth 2 (two) times daily. (BETA BLOCKER)) 45 tablet 2  . Multiple Vitamins-Minerals (MULTIVITAMIN ADULT PO) Take 1 tablet by mouth daily.    . pantoprazole (PROTONIX) 40 MG tablet Take 1 tablet (40 mg total)  by mouth daily. 45 tablet 0  . potassium chloride (K-DUR) 10 MEQ tablet Take 1 tablet (10 mEq total) by mouth daily. (Patient taking differently: Take 10 mEq by mouth every other day. ) 30 tablet 6  . SYNTHROID 75 MCG tablet Take 75 mcg by mouth daily before breakfast.      No current facility-administered medications for this encounter.     No Known Allergies  Social History   Socioeconomic History  . Marital status: Married    Spouse name: Not on file  . Number of children: 2  . Years of education: Not on file  . Highest education level: Not on file  Occupational History  . Not on file  Social Needs  .  Financial resource strain: Not on file  . Food insecurity:    Worry: Not on file    Inability: Not on file  . Transportation needs:    Medical: Not on file    Non-medical: Not on file  Tobacco Use  . Smoking status: Never Smoker  . Smokeless tobacco: Never Used  Substance and Sexual Activity  . Alcohol use: No  . Drug use: Not on file  . Sexual activity: Not on file  Lifestyle  . Physical activity:    Days per week: Not on file    Minutes per session: Not on file  . Stress: Not on file  Relationships  . Social connections:    Talks on phone: Not on file    Gets together: Not on file    Attends religious service: Not on file    Active member of club or organization: Not on file    Attends meetings of clubs or organizations: Not on file    Relationship status: Not on file  . Intimate partner violence:    Fear of current or ex partner: Not on file    Emotionally abused: Not on file    Physically abused: Not on file    Forced sexual activity: Not on file  Other Topics Concern  . Not on file  Social History Narrative  . Not on file    Family History  Problem Relation Age of Onset  . Heart disease Unknown        No family history  . Hypertension Mother   . Osteoarthritis Mother   . Osteoporosis Mother   . Anemia Father   . Asthma Sister   . Osteoarthritis Sister   . OCD Sister   . Leukemia Brother   . Lupus Child     ROS- All systems are reviewed and negative except as per the HPI above  Physical Exam: Vitals:   02/06/18 1436  BP: 120/62  Pulse: 85  Weight: 90.3 kg  Height: 5\' 7"  (1.702 m)   Wt Readings from Last 3 Encounters:  02/06/18 90.3 kg  01/29/18 85.7 kg  01/10/18 87.7 kg    Labs: Lab Results  Component Value Date   NA 136 12/24/2017   K 4.3 12/24/2017   CL 102 12/24/2017   CO2 22 12/24/2017   GLUCOSE 268 (H) 12/24/2017   BUN 24 12/24/2017   CREATININE 1.31 (H) 12/24/2017   CALCIUM 9.6 12/24/2017   No results found for: INR No  results found for: CHOL, HDL, LDLCALC, TRIG   GEN- The patient is well appearing, alert and oriented x 3 today.   Head- normocephalic, atraumatic Eyes-  Sclera clear, conjunctiva pink Ears- hearing intact Oropharynx- clear Neck- supple, no JVP Lymph- no cervical lymphadenopathy Lungs-  Clear to ausculation bilaterally, normal work of breathing Heart- irregular rate and rhythm, no murmurs, rubs or gallops, PMI not laterally displaced GI- soft, NT, ND, + BS Extremities- no clubbing, cyanosis, or edema MS- no significant deformity or atrophy Skin- no rash or lesion Psych- euthymic mood, full affect Neuro- strength and sensation are intact  EKG- atrial flutter at 80's 90's per mntue     Assessment and Plan: 1. Paroxysmal afib/flutter  S/p afib 12/10 ablation  Went back out of rhythm 1/14 Metoprolol increased to 25 mg bid  Will  plan on cardioversion, no missed anticoagulation since 12/26, eligible for DCCV after after 1/17 Scheduled for  1 /24, hopefully, she will return to SR on her own, if she does will need to decrease BB back to 1/2 tab bid as she had brady in the 40's in SR, will decrease back to 12.5 mg bid on am of cardioversion  2. Bradycardia Tolerating HR OK in the 40's when in rhythm   3. CHA2DS2VASc score of at least 4 Aware not to interrupt anticoagulation    F/u with Dr. Rayann Heman  3/13 afib clinic am of cardioversion for lab/ekg  Butch Penny C. Carroll, [facility] [address] [place], [place] [address] [phone]

## 2018-02-06 NOTE — Progress Notes (Addendum)
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 female with a h/o DM, HTN, RI, paroxysmal afib in the afib clinic, for f/u of ablation one month ago. She is  having occasional bouts of irregular rhythm. She woke with an episode yesterday am but was over by lunch. She has noted slower HR's since the procedure. Today she is in SR in the 40's. Takes 12.5 mg of metoprolol tartrate bid.Was on flecainide before the procedure, it was stopped 2/2 being ineffective to maintain SR.   F/u in afib office, 1/15. She went out of rhythm yesterday.  No know trigger. When she called the office yesterday, we asked her to increase BB. She is rate controlled today but EKG shows atrial flutter.  Today, she denies symptoms of palpitations, chest pain, shortness of breath, orthopnea, PND, lower extremity edema, dizziness, presyncope, syncope, or neurologic sequela. The patient is tolerating medications without difficulties and is otherwise without complaint today.   Past Medical History:  Diagnosis Date  . Diabetes ([place])   . HTN (hypertension)   . Hyperlipidemia   . Hypothyroidism   . Palpitation   . Paroxysmal atrial fibrillation ([place]) 04/03/2017  . Renal insufficiency    Past Surgical History:  Procedure Laterality Date  . ATRIAL FIBRILLATION ABLATION N/A 01/01/2018   Procedure: ATRIAL FIBRILLATION ABLATION;  Surgeon: Thompson Grayer, MD;  Location: [facility] [facility];  Service: Cardiovascular;  Laterality: N/A;  . BREAST BIOPSY    . CARDIOVERSION N/A 04/30/2017   Procedure: CARDIOVERSION;  Surgeon: [name], MD;  Location: [facility];  Service: Cardiovascular;  Laterality: N/A;  . CESAREAN SECTION    . EYE SURGERY      Current Outpatient Medications  Medication Sig Dispense Refill  . acetaminophen (TYLENOL) 500 MG tablet Take 500 mg by mouth daily as needed for moderate pain or headache.     Marland Kitchen apixaban (ELIQUIS) 5 MG TABS  tablet Take 1 tablet (5 mg total) by mouth 2 (two) times daily. 180 tablet 2  . atorvastatin (LIPITOR) 10 MG tablet Take 10 mg by mouth every other day.     . calcium carbonate (TUMS - DOSED IN MG ELEMENTAL CALCIUM) 500 MG chewable tablet Chew 1 tablet by mouth 2 (two) times a week.    . cholecalciferol (VITAMIN D) 1000 units tablet Take 1,000 mg by mouth daily.    . fexofenadine (ALLEGRA) 180 MG tablet Take 180 mg by mouth daily as needed for allergies or rhinitis.    . furosemide (LASIX) 20 MG tablet Take 20 mg by mouth daily as needed for fluid.     . hydrocortisone cream 1 % Apply 1 application topically daily as needed for itching.    . insulin aspart (NOVOLOG) 100 UNIT/ML injection Inject 25 Units into the skin daily. Via insulin pump    . loperamide (IMODIUM A-D) 2 MG tablet Take 2-4 mg by mouth as needed for diarrhea or loose stools.     Marland Kitchen losartan (COZAAR) 25 MG tablet Take 12.5 mg by mouth at bedtime.    . metoprolol tartrate (LOPRESSOR) 25 MG tablet Take 0.5 tablets (12.5 mg total) by mouth 2 (two) times daily. (BETA BLOCKER) (Patient taking differently: Take 25 mg by mouth 2 (two) times daily. (BETA BLOCKER)) 45 tablet 2  . Multiple Vitamins-Minerals (MULTIVITAMIN ADULT PO) Take 1 tablet by mouth daily.    . pantoprazole (PROTONIX) 40 MG tablet Take 1 tablet (40 mg total)  by mouth daily. 45 tablet 0  . potassium chloride (K-DUR) 10 MEQ tablet Take 1 tablet (10 mEq total) by mouth daily. (Patient taking differently: Take 10 mEq by mouth every other day. ) 30 tablet 6  . SYNTHROID 75 MCG tablet Take 75 mcg by mouth daily before breakfast.      No current facility-administered medications for this encounter.     No Known Allergies  Social History   Socioeconomic History  . Marital status: Married    Spouse name: Not on file  . Number of children: 2  . Years of education: Not on file  . Highest education level: Not on file  Occupational History  . Not on file  Social Needs  .  Financial resource strain: Not on file  . Food insecurity:    Worry: Not on file    Inability: Not on file  . Transportation needs:    Medical: Not on file    Non-medical: Not on file  Tobacco Use  . Smoking status: Never Smoker  . Smokeless tobacco: Never Used  Substance and Sexual Activity  . Alcohol use: No  . Drug use: Not on file  . Sexual activity: Not on file  Lifestyle  . Physical activity:    Days per week: Not on file    Minutes per session: Not on file  . Stress: Not on file  Relationships  . Social connections:    Talks on phone: Not on file    Gets together: Not on file    Attends religious service: Not on file    Active member of club or organization: Not on file    Attends meetings of clubs or organizations: Not on file    Relationship status: Not on file  . Intimate partner violence:    Fear of current or ex partner: Not on file    Emotionally abused: Not on file    Physically abused: Not on file    Forced sexual activity: Not on file  Other Topics Concern  . Not on file  Social History Narrative  . Not on file    Family History  Problem Relation Age of Onset  . Heart disease Unknown        No family history  . Hypertension Mother   . Osteoarthritis Mother   . Osteoporosis Mother   . Anemia Father   . Asthma Sister   . Osteoarthritis Sister   . OCD Sister   . Leukemia Brother   . Lupus Child     ROS- All systems are reviewed and negative except as per the HPI above  Physical Exam: Vitals:   02/06/18 1436  BP: 120/62  Pulse: 85  Weight: 90.3 kg  Height: 5\' 7"  (1.702 m)   Wt Readings from Last 3 Encounters:  02/06/18 90.3 kg  01/29/18 85.7 kg  01/10/18 87.7 kg    Labs: Lab Results  Component Value Date   NA 136 12/24/2017   K 4.3 12/24/2017   CL 102 12/24/2017   CO2 22 12/24/2017   GLUCOSE 268 (H) 12/24/2017   BUN 24 12/24/2017   CREATININE 1.31 (H) 12/24/2017   CALCIUM 9.6 12/24/2017   No results found for: INR No  results found for: CHOL, HDL, LDLCALC, TRIG   GEN- The patient is well appearing, alert and oriented x 3 today.   Head- normocephalic, atraumatic Eyes-  Sclera clear, conjunctiva pink Ears- hearing intact Oropharynx- clear Neck- supple, no JVP Lymph- no cervical lymphadenopathy Lungs-  Clear to ausculation bilaterally, normal work of breathing Heart- irregular rate and rhythm, no murmurs, rubs or gallops, PMI not laterally displaced GI- soft, NT, ND, + BS Extremities- no clubbing, cyanosis, or edema MS- no significant deformity or atrophy Skin- no rash or lesion Psych- euthymic mood, full affect Neuro- strength and sensation are intact  EKG- atrial flutter at 80's 90's per mntue     Assessment and Plan: 1. Paroxysmal afib/flutter  S/p afib 12/10 ablation  Went back out of rhythm 1/14 Metoprolol increased to 25 mg bid  Will  plan on cardioversion, no missed anticoagulation since 12/26, eligible for DCCV after after 1/17 Scheduled for  1 /24, hopefully, she will return to SR on her own, if she does will need to decrease BB back to 1/2 tab bid as she had brady in the 40's in SR, will decrease back to 12.5 mg bid on am of cardioversion  2. Bradycardia Tolerating HR OK in the 40's when in rhythm   3. CHA2DS2VASc score of at least 4 Aware not to interrupt anticoagulation    F/u with Dr. Rayann Heman  3/13 afib clinic am of cardioversion for lab/ekg  Butch Penny C. Koryn Charlot, [facility] [address] [place], [place] [address] [phone]

## 2018-02-15 ENCOUNTER — Encounter (HOSPITAL_COMMUNITY)
Admission: RE | Disposition: A | Discharge status: Home / Self Care | Payer: Self-pay | Source: Ambulatory Visit | Attending: Cardiovascular Disease

## 2018-02-15 ENCOUNTER — Encounter (HOSPITAL_COMMUNITY): Payer: Self-pay | Admitting: Cardiovascular Disease

## 2018-02-15 ENCOUNTER — Ambulatory Visit (HOSPITAL_COMMUNITY)
Admission: RE | Admit: 2018-02-15 | Discharge: 2018-02-15 | Disposition: A | Discharge status: Home / Self Care | Payer: Medicare Other | Source: Ambulatory Visit | Attending: Nurse Practitioner | Admitting: Nurse Practitioner

## 2018-02-15 ENCOUNTER — Ambulatory Visit (HOSPITAL_COMMUNITY): Payer: Medicare Other | Admitting: Anesthesiology

## 2018-02-15 ENCOUNTER — Ambulatory Visit (HOSPITAL_COMMUNITY)
Admission: RE | Admit: 2018-02-15 | Discharge: 2018-02-15 | Disposition: A | Discharge status: Home / Self Care | Payer: Medicare Other | Source: Ambulatory Visit | Attending: Cardiovascular Disease | Admitting: Cardiovascular Disease

## 2018-02-15 VITALS — HR 143

## 2018-02-15 DIAGNOSIS — Z7901 Long term (current) use of anticoagulants: Secondary | ICD-10-CM | POA: Insufficient documentation

## 2018-02-15 DIAGNOSIS — Z794 Long term (current) use of insulin: Secondary | ICD-10-CM | POA: Insufficient documentation

## 2018-02-15 DIAGNOSIS — I480 Paroxysmal atrial fibrillation: Secondary | ICD-10-CM

## 2018-02-15 DIAGNOSIS — Z79899 Other long term (current) drug therapy: Secondary | ICD-10-CM | POA: Insufficient documentation

## 2018-02-15 DIAGNOSIS — E119 Type 2 diabetes mellitus without complications: Secondary | ICD-10-CM | POA: Diagnosis not present

## 2018-02-15 DIAGNOSIS — I4819 Other persistent atrial fibrillation: Secondary | ICD-10-CM | POA: Diagnosis not present

## 2018-02-15 DIAGNOSIS — I4892 Unspecified atrial flutter: Secondary | ICD-10-CM | POA: Diagnosis not present

## 2018-02-15 DIAGNOSIS — E785 Hyperlipidemia, unspecified: Secondary | ICD-10-CM | POA: Insufficient documentation

## 2018-02-15 DIAGNOSIS — R001 Bradycardia, unspecified: Secondary | ICD-10-CM | POA: Diagnosis not present

## 2018-02-15 DIAGNOSIS — I10 Essential (primary) hypertension: Secondary | ICD-10-CM | POA: Insufficient documentation

## 2018-02-15 DIAGNOSIS — E039 Hypothyroidism, unspecified: Secondary | ICD-10-CM | POA: Insufficient documentation

## 2018-02-15 HISTORY — PX: CARDIOVERSION: SHX1299

## 2018-02-15 LAB — POCT I-STAT 4, (NA,K, GLUC, HGB,HCT)
GLUCOSE: 179 mg/dL — AB (ref 70–99)
HCT: 33 % — ABNORMAL LOW (ref 36.0–46.0)
Hemoglobin: 11.2 g/dL — ABNORMAL LOW (ref 12.0–15.0)
Potassium: 4.2 mmol/L (ref 3.5–5.1)
Sodium: 140 mmol/L (ref 135–145)

## 2018-02-15 LAB — BASIC METABOLIC PANEL
Anion gap: 8 (ref 5–15)
BUN: 25 mg/dL — ABNORMAL HIGH (ref 8–23)
CO2: 26 mmol/L (ref 22–32)
Calcium: 9.7 mg/dL (ref 8.9–10.3)
Chloride: 106 mmol/L (ref 98–111)
Creatinine, Ser: 1.47 mg/dL — ABNORMAL HIGH (ref 0.44–1.00)
GFR calc Af Amer: 42 mL/min — ABNORMAL LOW (ref >60)
GFR calc non Af Amer: 36 mL/min — ABNORMAL LOW (ref >60)
GLUCOSE: 179 mg/dL — AB (ref 70–99)
Potassium: 4.3 mmol/L (ref 3.5–5.1)
Sodium: 140 mmol/L (ref 135–145)

## 2018-02-15 LAB — CBC WITH DIFFERENTIAL/PLATELET
Abs Immature Granulocytes: 0.02 10*3/uL (ref 0.00–0.07)
Basophils Absolute: 0 10*3/uL (ref 0.0–0.1)
Basophils Relative: 0 %
Eosinophils Absolute: 0.3 10*3/uL (ref 0.0–0.5)
Eosinophils Relative: 6 %
HEMATOCRIT: 35 % — AB (ref 36.0–46.0)
Hemoglobin: 10.8 g/dL — ABNORMAL LOW (ref 12.0–15.0)
Immature Granulocytes: 0 %
LYMPHS ABS: 0.9 10*3/uL (ref 0.7–4.0)
Lymphocytes Relative: 19 %
MCH: 29.5 pg (ref 26.0–34.0)
MCHC: 30.9 g/dL (ref 30.0–36.0)
MCV: 95.6 fL (ref 80.0–100.0)
Monocytes Absolute: 0.4 10*3/uL (ref 0.1–1.0)
Monocytes Relative: 8 %
Neutro Abs: 3.3 10*3/uL (ref 1.7–7.7)
Neutrophils Relative %: 67 %
Platelets: 190 10*3/uL (ref 150–400)
RBC: 3.66 MIL/uL — ABNORMAL LOW (ref 3.87–5.11)
RDW: 14.1 % (ref 11.5–15.5)
WBC: 4.9 10*3/uL (ref 4.0–10.5)
nRBC: 0 % (ref 0.0–0.2)

## 2018-02-15 SURGERY — CARDIOVERSION
Anesthesia: General

## 2018-02-15 MED ORDER — PROPOFOL 10 MG/ML IV BOLUS
INTRAVENOUS | Status: DC | PRN
  Administered 2018-02-15: 70 mg via INTRAVENOUS

## 2018-02-15 MED ORDER — LIDOCAINE 2% (20 MG/ML) 5 ML SYRINGE
INTRAMUSCULAR | Status: DC | PRN
  Administered 2018-02-15: 60 mg via INTRAVENOUS

## 2018-02-15 MED ORDER — ATROPINE SULFATE 0.4 MG/ML IV SOSY
PREFILLED_SYRINGE | INTRAVENOUS | Status: DC | PRN
  Administered 2018-02-15 (×2): .6 mg via INTRAVENOUS

## 2018-02-15 MED ORDER — CALCIUM CHLORIDE 10 % IV SOLN
INTRAVENOUS | Status: DC | PRN
  Administered 2018-02-15 (×2): 100 mg via INTRAVENOUS
  Administered 2018-02-15: 200 mg via INTRAVENOUS

## 2018-02-15 MED ORDER — SODIUM CHLORIDE 0.9 % IV SOLN
INTRAVENOUS | Status: DC
  Administered 2018-02-15 (×2): via INTRAVENOUS

## 2018-02-15 NOTE — Anesthesia Postprocedure Evaluation (Signed)
Anesthesia Post Note  Patient: CLEONA DOUBLEDAY  Procedure(s) Performed: CARDIOVERSION (N/A )     Patient location during evaluation: PACU Anesthesia Type: General Level of consciousness: awake and alert Pain management: pain level controlled Vital Signs Assessment: post-procedure vital signs reviewed and stable Respiratory status: spontaneous breathing, nonlabored ventilation, respiratory function stable and patient connected to nasal cannula oxygen Cardiovascular status: blood pressure returned to baseline and stable Postop Assessment: no apparent nausea or vomiting Anesthetic complications: no    Last Vitals:  Vitals:   02/15/18 1200 02/15/18 1210  BP: (!) 120/46 (!) 130/52  Pulse: (!) 44 (!) 43  Resp: 13 (!) 21  Temp:    SpO2: 100% 100%    Last Pain:  Vitals:   02/15/18 1210  TempSrc:   PainSc: 0-No pain                 Tyquavious Gamel

## 2018-02-15 NOTE — Anesthesia Procedure Notes (Signed)
Procedure Name: General with mask airway Date/Time: 02/15/2018 10:48 AM Performed by: Jenne Campus, CRNA Pre-anesthesia Checklist: Patient identified, Emergency Drugs available, Suction available, Patient being monitored and Timeout performed Patient Re-evaluated:Patient Re-evaluated prior to induction Oxygen Delivery Method: Ambu bag Preoxygenation: Pre-oxygenation with 100% oxygen Induction Type: IV induction

## 2018-02-15 NOTE — CV Procedure (Signed)
Electrical Cardioversion Procedure Note Courtney Pearson [id number] Jul 11, 1950  Procedure: Electrical Cardioversion Indications:  Atrial Fibrillation  Procedure Details Consent: Risks of procedure as well as the alternatives and risks of each were explained to the (patient/caregiver).  Consent for procedure obtained. Time Out: Verified patient identification, verified procedure, site/side was marked, verified correct patient position, special equipment/implants available, medications/allergies/relevent history reviewed, required imaging and test results available.  Performed  Patient placed on cardiac monitor, pulse oximetry, supplemental oxygen as necessary.  Sedation given: propofol Pacer pads placed anterior and posterior chest.  Cardioverted 1 time(s).  Cardioverted at [place].  Evaluation Findings: Post procedure EKG shows: sinus bradycardia with PACs Complications: bradycardia and hypotension requiring atropine and calcium chloride.  Patient did tolerate procedure well.   Courtney Latch, MD 02/15/2018, 11:06 AM

## 2018-02-15 NOTE — Discharge Instructions (Signed)
Electrical Cardioversion, Care After This sheet gives you information about how to care for yourself after your procedure. Your health care provider may also give you more specific instructions. If you have problems or questions, contact your health care provider. What can I expect after the procedure? After the procedure, it is common to have:  Some redness on the skin where the shocks were given. Follow these instructions at home:   Do not drive for 24 hours if you were given a medicine to help you relax (sedative).  Take over-the-counter and prescription medicines only as told by your health care provider.  Ask your health care provider how to check your pulse. Check it often.  Rest for 48 hours after the procedure or as told by your health care provider.  Avoid or limit your caffeine use as told by your health care provider. Contact a health care provider if:  You feel like your heart is beating too quickly or your pulse is not regular.  You have a serious muscle cramp that does not go away. Get help right away if:   You have discomfort in your chest.  You are dizzy or you feel faint.  You have trouble breathing or you are short of breath.  Your speech is slurred.  You have trouble moving an arm or leg on one side of your body.  Your fingers or toes turn cold or blue. This information is not intended to replace advice given to you by your health care provider. Make sure you discuss any questions you have with your health care provider. Document Released: 10/30/2012 Document Revised: 08/13/2015 Document Reviewed: 07/16/2015 Elsevier Interactive Patient Education  2019 [place] not take any metoprolol today, 02/15/18.  Tomorrow, resume metoprolol tartrate 12.5mg  twice daily.

## 2018-02-15 NOTE — Addendum Note (Signed)
Encounter addended by: Sherran Needs, NP on: 02/15/2018 10:02 AM  Actions taken: Clinical Note Signed

## 2018-02-15 NOTE — Interval H&P Note (Signed)
History and Physical Interval Note:  02/15/2018 10:45 AM  Courtney Pearson  has presented today for surgery, with the diagnosis of A-FIB  The various methods of treatment have been discussed with the patient and family. After consideration of risks, benefits and other options for treatment, the patient has consented to  Procedure(s): CARDIOVERSION (N/A) as a surgical intervention .  The patient's history has been reviewed, patient examined, no change in status, stable for surgery.  I have reviewed the patient's chart and labs.  Questions were answered to the patient's satisfaction.     [name] , MD

## 2018-02-15 NOTE — Anesthesia Preprocedure Evaluation (Addendum)
Anesthesia Evaluation  Patient identified by MRN, date of birth, ID band Patient awake    Reviewed: Allergy & Precautions, H&P , NPO status , Patient's Chart, lab work & pertinent test results, reviewed documented beta blocker date and time   Airway Mallampati: II  TM Distance: >3 FB Neck ROM: Full    Dental no notable dental hx. (+) Teeth Intact, Dental Advisory Given   Pulmonary neg pulmonary ROS,    Pulmonary exam normal breath sounds clear to auscultation       Cardiovascular Exercise Tolerance: Good hypertension, Pt. on medications and Pt. on home beta blockers + dysrhythmias Atrial Fibrillation  Rhythm:Irregular Rate:Normal     Neuro/Psych negative neurological ROS  negative psych ROS   GI/Hepatic negative GI ROS, Neg liver ROS,   Endo/Other  diabetes, Type 1, Insulin DependentHypothyroidism   Renal/GU Renal InsufficiencyRenal disease  negative genitourinary   Musculoskeletal   Abdominal   Peds  Hematology negative hematology ROS (+)   Anesthesia Other Findings   Reproductive/Obstetrics negative OB ROS                             Anesthesia Physical  Anesthesia Plan  ASA: III  Anesthesia Plan: General   Post-op Pain Management:    Induction: Intravenous  PONV Risk Score and Plan: 4 or greater and Ondansetron, Dexamethasone and Midazolam  Airway Management Planned: Simple Face Mask, Nasal Cannula and Mask  Additional Equipment:   Intra-op Plan:   Post-operative Plan:   Informed Consent: I have reviewed the patients History and Physical, chart, labs and discussed the procedure including the risks, benefits and alternatives for the proposed anesthesia with the patient or authorized representative who has indicated his/her understanding and acceptance.     Dental advisory given  Plan Discussed with: CRNA, Anesthesiologist and Surgeon  Anesthesia Plan Comments:          Anesthesia Quick Evaluation

## 2018-02-15 NOTE — Progress Notes (Addendum)
Pt in for pre-dccv labs and EKG.  To be reviewed by Roderic Palau, NP  Pt is for cardioversion this am. 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 CBC, bmet drawn.

## 2018-02-15 NOTE — Transfer of Care (Signed)
Immediate Anesthesia Transfer of Care Note  Patient: Courtney Pearson  Procedure(s) Performed: CARDIOVERSION (N/A )  Patient Location: Endoscopy Unit  Anesthesia Type:General  Level of Consciousness: awake, oriented and patient cooperative  Airway & Oxygen Therapy: Patient Spontanous Breathing and Patient connected to nasal cannula oxygen  Post-op Assessment: Report given to RN and Post -op Vital signs reviewed and unstable, Anesthesiologist notified  Post vital signs: Reviewed  Last Vitals:  Vitals Value Taken Time  BP 88/40 02/15/2018 11:06 AM  Temp    Pulse 45 02/15/2018 11:06 AM  Resp 14 02/15/2018 11:06 AM  SpO2 100 % 02/15/2018 11:06 AM    Last Pain:  Vitals:   02/15/18 1019  TempSrc: Oral  PainSc: 0-No pain         Complications: No apparent anesthesia complications

## 2018-02-17 ENCOUNTER — Encounter (HOSPITAL_COMMUNITY): Payer: Self-pay | Admitting: Cardiovascular Disease

## 2018-02-22 ENCOUNTER — Other Ambulatory Visit (HOSPITAL_COMMUNITY): Payer: Self-pay | Admitting: Nurse Practitioner

## 2018-02-25 ENCOUNTER — Other Ambulatory Visit (HOSPITAL_COMMUNITY): Payer: Self-pay | Admitting: *Deleted

## 2018-03-19 ENCOUNTER — Encounter (HOSPITAL_COMMUNITY): Payer: Self-pay

## 2018-03-19 ENCOUNTER — Emergency Department (HOSPITAL_BASED_OUTPATIENT_CLINIC_OR_DEPARTMENT_OTHER): Payer: Medicare Other

## 2018-03-19 ENCOUNTER — Other Ambulatory Visit (HOSPITAL_COMMUNITY): Payer: Self-pay | Admitting: Nurse Practitioner

## 2018-03-19 ENCOUNTER — Emergency Department (HOSPITAL_COMMUNITY)
Admission: EM | Admit: 2018-03-19 | Discharge: 2018-03-19 | Disposition: A | Discharge status: Home / Self Care | Payer: Medicare Other | Attending: Emergency Medicine | Admitting: Emergency Medicine

## 2018-03-19 ENCOUNTER — Other Ambulatory Visit: Payer: Self-pay

## 2018-03-19 DIAGNOSIS — L03115 Cellulitis of right lower limb: Secondary | ICD-10-CM | POA: Insufficient documentation

## 2018-03-19 DIAGNOSIS — E119 Type 2 diabetes mellitus without complications: Secondary | ICD-10-CM | POA: Diagnosis not present

## 2018-03-19 DIAGNOSIS — L538 Other specified erythematous conditions: Secondary | ICD-10-CM

## 2018-03-19 DIAGNOSIS — E039 Hypothyroidism, unspecified: Secondary | ICD-10-CM | POA: Insufficient documentation

## 2018-03-19 DIAGNOSIS — Z794 Long term (current) use of insulin: Secondary | ICD-10-CM | POA: Diagnosis not present

## 2018-03-19 DIAGNOSIS — Z79899 Other long term (current) drug therapy: Secondary | ICD-10-CM | POA: Insufficient documentation

## 2018-03-19 DIAGNOSIS — I10 Essential (primary) hypertension: Secondary | ICD-10-CM | POA: Diagnosis not present

## 2018-03-19 DIAGNOSIS — R2241 Localized swelling, mass and lump, right lower limb: Secondary | ICD-10-CM | POA: Diagnosis present

## 2018-03-19 LAB — BASIC METABOLIC PANEL
Anion gap: 4 — ABNORMAL LOW (ref 5–15)
BUN: 23 mg/dL (ref 8–23)
CO2: 28 mmol/L (ref 22–32)
Calcium: 9.8 mg/dL (ref 8.9–10.3)
Chloride: 109 mmol/L (ref 98–111)
Creatinine, Ser: 1.25 mg/dL — ABNORMAL HIGH (ref 0.44–1.00)
GFR calc Af Amer: 51 mL/min — ABNORMAL LOW (ref >60)
GFR calc non Af Amer: 44 mL/min — ABNORMAL LOW (ref >60)
Glucose, Bld: 110 mg/dL — ABNORMAL HIGH (ref 70–99)
Potassium: 4 mmol/L (ref 3.5–5.1)
Sodium: 141 mmol/L (ref 135–145)

## 2018-03-19 LAB — CBC
HCT: 34.8 % — ABNORMAL LOW (ref 36.0–46.0)
Hemoglobin: 10.6 g/dL — ABNORMAL LOW (ref 12.0–15.0)
MCH: 29.9 pg (ref 26.0–34.0)
MCHC: 30.5 g/dL (ref 30.0–36.0)
MCV: 98.3 fL (ref 80.0–100.0)
Platelets: 179 10*3/uL (ref 150–400)
RBC: 3.54 MIL/uL — ABNORMAL LOW (ref 3.87–5.11)
RDW: 13.8 % (ref 11.5–15.5)
WBC: 4.4 10*3/uL (ref 4.0–10.5)
nRBC: 0 % (ref 0.0–0.2)

## 2018-03-19 MED ORDER — DOXYCYCLINE HYCLATE 100 MG PO CAPS: 100.0000 mg | ORAL_CAPSULE | Freq: Two times a day (BID) | ORAL | 0 refills | Status: DC

## 2018-03-19 MED ORDER — DOXYCYCLINE HYCLATE 100 MG PO TABS
100.0000 mg | ORAL_TABLET | Freq: Once | ORAL | Status: AC
  Administered 2018-03-19: 100 mg via ORAL
  Filled 2018-03-19: qty 1

## 2018-03-19 NOTE — ED Triage Notes (Signed)
Pt sent here by PCP for evaluation of right leg redness and swelling for about a week. Leg warm to touch, pt states he legs are normally swollen and today her leg does not look more swollen than normal. Pt taking Eliquis for a fib, no hx of blood clots.

## 2018-03-19 NOTE — ED Provider Notes (Signed)
[facility] EMERGENCY DEPARTMENT Provider Note   CSN: [id number] Arrival date & time: 03/19/18  1131    History   Chief Complaint Chief Complaint  Patient presents with  . Leg Swelling    HPI Courtney Pearson is a 68 y.o. female.     68 yo F with a chief complaint of right leg erythema and pain.  This been going on for the past 3 days.  She called her family doctor and they sent here for evaluation for an acute DVT.  Over the past 3 months she has been recently diagnosed with atrial fibrillation and started on Eliquis.  Denies noncompliance with medication.  Denies chest pain or shortness of breath.  Denies fevers or chills.  Denies nausea or vomiting.  She does not feel that the leg is exceptionally swollen.  Has some chronic swelling and feels like she is at baseline.  The history is provided by the patient.  Illness  Severity:  Mild Onset quality:  Gradual Duration:  3 days Timing:  Constant Progression:  Worsening Chronicity:  New Associated symptoms: no chest pain, no congestion, no fever, no headaches, no myalgias, no nausea, no rhinorrhea, no shortness of breath, no vomiting and no wheezing     Past Medical History:  Diagnosis Date  . Diabetes ([place])   . HTN (hypertension)   . Hyperlipidemia   . Hypothyroidism   . Palpitation   . Paroxysmal atrial fibrillation ([place]) 04/03/2017  . Renal insufficiency     Patient Active Problem List   Diagnosis Date Noted  . Persistent atrial fibrillation   . Paroxysmal atrial fibrillation ([place]) 04/03/2017  . Dyspnea 10/30/2012  . Palpitations 10/30/2012  . Essential hypertension 10/30/2012  . Hyperlipidemia 10/30/2012    Past Surgical History:  Procedure Laterality Date  . ATRIAL FIBRILLATION ABLATION N/A 01/01/2018   Procedure: ATRIAL FIBRILLATION ABLATION;  Surgeon: Thompson Grayer, MD;  Location: [facility] CV LAB;  Service: Cardiovascular;  Laterality: N/A;  . BREAST BIOPSY    . CARDIOVERSION N/A  04/30/2017   Procedure: CARDIOVERSION;  Surgeon: [name], MD;  Location: [facility] ENDOSCOPY;  Service: Cardiovascular;  Laterality: N/A;  . CARDIOVERSION N/A 02/15/2018   Procedure: CARDIOVERSION;  Surgeon: [name], MD;  Location: [facility];  Service: Cardiovascular;  Laterality: N/A;  . CESAREAN SECTION    . EYE SURGERY       OB History   No obstetric history on file.      Home Medications    Prior to Admission medications   Medication Sig Start Date End Date Taking? Authorizing Provider  acetaminophen (TYLENOL) 500 MG tablet Take 500 mg by mouth daily as needed for moderate pain or headache.    Yes [provider]  atorvastatin (LIPITOR) 10 MG tablet Take 10 mg by mouth every other day.  09/20/12  Yes [provider]  bismuth subsalicylate (PEPTO BISMOL) 262 MG/15ML suspension Take 30 mLs by mouth every 6 (six) hours as needed for indigestion.   Yes [provider]  calcium carbonate (TUMS - DOSED IN MG ELEMENTAL CALCIUM) 500 MG chewable tablet Chew 1 tablet by mouth daily.    Yes [provider]  cholecalciferol (VITAMIN D) 1000 units tablet Take 1,000 mg by mouth daily.   Yes [provider]  ELIQUIS 5 MG TABS tablet TAKE 1 TABLET (5 MG TOTAL) BY MOUTH 2 (TWO) TIMES DAILY. Patient taking differently: Take 5 mg by mouth 2 (two) times daily.  02/22/18  Yes Roderic Palau  C, NP  fexofenadine (ALLEGRA) 180 MG tablet Take 180 mg by mouth daily as needed for allergies or rhinitis.   Yes [provider]  furosemide (LASIX) 20 MG tablet Take 20 mg by mouth daily as needed for fluid.    Yes [provider]  hydrocortisone cream 1 % Apply 1 application topically daily as needed for itching.   Yes [provider]  insulin aspart (NOVOLOG) 100 UNIT/ML injection Inject 25 Units into the skin daily. Via insulin pump   Yes [provider]  loperamide (IMODIUM A-D) 2 MG tablet Take 2-4 mg by mouth as needed  for diarrhea or loose stools.    Yes [provider]  losartan (COZAAR) 25 MG tablet Take 12.5 mg by mouth daily.    Yes [provider]  metoprolol tartrate (LOPRESSOR) 25 MG tablet Take 0.5 tablets (12.5 mg total) by mouth 2 (two) times daily. (BETA BLOCKER) Patient taking differently: Take 25 mg by mouth 2 (two) times daily. (BETA BLOCKER) 01/08/18  Yes Sherran Needs, NP  Multiple Vitamins-Minerals (MULTIVITAMIN ADULT PO) Take 1 tablet by mouth daily.   Yes [provider]  potassium chloride (K-DUR) 10 MEQ tablet Take 1 tablet (10 mEq total) by mouth daily. Patient taking differently: Take 10 mEq by mouth every other day.  04/17/17  Yes Bhagat, Bhavinkumar, PA  SYNTHROID 75 MCG tablet Take 75 mcg by mouth daily before breakfast.  08/05/12  Yes [provider]  doxycycline (VIBRAMYCIN) 100 MG capsule Take 1 capsule (100 mg total) by mouth 2 (two) times daily. 03/19/18   Deno Etienne, DO  pantoprazole (PROTONIX) 40 MG tablet Take 1 tablet (40 mg total) by mouth daily. Patient not taking: Reported on 03/19/2018 01/01/18 03/19/18  Thompson Grayer, MD    Family History Family History  Problem Relation Age of Onset  . Heart disease Other        No family history  . Hypertension Mother   . Osteoarthritis Mother   . Osteoporosis Mother   . Anemia Father   . Asthma Sister   . Osteoarthritis Sister   . OCD Sister   . Leukemia Brother   . Lupus Child     Social History Social History   Tobacco Use  . Smoking status: Never Smoker  . Smokeless tobacco: Never Used  Substance Use Topics  . Alcohol use: No  . Drug use: Not on file     Allergies   Patient has no known allergies.   Review of Systems Review of Systems  Constitutional: Negative for chills and fever.  HENT: Negative for congestion and rhinorrhea.   Eyes: Negative for redness and visual disturbance.  Respiratory: Negative for shortness of breath and wheezing.   Cardiovascular: Negative  for chest pain and palpitations.  Gastrointestinal: Negative for nausea and vomiting.  Genitourinary: Negative for dysuria and urgency.  Musculoskeletal: Negative for arthralgias and myalgias.  Skin: Positive for color change. Negative for pallor and wound.  Neurological: Negative for dizziness and headaches.     Physical Exam Updated Vital Signs BP (!) 177/56 (BP Location: Right Arm)   Pulse (!) 46   Temp 98.1 F (36.7 C) (Oral)   Resp 16   SpO2 98%   Physical Exam Vitals signs and nursing note reviewed.  Constitutional:      General: She is not in acute distress.    Appearance: She is well-developed. She is not diaphoretic.  HENT:     Head: Normocephalic and atraumatic.  Eyes:  Pupils: Pupils are equal, round, and reactive to light.  Neck:     Musculoskeletal: Normal range of motion and neck supple.  Cardiovascular:     Rate and Rhythm: Normal rate and regular rhythm.     Heart sounds: No murmur. No friction rub. No gallop.   Pulmonary:     Effort: Pulmonary effort is normal.     Breath sounds: No wheezing or rales.  Abdominal:     General: There is no distension.     Palpations: Abdomen is soft.     Tenderness: There is no abdominal tenderness.  Musculoskeletal:        General: No tenderness.     Right lower leg: Edema present.     Left lower leg: Edema present.     Comments: 2+ bilateral lower extremity edema.  Pitting.  Patient has a small amount of erythema to the right anterior shin.  She has chronic skin changes secondary to chronic edema.  The area is mildly warm to touch and tender.  No fluctuance.  Skin:    General: Skin is warm and dry.  Neurological:     Mental Status: She is alert and oriented to person, place, and time.  Psychiatric:        Behavior: Behavior normal.      ED Treatments / Results  Labs (all labs ordered are listed, but only abnormal results are displayed) Labs Reviewed  BASIC METABOLIC PANEL - Abnormal; Notable for the  following components:      Result Value   Glucose, Bld 110 (*)    Creatinine, Ser 1.25 (*)    GFR calc non Af Amer 44 (*)    GFR calc Af Amer 51 (*)    Anion gap 4 (*)    All other components within normal limits  CBC - Abnormal; Notable for the following components:   RBC 3.54 (*)    Hemoglobin 10.6 (*)    HCT 34.8 (*)    All other components within normal limits    EKG None  Radiology Vas [place] Lower Extremity Venous (dvt) ([place] 7a-7p)  Result Date: 03/19/2018  Lower Venous Study Indications: Erythema.  Performing Technologist: Maudry Mayhew MHA, RDMS, RVT, RDCS  Examination Guidelines: A complete evaluation includes B-mode imaging, spectral Doppler, color Doppler, and power Doppler as needed of all accessible portions of each vessel. Bilateral testing is considered an integral part of a complete examination. Limited examinations for reoccurring indications may be performed as noted.  Right Venous Findings: +---------+---------------+---------+-----------+----------+--------------+          CompressibilityPhasicitySpontaneityPropertiesSummary        +---------+---------------+---------+-----------+----------+--------------+ CFV      Full           No       Yes                  Pulstaile flow +---------+---------------+---------+-----------+----------+--------------+ SFJ      Full                                                        +---------+---------------+---------+-----------+----------+--------------+ FV Prox  Full                                                        +---------+---------------+---------+-----------+----------+--------------+  FV Mid   Full                                                        +---------+---------------+---------+-----------+----------+--------------+ FV DistalFull                                                        +---------+---------------+---------+-----------+----------+--------------+ PFV       Full                                                        +---------+---------------+---------+-----------+----------+--------------+ POP      Full           No       Yes                  Pulstaile flow +---------+---------------+---------+-----------+----------+--------------+ PTV      Full                                                        +---------+---------------+---------+-----------+----------+--------------+ PERO     Full                                                        +---------+---------------+---------+-----------+----------+--------------+  Left Venous Findings: +---+---------------+---------+-----------+----------+--------------+    CompressibilityPhasicitySpontaneityPropertiesSummary        +---+---------------+---------+-----------+----------+--------------+ CFVFull           No       Yes                  Pulstaile flow +---+---------------+---------+-----------+----------+--------------+    Summary: Right: There is no evidence of deep vein thrombosis in the lower extremity. No cystic structure found in the popliteal fossa. Pulstaile flow is suggestive of possible elevated right sided heart pressure. Left: No evidence of common femoral vein obstruction.  *See table(s) above for measurements and observations.    Preliminary     Procedures Procedures (including critical care time)  Medications Ordered in ED Medications  doxycycline (VIBRA-TABS) tablet 100 mg (has no administration in time range)     Initial Impression / Assessment and Plan / ED Course  I have reviewed the triage vital signs and the nursing notes.  Pertinent labs & imaging results that were available during my care of the patient were reviewed by me and considered in my medical decision making (see chart for details).  Clinical Course as of Mar 19 1532  Tue Mar 19, 2018  1238 Patient not brought back to [facility]; I did not see or evaluate this patient.    [BM]    Clinical Course User Index [BM] Deliah Boston, PA-C       68  yo F with a chief complaint of erythema and pain to the anterior aspect of her right lower extremity.  She was sent by her PCP for evaluation for DVT.  Study was negative.  Clinically she does not appear to have a DVT and this is more likely cellulitis with the erythema warmth and tenderness to palpation.  She denies any recent travel or injury to her leg.  Started on doxycycline.  Have her follow-up with her PCP in the office. 3:34 PM:  I have discussed the diagnosis/risks/treatment options with the patient and family and believe the pt to be eligible for discharge home to follow-up with PCP. We also discussed returning to the ED immediately if new or worsening sx occur. We discussed the sx which are most concerning (e.g., sudden worsening pain, fever, rapid spreading redness, systemic symptoms) that necessitate immediate return. Medications administered to the patient during their visit and any new prescriptions provided to the patient are listed below.  Medications given during this visit Medications  doxycycline (VIBRA-TABS) tablet 100 mg (has no administration in time range)     The patient appears reasonably screen and/or stabilized for discharge and I doubt any other medical condition or other [facility] requiring further screening, evaluation, or treatment in the ED at this time prior to discharge.    Final Clinical Impressions(s) / ED Diagnoses   Final diagnoses:  Cellulitis of right lower extremity    ED Discharge Orders         Ordered    doxycycline (VIBRAMYCIN) 100 MG capsule  2 times daily     03/19/18 [place], Jermal Dismuke, DO 03/19/18 1534

## 2018-03-19 NOTE — Progress Notes (Signed)
Right lower extremity venous duplex completed. Refer to "CV Proc" under chart review to view preliminary results.  03/19/2018 12:36 PM Maudry Mayhew, MHA, RVT, RDCS, RDMS

## 2018-03-19 NOTE — Discharge Instructions (Signed)
As we discussed return to the emergency department for rapid spreading redness fever or if you feel unwell.  Otherwise call your family doctor tomorrow and discuss your visit here and see when they want to see you in the office.

## 2018-03-22 DIAGNOSIS — D638 Anemia in other chronic diseases classified elsewhere: Secondary | ICD-10-CM | POA: Diagnosis not present

## 2018-03-22 DIAGNOSIS — I8310 Varicose veins of unspecified lower extremity with inflammation: Secondary | ICD-10-CM | POA: Diagnosis not present

## 2018-03-22 DIAGNOSIS — N183 Chronic kidney disease, stage 3 (moderate): Secondary | ICD-10-CM | POA: Diagnosis not present

## 2018-03-22 DIAGNOSIS — I10 Essential (primary) hypertension: Secondary | ICD-10-CM | POA: Diagnosis not present

## 2018-03-22 DIAGNOSIS — L0390 Cellulitis, unspecified: Secondary | ICD-10-CM | POA: Diagnosis not present

## 2018-04-01 ENCOUNTER — Encounter: Payer: Self-pay | Admitting: Internal Medicine

## 2018-04-01 ENCOUNTER — Ambulatory Visit (INDEPENDENT_AMBULATORY_CARE_PROVIDER_SITE_OTHER): Payer: Medicare Other | Admitting: Internal Medicine

## 2018-04-01 VITALS — BP 126/70 | HR 104 | Ht 68.0 in | Wt 182.4 lb

## 2018-04-01 DIAGNOSIS — I4819 Other persistent atrial fibrillation: Secondary | ICD-10-CM

## 2018-04-01 DIAGNOSIS — I10 Essential (primary) hypertension: Secondary | ICD-10-CM

## 2018-04-01 NOTE — H&P (View-Only) (Signed)
PCP: Jonathon Jordan, MD    Courtney Pearson is a 68 y.o. female who presents today for routine electrophysiology followup.  Since his recent afib ablation, the patient reports doing reasonably well.  she denies procedure related complications.  She had ERAF and was cardioverted in January.  She thinks that she returned to afib this past Saturday.  + palpitations but is tolerating well.  Today, she denies symptoms of  chest pain, shortness of breath,  lower extremity edema, dizziness, presyncope, or syncope.  The patient is otherwise without complaint today.   Past Medical History:  Diagnosis Date  . Diabetes ([place])   . HTN (hypertension)   . Hyperlipidemia   . Hypothyroidism   . Palpitation   . Paroxysmal atrial fibrillation ([place]) 04/03/2017  . Renal insufficiency    Past Surgical History:  Procedure Laterality Date  . ATRIAL FIBRILLATION ABLATION N/A 01/01/2018   Procedure: ATRIAL FIBRILLATION ABLATION;  Surgeon: Thompson Grayer, MD;  Location: [place] [facility];  Service: Cardiovascular;  Laterality: N/A;  . BREAST BIOPSY    . CARDIOVERSION N/A 04/30/2017   Procedure: CARDIOVERSION;  Surgeon: [name], MD;  Location: [facility] ENDOSCOPY;  Service: Cardiovascular;  Laterality: N/A;  . CARDIOVERSION N/A 02/15/2018   Procedure: CARDIOVERSION;  Surgeon: [name], MD;  Location: [place];  Service: Cardiovascular;  Laterality: N/A;  . CESAREAN SECTION    . EYE SURGERY      ROS- all systems are personally reviewed and negatives except as per HPI above  Current Outpatient Medications  Medication Sig Dispense Refill  . acetaminophen (TYLENOL) 500 MG tablet Take 500 mg by mouth daily as needed for moderate pain or headache.     Marland Kitchen atorvastatin (LIPITOR) 10 MG tablet Take 10 mg by mouth every other day.     . bismuth subsalicylate (PEPTO BISMOL) 262 MG/15ML suspension Take 30 mLs by mouth every 6 (six) hours as needed for indigestion.    . calcium carbonate (TUMS - DOSED IN MG  ELEMENTAL CALCIUM) 500 MG chewable tablet Chew 1 tablet by mouth daily.     . cholecalciferol (VITAMIN D) 1000 units tablet Take 1,000 mg by mouth daily.    Marland Kitchen ELIQUIS 5 MG TABS tablet TAKE 1 TABLET (5 MG TOTAL) BY MOUTH 2 (TWO) TIMES DAILY. (Patient taking differently: Take 5 mg by mouth 2 (two) times daily. ) 180 tablet 2  . fexofenadine (ALLEGRA) 180 MG tablet Take 180 mg by mouth daily as needed for allergies or rhinitis.    . furosemide (LASIX) 20 MG tablet Take 20 mg by mouth daily as needed for fluid.     . hydrocortisone cream 1 % Apply 1 application topically daily as needed for itching.    . insulin aspart (NOVOLOG) 100 UNIT/ML injection Inject 25 Units into the skin daily. Via insulin pump    . loperamide (IMODIUM A-D) 2 MG tablet Take 2-4 mg by mouth as needed for diarrhea or loose stools.     Marland Kitchen losartan (COZAAR) 25 MG tablet Take 12.5 mg by mouth daily.     . metoprolol tartrate (LOPRESSOR) 25 MG tablet TAKE 0.5 TABLETS (12.5 MG TOTAL) BY MOUTH 2 (TWO) TIMES DAILY. (BETA BLOCKER) 90 tablet 1  . Multiple Vitamins-Minerals (MULTIVITAMIN ADULT PO) Take 1 tablet by mouth daily.    . potassium chloride (K-DUR) 10 MEQ tablet Take 1 tablet (10 mEq total) by mouth daily. (Patient taking differently: Take 10 mEq by mouth every other day. ) 30 tablet 6  .  SYNTHROID 75 MCG tablet Take 75 mcg by mouth daily before breakfast.      No current facility-administered medications for this visit.     Physical Exam: Vitals:   04/01/18 1136  BP: 126/70  Pulse: (!) 104  SpO2: 99%  Weight: 182 lb 6.4 oz (82.7 kg)  Height: 5\' 8"  (1.727 m)    GEN- The patient is well appearing, alert and oriented x 3 today.   Head- normocephalic, atraumatic Eyes-  Sclera clear, conjunctiva pink Ears- hearing intact Oropharynx- clear Lungs- Clear to ausculation bilaterally, normal work of breathing Heart- irregular rate and rhythm, no murmurs, rubs or gallops, PMI not laterally displaced GI- soft, NT, ND, + BS  Extremities- no clubbing, cyanosis, or edema  EKG tracing ordered today is personally reviewed and shows afib, V rate 104 bpm, nonspecific ST/T changes  Assessment and Plan:  1. Persistent atrial fibrillation and atrial flutter Doing well s/p ablation She did have ERAF and was cardioverted 02/15/2018 Unfortunately, she is back in afib today.  I have discussed options of cardioversion, tikosyn, amiodarone, or repeat ablation.  She would prefer to try cardioversion.  Risks and benefits to each approach were discussed at length. chads2vasc score is 4 Continue eliquis Cardioversion at the next available time Ultimately, she will likely require AAD therapy  2. HTN Stable No change required today  3. Severe OSA Uses CPAP  Return to AF clinic in 4 weeks  Thompson Grayer MD, [facility] 04/01/2018 11:44 AM

## 2018-04-01 NOTE — Progress Notes (Signed)
PCP: Jonathon Jordan, MD    Courtney Pearson is a 68 y.o. female who presents today for routine electrophysiology followup.  Since his recent afib ablation, the patient reports doing reasonably well.  she denies procedure related complications.  She had ERAF and was cardioverted in January.  She thinks that she returned to afib this past Saturday.  + palpitations but is tolerating well.  Today, she denies symptoms of  chest pain, shortness of breath,  lower extremity edema, dizziness, presyncope, or syncope.  The patient is otherwise without complaint today.   Past Medical History:  Diagnosis Date  . Diabetes ([place])   . HTN (hypertension)   . Hyperlipidemia   . Hypothyroidism   . Palpitation   . Paroxysmal atrial fibrillation ([place]) 04/03/2017  . Renal insufficiency    Past Surgical History:  Procedure Laterality Date  . ATRIAL FIBRILLATION ABLATION N/A 01/01/2018   Procedure: ATRIAL FIBRILLATION ABLATION;  Surgeon: Thompson Grayer, MD;  Location: [place] [facility];  Service: Cardiovascular;  Laterality: N/A;  . BREAST BIOPSY    . CARDIOVERSION N/A 04/30/2017   Procedure: CARDIOVERSION;  Surgeon: [name], MD;  Location: [facility] ENDOSCOPY;  Service: Cardiovascular;  Laterality: N/A;  . CARDIOVERSION N/A 02/15/2018   Procedure: CARDIOVERSION;  Surgeon: [name], MD;  Location: [place];  Service: Cardiovascular;  Laterality: N/A;  . CESAREAN SECTION    . EYE SURGERY      ROS- all systems are personally reviewed and negatives except as per HPI above  Current Outpatient Medications  Medication Sig Dispense Refill  . acetaminophen (TYLENOL) 500 MG tablet Take 500 mg by mouth daily as needed for moderate pain or headache.     Marland Kitchen atorvastatin (LIPITOR) 10 MG tablet Take 10 mg by mouth every other day.     . bismuth subsalicylate (PEPTO BISMOL) 262 MG/15ML suspension Take 30 mLs by mouth every 6 (six) hours as needed for indigestion.    . calcium carbonate (TUMS - DOSED IN MG  ELEMENTAL CALCIUM) 500 MG chewable tablet Chew 1 tablet by mouth daily.     . cholecalciferol (VITAMIN D) 1000 units tablet Take 1,000 mg by mouth daily.    Marland Kitchen ELIQUIS 5 MG TABS tablet TAKE 1 TABLET (5 MG TOTAL) BY MOUTH 2 (TWO) TIMES DAILY. (Patient taking differently: Take 5 mg by mouth 2 (two) times daily. ) 180 tablet 2  . fexofenadine (ALLEGRA) 180 MG tablet Take 180 mg by mouth daily as needed for allergies or rhinitis.    . furosemide (LASIX) 20 MG tablet Take 20 mg by mouth daily as needed for fluid.     . hydrocortisone cream 1 % Apply 1 application topically daily as needed for itching.    . insulin aspart (NOVOLOG) 100 UNIT/ML injection Inject 25 Units into the skin daily. Via insulin pump    . loperamide (IMODIUM A-D) 2 MG tablet Take 2-4 mg by mouth as needed for diarrhea or loose stools.     Marland Kitchen losartan (COZAAR) 25 MG tablet Take 12.5 mg by mouth daily.     . metoprolol tartrate (LOPRESSOR) 25 MG tablet TAKE 0.5 TABLETS (12.5 MG TOTAL) BY MOUTH 2 (TWO) TIMES DAILY. (BETA BLOCKER) 90 tablet 1  . Multiple Vitamins-Minerals (MULTIVITAMIN ADULT PO) Take 1 tablet by mouth daily.    . potassium chloride (K-DUR) 10 MEQ tablet Take 1 tablet (10 mEq total) by mouth daily. (Patient taking differently: Take 10 mEq by mouth every other day. ) 30 tablet 6  .  SYNTHROID 75 MCG tablet Take 75 mcg by mouth daily before breakfast.      No current facility-administered medications for this visit.     Physical Exam: Vitals:   04/01/18 1136  BP: 126/70  Pulse: (!) 104  SpO2: 99%  Weight: 182 lb 6.4 oz (82.7 kg)  Height: 5\' 8"  (1.727 m)    GEN- The patient is well appearing, alert and oriented x 3 today.   Head- normocephalic, atraumatic Eyes-  Sclera clear, conjunctiva pink Ears- hearing intact Oropharynx- clear Lungs- Clear to ausculation bilaterally, normal work of breathing Heart- irregular rate and rhythm, no murmurs, rubs or gallops, PMI not laterally displaced GI- soft, NT, ND, +  BS Extremities- no clubbing, cyanosis, or edema  EKG tracing ordered today is personally reviewed and shows afib, V rate 104 bpm, nonspecific ST/T changes  Assessment and Plan:  1. Persistent atrial fibrillation and atrial flutter Doing well s/p ablation She did have ERAF and was cardioverted 02/15/2018 Unfortunately, she is back in afib today.  I have discussed options of cardioversion, tikosyn, amiodarone, or repeat ablation.  She would prefer to try cardioversion.  Risks and benefits to each approach were discussed at length. chads2vasc score is 4 Continue eliquis Cardioversion at the next available time Ultimately, she will likely require AAD therapy  2. HTN Stable No change required today  3. Severe OSA Uses CPAP  Return to AF clinic in 4 weeks  Thompson Grayer MD, [facility] 04/01/2018 11:44 AM

## 2018-04-01 NOTE — Patient Instructions (Signed)
Medication Instructions:  Your physician recommends that you continue on your current medications as directed. Please refer to the Current Medication list given to you today.  Labwork: None ordered.  Testing/Procedures: Your physician has recommended that you have a Cardioversion (DCCV). Electrical Cardioversion uses a jolt of electricity to your heart either through paddles or wired patches attached to your chest. This is a controlled, usually prescheduled, procedure. Defibrillation is done under light anesthesia in the hospital, and you usually go home the day of the procedure. This is done to get your heart back into a normal rhythm. You are not awake for the procedure. Please see the instruction sheet given to you today.  Follow-Up: Your physician recommends that you schedule a follow-up appointment in:   4 weeks after March 18th with the A Fib clinic  Any Other Special Instructions Will Be Listed Below (If Applicable).     If you need a refill on your cardiac medications before your next appointment, please call your pharmacy.

## 2018-04-05 ENCOUNTER — Ambulatory Visit: Payer: Medicare Other | Admitting: Internal Medicine

## 2018-04-10 ENCOUNTER — Ambulatory Visit (HOSPITAL_COMMUNITY): Payer: Medicare Other | Admitting: Certified Registered Nurse Anesthetist

## 2018-04-10 ENCOUNTER — Other Ambulatory Visit: Payer: Self-pay

## 2018-04-10 ENCOUNTER — Encounter (HOSPITAL_COMMUNITY): Payer: Self-pay | Admitting: *Deleted

## 2018-04-10 ENCOUNTER — Other Ambulatory Visit: Payer: Self-pay | Admitting: Cardiovascular Disease

## 2018-04-10 ENCOUNTER — Ambulatory Visit (HOSPITAL_COMMUNITY)
Admission: RE | Admit: 2018-04-10 | Discharge: 2018-04-10 | Disposition: A | Discharge status: Home / Self Care | Payer: Medicare Other | Attending: Cardiovascular Disease | Admitting: Cardiovascular Disease

## 2018-04-10 ENCOUNTER — Encounter (HOSPITAL_COMMUNITY)
Admission: RE | Disposition: A | Discharge status: Home / Self Care | Payer: Self-pay | Source: Home / Self Care | Attending: Cardiovascular Disease

## 2018-04-10 DIAGNOSIS — I10 Essential (primary) hypertension: Secondary | ICD-10-CM | POA: Diagnosis not present

## 2018-04-10 DIAGNOSIS — I4891 Unspecified atrial fibrillation: Secondary | ICD-10-CM

## 2018-04-10 DIAGNOSIS — E039 Hypothyroidism, unspecified: Secondary | ICD-10-CM | POA: Insufficient documentation

## 2018-04-10 DIAGNOSIS — E119 Type 2 diabetes mellitus without complications: Secondary | ICD-10-CM | POA: Diagnosis not present

## 2018-04-10 DIAGNOSIS — Z7901 Long term (current) use of anticoagulants: Secondary | ICD-10-CM | POA: Insufficient documentation

## 2018-04-10 DIAGNOSIS — R002 Palpitations: Secondary | ICD-10-CM | POA: Insufficient documentation

## 2018-04-10 DIAGNOSIS — E785 Hyperlipidemia, unspecified: Secondary | ICD-10-CM | POA: Diagnosis not present

## 2018-04-10 DIAGNOSIS — G4733 Obstructive sleep apnea (adult) (pediatric): Secondary | ICD-10-CM | POA: Insufficient documentation

## 2018-04-10 DIAGNOSIS — Z79899 Other long term (current) drug therapy: Secondary | ICD-10-CM | POA: Insufficient documentation

## 2018-04-10 DIAGNOSIS — I480 Paroxysmal atrial fibrillation: Secondary | ICD-10-CM | POA: Diagnosis not present

## 2018-04-10 DIAGNOSIS — I4892 Unspecified atrial flutter: Secondary | ICD-10-CM | POA: Insufficient documentation

## 2018-04-10 DIAGNOSIS — Z794 Long term (current) use of insulin: Secondary | ICD-10-CM | POA: Diagnosis not present

## 2018-04-10 DIAGNOSIS — I4819 Other persistent atrial fibrillation: Secondary | ICD-10-CM | POA: Diagnosis not present

## 2018-04-10 DIAGNOSIS — N289 Disorder of kidney and ureter, unspecified: Secondary | ICD-10-CM | POA: Diagnosis not present

## 2018-04-10 HISTORY — PX: CARDIOVERSION: SHX1299

## 2018-04-10 LAB — POCT I-STAT 4, (NA,K, GLUC, HGB,HCT)
Glucose, Bld: 145 mg/dL — ABNORMAL HIGH (ref 70–99)
HCT: 38 % (ref 36.0–46.0)
Hemoglobin: 12.9 g/dL (ref 12.0–15.0)
Potassium: 4.3 mmol/L (ref 3.5–5.1)
Sodium: 140 mmol/L (ref 135–145)

## 2018-04-10 SURGERY — CARDIOVERSION
Anesthesia: General

## 2018-04-10 MED ORDER — PROPOFOL 500 MG/50ML IV EMUL
INTRAVENOUS | Status: DC | PRN
  Administered 2018-04-10: 40 ug/kg/min via INTRAVENOUS

## 2018-04-10 MED ORDER — LIDOCAINE HCL (CARDIAC) PF 100 MG/5ML IV SOSY
PREFILLED_SYRINGE | INTRAVENOUS | Status: DC | PRN
  Administered 2018-04-10: 60 mg via INTRAVENOUS

## 2018-04-10 MED ORDER — METOPROLOL TARTRATE 25 MG PO TABS: 12.5000 mg | ORAL_TABLET | Freq: Every day | ORAL | 1 refills | Status: DC

## 2018-04-10 MED ORDER — SODIUM CHLORIDE 0.9 % IV SOLN
INTRAVENOUS | Status: DC | PRN
  Administered 2018-04-10: 12:00:00 via INTRAVENOUS

## 2018-04-10 NOTE — CV Procedure (Signed)
DCC: Anesthesia: Propofol Dr Therisa Doyne  [facility] x2 120 J and 150 J Converted from afib rate 104 to SB rate 46-54 bpm  No immediate neurologic sequelae On Rx eliquis with no missed doses  Jenkins Rouge

## 2018-04-10 NOTE — Transfer of Care (Signed)
Immediate Anesthesia Transfer of Care Note  Patient: Courtney Pearson  Procedure(s) Performed: CARDIOVERSION (N/A )  Patient Location: Endoscopy Unit  Anesthesia Type:General  Level of Consciousness: awake, alert , oriented and patient cooperative  Airway & Oxygen Therapy: Patient Spontanous Breathing and Patient connected to nasal cannula oxygen  Post-op Assessment: Report given to RN and Post -op Vital signs reviewed and stable  Post vital signs: Reviewed and stable  Last Vitals:  Vitals Value Taken Time  BP    Temp    Pulse    Resp    SpO2      Last Pain:  Vitals:   04/10/18 1205  TempSrc: Oral  PainSc: 0-No pain         Complications: No apparent anesthesia complications

## 2018-04-10 NOTE — Anesthesia Postprocedure Evaluation (Signed)
Anesthesia Post Note  Patient: Courtney Pearson  Procedure(s) Performed: CARDIOVERSION (N/A )     Patient location during evaluation: Endoscopy Anesthesia Type: General Level of consciousness: awake and alert Pain management: pain level controlled Vital Signs Assessment: post-procedure vital signs reviewed and stable Respiratory status: spontaneous breathing, nonlabored ventilation and respiratory function stable Cardiovascular status: blood pressure returned to baseline and stable Postop Assessment: no apparent nausea or vomiting Anesthetic complications: no    Last Vitals:  Vitals:   04/10/18 1300 04/10/18 1310  BP: (!) 114/46 (!) 132/48  Pulse: (!) 48 (!) 47  Resp: 12 12  Temp:    SpO2: 100% 100%    Last Pain:  Vitals:   04/10/18 1310  TempSrc:   PainSc: 0-No pain                 Shynia Daleo,W. EDMOND

## 2018-04-10 NOTE — Discharge Instructions (Signed)
Electrical Cardioversion, Care After °This sheet gives you information about how to care for yourself after your procedure. Your health care provider may also give you more specific instructions. If you have problems or questions, contact your health care provider. °What can I expect after the procedure? °After the procedure, it is common to have: °· Some redness on the skin where the shocks were given. °Follow these instructions at home: ° °· Do not drive for 24 hours if you were given a medicine to help you relax (sedative). °· Take over-the-counter and prescription medicines only as told by your health care provider. °· Ask your health care provider how to check your pulse. Check it often. °· Rest for 48 hours after the procedure or as told by your health care provider. °· Avoid or limit your caffeine use as told by your health care provider. °Contact a health care provider if: °· You feel like your heart is beating too quickly or your pulse is not regular. °· You have a serious muscle cramp that does not go away. °Get help right away if: ° °· You have discomfort in your chest. °· You are dizzy or you feel faint. °· You have trouble breathing or you are short of breath. °· Your speech is slurred. °· You have trouble moving an arm or leg on one side of your body. °· Your fingers or toes turn cold or blue. °This information is not intended to replace advice given to you by your health care provider. Make sure you discuss any questions you have with your health care provider. °Document Released: 10/30/2012 Document Revised: 08/13/2015 Document Reviewed: 07/16/2015 °Elsevier Interactive Patient Education © 2019 [employer] ° °

## 2018-04-10 NOTE — Anesthesia Preprocedure Evaluation (Addendum)
Anesthesia Evaluation  Patient identified by MRN, date of birth, ID band Patient awake    Reviewed: Allergy & Precautions, H&P , NPO status , Patient's Chart, lab work & pertinent test results, reviewed documented beta blocker date and time   Airway Mallampati: II  TM Distance: >3 FB Neck ROM: Full    Dental no notable dental hx. (+) Teeth Intact, Dental Advisory Given   Pulmonary neg pulmonary ROS,    Pulmonary exam normal breath sounds clear to auscultation       Cardiovascular hypertension, Pt. on medications and Pt. on home beta blockers + dysrhythmias Atrial Fibrillation  Rhythm:Irregular Rate:Normal     Neuro/Psych negative neurological ROS  negative psych ROS   GI/Hepatic negative GI ROS, Neg liver ROS,   Endo/Other  diabetes, Insulin DependentHypothyroidism   Renal/GU Renal InsufficiencyRenal disease  negative genitourinary   Musculoskeletal   Abdominal   Peds  Hematology negative hematology ROS (+)   Anesthesia Other Findings   Reproductive/Obstetrics negative OB ROS                            Anesthesia Physical Anesthesia Plan  ASA: III  Anesthesia Plan: General   Post-op Pain Management:    Induction: Intravenous  PONV Risk Score and Plan: 3 and Treatment may vary due to age or medical condition  Airway Management Planned: Mask  Additional Equipment:   Intra-op Plan:   Post-operative Plan:   Informed Consent: I have reviewed the patients History and Physical, chart, labs and discussed the procedure including the risks, benefits and alternatives for the proposed anesthesia with the patient or authorized representative who has indicated his/her understanding and acceptance.     Dental advisory given  Plan Discussed with: CRNA  Anesthesia Plan Comments:         Anesthesia Quick Evaluation

## 2018-04-10 NOTE — Interval H&P Note (Signed)
History and Physical Interval Note:  04/10/2018 12:05 PM  [place]  has presented today for surgery, with the diagnosis of ATRIAL FIBRILLATION.  The various methods of treatment have been discussed with the patient and family. After consideration of risks, benefits and other options for treatment, the patient has consented to  Procedure(s): CARDIOVERSION (N/A) as a surgical intervention.  The patient's history has been reviewed, patient examined, no change in status, stable for surgery.  I have reviewed the patient's chart and labs.  Questions were answered to the patient's satisfaction.     Jenkins Rouge

## 2018-04-26 ENCOUNTER — Encounter (HOSPITAL_COMMUNITY): Payer: Self-pay | Admitting: *Deleted

## 2018-05-07 ENCOUNTER — Ambulatory Visit (HOSPITAL_COMMUNITY)
Admission: RE | Admit: 2018-05-07 | Discharge: 2018-05-07 | Disposition: A | Discharge status: Home / Self Care | Payer: Medicare Other | Source: Ambulatory Visit | Attending: Nurse Practitioner | Admitting: Nurse Practitioner

## 2018-05-07 ENCOUNTER — Other Ambulatory Visit: Payer: Self-pay

## 2018-05-07 ENCOUNTER — Encounter (HOSPITAL_COMMUNITY): Payer: Self-pay | Admitting: Nurse Practitioner

## 2018-05-07 VITALS — BP 130/60 | HR 49 | Wt 186.0 lb

## 2018-05-07 DIAGNOSIS — I480 Paroxysmal atrial fibrillation: Secondary | ICD-10-CM | POA: Diagnosis not present

## 2018-05-07 NOTE — Progress Notes (Signed)
Electrophysiology TeleHealth Note   Due to national recommendations of social distancing due to COVID 19,  Video telehealth visit is felt to be most appropriate for this patient at this time.  See MyChart message/consent below from today for patient consent regarding telehealth for the [facility].    Date:  05/07/2018   ID:  Courtney Pearson, DOB [birth year], MRN [id number]  Location: home  Provider location: [place], [place] [address] Evaluation Performed: Bettina Gavia up afib ablation 01/01/18  PCP:  Jonathon Jordan, MD  Primary Cardiologist:  Dr. Oval Linsey Primary Electrophysiologist: Dr. Rayann Heman   CC: f/u afib ablation   History of Present Illness: Courtney Pearson is a 68 y.o. female who presents via audio conferencing for a telehealth visit today.   The patient is referred for f/u afib ablation. Pt had to be cardioverted about one month after ablation and again when she saw Dr. Rayann Heman at the 3 month f/u early March. He offered DCCV, Tikosyn, amiodarone or repeat ablation.  Today, she reports that she feels she is in rhythm for the most part but will see her HR spike to 120's at times, usually daily at rest usually. This will last around 30 mins. She tracks with her fitbit. She is not symptomatic with the HR in the 40's.   Today, she denies symptoms of palpitations, chest pain, shortness of breath, orthopnea, PND, lower extremity edema, claudication, dizziness, presyncope, syncope, bleeding, or neurologic sequela. The patient is tolerating medications without difficulties and is otherwise without complaint today.   she denies symptoms of cough, fevers, chills, or new SOB worrisome for COVID 19.     Atrial Fibrillation Risk Factors:  she does not have symptoms or diagnosis of sleep apnea.  therapy. she does not have a history of rheumatic fever. she does not have a history of alcohol use. The patient does not have a history of early familial atrial  fibrillation or other arrhythmias.  she has a BMI of There is no height or weight on file to calculate BMI.. There were no vitals filed for this visit.  Past Medical History:  Diagnosis Date  . Diabetes ([place])   . HTN (hypertension)   . Hyperlipidemia   . Hypothyroidism   . Palpitation   . Paroxysmal atrial fibrillation ([place]) 04/03/2017  . Renal insufficiency    Past Surgical History:  Procedure Laterality Date  . ATRIAL FIBRILLATION ABLATION N/A 01/01/2018   Procedure: ATRIAL FIBRILLATION ABLATION;  Surgeon: Thompson Grayer, MD;  Location: [facility];  Service: Cardiovascular;  Laterality: N/A;  . BREAST BIOPSY    . CARDIOVERSION N/A 04/30/2017   Procedure: CARDIOVERSION;  Surgeon: [name], MD;  Location: [facility] ENDOSCOPY;  Service: Cardiovascular;  Laterality: N/A;  . CARDIOVERSION N/A 02/15/2018   Procedure: CARDIOVERSION;  Surgeon: [name], MD;  Location: [facility] ENDOSCOPY;  Service: Cardiovascular;  Laterality: N/A;  . CARDIOVERSION N/A 04/10/2018   Procedure: CARDIOVERSION;  Surgeon: Josue Hector, MD;  Location: [facility];  Service: Cardiovascular;  Laterality: N/A;  . CESAREAN SECTION    . EYE SURGERY       Current Outpatient Medications  Medication Sig Dispense Refill  . acetaminophen (TYLENOL) 500 MG tablet Take 500 mg by mouth daily as needed for moderate pain or headache.     Marland Kitchen atorvastatin (LIPITOR) 10 MG tablet Take 10 mg by mouth every other day.     . bismuth subsalicylate (PEPTO BISMOL) 262 MG/15ML suspension Take 30 mLs by  mouth every 6 (six) hours as needed for indigestion.    . calcium carbonate (TUMS - DOSED IN MG ELEMENTAL CALCIUM) 500 MG chewable tablet Chew 1 tablet by mouth daily.     Marland Kitchen ELIQUIS 5 MG TABS tablet TAKE 1 TABLET (5 MG TOTAL) BY MOUTH 2 (TWO) TIMES DAILY. (Patient taking differently: Take 5 mg by mouth 2 (two) times daily. ) 180 tablet 2  . fexofenadine (ALLEGRA) 180 MG tablet Take 180 mg by mouth daily as needed for allergies  or rhinitis.    . furosemide (LASIX) 20 MG tablet Take 20 mg by mouth daily as needed for fluid.     . hydrocortisone cream 1 % Apply 1 application topically daily as needed for itching.    . insulin aspart (NOVOLOG) 100 UNIT/ML injection Inject 25 Units into the skin daily. Via insulin pump    . loperamide (IMODIUM A-D) 2 MG tablet Take 2-4 mg by mouth as needed for diarrhea or loose stools.     Marland Kitchen losartan (COZAAR) 25 MG tablet Take 12.5 mg by mouth daily.     . metoprolol tartrate (LOPRESSOR) 25 MG tablet Take 0.5 tablets (12.5 mg total) by mouth daily before breakfast. (BETA BLOCKER) 90 tablet 1  . Multiple Vitamins-Minerals (MULTIVITAMIN ADULT PO) Take 1 tablet by mouth daily.    . potassium chloride (K-DUR) 10 MEQ tablet Take 1 tablet (10 mEq total) by mouth daily. (Patient taking differently: Take 10 mEq by mouth every other day. ) 30 tablet 6  . SYNTHROID 75 MCG tablet Take 75 mcg by mouth daily before breakfast.      No current facility-administered medications for this encounter.     Allergies:   Patient has no known allergies.   Social History:  The patient  reports that she has never smoked. She has never used smokeless tobacco. She reports that she does not drink alcohol.   Family History:  The patient's  family history includes Anemia in her father; Asthma in her sister; Heart disease in an other family member; Hypertension in her mother; Leukemia in her brother; Lupus in her child; OCD in her sister; Osteoarthritis in her mother and sister; Osteoporosis in her mother.    ROS:  Please see the history of present illness.   All other systems are personally reviewed and negative.   Exam: Na, telephone visit  Recent Labs: 05/22/2017: ALT 32 03/19/2018: BUN 23; Creatinine, Ser 1.25; Platelets 179 04/10/2018: Hemoglobin 12.9; Potassium 4.3; Sodium 140  personally reviewed    Other studies personally reviewed: Epic records reviewed  The patient presents wearable device  technology, fitbit, report for my review today.     ASSESSMENT AND PLAN:  1. Paroxysmal atrial fibrillation Pt with ablation in December 2019 and two cardioversions since then It sounds as she is for the most part staying  in sinus brady but with spells of non sustained afib We discussed several other options to maintain SR but  for now with covid restrictions in place, she will continue to monitor. continue eliquis 5 mg bid    This patients CHA2DS2-VASc Score and unadjusted Ischemic Stroke Rate (% per year) is equal to 3.2 % stroke rate/year from a score of 3  Above score calculated as 1 point each if present [CHF, HTN, DM, Vascular=MI/PAD/Aortic Plaque, Age if 65-74, or Female] Above score calculated as 2 points each if present [Age > 75, or Stroke/TIA/TE]  COVID screen The patient does not have any symptoms that suggest any further testing/  screening at this time.  Social distancing reinforced today.  Follow-up:   One month  Current medicines are reviewed at length with the patient today.   The patient does not have concerns regarding her medicines.  The following changes were made today:    Labs/ tests ordered today include: {NONE   No orders of the defined types were placed in this encounter.   Patient Risk:  after full review of this patients clinical status, I feel that they are at  MODERATE risk at this time.   Today, I have spent 15 minutes with the patient with telehealth technology discussing  AFIB   Signed, Roderic Palau NP 05/07/2018 10:37 AM  Afib [facility] [address] [place], [place] [address] [phone]   I hereby voluntarily request, consent and authorize the [facility] and its employed or contracted physicians, physician assistants, nurse practitioners or other licensed health care professionals (the Practitioner), to provide me with telemedicine health care services (the "Services") as deemed necessary by the  treating Practitioner. I acknowledge and consent to receive the Services by the Practitioner via telemedicine. I understand that the telemedicine visit will involve communicating with the Practitioner through live audiovisual communication technology and the disclosure of certain medical information by electronic transmission. I acknowledge that I have been given the opportunity to request an in-person assessment or other available alternative prior to the telemedicine visit and am voluntarily participating in the telemedicine visit.   I understand that I have the right to withhold or withdraw my consent to the use of telemedicine in the course of my care at any time, without affecting my right to future care or treatment, and that the Practitioner or I may terminate the telemedicine visit at any time. I understand that I have the right to inspect all information obtained and/or recorded in the course of the telemedicine visit and may receive copies of available information for a reasonable fee.  I understand that some of the potential risks of receiving the Services via telemedicine include:   Delay or interruption in medical evaluation due to technological equipment failure or disruption;  Information transmitted may not be sufficient (e.g. poor resolution of images) to allow for appropriate medical decision making by the Practitioner; and/or  In rare instances, security protocols could fail, causing a breach of personal health information.   Furthermore, I acknowledge that it is my responsibility to provide information about my medical history, conditions and care that is complete and accurate to the best of my ability. I acknowledge that Practitioner's advice, recommendations, and/or decision may be based on factors not within their control, such as incomplete or inaccurate data provided by me or distortions of diagnostic images or specimens that may result from electronic transmissions. I understand that  the practice of medicine is not an exact science and that Practitioner makes no warranties or guarantees regarding treatment outcomes. I acknowledge that I will receive a copy of this consent concurrently upon execution via email to the email address I last provided but may also request a printed copy by calling the office of the [facility].  I understand that my insurance will be billed for this visit.   I have read or had this consent read to me.  I understand the contents of this consent, which adequately explains the benefits and risks of the Services being provided via telemedicine.  I have been provided ample opportunity to ask questions regarding this consent and the Services and  have had my questions answered to my satisfaction.  I give my informed consent for the services to be provided through the use of telemedicine in my medical care  By participating in this telemedicine visit I agree to the above.

## 2018-05-14 ENCOUNTER — Telehealth: Payer: Self-pay

## 2018-05-14 NOTE — Telephone Encounter (Signed)
Called pt to see if she would like to be seen tomorrow. Pt states would like to push the follow up out for a later date. Offered video/telephone

## 2018-06-21 DIAGNOSIS — K121 Other forms of stomatitis: Secondary | ICD-10-CM | POA: Diagnosis not present

## 2018-06-26 ENCOUNTER — Other Ambulatory Visit: Payer: Self-pay | Admitting: Physician Assistant

## 2018-06-29 DIAGNOSIS — H6980 Other specified disorders of Eustachian tube, unspecified ear: Secondary | ICD-10-CM | POA: Diagnosis not present

## 2018-06-29 DIAGNOSIS — J029 Acute pharyngitis, unspecified: Secondary | ICD-10-CM | POA: Diagnosis not present

## 2018-07-04 DIAGNOSIS — J039 Acute tonsillitis, unspecified: Secondary | ICD-10-CM | POA: Diagnosis not present

## 2018-07-09 ENCOUNTER — Encounter (INDEPENDENT_AMBULATORY_CARE_PROVIDER_SITE_OTHER): Payer: Medicare Other | Admitting: Ophthalmology

## 2018-07-11 DIAGNOSIS — J358 Other chronic diseases of tonsils and adenoids: Secondary | ICD-10-CM | POA: Diagnosis not present

## 2018-07-11 DIAGNOSIS — R070 Pain in throat: Secondary | ICD-10-CM | POA: Diagnosis not present

## 2018-08-13 DIAGNOSIS — R070 Pain in throat: Secondary | ICD-10-CM | POA: Diagnosis not present

## 2018-08-13 DIAGNOSIS — J358 Other chronic diseases of tonsils and adenoids: Secondary | ICD-10-CM | POA: Diagnosis not present

## 2018-08-16 DIAGNOSIS — N183 Chronic kidney disease, stage 3 (moderate): Secondary | ICD-10-CM | POA: Diagnosis not present

## 2018-08-16 DIAGNOSIS — Z794 Long term (current) use of insulin: Secondary | ICD-10-CM | POA: Diagnosis not present

## 2018-08-16 DIAGNOSIS — E039 Hypothyroidism, unspecified: Secondary | ICD-10-CM | POA: Diagnosis not present

## 2018-08-16 DIAGNOSIS — E1022 Type 1 diabetes mellitus with diabetic chronic kidney disease: Secondary | ICD-10-CM | POA: Diagnosis not present

## 2018-08-16 DIAGNOSIS — E11319 Type 2 diabetes mellitus with unspecified diabetic retinopathy without macular edema: Secondary | ICD-10-CM | POA: Diagnosis not present

## 2018-08-16 DIAGNOSIS — R748 Abnormal levels of other serum enzymes: Secondary | ICD-10-CM | POA: Diagnosis not present

## 2018-08-16 DIAGNOSIS — E1165 Type 2 diabetes mellitus with hyperglycemia: Secondary | ICD-10-CM | POA: Diagnosis not present

## 2018-08-16 DIAGNOSIS — E1042 Type 1 diabetes mellitus with diabetic polyneuropathy: Secondary | ICD-10-CM | POA: Diagnosis not present

## 2018-08-27 ENCOUNTER — Other Ambulatory Visit: Payer: Self-pay | Admitting: Internal Medicine

## 2018-08-27 DIAGNOSIS — R748 Abnormal levels of other serum enzymes: Secondary | ICD-10-CM

## 2018-09-03 ENCOUNTER — Ambulatory Visit
Admission: RE | Admit: 2018-09-03 | Discharge: 2018-09-03 | Disposition: A | Discharge status: Home / Self Care | Payer: Medicare Other | Source: Ambulatory Visit | Attending: Internal Medicine | Admitting: Internal Medicine

## 2018-09-03 DIAGNOSIS — R748 Abnormal levels of other serum enzymes: Secondary | ICD-10-CM

## 2018-09-03 DIAGNOSIS — K8020 Calculus of gallbladder without cholecystitis without obstruction: Secondary | ICD-10-CM | POA: Diagnosis not present

## 2018-09-27 ENCOUNTER — Encounter (HOSPITAL_COMMUNITY): Payer: Self-pay | Admitting: Nurse Practitioner

## 2018-09-27 ENCOUNTER — Other Ambulatory Visit: Payer: Self-pay

## 2018-09-27 ENCOUNTER — Ambulatory Visit (HOSPITAL_COMMUNITY)
Admission: RE | Admit: 2018-09-27 | Discharge: 2018-09-27 | Disposition: A | Discharge status: Home / Self Care | Payer: Medicare Other | Source: Ambulatory Visit | Attending: Nurse Practitioner | Admitting: Nurse Practitioner

## 2018-09-27 VITALS — BP 130/76 | HR 117 | Ht 68.0 in | Wt 195.0 lb

## 2018-09-27 DIAGNOSIS — E039 Hypothyroidism, unspecified: Secondary | ICD-10-CM | POA: Diagnosis not present

## 2018-09-27 DIAGNOSIS — Z794 Long term (current) use of insulin: Secondary | ICD-10-CM | POA: Diagnosis not present

## 2018-09-27 DIAGNOSIS — E785 Hyperlipidemia, unspecified: Secondary | ICD-10-CM | POA: Insufficient documentation

## 2018-09-27 DIAGNOSIS — I10 Essential (primary) hypertension: Secondary | ICD-10-CM | POA: Diagnosis not present

## 2018-09-27 DIAGNOSIS — Z8249 Family history of ischemic heart disease and other diseases of the circulatory system: Secondary | ICD-10-CM | POA: Insufficient documentation

## 2018-09-27 DIAGNOSIS — Z8262 Family history of osteoporosis: Secondary | ICD-10-CM | POA: Diagnosis not present

## 2018-09-27 DIAGNOSIS — I480 Paroxysmal atrial fibrillation: Secondary | ICD-10-CM

## 2018-09-27 DIAGNOSIS — Z832 Family history of diseases of the blood and blood-forming organs and certain disorders involving the immune mechanism: Secondary | ICD-10-CM | POA: Insufficient documentation

## 2018-09-27 DIAGNOSIS — Z7901 Long term (current) use of anticoagulants: Secondary | ICD-10-CM | POA: Insufficient documentation

## 2018-09-27 DIAGNOSIS — Z8261 Family history of arthritis: Secondary | ICD-10-CM | POA: Insufficient documentation

## 2018-09-27 DIAGNOSIS — I4891 Unspecified atrial fibrillation: Secondary | ICD-10-CM | POA: Diagnosis present

## 2018-09-27 DIAGNOSIS — Z825 Family history of asthma and other chronic lower respiratory diseases: Secondary | ICD-10-CM | POA: Diagnosis not present

## 2018-09-27 DIAGNOSIS — Z79890 Hormone replacement therapy (postmenopausal): Secondary | ICD-10-CM | POA: Diagnosis not present

## 2018-09-27 DIAGNOSIS — E119 Type 2 diabetes mellitus without complications: Secondary | ICD-10-CM | POA: Diagnosis not present

## 2018-09-27 DIAGNOSIS — Z79899 Other long term (current) drug therapy: Secondary | ICD-10-CM | POA: Insufficient documentation

## 2018-09-27 DIAGNOSIS — N289 Disorder of kidney and ureter, unspecified: Secondary | ICD-10-CM | POA: Diagnosis not present

## 2018-09-27 NOTE — Progress Notes (Addendum)
Electrophysiology  Note   Date:  09/27/2018   ID:  Courtney Pearson, DOB 04-08-1950, MRN [id number]   Provider location: [address] [place], [place] [address] Evaluation Performed: Follow up afib    PCP:  Jonathon Jordan, MD  Primary Cardiologist:  Dr. Oval Linsey Primary Electrophysiologist: Dr. Rayann Heman   CC: f/u afib    History of Present Illness: Courtney Pearson is a 68 y.o. female who presents  in the afib clinic for persistent afib "for a while." She has noted increased exertional dyspnea.  Afib ablation 12/2017.Marland Kitchen Pt had to be cardioverted about one month after ablation and again when she saw Dr. Rayann Heman at the 3 month f/u early March. He offered DCCV, Tikosyn, amiodarone or repeat ablation.  She had reduced her BB to 1/2 tab a day as when she is in SR, she will have HR's in the 40's. She did miss one dose of eliquis last Thursday pm. Discussed options with her, amiodarone least favorite. Wants to discuss repeat ablation with Dr. Rayann Heman.   Today, she denies symptoms of palpitations, chest pain, + for shortness of breath with exertion, no orthopnea, PND, lower extremity edema, claudication, dizziness, presyncope, syncope, bleeding, or neurologic sequela. The patient is tolerating medications without difficulties and is otherwise without complaint today.   she denies symptoms of cough, fevers, chills, or new SOB worrisome for COVID 19.     Atrial Fibrillation Risk Factors:  she does not have symptoms or diagnosis of sleep apnea.  therapy. she does not have a history of rheumatic fever. she does not have a history of alcohol use. The patient does not have a history of early familial atrial fibrillation or other arrhythmias.  she has a BMI of Body mass index is 29.65 kg/m.Marland Kitchen Filed Weights   09/27/18 1117  Weight: 88.5 kg    Past Medical History:  Diagnosis Date  . Diabetes ([place])   . HTN (hypertension)   . Hyperlipidemia   . Hypothyroidism   . Palpitation   .  Paroxysmal atrial fibrillation ([place]) 04/03/2017  . Renal insufficiency    Past Surgical History:  Procedure Laterality Date  . ATRIAL FIBRILLATION ABLATION N/A 01/01/2018   Procedure: ATRIAL FIBRILLATION ABLATION;  Surgeon: Thompson Grayer, MD;  Location: [facility];  Service: Cardiovascular;  Laterality: N/A;  . BREAST BIOPSY    . CARDIOVERSION N/A 04/30/2017   Procedure: CARDIOVERSION;  Surgeon: [name], MD;  Location[facility] ENDOSCOPY;  Service: Cardiovascular;  Laterality: N/A;  . CARDIOVERSION N/A 02/15/2018   Procedure: CARDIOVERSION;  Surgeon: [name], MD;  Location: [facility] ENDOSCOPY;  Service: Cardiovascular;  Laterality: N/A;  . CARDIOVERSION N/A 04/10/2018   Procedure: CARDIOVERSION;  Surgeon: Josue Hector, MD;  Location: [facility];  Service: Cardiovascular;  Laterality: N/A;  . CESAREAN SECTION    . EYE SURGERY       Current Outpatient Medications  Medication Sig Dispense Refill  . acetaminophen (TYLENOL) 500 MG tablet Take 500 mg by mouth daily as needed for moderate pain or headache.     Marland Kitchen atorvastatin (LIPITOR) 10 MG tablet Take 10 mg by mouth every other day.     Marland Kitchen ELIQUIS 5 MG TABS tablet TAKE 1 TABLET (5 MG TOTAL) BY MOUTH 2 (TWO) TIMES DAILY. (Patient taking differently: Take 5 mg by mouth 2 (two) times daily. ) 180 tablet 2  . fexofenadine (ALLEGRA) 180 MG tablet Take 180 mg by mouth daily as needed for allergies or rhinitis.    Marland Kitchen  furosemide (LASIX) 20 MG tablet Take 20 mg by mouth daily as needed for fluid.     Marland Kitchen insulin aspart (NOVOLOG) 100 UNIT/ML injection Inject 25 Units into the skin daily. Via insulin pump    . losartan (COZAAR) 25 MG tablet Take 12.5 mg by mouth daily.     . metoprolol tartrate (LOPRESSOR) 25 MG tablet Take 0.5 tablets (12.5 mg total) by mouth daily before breakfast. (BETA BLOCKER) 90 tablet 1  . Multiple Vitamins-Minerals (MULTIVITAMIN ADULT PO) Take 1 tablet by mouth daily.    . potassium chloride (K-DUR) 10 MEQ tablet  TAKE 1 TABLET BY MOUTH EVERY DAY 90 tablet 1  . SYNTHROID 75 MCG tablet Take 75 mcg by mouth daily before breakfast.     . bismuth subsalicylate (PEPTO BISMOL) 262 MG/15ML suspension Take 30 mLs by mouth every 6 (six) hours as needed for indigestion.    . calcium carbonate (TUMS - DOSED IN MG ELEMENTAL CALCIUM) 500 MG chewable tablet Chew 1 tablet by mouth daily.     . hydrocortisone cream 1 % Apply 1 application topically daily as needed for itching.    . loperamide (IMODIUM A-D) 2 MG tablet Take 2-4 mg by mouth as needed for diarrhea or loose stools.      No current facility-administered medications for this encounter.     Allergies:   Patient has no known allergies.   Social History:  The patient  reports that she has never smoked. She has never used smokeless tobacco. She reports that she does not drink alcohol.   Family History:  The patient's  family history includes Anemia in her father; Asthma in her sister; Heart disease in an other family member; Hypertension in her mother; Leukemia in her brother; Lupus in her child; OCD in her sister; Osteoarthritis in her mother and sister; Osteoporosis in her mother.    ROS:  Please see the history of present illness.   All other systems are personally reviewed and negative.   Exam: GEN- The patient is well appearing, alert and oriented x 3 today.   Head- normocephalic, atraumatic Eyes-  Sclera clear, conjunctiva pink Ears- hearing intact Oropharynx- clear Lungs- Clear to ausculation bilaterally, normal work of breathing Heart- irregular rate and rhythm, no murmurs, rubs or gallops, PMI not laterally displaced GI- soft, NT, ND, + BS Extremities- no clubbing, cyanosis, or edema   Recent Labs: 03/19/2018: BUN 23; Creatinine, Ser 1.25; Platelets 179 04/10/2018: Hemoglobin 12.9; Potassium 4.3; Sodium 140  personally reviewed    Other studies personally reviewed: Epic records reviewed  EKG-  EKG tracing ordered today  shows atrial  flutter at 117 bpm   ASSESSMENT AND PLAN:  1. Paroxysmal atrial fibrillation Pt with ablation in December 2019 and two cardioversions since then Now back in afib for "some time" We discussed options She would like to start with Dr. Rayann Heman discussing repeat ablation Continue eliquis 5 mg bid, missed one dose last Thursday, going forward, restart date is 8/28 for uninterrupted anticoagulation For RVR, she will increase metoprolol tartrate to 12.5 mg bid( has only been taking in am)   This patients CHA2DS2-VASc Score is at least 4   Above score calculated as 1 point each if present [CHF, HTN, DM, Vascular=MI/PAD/Aortic Plaque, Age if 65-74, or Female] Above score calculated as 2 points each if present [Age > 75, or Stroke/TIA/TE]  COVID screen The patient does not have any symptoms that suggest any further testing/ screening at this time.  Social distancing reinforced today.  Follow-up:   Dr. Rayann Heman for next available virtual visit   Labs/ tests ordered today include: {NONE   Orders Placed This Encounter  Procedures  . EKG 12-Lead    Patient Risk:  after full review of this patients clinical status, I feel that they are at  MODERATE risk at this time.    Signed, Roderic Palau NP 09/27/2018 11:52 AM  Afib [facility] [address] [place], [place] [address] [phone]

## 2018-09-27 NOTE — Addendum Note (Signed)
Encounter addended by: Sherran Needs, NP on: 09/27/2018 12:18 PM  Actions taken: Clinical Note Signed

## 2018-10-01 ENCOUNTER — Telehealth: Payer: Self-pay

## 2018-10-01 NOTE — Telephone Encounter (Signed)
Spoke with pt regarding appt on 10/02/18. Pt was advised to check her vitals prior to her appt. Pt stated she did not have any questions at this time.

## 2018-10-02 ENCOUNTER — Telehealth (INDEPENDENT_AMBULATORY_CARE_PROVIDER_SITE_OTHER): Payer: Medicare Other | Admitting: Internal Medicine

## 2018-10-02 ENCOUNTER — Encounter: Payer: Self-pay | Admitting: Internal Medicine

## 2018-10-02 ENCOUNTER — Telehealth: Payer: Self-pay

## 2018-10-02 VITALS — BP 131/82 | HR 82 | Ht 68.0 in | Wt 195.0 lb

## 2018-10-02 DIAGNOSIS — I10 Essential (primary) hypertension: Secondary | ICD-10-CM

## 2018-10-02 DIAGNOSIS — I4819 Other persistent atrial fibrillation: Secondary | ICD-10-CM

## 2018-10-02 DIAGNOSIS — G4733 Obstructive sleep apnea (adult) (pediatric): Secondary | ICD-10-CM

## 2018-10-02 NOTE — Telephone Encounter (Signed)
Spoke with Pt regarding ablation.  Gave dates available in October.  Pt will advise nurse what day works for her after she discusses with her husband.  Pt will respond via MyChart.

## 2018-10-02 NOTE — H&P (View-Only) (Signed)
Electrophysiology TeleHealth Note   Due to national recommendations of social distancing due to COVID 19, an audio/video telehealth visit is felt to be most appropriate for this patient at this time.  See MyChart message from today for the patient's consent to telehealth for Courtney Pearson.   Date:  10/02/2018   ID:  Courtney Pearson, DOB [birth year], MRN [id number]  Location: patient's home  Provider location:  [facility]  Evaluation Performed: Follow-up visit  PCP:  Jonathon Jordan, MD   Electrophysiologist:  Dr Rayann Heman  Chief Complaint:  palpitations  History of Present Illness:    Courtney Pearson is a 68 y.o. female who presents via telehealth conferencing today.  Since last being seen in our clinic, the patient reports doing reasonably well. She continues to have episodes of atrial fibrillation.  She has required 3 prior cardioversions since her ablation 12/19.  She is unaware of triggers for her afib.  She reports fatigue and decreased exercise tolerance with episodes.  She is SOB with very little activity. She is using her Kardia device and finds that she is frequently in afib. Today, she denies symptoms of palpitations, chest pain, shortness of breath,  lower extremity edema, dizziness, presyncope, or syncope.  The patient is otherwise without complaint today.  The patient denies symptoms of fevers, chills, cough, or new SOB worrisome for COVID 19.  Past Medical History:  Diagnosis Date  . Diabetes ([place])   . HTN (hypertension)   . Hyperlipidemia   . Hypothyroidism   . Palpitation   . Persistent atrial fibrillation 04/03/2017  . Renal insufficiency     Past Surgical History:  Procedure Laterality Date  . ATRIAL FIBRILLATION ABLATION N/A 01/01/2018   Procedure: ATRIAL FIBRILLATION ABLATION;  Surgeon: Thompson Grayer, MD;  Location: [place] [facility];  Service: Cardiovascular;  Laterality: N/A;  . BREAST BIOPSY    . CARDIOVERSION N/A 04/30/2017   Procedure:  CARDIOVERSION;  Surgeon: [name], MD;  Location: [facility] ENDOSCOPY;  Service: Cardiovascular;  Laterality: N/A;  . CARDIOVERSION N/A 02/15/2018   Procedure: CARDIOVERSION;  Surgeon: [name], MD;  Location: [facility] ENDOSCOPY;  Service: Cardiovascular;  Laterality: N/A;  . CARDIOVERSION N/A 04/10/2018   Procedure: CARDIOVERSION;  Surgeon: Josue Hector, MD;  Location: [place];  Service: Cardiovascular;  Laterality: N/A;  . CESAREAN SECTION    . EYE SURGERY      Current Outpatient Medications  Medication Sig Dispense Refill  . acetaminophen (TYLENOL) 500 MG tablet Take 500 mg by mouth daily as needed for moderate pain or headache.     Marland Kitchen atorvastatin (LIPITOR) 10 MG tablet Take 10 mg by mouth every other day.     . bismuth subsalicylate (PEPTO BISMOL) 262 MG/15ML suspension Take 30 mLs by mouth every 6 (six) hours as needed for indigestion.    . calcium carbonate (TUMS - DOSED IN MG ELEMENTAL CALCIUM) 500 MG chewable tablet Chew 1 tablet by mouth daily.     Marland Kitchen ELIQUIS 5 MG TABS tablet TAKE 1 TABLET (5 MG TOTAL) BY MOUTH 2 (TWO) TIMES DAILY. (Patient taking differently: Take 5 mg by mouth 2 (two) times daily. ) 180 tablet 2  . fexofenadine (ALLEGRA) 180 MG tablet Take 180 mg by mouth daily as needed for allergies or rhinitis.    . furosemide (LASIX) 20 MG tablet Take 20 mg by mouth daily as needed for fluid.     . hydrocortisone cream 1 % Apply 1 application topically daily as needed for itching.    Marland Kitchen  insulin aspart (NOVOLOG) 100 UNIT/ML injection Inject 25 Units into the skin daily. Via insulin pump    . loperamide (IMODIUM A-D) 2 MG tablet Take 2-4 mg by mouth as needed for diarrhea or loose stools.     Marland Kitchen losartan (COZAAR) 25 MG tablet Take 12.5 mg by mouth daily.     . metoprolol tartrate (LOPRESSOR) 25 MG tablet Take 0.5 tablets (12.5 mg total) by mouth daily before breakfast. (BETA BLOCKER) (Patient taking differently: Take 12.5 mg by mouth 2 (two) times daily. (BETA BLOCKER)) 90  tablet 1  . Multiple Vitamins-Minerals (MULTIVITAMIN ADULT PO) Take 1 tablet by mouth daily.    . potassium chloride (K-DUR) 10 MEQ tablet TAKE 1 TABLET BY MOUTH EVERY DAY 90 tablet 1  . SYNTHROID 75 MCG tablet Take 75 mcg by mouth daily before breakfast.      No current facility-administered medications for this visit.     Allergies:   Patient has no known allergies.   Social History:  The patient  reports that she has never smoked. She has never used smokeless tobacco. She reports that she does not drink alcohol.   Family History:  The patient's family history includes Anemia in her father; Asthma in her sister; Heart disease in an other family member; Hypertension in her mother; Leukemia in her brother; Lupus in her child; OCD in her sister; Osteoarthritis in her mother and sister; Osteoporosis in her mother.   ROS:  Please see the history of present illness.   All other systems are personally reviewed and negative.    Exam:    Vital Signs:  BP 131/82   Pulse 82   Ht 5\' 8"  (1.727 m)   Wt 195 lb (88.5 kg)   BMI 29.65 kg/m   Well sounding and appearing, alert and conversant, regular work of breathing,  good skin color Eyes- anicteric, neuro- grossly intact, skin- no apparent rash or lesions or cyanosis, mouth- oral mucosa is pink  Labs/Other Tests and Data Reviewed:    Recent Labs: 03/19/2018: BUN 23; Creatinine, Ser 1.25; Platelets 179 04/10/2018: Hemoglobin 12.9; Potassium 4.3; Sodium 140   Wt Readings from Last 3 Encounters:  10/02/18 195 lb (88.5 kg)  09/27/18 195 lb (88.5 kg)  05/07/18 186 lb (84.4 kg)     ASSESSMENT & PLAN:    1.  afib The patient has symptomatic, recurrent persistent atrial fibrillation. she has failed medical therapy with flecainide.  She underwent ablation 12/19 but continues to have afib.  She is in afib more persistently. Chads2vasc score is 4.  she is anticoagulated with eliquis . Therapeutic strategies for afib including medicine and  ablation were discussed in detail with the patient today.  She is not interested in [place] or amiodarone.  Risk, benefits, and alternatives to repeat EP study and radiofrequency ablation for afib were also discussed in detail today. These risks include but are not limited to stroke, bleeding, vascular damage, tamponade, perforation, damage to the esophagus, lungs, and other structures, pulmonary vein stenosis, worsening renal function, and death. The patient understands these risk and wishes to proceed.  We will therefore proceed with catheter ablation at the next available time.  Carto, ICE, anesthesia are requested for the procedure.  Will also obtain cardiac CT prior to the procedure to exclude LAA thrombus and further evaluate atrial anatomy.  2. HTN Stable No change required today  3. Severe OSA She uses CPAP  Patient Risk:  after full review of this patients clinical status, I feel  that they are at high risk at this time.  Today, I have spent 25 minutes with the patient with telehealth technology discussing arrhythmia management .    Army Fossa, MD  10/02/2018 11:36 AM     [facility] [address] [place] [address] [phone] (office) [phone] (fax)

## 2018-10-02 NOTE — Progress Notes (Signed)
Electrophysiology TeleHealth Note   Due to national recommendations of social distancing due to COVID 19, an audio/video telehealth visit is felt to be most appropriate for this patient at this time.  See MyChart message from today for the patient's consent to telehealth for [facility]   Date:  10/02/2018   ID:  Courtney Pearson, DOB Sep 27, 1950, MRN [id number]  Location: patient's home  Provider location:  [facility]  Evaluation Performed: Follow-up visit  PCP:  Jonathon Jordan, MD   Electrophysiologist:  Dr Rayann Heman  Chief Complaint:  palpitations  History of Present Illness:    Courtney Pearson is a 68 y.o. female who presents via telehealth conferencing today.  Since last being seen in our clinic, the patient reports doing reasonably well. She continues to have episodes of atrial fibrillation.  She has required 3 prior cardioversions since her ablation 12/19.  She is unaware of triggers for her afib.  She reports fatigue and decreased exercise tolerance with episodes.  She is SOB with very little activity. She is using her Kardia device and finds that she is frequently in afib. Today, she denies symptoms of palpitations, chest pain, shortness of breath,  lower extremity edema, dizziness, presyncope, or syncope.  The patient is otherwise without complaint today.  The patient denies symptoms of fevers, chills, cough, or new SOB worrisome for COVID 19.  Past Medical History:  Diagnosis Date  . Diabetes ([place])   . HTN (hypertension)   . Hyperlipidemia   . Hypothyroidism   . Palpitation   . Persistent atrial fibrillation 04/03/2017  . Renal insufficiency     Past Surgical History:  Procedure Laterality Date  . ATRIAL FIBRILLATION ABLATION N/A 01/01/2018   Procedure: ATRIAL FIBRILLATION ABLATION;  Surgeon: Thompson Grayer, MD;  Location: [place] [facility];  Service: Cardiovascular;  Laterality: N/A;  . BREAST BIOPSY    . CARDIOVERSION N/A 04/30/2017   Procedure:  CARDIOVERSION;  Surgeon: [name], MD;  Location: [facility] ENDOSCOPY;  Service: Cardiovascular;  Laterality: N/A;  . CARDIOVERSION N/A 02/15/2018   Procedure: CARDIOVERSION;  Surgeon: [name], MD;  Location: [facility] ENDOSCOPY;  Service: Cardiovascular;  Laterality: N/A;  . CARDIOVERSION N/A 04/10/2018   Procedure: CARDIOVERSION;  Surgeon: Josue Hector, MD;  Location: [place];  Service: Cardiovascular;  Laterality: N/A;  . CESAREAN SECTION    . EYE SURGERY      Current Outpatient Medications  Medication Sig Dispense Refill  . acetaminophen (TYLENOL) 500 MG tablet Take 500 mg by mouth daily as needed for moderate pain or headache.     Marland Kitchen atorvastatin (LIPITOR) 10 MG tablet Take 10 mg by mouth every other day.     . bismuth subsalicylate (PEPTO BISMOL) 262 MG/15ML suspension Take 30 mLs by mouth every 6 (six) hours as needed for indigestion.    . calcium carbonate (TUMS - DOSED IN MG ELEMENTAL CALCIUM) 500 MG chewable tablet Chew 1 tablet by mouth daily.     Marland Kitchen ELIQUIS 5 MG TABS tablet TAKE 1 TABLET (5 MG TOTAL) BY MOUTH 2 (TWO) TIMES DAILY. (Patient taking differently: Take 5 mg by mouth 2 (two) times daily. ) 180 tablet 2  . fexofenadine (ALLEGRA) 180 MG tablet Take 180 mg by mouth daily as needed for allergies or rhinitis.    . furosemide (LASIX) 20 MG tablet Take 20 mg by mouth daily as needed for fluid.     . hydrocortisone cream 1 % Apply 1 application topically daily as needed for itching.    Marland Kitchen  insulin aspart (NOVOLOG) 100 UNIT/ML injection Inject 25 Units into the skin daily. Via insulin pump    . loperamide (IMODIUM A-D) 2 MG tablet Take 2-4 mg by mouth as needed for diarrhea or loose stools.     Marland Kitchen losartan (COZAAR) 25 MG tablet Take 12.5 mg by mouth daily.     . metoprolol tartrate (LOPRESSOR) 25 MG tablet Take 0.5 tablets (12.5 mg total) by mouth daily before breakfast. (BETA BLOCKER) (Patient taking differently: Take 12.5 mg by mouth 2 (two) times daily. (BETA BLOCKER)) 90  tablet 1  . Multiple Vitamins-Minerals (MULTIVITAMIN ADULT PO) Take 1 tablet by mouth daily.    . potassium chloride (K-DUR) 10 MEQ tablet TAKE 1 TABLET BY MOUTH EVERY DAY 90 tablet 1  . SYNTHROID 75 MCG tablet Take 75 mcg by mouth daily before breakfast.      No current facility-administered medications for this visit.     Allergies:   Patient has no known allergies.   Social History:  The patient  reports that she has never smoked. She has never used smokeless tobacco. She reports that she does not drink alcohol.   Family History:  The patient's family history includes Anemia in her father; Asthma in her sister; Heart disease in an other family member; Hypertension in her mother; Leukemia in her brother; Lupus in her child; OCD in her sister; Osteoarthritis in her mother and sister; Osteoporosis in her mother.   ROS:  Please see the history of present illness.   All other systems are personally reviewed and negative.    Exam:    Vital Signs:  BP 131/82   Pulse 82   Ht 5\' 8"  (1.727 m)   Wt 195 lb (88.5 kg)   BMI 29.65 kg/m   Well sounding and appearing, alert and conversant, regular work of breathing,  good skin color Eyes- anicteric, neuro- grossly intact, skin- no apparent rash or lesions or cyanosis, mouth- oral mucosa is pink  Labs/Other Tests and Data Reviewed:    Recent Labs: 03/19/2018: BUN 23; Creatinine, Ser 1.25; Platelets 179 04/10/2018: Hemoglobin 12.9; Potassium 4.3; Sodium 140   Wt Readings from Last 3 Encounters:  10/02/18 195 lb (88.5 kg)  09/27/18 195 lb (88.5 kg)  05/07/18 186 lb (84.4 kg)     ASSESSMENT & PLAN:    1.  afib The patient has symptomatic, recurrent persistent atrial fibrillation. she has failed medical therapy with flecainide.  She underwent ablation 12/19 but continues to have afib.  She is in afib more persistently. Chads2vasc score is 4.  she is anticoagulated with eliquis . Therapeutic strategies for afib including medicine and  ablation were discussed in detail with the patient today.  She is not interested in [place] or amiodarone.  Risk, benefits, and alternatives to repeat EP study and radiofrequency ablation for afib were also discussed in detail today. These risks include but are not limited to stroke, bleeding, vascular damage, tamponade, perforation, damage to the esophagus, lungs, and other structures, pulmonary vein stenosis, worsening renal function, and death. The patient understands these risk and wishes to proceed.  We will therefore proceed with catheter ablation at the next available time.  Carto, ICE, anesthesia are requested for the procedure.  Will also obtain cardiac CT prior to the procedure to exclude LAA thrombus and further evaluate atrial anatomy.  2. HTN Stable No change required today  3. Severe OSA She uses CPAP  Patient Risk:  after full review of this patients clinical status, I feel  that they are at high risk at this time.  Today, I have spent 25 minutes with the patient with telehealth technology discussing arrhythmia management .    Army Fossa, MD  10/02/2018 11:36 AM     [facility] [address] [place] [address] [phone] (office) [phone] (fax)

## 2018-10-02 NOTE — Telephone Encounter (Signed)
-----   Message from Thompson Grayer, MD sent at 10/02/2018 11:39 AM EDT ----- Afib ablation Carto, ICE, anesthesia  Cardiac CT

## 2018-10-14 MED ORDER — METOPROLOL TARTRATE 100 MG PO TABS: 100.0000 mg | ORAL_TABLET | Freq: Once | ORAL | 0 refills | Status: DC

## 2018-10-14 NOTE — Telephone Encounter (Signed)
Message received to schedule afib ablation for October 8  Work up completed

## 2018-10-14 NOTE — Telephone Encounter (Signed)
New Message    Patient states she can do ablation 10/31/18 if that dates not available then she can do 11/07/18.  Please call patient to discuss.  She couldn't reply to the my chart message states it was too old.

## 2018-10-16 DIAGNOSIS — E039 Hypothyroidism, unspecified: Secondary | ICD-10-CM | POA: Diagnosis not present

## 2018-10-24 ENCOUNTER — Telehealth (HOSPITAL_COMMUNITY): Payer: Self-pay | Admitting: Emergency Medicine

## 2018-10-24 NOTE — Telephone Encounter (Signed)
Reaching out to patient to offer assistance regarding upcoming cardiac imaging study; pt verbalizes understanding of appt date/time, parking situation and where to check in, pre-test NPO status and medications ordered, and verified current allergies; name and call back number provided for further questions should they arise Marchia Bond RN [facility] [phone] office [phone] cell   Explained reason why she needs extra IV hydration pre-/post CTA, patient verbalized understanding.   appt made for med day at 11a, orders in signed and held

## 2018-10-25 ENCOUNTER — Ambulatory Visit (HOSPITAL_COMMUNITY)
Admission: RE | Admit: 2018-10-25 | Discharge: 2018-10-25 | Disposition: A | Discharge status: Home / Self Care | Payer: Medicare Other | Source: Ambulatory Visit | Attending: Cardiovascular Disease | Admitting: Cardiovascular Disease

## 2018-10-25 ENCOUNTER — Other Ambulatory Visit: Payer: Self-pay

## 2018-10-25 ENCOUNTER — Ambulatory Visit (HOSPITAL_COMMUNITY)
Admission: RE | Admit: 2018-10-25 | Discharge: 2018-10-25 | Disposition: A | Discharge status: Home / Self Care | Payer: Medicare Other | Source: Ambulatory Visit | Attending: Internal Medicine | Admitting: Internal Medicine

## 2018-10-25 DIAGNOSIS — I4819 Other persistent atrial fibrillation: Secondary | ICD-10-CM | POA: Diagnosis not present

## 2018-10-25 LAB — BASIC METABOLIC PANEL
Anion gap: 8 (ref 5–15)
BUN: 24 mg/dL — ABNORMAL HIGH (ref 8–23)
CO2: 24 mmol/L (ref 22–32)
Calcium: 9.2 mg/dL (ref 8.9–10.3)
Chloride: 105 mmol/L (ref 98–111)
Creatinine, Ser: 1.27 mg/dL — ABNORMAL HIGH (ref 0.44–1.00)
GFR calc Af Amer: 50 mL/min — ABNORMAL LOW (ref >60)
GFR calc non Af Amer: 43 mL/min — ABNORMAL LOW (ref >60)
Glucose, Bld: 327 mg/dL — ABNORMAL HIGH (ref 70–99)
Potassium: 4.4 mmol/L (ref 3.5–5.1)
Sodium: 137 mmol/L (ref 135–145)

## 2018-10-25 MED ORDER — SODIUM CHLORIDE 0.9 % WEIGHT BASED INFUSION: 1.0000 mL/kg/h | INTRAVENOUS | Status: DC

## 2018-10-25 MED ORDER — IOHEXOL 350 MG/ML SOLN
80.0000 mL | Freq: Once | INTRAVENOUS | Status: AC | PRN
  Administered 2018-10-25: 80 mL via INTRAVENOUS

## 2018-10-25 MED ORDER — SODIUM CHLORIDE 0.9 % WEIGHT BASED INFUSION
3.0000 mL/kg/h | INTRAVENOUS | Status: AC
  Administered 2018-10-25: 3 mL/kg/h via INTRAVENOUS

## 2018-10-28 ENCOUNTER — Other Ambulatory Visit: Payer: Self-pay

## 2018-10-28 ENCOUNTER — Other Ambulatory Visit: Payer: Medicare Other | Admitting: *Deleted

## 2018-10-28 ENCOUNTER — Other Ambulatory Visit (HOSPITAL_COMMUNITY)
Admission: RE | Admit: 2018-10-28 | Discharge: 2018-10-28 | Disposition: A | Discharge status: Home / Self Care | Payer: Medicare Other | Source: Ambulatory Visit | Attending: Internal Medicine | Admitting: Internal Medicine

## 2018-10-28 DIAGNOSIS — Z01812 Encounter for preprocedural laboratory examination: Secondary | ICD-10-CM | POA: Diagnosis not present

## 2018-10-28 DIAGNOSIS — I4891 Unspecified atrial fibrillation: Secondary | ICD-10-CM | POA: Insufficient documentation

## 2018-10-28 DIAGNOSIS — Z20828 Contact with and (suspected) exposure to other viral communicable diseases: Secondary | ICD-10-CM | POA: Insufficient documentation

## 2018-10-28 DIAGNOSIS — I4819 Other persistent atrial fibrillation: Secondary | ICD-10-CM | POA: Diagnosis not present

## 2018-10-28 LAB — BASIC METABOLIC PANEL
BUN/Creatinine Ratio: 21 (ref 12–28)
BUN: 26 mg/dL (ref 8–27)
CO2: 24 mmol/L (ref 20–29)
Calcium: 9.6 mg/dL (ref 8.7–10.3)
Chloride: 103 mmol/L (ref 96–106)
Creatinine, Ser: 1.26 mg/dL — ABNORMAL HIGH (ref 0.57–1.00)
GFR calc Af Amer: 50 mL/min/{1.73_m2} — ABNORMAL LOW (ref >59)
GFR calc non Af Amer: 44 mL/min/{1.73_m2} — ABNORMAL LOW (ref >59)
Glucose: 295 mg/dL — ABNORMAL HIGH (ref 65–99)
Potassium: 4.5 mmol/L (ref 3.5–5.2)
Sodium: 140 mmol/L (ref 134–144)

## 2018-10-28 LAB — CBC WITH DIFFERENTIAL/PLATELET
Basophils Absolute: 0 10*3/uL (ref 0.0–0.2)
Basos: 1 %
EOS (ABSOLUTE): 0.2 10*3/uL (ref 0.0–0.4)
Eos: 3 %
Hematocrit: 35.2 % (ref 34.0–46.6)
Hemoglobin: 11.6 g/dL (ref 11.1–15.9)
Immature Grans (Abs): 0 10*3/uL (ref 0.0–0.1)
Immature Granulocytes: 0 %
Lymphocytes Absolute: 1 10*3/uL (ref 0.7–3.1)
Lymphs: 18 %
MCH: 30.6 pg (ref 26.6–33.0)
MCHC: 33 g/dL (ref 31.5–35.7)
MCV: 93 fL (ref 79–97)
Monocytes Absolute: 0.3 10*3/uL (ref 0.1–0.9)
Monocytes: 6 %
Neutrophils Absolute: 3.7 10*3/uL (ref 1.4–7.0)
Neutrophils: 72 %
Platelets: 167 10*3/uL (ref 150–450)
RBC: 3.79 x10E6/uL (ref 3.77–5.28)
RDW: 12.2 % (ref 11.7–15.4)
WBC: 5.2 10*3/uL (ref 3.4–10.8)

## 2018-10-29 LAB — NOVEL CORONAVIRUS, NAA (HOSP ORDER, SEND-OUT TO REF LAB; TAT 18-24 HRS): SARS-CoV-2, NAA: NOT DETECTED

## 2018-10-31 ENCOUNTER — Ambulatory Visit (HOSPITAL_COMMUNITY): Payer: Medicare Other | Admitting: Anesthesiology

## 2018-10-31 ENCOUNTER — Ambulatory Visit (HOSPITAL_COMMUNITY)
Admission: RE | Admit: 2018-10-31 | Discharge: 2018-10-31 | Disposition: A | Discharge status: Home / Self Care | Payer: Medicare Other | Attending: Internal Medicine | Admitting: Internal Medicine

## 2018-10-31 ENCOUNTER — Encounter (HOSPITAL_COMMUNITY)
Admission: RE | Disposition: A | Discharge status: Home / Self Care | Payer: Self-pay | Source: Home / Self Care | Attending: Internal Medicine

## 2018-10-31 ENCOUNTER — Other Ambulatory Visit: Payer: Self-pay

## 2018-10-31 ENCOUNTER — Encounter (HOSPITAL_COMMUNITY): Payer: Self-pay | Admitting: Anesthesiology

## 2018-10-31 DIAGNOSIS — Z79899 Other long term (current) drug therapy: Secondary | ICD-10-CM | POA: Insufficient documentation

## 2018-10-31 DIAGNOSIS — Z794 Long term (current) use of insulin: Secondary | ICD-10-CM | POA: Diagnosis not present

## 2018-10-31 DIAGNOSIS — E119 Type 2 diabetes mellitus without complications: Secondary | ICD-10-CM | POA: Insufficient documentation

## 2018-10-31 DIAGNOSIS — I480 Paroxysmal atrial fibrillation: Secondary | ICD-10-CM | POA: Diagnosis not present

## 2018-10-31 DIAGNOSIS — G4733 Obstructive sleep apnea (adult) (pediatric): Secondary | ICD-10-CM | POA: Diagnosis not present

## 2018-10-31 DIAGNOSIS — E039 Hypothyroidism, unspecified: Secondary | ICD-10-CM | POA: Insufficient documentation

## 2018-10-31 DIAGNOSIS — I4819 Other persistent atrial fibrillation: Secondary | ICD-10-CM | POA: Insufficient documentation

## 2018-10-31 DIAGNOSIS — E785 Hyperlipidemia, unspecified: Secondary | ICD-10-CM | POA: Insufficient documentation

## 2018-10-31 DIAGNOSIS — Z7901 Long term (current) use of anticoagulants: Secondary | ICD-10-CM | POA: Insufficient documentation

## 2018-10-31 DIAGNOSIS — Z79890 Hormone replacement therapy (postmenopausal): Secondary | ICD-10-CM | POA: Insufficient documentation

## 2018-10-31 DIAGNOSIS — I10 Essential (primary) hypertension: Secondary | ICD-10-CM | POA: Diagnosis not present

## 2018-10-31 DIAGNOSIS — Z8249 Family history of ischemic heart disease and other diseases of the circulatory system: Secondary | ICD-10-CM | POA: Diagnosis not present

## 2018-10-31 HISTORY — PX: ATRIAL FIBRILLATION ABLATION: EP1191

## 2018-10-31 LAB — GLUCOSE, CAPILLARY: Glucose-Capillary: 162 mg/dL — ABNORMAL HIGH (ref 70–99)

## 2018-10-31 SURGERY — ATRIAL FIBRILLATION ABLATION
Anesthesia: General

## 2018-10-31 MED ORDER — SODIUM CHLORIDE 0.9% FLUSH: 3.0000 mL | INTRAVENOUS | Status: DC | PRN

## 2018-10-31 MED ORDER — APIXABAN 5 MG PO TABS: 5.0000 mg | ORAL_TABLET | Freq: Once | ORAL | Status: DC

## 2018-10-31 MED ORDER — FENTANYL CITRATE (PF) 100 MCG/2ML IJ SOLN: 25.0000 ug | INTRAMUSCULAR | Status: DC | PRN

## 2018-10-31 MED ORDER — PHENYLEPHRINE 40 MCG/ML (10ML) SYRINGE FOR IV PUSH (FOR BLOOD PRESSURE SUPPORT)
PREFILLED_SYRINGE | INTRAVENOUS | Status: DC | PRN
  Administered 2018-10-31: 80 ug via INTRAVENOUS
  Administered 2018-10-31 (×2): 40 ug via INTRAVENOUS

## 2018-10-31 MED ORDER — BUPIVACAINE HCL (PF) 0.25 % IJ SOLN
INTRAMUSCULAR | Status: AC
  Filled 2018-10-31: qty 30

## 2018-10-31 MED ORDER — ACETAMINOPHEN 325 MG PO TABS: 650.0000 mg | ORAL_TABLET | ORAL | Status: DC | PRN

## 2018-10-31 MED ORDER — MIDAZOLAM HCL 5 MG/5ML IJ SOLN
INTRAMUSCULAR | Status: DC | PRN
  Administered 2018-10-31: 2 mg via INTRAVENOUS

## 2018-10-31 MED ORDER — HEPARIN SODIUM (PORCINE) 1000 UNIT/ML IJ SOLN
INTRAMUSCULAR | Status: AC
  Filled 2018-10-31: qty 2

## 2018-10-31 MED ORDER — HEPARIN (PORCINE) IN NACL 1000-0.9 UT/500ML-% IV SOLN
INTRAVENOUS | Status: DC | PRN
  Administered 2018-10-31: 500 mL

## 2018-10-31 MED ORDER — ROCURONIUM BROMIDE 10 MG/ML (PF) SYRINGE
PREFILLED_SYRINGE | INTRAVENOUS | Status: DC | PRN
  Administered 2018-10-31: 80 mg via INTRAVENOUS

## 2018-10-31 MED ORDER — BUPIVACAINE HCL (PF) 0.25 % IJ SOLN
INTRAMUSCULAR | Status: DC | PRN
  Administered 2018-10-31: 10 mL
  Administered 2018-10-31: 20 mL

## 2018-10-31 MED ORDER — ONDANSETRON HCL 4 MG/2ML IJ SOLN: 4.0000 mg | Freq: Four times a day (QID) | INTRAMUSCULAR | Status: DC | PRN

## 2018-10-31 MED ORDER — SODIUM CHLORIDE 0.9% FLUSH: 3.0000 mL | Freq: Two times a day (BID) | INTRAVENOUS | Status: DC

## 2018-10-31 MED ORDER — HEPARIN SODIUM (PORCINE) 1000 UNIT/ML IJ SOLN
INTRAMUSCULAR | Status: DC | PRN
  Administered 2018-10-31: 1000 [IU] via INTRAVENOUS

## 2018-10-31 MED ORDER — FENTANYL CITRATE (PF) 100 MCG/2ML IJ SOLN
INTRAMUSCULAR | Status: DC | PRN
  Administered 2018-10-31: 50 ug via INTRAVENOUS
  Administered 2018-10-31 (×2): 25 ug via INTRAVENOUS

## 2018-10-31 MED ORDER — SUGAMMADEX SODIUM 200 MG/2ML IV SOLN
INTRAVENOUS | Status: DC | PRN
  Administered 2018-10-31: 200 mg via INTRAVENOUS

## 2018-10-31 MED ORDER — SODIUM CHLORIDE 0.9 % IV SOLN: 250.0000 mL | INTRAVENOUS | Status: DC | PRN

## 2018-10-31 MED ORDER — ONDANSETRON HCL 4 MG/2ML IJ SOLN
INTRAMUSCULAR | Status: DC | PRN
  Administered 2018-10-31: 4 mg via INTRAVENOUS

## 2018-10-31 MED ORDER — HYDROCODONE-ACETAMINOPHEN 5-325 MG PO TABS: 1.0000 | ORAL_TABLET | ORAL | Status: DC | PRN

## 2018-10-31 MED ORDER — SODIUM CHLORIDE 0.9 % IV SOLN
INTRAVENOUS | Status: DC | PRN
  Administered 2018-10-31: 10 ug/min via INTRAVENOUS

## 2018-10-31 MED ORDER — PROPOFOL 10 MG/ML IV BOLUS
INTRAVENOUS | Status: DC | PRN
  Administered 2018-10-31: 120 mg via INTRAVENOUS

## 2018-10-31 MED ORDER — SODIUM CHLORIDE 0.9 % IV SOLN
INTRAVENOUS | Status: DC
  Administered 2018-10-31: 06:00:00 via INTRAVENOUS

## 2018-10-31 MED ORDER — LIDOCAINE 2% (20 MG/ML) 5 ML SYRINGE
INTRAMUSCULAR | Status: DC | PRN
  Administered 2018-10-31: 40 mg via INTRAVENOUS

## 2018-10-31 SURGICAL SUPPLY — 20 items
BLANKET WARM UNDERBOD FULL ACC (MISCELLANEOUS) ×2 IMPLANT
CATH MAPPNG PENTARAY F 2-6-2MM (CATHETERS) IMPLANT
CATH NAVISTAR SMARTTOUCH DF (ABLATOR) ×1 IMPLANT
CATH SOUNDSTAR ECO REPROCESSED (CATHETERS) ×1 IMPLANT
CATH WEBSTER BI DIR CS D-F CRV (CATHETERS) ×1 IMPLANT
COVER SWIFTLINK CONNECTOR (BAG) ×2 IMPLANT
DEVICE CLOSURE PERCLS PRGLD 6F (VASCULAR PRODUCTS) IMPLANT
NDL BAYLIS TRANSSEPTAL 71CM (NEEDLE) IMPLANT
NEEDLE BAYLIS TRANSSEPTAL 71CM (NEEDLE) ×2 IMPLANT
PACK EP LATEX FREE (CUSTOM PROCEDURE TRAY) ×2
PACK EP LF (CUSTOM PROCEDURE TRAY) ×1 IMPLANT
PAD PRO RADIOLUCENT 2001M-C (PAD) ×2 IMPLANT
PATCH CARTO3 (PAD) ×1 IMPLANT
PENTARAY F 2-6-2MM (CATHETERS) ×2
PERCLOSE PROGLIDE 6F (VASCULAR PRODUCTS) ×4
SHEATH AVANTI 11F 11CM (SHEATH) ×1 IMPLANT
SHEATH PINNACLE 7F 10CM (SHEATH) ×2 IMPLANT
SHEATH PROBE COVER 6X72 (BAG) ×1 IMPLANT
SHEATH SWARTZ TS SL2 63CM 8.5F (SHEATH) ×1 IMPLANT
TUBING SMART ABLATE COOLFLOW (TUBING) ×1 IMPLANT

## 2018-10-31 NOTE — Interval H&P Note (Signed)
History and Physical Interval Note:  10/31/2018 7:23 AM  Courtney Pearson  has presented today for surgery, with the diagnosis of afib.  The various methods of treatment have been discussed with the patient and family. After consideration of risks, benefits and other options for treatment, the patient has consented to  Procedure(s): ATRIAL FIBRILLATION ABLATION (N/A) as a surgical intervention.  The patient's history has been reviewed, patient examined, no change in status, stable for surgery.  I have reviewed the patient's chart and labs.  Questions were answered to the patient's satisfaction.     Cardiac CT reviewed with patient She reports compliance with eliquis without interruption.  Thompson Grayer

## 2018-10-31 NOTE — Anesthesia Procedure Notes (Signed)
Procedure Name: Intubation Date/Time: 10/31/2018 7:52 AM Performed by: Marsa Aris, CRNA Pre-anesthesia Checklist: Patient identified, Emergency Drugs available, Suction available and Patient being monitored Patient Re-evaluated:Patient Re-evaluated prior to induction Oxygen Delivery Method: Circle System Utilized Preoxygenation: Pre-oxygenation with 100% oxygen Induction Type: IV induction Ventilation: Mask ventilation without difficulty Laryngoscope Size: Miller and 2 Grade View: Grade I Tube type: Oral Tube size: 7.0 mm Number of attempts: 1 Airway Equipment and Method: Stylet and Bite block Placement Confirmation: ETT inserted through vocal cords under direct vision,  positive ETCO2 and breath sounds checked- equal and bilateral Secured at: 21 cm Tube secured with: Tape Dental Injury: Teeth and Oropharynx as per pre-operative assessment  Comments: No change in dentition from pre-procedure

## 2018-10-31 NOTE — Discharge Instructions (Signed)

## 2018-10-31 NOTE — Anesthesia Preprocedure Evaluation (Addendum)
Anesthesia Evaluation  Patient identified by MRN, date of birth, ID band Patient awake    Reviewed: Allergy & Precautions, NPO status , Patient's Chart, lab work & pertinent test results, reviewed documented beta blocker date and time   Airway Mallampati: II  TM Distance: >3 FB Neck ROM: Full    Dental no notable dental hx. (+) Teeth Intact, Dental Advisory Given   Pulmonary neg pulmonary ROS,    Pulmonary exam normal breath sounds clear to auscultation       Cardiovascular hypertension, Pt. on medications and Pt. on home beta blockers Normal cardiovascular exam+ dysrhythmias Atrial Fibrillation  Rhythm:Irregular Rate:Normal  TTE 2019 - Left ventricle: The cavity size was normal. Wall thickness was normal. Systolic function was normal. The estimated ejection fraction was in the range of 55% to 60%. Wall motion was normal; there were no regional wall motion abnormalities. The study is not technically sufficient to allow evaluation of LV diastolic function. - Aortic valve: Trileaflet; mildly thickened, mildly calcified   leaflets. - Mitral valve: Calcified annulus. Mildly thickened leaflets . - Pulmonary arteries: Systolic pressure was mildly to moderately increased. PA peak pressure: 46 mm Hg (S). - Pericardium, extracardiac: A small pericardial effusion was identified circumferential to the heart, mostly along the posterior and right ventricular free wall. There was no evidence of hemodynamic compromise.  Event Monitor 2019 Quality: Fair.  Baseline artifact. Predominant rhythm: atrial fibrillation 58%.  Rates up to 110 bpm. Average heart rate: 77 bpm 3.3 second post conversion pause noted. PACs and PVCs, atrial bigeminy noted    Neuro/Psych negative neurological ROS  negative psych ROS   GI/Hepatic negative GI ROS, Neg liver ROS,   Endo/Other  diabetes (insulin pump), Insulin DependentHypothyroidism   Renal/GU Renal  InsufficiencyRenal disease  negative genitourinary   Musculoskeletal negative musculoskeletal ROS (+)   Abdominal   Peds  Hematology  (+) Blood dyscrasia (on eliquis), ,   Anesthesia Other Findings   Reproductive/Obstetrics                           Anesthesia Physical Anesthesia Plan  ASA: III  Anesthesia Plan: General   Post-op Pain Management:    Induction: Intravenous  PONV Risk Score and Plan: 3 and Midazolam, Dexamethasone and Ondansetron  Airway Management Planned: Oral ETT  Additional Equipment:   Intra-op Plan:   Post-operative Plan: Extubation in OR  Informed Consent: I have reviewed the patients History and Physical, chart, labs and discussed the procedure including the risks, benefits and alternatives for the proposed anesthesia with the patient or authorized representative who has indicated his/her understanding and acceptance.     Dental advisory given  Plan Discussed with: CRNA  Anesthesia Plan Comments: (Procedure aborted 2/2 OR equipment malfunction)       Anesthesia Quick Evaluation

## 2018-10-31 NOTE — Transfer of Care (Signed)
Immediate Anesthesia Transfer of Care Note  Patient: Courtney Pearson  Procedure(s) Performed: ATRIAL FIBRILLATION ABLATION (N/A )  Patient Location: Cath Lab  Anesthesia Type:General  Level of Consciousness: awake, alert  and oriented  Airway & Oxygen Therapy: Patient Spontanous Breathing  Post-op Assessment: Report given to RN and Post -op Vital signs reviewed and stable  Post vital signs: Reviewed and stable  Last Vitals:  Vitals Value Taken Time  BP 144/85 10/31/18 0949  Temp    Pulse 112 10/31/18 0952  Resp 17 10/31/18 0952  SpO2 98 % 10/31/18 0952  Vitals shown include unvalidated device data.  Last Pain:  Vitals:   10/31/18 0616  TempSrc: Oral  PainSc: 0-No pain         Complications: No apparent anesthesia complications

## 2018-10-31 NOTE — Progress Notes (Signed)
Call placed to Dr Lanetta Inch for updated CBG and Insulin orders.  Dr instructed for patient to resume basil rate with insulin pump and no order to treat acute cbg  [id number]

## 2018-11-01 ENCOUNTER — Telehealth (INDEPENDENT_AMBULATORY_CARE_PROVIDER_SITE_OTHER): Payer: Medicare Other | Admitting: Internal Medicine

## 2018-11-01 ENCOUNTER — Encounter (HOSPITAL_COMMUNITY): Payer: Self-pay | Admitting: Internal Medicine

## 2018-11-01 VITALS — BP 109/77 | HR 126 | Ht 67.0 in | Wt 195.0 lb

## 2018-11-01 DIAGNOSIS — I10 Essential (primary) hypertension: Secondary | ICD-10-CM

## 2018-11-01 DIAGNOSIS — I4819 Other persistent atrial fibrillation: Secondary | ICD-10-CM

## 2018-11-01 DIAGNOSIS — G4733 Obstructive sleep apnea (adult) (pediatric): Secondary | ICD-10-CM

## 2018-11-01 NOTE — Anesthesia Postprocedure Evaluation (Signed)
Anesthesia Post Note  Patient: Courtney Pearson  Procedure(s) Performed: ATRIAL FIBRILLATION ABLATION (N/A )     Patient location during evaluation: PACU Anesthesia Type: General Level of consciousness: awake and alert Pain management: pain level controlled Vital Signs Assessment: post-procedure vital signs reviewed and stable Respiratory status: spontaneous breathing, nonlabored ventilation, respiratory function stable and patient connected to nasal cannula oxygen Cardiovascular status: blood pressure returned to baseline and stable Postop Assessment: no apparent nausea or vomiting Anesthetic complications: no    Last Vitals:  Vitals:   10/31/18 1330 10/31/18 1400  BP: (!) 169/90 (!) 153/81  Pulse: (!) 52 95  Resp: 15 14  Temp:    SpO2: 98% 96%    Last Pain:  Vitals:   10/31/18 1400  TempSrc:   PainSc: 0-No pain                 Armeda Plumb L Sehaj Kolden

## 2018-11-15 ENCOUNTER — Telehealth: Payer: Self-pay | Admitting: *Deleted

## 2018-11-15 DIAGNOSIS — N1831 Chronic kidney disease, stage 3a: Secondary | ICD-10-CM | POA: Diagnosis not present

## 2018-11-15 DIAGNOSIS — Z23 Encounter for immunization: Secondary | ICD-10-CM | POA: Diagnosis not present

## 2018-11-15 DIAGNOSIS — E11319 Type 2 diabetes mellitus with unspecified diabetic retinopathy without macular edema: Secondary | ICD-10-CM | POA: Diagnosis not present

## 2018-11-15 DIAGNOSIS — E039 Hypothyroidism, unspecified: Secondary | ICD-10-CM | POA: Diagnosis not present

## 2018-11-15 DIAGNOSIS — E1022 Type 1 diabetes mellitus with diabetic chronic kidney disease: Secondary | ICD-10-CM | POA: Diagnosis not present

## 2018-11-15 DIAGNOSIS — Z794 Long term (current) use of insulin: Secondary | ICD-10-CM | POA: Diagnosis not present

## 2018-11-15 DIAGNOSIS — E1042 Type 1 diabetes mellitus with diabetic polyneuropathy: Secondary | ICD-10-CM | POA: Diagnosis not present

## 2018-11-15 DIAGNOSIS — E1165 Type 2 diabetes mellitus with hyperglycemia: Secondary | ICD-10-CM | POA: Diagnosis not present

## 2018-11-15 NOTE — Telephone Encounter (Signed)
Patient scheduled 12/18/18 with Dr Claiborne Billings

## 2018-11-15 NOTE — Telephone Encounter (Signed)
-----   Message from [name], MD sent at 11/06/2018  1:58 PM EDT ----- Regarding: FW: OSA Courtney Pearson, can we get her a follow up with Dr. Claiborne Billings? ----- Message ----- From: Thompson Grayer, MD Sent: 10/31/2018   7:21 AM EDT To: Troy Sine, MD, [name], MD Subject: OSA                                            Tom,  Ms Rallo has difficult to manage afib with severe biatrial enlargement. Recent CTA suggests pulmonary trunk enlargement concerning for pulmonary HTN.  Can you look into her CPAP therapy to make sure that it is optimized.  Thanks! Jeneen Rinks

## 2018-11-17 NOTE — Progress Notes (Signed)
Electrophysiology TeleHealth Note   Due to national recommendations of social distancing due to COVID 19, an audio/video telehealth visit is felt to be most appropriate for this patient at this time.  See MyChart message from today for the patient's consent to telehealth for [facility].  Date:  11/17/2018   ID:  Courtney Pearson, DOB 22-Jul-1950, MRN [id number]  Location: patient's home  Provider location:  [facility]  Evaluation Performed: Follow-up visit  PCP:  Jonathon Jordan, MD   Electrophysiologist:  Dr Rayann Heman  Chief Complaint:  palpitations  History of Present Illness:    Courtney Pearson is a 68 y.o. female who presents via telehealth conferencing today.  She presented for AF ablation yesterday however the procedure could not be performed due to failure of the intracardiac echo and 3D mapping systems.  She was discharged with plans to follow-up with me for further discussions.  She is doing ok today.  Her groin is fine.  Today, she denies symptoms of palpitations, chest pain, shortness of breath,  lower extremity edema, dizziness, presyncope, or syncope.     Past Medical History:  Diagnosis Date  . Diabetes ([place])   . HTN (hypertension)   . Hyperlipidemia   . Hypothyroidism   . Palpitation   . Persistent atrial fibrillation ([place]) 04/03/2017  . Renal insufficiency     Past Surgical History:  Procedure Laterality Date  . ATRIAL FIBRILLATION ABLATION N/A 01/01/2018   Procedure: ATRIAL FIBRILLATION ABLATION;  Surgeon: Thompson Grayer, MD;  Location: [place] CV LAB;  Service: Cardiovascular;  Laterality: N/A;  . ATRIAL FIBRILLATION ABLATION N/A 10/31/2018   Procedure: ATRIAL FIBRILLATION ABLATION;  Surgeon: Thompson Grayer, MD;  Location: [place] CV LAB;  Service: Cardiovascular;  Laterality: N/A;  . BREAST BIOPSY    . CARDIOVERSION N/A 04/30/2017   Procedure: CARDIOVERSION;  Surgeon: [name], MD;  Location: [facility] ENDOSCOPY;  Service: Cardiovascular;   Laterality: N/A;  . CARDIOVERSION N/A 02/15/2018   Procedure: CARDIOVERSION;  Surgeon: [name], MD;  Location: [facility] ENDOSCOPY;  Service: Cardiovascular;  Laterality: N/A;  . CARDIOVERSION N/A 04/10/2018   Procedure: CARDIOVERSION;  Surgeon: Josue Hector, MD;  Location: [place];  Service: Cardiovascular;  Laterality: N/A;  . CESAREAN SECTION    . EYE SURGERY      Current Outpatient Medications  Medication Sig Dispense Refill  . acetaminophen (TYLENOL) 500 MG tablet Take 500 mg by mouth daily as needed for moderate pain or headache.     Marland Kitchen atorvastatin (LIPITOR) 10 MG tablet Take 10 mg by mouth every other day.     . bismuth subsalicylate (PEPTO BISMOL) 262 MG/15ML suspension Take 30 mLs by mouth every 6 (six) hours as needed for indigestion.    . calcium carbonate (TUMS EX) 750 MG chewable tablet Chew 750 mg by mouth 2 (two) times daily.     . Cholecalciferol (VITAMIN D-3) 25 MCG (1000 UT) CAPS Take 1,000 Units by mouth daily.    Marland Kitchen ELIQUIS 5 MG TABS tablet TAKE 1 TABLET (5 MG TOTAL) BY MOUTH 2 (TWO) TIMES DAILY. (Patient taking differently: Take 5 mg by mouth 2 (two) times daily. ) 180 tablet 2  . fexofenadine (ALLEGRA) 180 MG tablet Take 180 mg by mouth daily as needed for allergies or rhinitis.    . furosemide (LASIX) 20 MG tablet Take 20 mg by mouth daily as needed for fluid.     . hydrocortisone cream 1 % Apply 1 application topically daily as needed for  itching.    . insulin aspart (NOVOLOG) 100 UNIT/ML injection Inject 25 Units into the skin daily. Via insulin pump    . loperamide (IMODIUM A-D) 2 MG tablet Take 2-4 mg by mouth as needed for diarrhea or loose stools.     Marland Kitchen losartan (COZAAR) 25 MG tablet Take 12.5 mg by mouth at bedtime.     . metoprolol tartrate (LOPRESSOR) 25 MG tablet Take 0.5 tablets (12.5 mg total) by mouth daily before breakfast. (BETA BLOCKER) (Patient taking differently: Take 12.5 mg by mouth 2 (two) times daily. (BETA BLOCKER)) 90 tablet 1  . Multiple  Vitamins-Minerals (MULTIVITAMIN ADULT PO) Take 1 tablet by mouth daily.    . potassium chloride (K-DUR) 10 MEQ tablet TAKE 1 TABLET BY MOUTH EVERY DAY (Patient taking differently: Take 10 mEq by mouth as needed (only take with lasix). ) 90 tablet 1  . SYNTHROID 75 MCG tablet Take 75 mcg by mouth daily before breakfast.     . metoprolol tartrate (LOPRESSOR) 100 MG tablet Take 1 tablet (100 mg total) by mouth once for 1 dose. Take one tablet by mouth 2 hours prior to cardiac CT for heart rate greater than 55 BMP 1 tablet 0   No current facility-administered medications for this visit.     Allergies:   Patient has no known allergies.   Social History:  The patient  reports that she has never smoked. She has never used smokeless tobacco. She reports that she does not drink alcohol.   Family History:  The patient's family history includes Anemia in her father; Asthma in her sister; Heart disease in an other family member; Hypertension in her mother; Leukemia in her brother; Lupus in her child; OCD in her sister; Osteoarthritis in her mother and sister; Osteoporosis in her mother.   ROS:  Please see the history of present illness.   All other systems are personally reviewed and negative.    Exam:    Vital Signs:  BP 109/77   Pulse (!) 126   Ht 5\' 7"  (1.702 m)   Wt 195 lb (88.5 kg)   BMI 30.54 kg/m   Well sounding and appearing, alert and conversant, regular work of breathing,  good skin color Eyes- anicteric, neuro- grossly intact, skin- no apparent rash or lesions or cyanosis, mouth- oral mucosa is pink  Labs/Other Tests and Data Reviewed:    Recent Labs: 10/28/2018: BUN 26; Creatinine, Ser 1.26; Hemoglobin 11.6; Platelets 167; Potassium 4.5; Sodium 140   Wt Readings from Last 3 Encounters:  11/01/18 195 lb (88.5 kg)  10/31/18 195 lb (88.5 kg)  10/25/18 195 lb 1.7 oz (88.5 kg)      ASSESSMENT & PLAN:    1.  Persistent afib She did not have her ablation yesterday because of  procedure failure.  We discussed at length today.  She has severe LA enlargement.  She failed ablation 12/19.   I worry that our long term abililty to maintain sinus is low given her structural heart disease.  We discussed options of repeat catheter ablation, convergent procedure, MAZE or rate control at length today.  She is not currently ready to return for repeat ablation. She would like to think about her options further.  2. pulmonary hypertension? Severe OSA Noted on cardiac CT I will have her follow-up with Dr Claiborne Billings to make sure that her OSA treatments have been appropriately optimized  3. HTN Stable No change required today   Follow-up:  She will think about her options  and follow-up with me in 4 weeks   Patient Risk:  after full review of this patients clinical status, I feel that they are at moderate risk at this time.  Today, I have spent 15 minutes with the patient with telehealth technology discussing arrhythmia management .    Signed, Thompson Grayer, MD    [place] [address] [phone] (office) [phone] (fax)

## 2018-11-20 DIAGNOSIS — M25641 Stiffness of right hand, not elsewhere classified: Secondary | ICD-10-CM | POA: Diagnosis not present

## 2018-11-27 ENCOUNTER — Encounter (INDEPENDENT_AMBULATORY_CARE_PROVIDER_SITE_OTHER): Payer: Medicare Other | Admitting: Ophthalmology

## 2018-11-27 DIAGNOSIS — E10319 Type 1 diabetes mellitus with unspecified diabetic retinopathy without macular edema: Secondary | ICD-10-CM

## 2018-11-27 DIAGNOSIS — H43813 Vitreous degeneration, bilateral: Secondary | ICD-10-CM

## 2018-11-27 DIAGNOSIS — E103593 Type 1 diabetes mellitus with proliferative diabetic retinopathy without macular edema, bilateral: Secondary | ICD-10-CM | POA: Diagnosis not present

## 2018-11-27 DIAGNOSIS — H35033 Hypertensive retinopathy, bilateral: Secondary | ICD-10-CM | POA: Diagnosis not present

## 2018-11-27 DIAGNOSIS — I10 Essential (primary) hypertension: Secondary | ICD-10-CM

## 2018-11-28 ENCOUNTER — Ambulatory Visit (HOSPITAL_COMMUNITY): Payer: Medicare Other | Admitting: Physician Assistant

## 2018-11-28 ENCOUNTER — Telehealth (INDEPENDENT_AMBULATORY_CARE_PROVIDER_SITE_OTHER): Payer: Medicare Other | Admitting: Internal Medicine

## 2018-11-28 ENCOUNTER — Encounter: Payer: Self-pay | Admitting: Internal Medicine

## 2018-11-28 VITALS — BP 156/56 | HR 59 | Ht 67.0 in | Wt 195.0 lb

## 2018-11-28 DIAGNOSIS — G4733 Obstructive sleep apnea (adult) (pediatric): Secondary | ICD-10-CM | POA: Diagnosis not present

## 2018-11-28 DIAGNOSIS — I4819 Other persistent atrial fibrillation: Secondary | ICD-10-CM | POA: Diagnosis not present

## 2018-11-28 DIAGNOSIS — I10 Essential (primary) hypertension: Secondary | ICD-10-CM | POA: Diagnosis not present

## 2018-11-28 NOTE — Progress Notes (Signed)
Electrophysiology TeleHealth Note   Due to national recommendations of social distancing due to COVID 19, an audio/video telehealth visit is felt to be most appropriate for this patient at this time.  See MyChart message from today for the patient's consent to telehealth for [facility].    Date:  11/28/2018   ID:  Courtney Pearson, DOB March 09, 1950, MRN [id number]  Location: patient's home  Provider location:  [facility]  Evaluation Performed: Follow-up visit  PCP:  Jonathon Jordan, MD   Electrophysiologist:  Dr Rayann Heman  Chief Complaint:  palpitations  History of Present Illness:    Courtney Pearson is a 68 y.o. female who presents via telehealth conferencing today.  Since last being seen in our clinic, the patient reports doing very well.  She spontaneously has developed bradycaria.  Heart rates have been 40s for the past week.  Her fatigue is unchanged. Today, she denies symptoms of palpitations, chest pain, shortness of breath,  lower extremity edema, dizziness, presyncope, or syncope.  The patient is otherwise without complaint today.  The patient denies symptoms of fevers, chills, cough, or new SOB worrisome for COVID 19.  Past Medical History:  Diagnosis Date  . Diabetes ([place])   . HTN (hypertension)   . Hyperlipidemia   . Hypothyroidism   . Palpitation   . Persistent atrial fibrillation ([place]) 04/03/2017  . Renal insufficiency     Past Surgical History:  Procedure Laterality Date  . ATRIAL FIBRILLATION ABLATION N/A 01/01/2018   Procedure: ATRIAL FIBRILLATION ABLATION;  Surgeon: Thompson Grayer, MD;  Location: [place] CV LAB;  Service: Cardiovascular;  Laterality: N/A;  . ATRIAL FIBRILLATION ABLATION N/A 10/31/2018   Procedure: ATRIAL FIBRILLATION ABLATION;  Surgeon: Thompson Grayer, MD;  Location: [place] CV LAB;  Service: Cardiovascular;  Laterality: N/A;  . BREAST BIOPSY    . CARDIOVERSION N/A 04/30/2017   Procedure: CARDIOVERSION;  Surgeon: [name], MD;  Location: [facility] ENDOSCOPY;  Service: Cardiovascular;  Laterality: N/A;  . CARDIOVERSION N/A 02/15/2018   Procedure: CARDIOVERSION;  Surgeon: [name], MD;  Location: [facility] ENDOSCOPY;  Service: Cardiovascular;  Laterality: N/A;  . CARDIOVERSION N/A 04/10/2018   Procedure: CARDIOVERSION;  Surgeon: Josue Hector, MD;  Location: [place];  Service: Cardiovascular;  Laterality: N/A;  . CESAREAN SECTION    . EYE SURGERY      Current Outpatient Medications  Medication Sig Dispense Refill  . acetaminophen (TYLENOL) 500 MG tablet Take 500 mg by mouth daily as needed for moderate pain or headache.     Marland Kitchen atorvastatin (LIPITOR) 10 MG tablet Take 10 mg by mouth every other day.     . bismuth subsalicylate (PEPTO BISMOL) 262 MG/15ML suspension Take 30 mLs by mouth every 6 (six) hours as needed for indigestion.    . calcium carbonate (TUMS EX) 750 MG chewable tablet Chew 750 mg by mouth 2 (two) times daily.     . Cholecalciferol (VITAMIN D-3) 25 MCG (1000 UT) CAPS Take 1,000 Units by mouth daily.    Marland Kitchen ELIQUIS 5 MG TABS tablet TAKE 1 TABLET (5 MG TOTAL) BY MOUTH 2 (TWO) TIMES DAILY. (Patient taking differently: Take 5 mg by mouth 2 (two) times daily. ) 180 tablet 2  . fexofenadine (ALLEGRA) 180 MG tablet Take 180 mg by mouth daily as needed for allergies or rhinitis.    . furosemide (LASIX) 20 MG tablet Take 20 mg by mouth daily as needed for fluid.     . hydrocortisone cream 1 %  Apply 1 application topically daily as needed for itching.    . insulin aspart (NOVOLOG) 100 UNIT/ML injection Inject 25 Units into the skin daily. Via insulin pump    . loperamide (IMODIUM A-D) 2 MG tablet Take 2-4 mg by mouth as needed for diarrhea or loose stools.     Marland Kitchen losartan (COZAAR) 25 MG tablet Take 12.5 mg by mouth at bedtime.     . metoprolol tartrate (LOPRESSOR) 25 MG tablet Take 0.5 tablets (12.5 mg total) by mouth daily before breakfast. (BETA BLOCKER) (Patient taking differently: Take 12.5 mg by  mouth 2 (two) times daily. (BETA BLOCKER)) 90 tablet 1  . Multiple Vitamins-Minerals (MULTIVITAMIN ADULT PO) Take 1 tablet by mouth daily.    . potassium chloride (K-DUR) 10 MEQ tablet TAKE 1 TABLET BY MOUTH EVERY DAY (Patient taking differently: Take 10 mEq by mouth as needed (only take with lasix). ) 90 tablet 1  . SYNTHROID 75 MCG tablet Take 75 mcg by mouth daily before breakfast.     . metoprolol tartrate (LOPRESSOR) 100 MG tablet Take 1 tablet (100 mg total) by mouth once for 1 dose. Take one tablet by mouth 2 hours prior to cardiac CT for heart rate greater than 55 BMP 1 tablet 0   No current facility-administered medications for this visit.     Allergies:   Patient has no known allergies.   Social History:  The patient  reports that she has never smoked. She has never used smokeless tobacco. She reports that she does not drink alcohol.   Family History:  The patient's family history includes Anemia in her father; Asthma in her sister; Heart disease in an other family member; Hypertension in her mother; Leukemia in her brother; Lupus in her child; OCD in her sister; Osteoarthritis in her mother and sister; Osteoporosis in her mother.   ROS:  Please see the history of present illness.   All other systems are personally reviewed and negative.    Exam:    Vital Signs:  BP (!) 156/56   Pulse (!) 59   Ht 5\' 7"  (1.702 m)   Wt 195 lb (88.5 kg)   BMI 30.54 kg/m   Well sounding and appearing, alert and conversant, regular work of breathing,  good skin color Eyes- anicteric, neuro- grossly intact, skin- no apparent rash or lesions or cyanosis, mouth- oral mucosa is pink  Labs/Other Tests and Data Reviewed:    Recent Labs: 10/28/2018: BUN 26; Creatinine, Ser 1.26; Hemoglobin 11.6; Platelets 167; Potassium 4.5; Sodium 140   Wt Readings from Last 3 Encounters:  11/28/18 195 lb (88.5 kg)  11/26/18 197 lb (89.4 kg)  11/01/18 195 lb (88.5 kg)      ASSESSMENT & PLAN:    1.  Persistent  atrial fibrillation V rates have reduced to 40s. Stop metoprolol Return to AF clinic in 3-5 days for ekg off metoprolol She is s/p PVI 12/2017.   Given severe LA enlargement, I am not optimistic about PVI. We again discussed MAZE or convergent procedure vs rate control today. She would like to continue rate control for now She is appropriately anticoagualted  She is planning to attend the [facility] AF clinic program classes  2. HTN Elevated Increase lostartan to 25mg  daily.  May need to increase to 50mg  daily  3. Pulmonary hypertension/ OSA Followed by Dr Claiborne Billings   Follow-up:  AF clinic next week   Patient Risk:  after full review of this patients clinical status, I feel that they  are at high risk at this time.  Today, I have spent 20 minutes with the patient with telehealth technology discussing arrhythmia management .    Army Fossa, MD  11/28/2018 10:17 AM     [facility] [address] [place] [address] [phone] (office) [phone] (fax)

## 2018-12-02 ENCOUNTER — Other Ambulatory Visit: Payer: Self-pay

## 2018-12-02 ENCOUNTER — Ambulatory Visit (HOSPITAL_COMMUNITY)
Admission: RE | Admit: 2018-12-02 | Discharge: 2018-12-02 | Disposition: A | Discharge status: Home / Self Care | Payer: Medicare Other | Source: Ambulatory Visit | Attending: Nurse Practitioner | Admitting: Nurse Practitioner

## 2018-12-02 ENCOUNTER — Encounter (HOSPITAL_COMMUNITY): Payer: Self-pay | Admitting: Nurse Practitioner

## 2018-12-02 VITALS — BP 162/70 | HR 62 | Ht 67.0 in | Wt 194.6 lb

## 2018-12-02 DIAGNOSIS — I480 Paroxysmal atrial fibrillation: Secondary | ICD-10-CM | POA: Insufficient documentation

## 2018-12-02 DIAGNOSIS — R0609 Other forms of dyspnea: Secondary | ICD-10-CM | POA: Insufficient documentation

## 2018-12-02 DIAGNOSIS — E039 Hypothyroidism, unspecified: Secondary | ICD-10-CM | POA: Diagnosis not present

## 2018-12-02 DIAGNOSIS — G4733 Obstructive sleep apnea (adult) (pediatric): Secondary | ICD-10-CM | POA: Insufficient documentation

## 2018-12-02 DIAGNOSIS — E119 Type 2 diabetes mellitus without complications: Secondary | ICD-10-CM | POA: Insufficient documentation

## 2018-12-02 DIAGNOSIS — I4819 Other persistent atrial fibrillation: Secondary | ICD-10-CM

## 2018-12-02 DIAGNOSIS — Z79899 Other long term (current) drug therapy: Secondary | ICD-10-CM | POA: Diagnosis not present

## 2018-12-02 DIAGNOSIS — I10 Essential (primary) hypertension: Secondary | ICD-10-CM | POA: Diagnosis not present

## 2018-12-02 DIAGNOSIS — Z8249 Family history of ischemic heart disease and other diseases of the circulatory system: Secondary | ICD-10-CM | POA: Insufficient documentation

## 2018-12-02 DIAGNOSIS — E785 Hyperlipidemia, unspecified: Secondary | ICD-10-CM | POA: Insufficient documentation

## 2018-12-02 DIAGNOSIS — Z7901 Long term (current) use of anticoagulants: Secondary | ICD-10-CM | POA: Insufficient documentation

## 2018-12-02 DIAGNOSIS — Z794 Long term (current) use of insulin: Secondary | ICD-10-CM | POA: Diagnosis not present

## 2018-12-02 MED ORDER — FUROSEMIDE 20 MG PO TABS: 20.0000 mg | ORAL_TABLET | Freq: Every day | ORAL | 3 refills | Status: DC | PRN

## 2018-12-02 MED ORDER — LOSARTAN POTASSIUM 50 MG PO TABS: 50.0000 mg | ORAL_TABLET | Freq: Every day | ORAL | 1 refills | Status: DC

## 2018-12-02 NOTE — Progress Notes (Addendum)
Electrophysiology  Note   Date:  12/02/2018   ID:  Courtney Pearson, DOB 03/10/1950, MRN [id number]   Provider location: [address] [place], [place] [address] Evaluation Performed: Follow up afib    PCP:  Courtney Jordan, MD  Primary Cardiologist:  Dr. Oval Linsey Primary Electrophysiologist: Dr. Rayann Heman   CC: f/u afib    History of Present Illness: Courtney Pearson is a 68 y.o. female who presents  in the afib clinic for persistent afib "for a while." She has noted increased exertional dyspnea.  Afib ablation 12/2017. Pt had to be cardioverted about one month after ablation and again when she saw Dr. Rayann Heman at the 3 month f/u early March. He offered DCCV, Tikosyn, amiodarone or repeat ablation.  She had reduced her BB to 1/2 tab a day as when she is in SR, she will have HR's in the 40's. She did miss one dose of eliquis last Thursday pm. Discussed options with her, amiodarone least favorite. Wants to discuss repeat ablation with Dr. Rayann Heman.   F/u in afib clinic, 12/02/18. She was taken back for repeat ablation but there was equipment failure and she was not able to have her ablation completed. She has seen Dr. Rayann Heman back and so far she is not ready to go back for procedure. On last visit, she was having issues with bradycardia and BB was stopped. Today, she is in SR with v rates of 62 bpm. Her BP has been elevated and Dr. Rayann Heman increaased losartan to 25 mg daily saying that this may need to be increased to 50 mg daily. I will do this today with a BP of 162/70 and get her back to Dr. Oval Linsey for follow up.   Today, she denies symptoms of palpitations, chest pain, + for shortness of breath with exertion, no orthopnea, PND, lower extremity edema, claudication, dizziness, presyncope, syncope, bleeding, or neurologic sequela. The patient is tolerating medications without difficulties and is otherwise without complaint today.   she denies symptoms of cough, fevers, chills, or new SOB  worrisome for COVID 19.     Atrial Fibrillation Risk Factors:  she does not have symptoms or diagnosis of sleep apnea.  therapy. she does not have a history of rheumatic fever. she does not have a history of alcohol use. The patient does not have a history of early familial atrial fibrillation or other arrhythmias.  she has a BMI of Body mass index is 30.48 kg/m.Marland Kitchen Filed Weights   12/02/18 1104  Weight: 88.3 kg    Past Medical History:  Diagnosis Date  . Diabetes ([place])   . HTN (hypertension)   . Hyperlipidemia   . Hypothyroidism   . Palpitation   . Persistent atrial fibrillation ([place]) 04/03/2017  . Renal insufficiency    Past Surgical History:  Procedure Laterality Date  . ATRIAL FIBRILLATION ABLATION N/A 01/01/2018   Procedure: ATRIAL FIBRILLATION ABLATION;  Surgeon: Thompson Grayer, MD;  Location: [place] CV LAB;  Service: Cardiovascular;  Laterality: N/A;  . ATRIAL FIBRILLATION ABLATION N/A 10/31/2018   Procedure: ATRIAL FIBRILLATION ABLATION;  Surgeon: Thompson Grayer, MD;  Location: [place] CV LAB;  Service: Cardiovascular;  Laterality: N/A;  . BREAST BIOPSY    . CARDIOVERSION N/A 04/30/2017   Procedure: CARDIOVERSION;  Surgeon: [name], MD;  Location[facility] ENDOSCOPY;  Service: Cardiovascular;  Laterality: N/A;  . CARDIOVERSION N/A 02/15/2018   Procedure: CARDIOVERSION;  Surgeon: [name], MD;  Location: [place];  Service: Cardiovascular;  Laterality: N/A;  .  CARDIOVERSION N/A 04/10/2018   Procedure: CARDIOVERSION;  Surgeon: Josue Hector, MD;  Location: [facility] ENDOSCOPY;  Service: Cardiovascular;  Laterality: N/A;  . CESAREAN SECTION    . EYE SURGERY       Current Outpatient Medications  Medication Sig Dispense Refill  . acetaminophen (TYLENOL) 500 MG tablet Take 500 mg by mouth daily as needed for moderate pain or headache.     Marland Kitchen atorvastatin (LIPITOR) 10 MG tablet Take 10 mg by mouth every other day.     . bismuth subsalicylate (PEPTO  BISMOL) 262 MG/15ML suspension Take 30 mLs by mouth every 6 (six) hours as needed for indigestion.    . calcium carbonate (TUMS EX) 750 MG chewable tablet Chew 750 mg by mouth 2 (two) times daily.     . Cholecalciferol (VITAMIN D-3) 25 MCG (1000 UT) CAPS Take 1,000 Units by mouth daily.    Marland Kitchen ELIQUIS 5 MG TABS tablet TAKE 1 TABLET (5 MG TOTAL) BY MOUTH 2 (TWO) TIMES DAILY. (Patient taking differently: Take 5 mg by mouth 2 (two) times daily. ) 180 tablet 2  . fexofenadine (ALLEGRA) 180 MG tablet Take 180 mg by mouth daily as needed for allergies or rhinitis.    . furosemide (LASIX) 20 MG tablet Take 20 mg by mouth daily as needed for fluid.     . hydrocortisone cream 1 % Apply 1 application topically daily as needed for itching.    . insulin aspart (NOVOLOG) 100 UNIT/ML injection Inject 25 Units into the skin daily. Via insulin pump    . loperamide (IMODIUM A-D) 2 MG tablet Take 2-4 mg by mouth as needed for diarrhea or loose stools.     Marland Kitchen losartan (COZAAR) 25 MG tablet Take 25 mg by mouth daily.     . Multiple Vitamins-Minerals (MULTIVITAMIN ADULT PO) Take 1 tablet by mouth daily.    . potassium chloride (K-DUR) 10 MEQ tablet TAKE 1 TABLET BY MOUTH EVERY DAY (Patient taking differently: Take 10 mEq by mouth as needed (only take with lasix). ) 90 tablet 1  . SYNTHROID 75 MCG tablet Take 75 mcg by mouth daily before breakfast.      No current facility-administered medications for this encounter.     Allergies:   Patient has no known allergies.   Social History:  The patient  reports that she has never smoked. She has never used smokeless tobacco. She reports that she does not drink alcohol.   Family History:  The patient's  family history includes Anemia in her father; Asthma in her sister; Heart disease in an other family member; Hypertension in her mother; Leukemia in her brother; Lupus in her child; OCD in her sister; Osteoarthritis in her mother and sister; Osteoporosis in her mother.     ROS:  Please see the history of present illness.   All other systems are personally reviewed and negative.   Exam: GEN- The patient is well appearing, alert and oriented x 3 today.   Head- normocephalic, atraumatic Eyes-  Sclera clear, conjunctiva pink Ears- hearing intact Oropharynx- clear Lungs- Clear to ausculation bilaterally, normal work of breathing Heart- irregular rate and rhythm, no murmurs, rubs or gallops, PMI not laterally displaced GI- soft, NT, ND, + BS Extremities- no clubbing, cyanosis, or edema   Recent Labs: 10/28/2018: BUN 26; Creatinine, Ser 1.26; Hemoglobin 11.6; Platelets 167; Potassium 4.5; Sodium 140  personally reviewed    Other studies personally reviewed: Epic records reviewed  EKG- NSR at 62 bpm, pr int 208  ms, qrs int 88 ms, qtc 442 ms   ASSESSMENT AND PLAN:  1. Paroxysmal atrial fibrillation Pt with ablation in December 2019 and two cardioversions since then Repeat ablation in October 2020 not completed due to equipment failure. BB recently stopped due to v rates in the 40's Now in SR at 62 bpm Continue eliquis 5 mg bid,for [id number] score of at least 4 Continue  cpap for OSA   2. HTN Is seeing better readings since Dr. Rayann Heman increased form 12.5 mg daily to 25 mg daily , but still has several systolic's in the [id number] range   Losartan increased to 50 mg daily today for BP of 160/100 Will request an appointment with Dr. Oval Linsey or an APP on her team  in the next 2-3 weeks to f/u BP  F/u with Dr. Rayann Heman  end of November per recall afib clinic as needed Dr. Oval Linsey appointment requested    Labs/ tests ordered today include: {NONE   Orders Placed This Encounter  Procedures  . EKG 12-Lead    Patient Risk:  after full review of this patients clinical status, I feel that they are at  MODERATE risk at this time.    SignedRoderic Palau NP 12/02/2018 11:08 AM  Afib [facility] [address] [place], [place]  [address] [phone]

## 2018-12-02 NOTE — Patient Instructions (Signed)
Increase losartan to 50mg  a day  Follow up with Dr. Blenda Mounts office in 2-3 weeks - office will call you for appt.

## 2018-12-02 NOTE — Addendum Note (Signed)
Encounter addended by: Sherran Needs, NP on: 12/02/2018 11:59 AM  Actions taken: Clinical Note Signed

## 2018-12-11 NOTE — Progress Notes (Signed)
[facility] [employer] PREP Weekly Session   Patient Details  Name: Courtney Pearson MRN: [id number] Date of Birth: 06-Sep-1950 Age: 68 y.o. PCP: Jonathon Jordan, MD  There were no vitals filed for this visit.  Initial Fitness Assessment: Cardio: 2 min. March: 140 30 Sec chair stand: 3.5 Arm Curl: RT/5lbs: 16  Balance:  Double: 0 Single: 6 Tandem: 4 (assist)    Vanita Ingles 12/11/2018, 3:40 PM

## 2018-12-18 ENCOUNTER — Encounter: Payer: Self-pay | Admitting: Cardiovascular Disease

## 2018-12-18 ENCOUNTER — Ambulatory Visit: Payer: Medicare Other | Admitting: Cardiology

## 2018-12-18 ENCOUNTER — Ambulatory Visit (INDEPENDENT_AMBULATORY_CARE_PROVIDER_SITE_OTHER): Payer: Medicare Other | Admitting: Cardiovascular Disease

## 2018-12-18 ENCOUNTER — Other Ambulatory Visit: Payer: Self-pay

## 2018-12-18 VITALS — BP 142/76 | HR 78 | Temp 96.4°F | Ht 67.0 in | Wt 192.4 lb

## 2018-12-18 DIAGNOSIS — R600 Localized edema: Secondary | ICD-10-CM

## 2018-12-18 DIAGNOSIS — E039 Hypothyroidism, unspecified: Secondary | ICD-10-CM

## 2018-12-18 DIAGNOSIS — I10 Essential (primary) hypertension: Secondary | ICD-10-CM

## 2018-12-18 DIAGNOSIS — I480 Paroxysmal atrial fibrillation: Secondary | ICD-10-CM

## 2018-12-18 DIAGNOSIS — G4733 Obstructive sleep apnea (adult) (pediatric): Secondary | ICD-10-CM

## 2018-12-18 DIAGNOSIS — E108 Type 1 diabetes mellitus with unspecified complications: Secondary | ICD-10-CM

## 2018-12-18 DIAGNOSIS — E781 Pure hyperglyceridemia: Secondary | ICD-10-CM

## 2018-12-18 DIAGNOSIS — I4891 Unspecified atrial fibrillation: Secondary | ICD-10-CM | POA: Diagnosis not present

## 2018-12-18 MED ORDER — METOPROLOL TARTRATE 25 MG PO TABS: 12.5000 mg | ORAL_TABLET | Freq: Two times a day (BID) | ORAL | 3 refills | Status: DC

## 2018-12-18 NOTE — Progress Notes (Signed)
[facility] [employer] PREP Weekly Session   Patient Details  Name: Courtney Pearson MRN: [id number] Date of Birth: 1950-05-10 Age: 68 y.o. PCP: Jonathon Jordan, MD  Vitals:   12/18/18 1233  Pulse: (!) 120  Weight: 194 lb (88 kg)    [facility] [employer] Weekly seesion - 12/18/18 1200      Weekly Session   Topic Discussed  Eating for the season    Minutes exercised this week  --   Cardio 10 min, did not report strength of 30 min        Pam M Devonta Blanford 12/18/2018, 12:36 PM [facility] [employer] PREP Weekly Session   Fun Things you did since last meeting: Saw a couple of friends Things you are grateful for: Family and security Nutrition celebration for the week: skipped a couple of snacks       Barnett Hatter 12/18/2018, 12:35 PM

## 2018-12-18 NOTE — Patient Instructions (Signed)
Medication Instructions:  Your physician has recommended you make the following change in your medication:  RESTART YOUR METOPROLOL TARTRATE AT 12.5 MG BY MOUTH TWICE A DAY  *If you need a refill on your cardiac medications before your next appointment, please call your pharmacy*  Lab Work: Your physician recommends that you return for lab work TODAY: CBC, CMP, TSH, MAGNESIUM  If you have labs (blood work) drawn today and your tests are completely normal, you will receive your results only by: Marland Kitchen MyChart Message (if you have MyChart) OR . A paper copy in the mail If you have any lab test that is abnormal or we need to change your treatment, we will call you to review the results.  Testing/Procedures: Your physician has requested that you have an echocardiogram. Echocardiography is a painless test that uses sound waves to create images of your heart. It provides your doctor with information about the size and shape of your heart and how well your heart's chambers and valves are working. This procedure takes approximately one hour. There are no restrictions for this procedure. LOCATION: [facility]: [address], [place], [place] [address]     Follow-Up: At [facility], you and your health needs are our priority.  As part of our continuing mission to provide you with exceptional heart care, we have created designated Provider Care Teams.  These Care Teams include your primary Cardiologist (physician) and Advanced Practice Providers (APPs -  Physician Assistants and Nurse Practitioners) who all work together to provide you with the care you need, when you need it.  Your next appointment:   12 month(s)  The format for your next appointment:   In Person  Provider:   Shelva Majestic, MD  Other Instructions  SCHEDULE A FOLLOW APPOINTMENT WITH DR. ALLRED  FOLLOW UP WITH DR. [place] IN 3 MONTHS

## 2018-12-18 NOTE — Progress Notes (Signed)
[facility] PREP Weekly Session   Patient Details  Name: Courtney Pearson MRN: [id number] Date of Birth: 03/02/1950 Age: 68 y.o. PCP: Jonathon Jordan, MD  Vitals:   12/10/18 1631  Weight: 195 lb 6.4 oz (88.6 kg)    [facility] Weekly seesion - 12/18/18 1600      Weekly Session   Topic Discussed  Healthy eating tips;Health habits    Minutes exercised this week  0 minutes   none reported        Barnett Hatter 12/18/2018, 4:33 PM

## 2018-12-18 NOTE — Progress Notes (Signed)
Cardiology Office Note    Date:  12/21/2018   ID:  JENI DULING, DOB October 16, 1950, MRN [id number]  PCP:  Jonathon Jordan, MD  Cardiologist:  Shelva Majestic, MD (sleep); Dr. Oval Linsey  F/U Sleep evaluation  History of Present Illness:  NADIAH CORBIT is a 68 y.o. female who has a history of obstructive sleep apnea treated with CPAP.  She presents for 1 year follow-up sleep evaluation.  Ms Beegle is a patient of Dr. Oval Linsey and Dr. Rayann Heman who has a history of atrial flutter, atrial fibrillation hypertension, hyperlipidemia, and diabetes mellitus.  She  underwent ablation on January 01, 2018 for paroxysmal atrial fibrillation and isthmus dependent right atrial flutter.   Due to concerns for obstructive sleep apnea including daytime sleepiness, snoring, nonrestorative sleep, and her arrhythmias she was referred for a sleep study.  Her sleep study was done on September 16, 2017 and revealed severe sleep apnea with an AHI of 43.9/h.  Sleep apnea was more severe with REM sleep with an AHI at 68.6/h.  She had significant oxygen desaturation to a nadir of 79%.  She met split-night protocol and CPAP was implemented and titrated up to 9 cm of water pressure.  She has been on CPAP therapy since October 30, 2017 with [employer] as her DME company.  When I saw her for my initial evaluation in December 2019 she had excellent compliance with CPAP therapy.  A download from November 18 through January 08, 2018 showed 100% compliance with average CPAP use at 7 hours and 31 minutes.  At 9 cm water pressure, AHI was excellent at 0.5.  She was typically going to bed between 11:30 PM and midnight and waking up at 7 AM.  She was using a nasal pillow mask.  She did not have a mask leak but at times noted some mild irritation from the nasal pillows.  She felt significantly improved since CPAP initiation.  Prior nocturia 4-5 times per night improved to 1 time per night.  She was more alert and more energy.  During that  evaluation, she also was noted to have 2+ pitting edema to her lower extremity and I recommended furosemide 20 mg in addition to support stockings.  She was maintaining sinus rhythm on metoprolol 12.5 mg/day and was anticoagulated with Eliquis.  She continues to be on levothyroxine 75 mcg for hypothyroidism and was on atorvastatin for hyperlipidemia.  Since I last saw her in December 2019, she has had recurrent problems with recurrent paroxysmal atrial fibrillation.  There was another attempt at an ablation in October 2020, however there was equipment failure and as result she was not able to be ablated.  She has had issues with hypertension.  She admits to continued CPAP use.  A new download was obtained in the office today from October 25 through December 16, 2018.  She is 100% compliant and averaging 8 hours and 31 minutes of CPAP use per night.  At her 9 cm water pressure, AHI is excellent at 1.1/h.  There is no mask leak.  She presents today for yearly sleep evaluation.  Past Medical History:  Diagnosis Date   Diabetes ([place])    HTN (hypertension)    Hyperlipidemia    Hypothyroidism    Palpitation    Persistent atrial fibrillation ([place]) 04/03/2017   Renal insufficiency     Past Surgical History:  Procedure Laterality Date   ATRIAL FIBRILLATION ABLATION N/A 01/01/2018   Procedure: ATRIAL FIBRILLATION ABLATION;  Surgeon: Thompson Grayer, MD;  Location: [place] [facility];  Service: Cardiovascular;  Laterality: N/A;   ATRIAL FIBRILLATION ABLATION N/A 10/31/2018   Procedure: ATRIAL FIBRILLATION ABLATION;  Surgeon: Thompson Grayer, MD;  Location: [place] [facility];  Service: Cardiovascular;  Laterality: N/A;   BREAST BIOPSY     CARDIOVERSION N/A 04/30/2017   Procedure: CARDIOVERSION;  Surgeon: [name], MD;  Location: [place];  Service: Cardiovascular;  Laterality: N/A;   CARDIOVERSION N/A 02/15/2018   Procedure: CARDIOVERSION;  Surgeon: [name], MD;  Location: [place];  Service: Cardiovascular;  Laterality: N/A;   CARDIOVERSION N/A 04/10/2018   Procedure: CARDIOVERSION;  Surgeon: Josue Hector, MD;  Location: [facility] END[facility];  Service: Cardiovascular;  Laterality: N/A;   CESAREAN SECTION     EYE SURGERY      Current Medications: Outpatient Medications Prior to Visit  Medication Sig Dispense Refill   acetaminophen (TYLENOL) 500 MG tablet Take 500 mg by mouth daily as needed for moderate pain or headache.      atorvastatin (LIPITOR) 10 MG tablet Take 10 mg by mouth every other day.      bismuth subsalicylate (PEPTO BISMOL) 262 MG/15ML suspension Take 30 mLs by mouth every 6 (six) hours as needed for indigestion.     calcium carbonate (TUMS EX) 750 MG chewable tablet Chew 750 mg by mouth 2 (two) times daily.      Cholecalciferol (VITAMIN D-3) 25 MCG (1000 UT) CAPS Take 1,000 Units by mouth daily.     ELIQUIS 5 MG TABS tablet TAKE 1 TABLET (5 MG TOTAL) BY MOUTH 2 (TWO) TIMES DAILY. (Patient taking differently: Take 5 mg by mouth 2 (two) times daily. ) 180 tablet 2   fexofenadine (ALLEGRA) 180 MG tablet Take 180 mg by mouth daily as needed for allergies or rhinitis.     furosemide (LASIX) 20 MG tablet Take 1 tablet (20 mg total) by mouth daily as needed for fluid. 30 tablet 3   hydrocortisone cream 1 % Apply 1 application topically daily as needed for itching.     insulin aspart (NOVOLOG) 100 UNIT/ML injection Inject 25 Units into the skin daily. Via insulin pump     levothyroxine (SYNTHROID) 88 MCG tablet      loperamide (IMODIUM A-D) 2 MG tablet Take 2-4 mg by mouth as needed for diarrhea or loose stools.      losartan (COZAAR) 50 MG tablet Take 1 tablet (50 mg total) by mouth daily. 90 tablet 1   Multiple Vitamins-Minerals (MULTIVITAMIN ADULT PO) Take 1 tablet by mouth daily.     potassium chloride (K-DUR) 10 MEQ tablet TAKE 1 TABLET BY MOUTH EVERY DAY (Patient taking differently: Take 10 mEq by mouth as needed (only take with  lasix). ) 90 tablet 1   SYNTHROID 75 MCG tablet Take 75 mcg by mouth daily before breakfast.      No facility-administered medications prior to visit.      Allergies:   Patient has no known allergies.   Social History   Socioeconomic History   Marital status: Married    Spouse name: Not on file   Number of children: 2   Years of education: Not on file   Highest education level: Not on file  Occupational History   Not on file  Social Needs   Financial resource strain: Not on file   Food insecurity    Worry: Not on file    Inability: Not on file   Transportation needs    Medical: Not on file  Non-medical: Not on file  Tobacco Use   Smoking status: Never Smoker   Smokeless tobacco: Never Used  Substance and Sexual Activity   Alcohol use: No   Drug use: Not on file   Sexual activity: Not on file  Lifestyle   Physical activity    Days per week: Not on file    Minutes per session: Not on file   Stress: Not on file  Relationships   Social connections    Talks on phone: Not on file    Gets together: Not on file    Attends religious service: Not on file    Active member of club or organization: Not on file    Attends meetings of clubs or organizations: Not on file    Relationship status: Not on file  Other Topics Concern   Not on file  Social History Narrative   Not on file    Additional social history is notable in that she was born in [place].  She is married and has 2 children ages 31 and 44.  Family History:  The patient's family history includes Anemia in her father; Asthma in her sister; Heart disease in an other family member; Hypertension in her mother; Leukemia in her brother; Lupus in her child; OCD in her sister; Osteoarthritis in her mother and sister; Osteoporosis in her mother.  Both parents are deceased, father with significant immediate anemia and died in his 62s.  Her mother died at 24.  She has 2 brothers and 1  sister.  ROS General: Negative; No fevers, chills, or night sweats;  HEENT: Negative; No changes in vision or hearing, sinus congestion, difficulty swallowing Pulmonary: Negative; No cough, wheezing, shortness of breath, hemoptysis Cardiovascular: History of a flutter/A. fib, status post ablation January 01, 2018; repeat attempt at ablation October 2020 unsuccessful due to equipment failure GI: Negative; No nausea, vomiting, diarrhea, or abdominal pain GU: Negative; No dysuria, hematuria, or difficulty voiding Musculoskeletal: Negative; no myalgias, joint pain, or weakness Hematologic/Oncology: Negative; no easy bruising, bleeding Endocrine: Negative; no heat/cold intolerance; no diabetes Neuro: Negative; no changes in balance, headaches Skin: Negative; No rashes or skin lesions Psychiatric: Negative; No behavioral problems, depression Sleep: See HPI, previous snoring, daytime fatigue, apnea, frequent nocturia ;  No bruxism, restless legs, hypnogognic hallucinations, no cataplexy Other comprehensive 14 point system review is negative.   PHYSICAL EXAM:   VS:  BP (!) 142/76    Pulse 78    Temp (!) 96.4 F (35.8 C)    Ht 5' 7"  (1.702 m)    Wt 192 lb 6.4 oz (87.3 kg)    SpO2 95%    BMI 30.13 kg/m    Repeat blood pressure by me was 136/70  Wt Readings from Last 3 Encounters:  12/10/18 195 lb 6.4 oz (88.6 kg)  12/18/18 192 lb 6.4 oz (87.3 kg)  12/18/18 194 lb (88 kg)    General: Alert, oriented, no distress.  Skin: normal turgor, no rashes, warm and dry HEENT: Normocephalic, atraumatic. Pupils equal round and reactive to light; sclera anicteric; extraocular muscles intact; Fundi ** Nose without nasal septal hypertrophy Mouth/Parynx benign; Mallinpatti scale 3 Neck: No JVD, no carotid bruits; normal carotid upstroke Lungs: clear to ausculatation and percussion; no wheezing or rales Chest wall: without tenderness to palpitation Heart: PMI not displaced, irregularly irregular with  tachycardia, s1 s2 normal, 1/6 systolic murmur, no diastolic murmur, no rubs, gallops, thrills, or heaves Abdomen: soft, nontender; no hepatosplenomehaly, BS+; abdominal aorta nontender and  not dilated by palpation. Back: no CVA tenderness Pulses 2+ Musculoskeletal: full range of motion, normal strength, no joint deformities Extremities: Improvement in prior 2+ edema, but still edema is present and is 1+ pretibially no clubbing cyanosis or edema, Homan's sign negative  Neurologic: grossly nonfocal; Cranial nerves grossly wnl Psychologic: Normal mood and affect   Studies/Labs Reviewed:   EKG:  EKG is ordered today.  ECG (independently read by me): Atrial fibrillation with rapid ventricular response with heart rate ranging from approximately 75 to 150 bpm; averaging in the 110 - 120 range  ECG (independently read by me): Sinus bradycardia at 55 bpm.  Borderline first-degree block with appeared of low 206 ms.  No ectopy.  Normal intervals.  Recent Labs: BMP Latest Ref Rng & Units 12/18/2018 10/28/2018 10/25/2018  Glucose 65 - 99 mg/dL 191(H) 295(H) 327(H)  BUN 8 - 27 mg/dL 32(H) 26 24(H)  Creatinine 0.57 - 1.00 mg/dL 1.34(H) 1.26(H) 1.27(H)  BUN/Creat Ratio 12 - 28 24 21  -  Sodium 134 - 144 mmol/L 139 140 137  Potassium 3.5 - 5.2 mmol/L 4.7 4.5 4.4  Chloride 96 - 106 mmol/L 102 103 105  CO2 20 - 29 mmol/L 23 24 24   Calcium 8.7 - 10.3 mg/dL 9.9 9.6 9.2     Hepatic Function Latest Ref Rng & Units 12/18/2018 05/22/2017 04/20/2017  Total Protein 6.0 - 8.5 g/dL 7.1 6.3(L) 5.9(L)  Albumin 3.8 - 4.8 g/dL 4.5 3.8 3.4(L)  AST 0 - 40 IU/L 27 39 67(H)  ALT 0 - 32 IU/L 20 32 80(H)  Alk Phosphatase 39 - 117 IU/L 192(H) 184(H) 263(H)  Total Bilirubin 0.0 - 1.2 mg/dL 0.4 0.7 1.1    CBC Latest Ref Rng & Units 12/18/2018 10/28/2018 04/10/2018  WBC 3.4 - 10.8 x10E3/uL 5.5 5.2 -  Hemoglobin 11.1 - 15.9 g/dL 12.3 11.6 12.9  Hematocrit 34.0 - 46.6 % 38.3 35.2 38.0  Platelets 150 - 450 x10E3/uL 218 167 -    Lab Results  Component Value Date   MCV 96 12/18/2018   MCV 93 10/28/2018   MCV 98.3 03/19/2018   Lab Results  Component Value Date   TSH 0.831 12/18/2018   No results found for: HGBA1C   BNP No results found for: BNP  ProBNP No results found for: PROBNP   Lipid Panel  No results found for: CHOL, TRIG, HDL, CHOLHDL, VLDL, LDLCALC, LDLDIRECT   RADIOLOGY: No results found.   Additional studies/ records that were reviewed today include:  I reviewed the patient's records from Dr. Oval Linsey, atrial fibrillation clinic, Dr. Rayann Heman,  ablation, sleep study, and obtained a subsequent download.   ASSESSMENT:    1. OSA (obstructive sleep apnea)   2. Paroxysmal atrial fibrillation (HCC)   3. Essential hypertension   4. 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 Hypothyroidism, unspecified type   7. Pure hypertriglyceridemia     PLAN:  Ms. Miyana Mordecai is a pleasant 68 year-old female who has significant cardiovascular comorbidities including hypertension, hyperlipidemia, palpitations, atrial flutter and atrial fibrillation as well as type I diabetes mellitus on an insulin pump.   Due to concerns for obstructive sleep apnea a sleep study confirmed the presence of severe sleep apnea with an AHI of 43.9/h.  AHI during REM sleep was more pronounced with an AHI of 68.6/h.  She had significant oxygen desaturation to a nadir of 79%.  CPAP was implemented in October 2019.  In the past,  I discussed with her at length the association of severe untreated sleep apnea with nocturnal hypoxia and contribution to nocturnal arrhythmias.  Presently, she has been 100% compliant with continued CPAP use as evidenced by her most recent download of November 17, 2018 through December 16, 2018.  She is sleeping 8 hours and 31 minutes with CPAP therapy.  At her 9 cm water pressure, AHI continues to be excellent at 1.1/h.  There is no fast me.  She denies any  of her previous significant nocturia.  She is unaware of breakthrough snoring.  She denies weakness.  She denies excessive sleepiness.  She had undergone an initial ablation for PAF and isthmus dependent right atrial flutter in December 2019.  She has had recurrent episodes of atrial arrhythmia since and another attempt at ablation was made in October 2020 but unfortunately this could not be done due to equipment failure.  She had undergone a CT on October 25, 2018 which revealed severe biatrial enlargement with moderate mitral annular calcification, and dilation of her pulmonic trunk concerning for possible pulmonary hypertension.  On exam today she is back in atrial fibrillation with a rapid response with ventricular rate ranging in the 110-120 range but increasing to as high as 150 bpm.  She has been on losartan 50 mg and Eliquis but has not been on beta-blocker therapy.  With her AF with RVR I have suggested resumption of metoprolol 12.5 mg twice a day.  I have recommended she undergo 2D echo Doppler study for further evaluation of her enlarged pulmonary trunk and assessment of pulmonary hypertension.  I have recommended comprehensive metabolic panel, magnesium level, TSH and CBC be obtained.  She will follow-up with Dr. Rayann Heman in light of her recurrent AF as well as Dr. Oval Linsey.  I will see her in 1 year for follow-up sleep evaluation or sooner as needed.  Time spent: 30 minutes  Medication Adjustments/Labs and Tests Ordered: Current medicines are reviewed at length with the patient today.  Concerns regarding medicines are outlined above.  Medication changes, Labs and Tests ordered today are listed in the Patient Instructions below. Patient Instructions  Medication Instructions:  Your physician has recommended you make the following change in your medication:  RESTART YOUR METOPROLOL TARTRATE AT 12.5 MG BY MOUTH TWICE A DAY  *If you need a refill on your cardiac medications before your next  appointment, please call your pharmacy*  Lab Work: Your physician recommends that you return for lab work TODAY: CBC, CMP, TSH, MAGNESIUM  If you have labs (blood work) drawn today and your tests are completely normal, you will receive your results only by:  [place] (if you have MyChart) OR  A paper copy in the mail If you have any lab test that is abnormal or we need to change your treatment, we will call you to review the results.  Testing/Procedures: Your physician has requested that you have an echocardiogram. Echocardiography is a painless test that uses sound waves to create images of your heart. It provides your doctor with information about the size and shape of your heart and how well your hearts chambers and valves are working. This procedure takes approximately one hour. There are no restrictions for this procedure. LOCATION: [place] [facility]: [place], [place], [place] [address]     Follow-Up: At [facility], you and your health needs are our priority.  As part of our continuing mission to provide you with exceptional  heart care, we have created designated Provider Care Teams.  These Care Teams include your primary Cardiologist (physician) and Advanced Practice Providers (APPs -  Physician Assistants and Nurse Practitioners) who all work together to provide you with the care you need, when you need it.  Your next appointment:   12 month(s)  The format for your next appointment:   In Person  Provider:   Shelva Majestic, MD  Other Instructions  SCHEDULE A FOLLOW APPOINTMENT WITH DR. ALLRED  FOLLOW UP WITH DR. Dennison IN 3 MONTHS    Signed, Shelva Majestic, MD  12/21/2018 12:52 PM    [facility] [address] [place], [place], [place]  [address] Phone: [phone]

## 2018-12-19 LAB — MAGNESIUM: Magnesium: 1.9 mg/dL (ref 1.6–2.3)

## 2018-12-19 LAB — CBC
Hematocrit: 38.3 % (ref 34.0–46.6)
Hemoglobin: 12.3 g/dL (ref 11.1–15.9)
MCH: 30.8 pg (ref 26.6–33.0)
MCHC: 32.1 g/dL (ref 31.5–35.7)
MCV: 96 fL (ref 79–97)
Platelets: 218 10*3/uL (ref 150–450)
RBC: 4 x10E6/uL (ref 3.77–5.28)
RDW: 12.4 % (ref 11.7–15.4)
WBC: 5.5 10*3/uL (ref 3.4–10.8)

## 2018-12-19 LAB — COMPREHENSIVE METABOLIC PANEL
ALT: 20 IU/L (ref 0–32)
AST: 27 IU/L (ref 0–40)
Albumin/Globulin Ratio: 1.7 (ref 1.2–2.2)
Albumin: 4.5 g/dL (ref 3.8–4.8)
Alkaline Phosphatase: 192 IU/L — ABNORMAL HIGH (ref 39–117)
BUN/Creatinine Ratio: 24 (ref 12–28)
BUN: 32 mg/dL — ABNORMAL HIGH (ref 8–27)
Bilirubin Total: 0.4 mg/dL (ref 0.0–1.2)
CO2: 23 mmol/L (ref 20–29)
Calcium: 9.9 mg/dL (ref 8.7–10.3)
Chloride: 102 mmol/L (ref 96–106)
Creatinine, Ser: 1.34 mg/dL — ABNORMAL HIGH (ref 0.57–1.00)
GFR calc Af Amer: 47 mL/min/{1.73_m2} — ABNORMAL LOW (ref >59)
GFR calc non Af Amer: 40 mL/min/{1.73_m2} — ABNORMAL LOW (ref >59)
Globulin, Total: 2.6 g/dL (ref 1.5–4.5)
Glucose: 191 mg/dL — ABNORMAL HIGH (ref 65–99)
Potassium: 4.7 mmol/L (ref 3.5–5.2)
Sodium: 139 mmol/L (ref 134–144)
Total Protein: 7.1 g/dL (ref 6.0–8.5)

## 2018-12-19 LAB — TSH: TSH: 0.831 u[IU]/mL (ref 0.450–4.500)

## 2018-12-21 ENCOUNTER — Encounter: Payer: Self-pay | Admitting: Cardiovascular Disease

## 2018-12-24 NOTE — Progress Notes (Signed)
[facility] PREP Weekly Session   Patient Details  Name: DINNA MCCALVIN MRN: [id number] Date of Birth: 1950/06/30 Age: 68 y.o. PCP: Jonathon Jordan, MD  There were no vitals filed for this visit.  [facility] Weekly seesion - 12/24/18 1800      Weekly Session   Minutes exercised this week  15 minutes   No Cardio reported, 15 min strength, no flexibility reported     Things you are grateful for: Pretty weather Nutrition celebrations for the week: skipping snacks Barriers/struggles: heart flutter and lack of energy  Barnett Hatter 12/24/2018, 6:40 PM

## 2019-01-01 ENCOUNTER — Ambulatory Visit (HOSPITAL_COMMUNITY): Payer: Medicare Other | Attending: Internal Medicine

## 2019-01-01 ENCOUNTER — Other Ambulatory Visit: Payer: Self-pay

## 2019-01-01 DIAGNOSIS — I480 Paroxysmal atrial fibrillation: Secondary | ICD-10-CM | POA: Diagnosis not present

## 2019-01-07 NOTE — Progress Notes (Signed)
[facility] PREP Weekly Session   Patient Details  Name: [name] MRN: [id number] Date of Birth: 1950/02/17 Age: 68 y.o. PCP: Jonathon Jordan, MD  Vitals:   01/07/19 1828  Weight: 194 lb 9.6 oz (88.3 kg)    [facility] Weekly seesion - 01/07/19 1800      Weekly Session   Topic Discussed  Stress management and problem solving    Minutes exercised this week  20 minutes   10 cardio and 10 of strength training, (under reported)      Fun things you did since last meeting: 2 Zoom Christmas parties Things you are grateful for: Friends Barriers: high blood sugars   Barnett Hatter 01/07/2019, 6:30 PM

## 2019-01-15 NOTE — Progress Notes (Signed)
[facility] PREP Weekly Session   Patient Details  Name: Courtney Pearson MRN: [id number] Date of Birth: 09-02-1950 Age: 68 y.o. PCP: Jonathon Jordan, MD  Vitals:   01/14/19 1430  Weight: 194 lb (88 kg)    [facility] Weekly seesion - 01/15/19 0700      Weekly Session   Topic Discussed  --   half cardio/half strength training    Minutes exercised this week  0 minutes   none documented, under reported     Fun things you did since last meeting: upper body exercises with weights    Barnett Hatter 01/15/2019, 7:13 AM

## 2019-02-08 ENCOUNTER — Other Ambulatory Visit (HOSPITAL_COMMUNITY): Payer: Self-pay | Admitting: Nurse Practitioner

## 2019-02-12 ENCOUNTER — Telehealth (INDEPENDENT_AMBULATORY_CARE_PROVIDER_SITE_OTHER): Payer: Medicare Other | Admitting: Internal Medicine

## 2019-02-12 ENCOUNTER — Encounter: Payer: Self-pay | Admitting: Internal Medicine

## 2019-02-12 ENCOUNTER — Telehealth: Payer: Self-pay

## 2019-02-12 VITALS — BP 109/75 | HR 92 | Ht 67.0 in | Wt 195.0 lb

## 2019-02-12 DIAGNOSIS — I10 Essential (primary) hypertension: Secondary | ICD-10-CM | POA: Diagnosis not present

## 2019-02-12 DIAGNOSIS — I4819 Other persistent atrial fibrillation: Secondary | ICD-10-CM

## 2019-02-12 DIAGNOSIS — G4733 Obstructive sleep apnea (adult) (pediatric): Secondary | ICD-10-CM | POA: Diagnosis not present

## 2019-02-12 DIAGNOSIS — D6869 Other thrombophilia: Secondary | ICD-10-CM | POA: Diagnosis not present

## 2019-02-12 NOTE — Telephone Encounter (Signed)
-----   Message from Thompson Grayer, MD sent at 02/12/2019  8:55 AM EST ----- Sonia Baller,  please refer Ms Meinhardt to Dr Orvan Seen for consideration of surgical treatment for afib

## 2019-02-12 NOTE — Progress Notes (Signed)
Electrophysiology TeleHealth Note   Due to national recommendations of social distancing due to COVID 19, an audio/video telehealth visit is felt to be most appropriate for this patient at this time.  See MyChart message from today for the patient's consent to telehealth for [facility].   Date:  02/12/2019   ID:  Courtney Pearson, DOB 1950/09/24, MRN [id number]  Location: patient's home  Provider location:  [facility]  Evaluation Performed: Follow-up visit  PCP:  Jonathon Jordan, MD   Electrophysiologist:  Dr Rayann Heman  Chief Complaint:  palpitations  History of Present Illness:    Courtney Pearson is a 69 y.o. female who presents via telehealth conferencing today.  Since last being seen in our clinic, the patient reports doing reasonably well.  She continues to have frequent afib.  She has reduced energy and exercise tolerance.  Stairs and incline are difficult.  Today, she denies symptoms of chest pain,  lower extremity edema, dizziness, presyncope, or syncope.  The patient is otherwise without complaint today.  The patient denies symptoms of fevers, chills, cough, or new SOB worrisome for COVID 19.  Past Medical History:  Diagnosis Date  . Diabetes ([place])   . HTN (hypertension)   . Hyperlipidemia   . Hypothyroidism   . Palpitation   . Persistent atrial fibrillation ([place]) 04/03/2017  . Renal insufficiency     Past Surgical History:  Procedure Laterality Date  . ATRIAL FIBRILLATION ABLATION N/A 01/01/2018   Procedure: ATRIAL FIBRILLATION ABLATION;  Surgeon: Thompson Grayer, MD;  Location: [place] CV LAB;  Service: Cardiovascular;  Laterality: N/A;  . ATRIAL FIBRILLATION ABLATION N/A 10/31/2018   Procedure: ATRIAL FIBRILLATION ABLATION;  Surgeon: Thompson Grayer, MD;  Location: [place] CV LAB;  Service: Cardiovascular;  Laterality: N/A;  . BREAST BIOPSY    . CARDIOVERSION N/A 04/30/2017   Procedure: CARDIOVERSION;  Surgeon: [name], MD;  Location: [facility]  ENDOSCOPY;  Service: Cardiovascular;  Laterality: N/A;  . CARDIOVERSION N/A 02/15/2018   Procedure: CARDIOVERSION;  Surgeon: [name], MD;  Location: [facility] ENDOSCOPY;  Service: Cardiovascular;  Laterality: N/A;  . CARDIOVERSION N/A 04/10/2018   Procedure: CARDIOVERSION;  Surgeon: Josue Hector, MD;  Location: [place];  Service: Cardiovascular;  Laterality: N/A;  . CESAREAN SECTION    . EYE SURGERY      Current Outpatient Medications  Medication Sig Dispense Refill  . acetaminophen (TYLENOL) 500 MG tablet Take 500 mg by mouth daily as needed for moderate pain or headache.     Marland Kitchen amoxicillin (AMOXIL) 500 MG capsule Take 4 capsules by mouth as needed. 1 hour prior to dental appointments    . atorvastatin (LIPITOR) 10 MG tablet Take 10 mg by mouth every other day.     . bismuth subsalicylate (PEPTO BISMOL) 262 MG/15ML suspension Take 30 mLs by mouth every 6 (six) hours as needed for indigestion.    . calcium carbonate (TUMS EX) 750 MG chewable tablet Chew 750 mg by mouth 2 (two) times daily.     . Cholecalciferol (VITAMIN D-3) 25 MCG (1000 UT) CAPS Take 1,000 Units by mouth daily.    Marland Kitchen ELIQUIS 5 MG TABS tablet TAKE 1 TABLET TWICE DAILY 180 tablet 2  . fexofenadine (ALLEGRA) 180 MG tablet Take 180 mg by mouth daily as needed for allergies or rhinitis.    . furosemide (LASIX) 20 MG tablet Take 1 tablet (20 mg total) by mouth daily as needed for fluid. 30 tablet 3  . hydrocortisone cream 1 %  Apply 1 application topically daily as needed for itching.    . insulin aspart (NOVOLOG) 100 UNIT/ML injection Inject 25 Units into the skin daily. Via insulin pump    . levothyroxine (SYNTHROID) 88 MCG tablet     . loperamide (IMODIUM A-D) 2 MG tablet Take 2-4 mg by mouth as needed for diarrhea or loose stools.     Marland Kitchen losartan (COZAAR) 50 MG tablet Take 1 tablet (50 mg total) by mouth daily. 90 tablet 1  . metoprolol tartrate (LOPRESSOR) 25 MG tablet Take 0.5 tablets (12.5 mg total) by mouth 2 (two)  times daily. 90 tablet 3  . Multiple Vitamins-Minerals (MULTIVITAMIN ADULT PO) Take 1 tablet by mouth daily.    . potassium chloride (K-DUR) 10 MEQ tablet TAKE 1 TABLET BY MOUTH EVERY DAY 90 tablet 1   No current facility-administered medications for this visit.    Allergies:   Patient has no known allergies.   Social History:  The patient  reports that she has never smoked. She has never used smokeless tobacco. She reports that she does not drink alcohol.   Family History:  The patient's  family history includes Anemia in her father; Asthma in her sister; Heart disease in an other family member; Hypertension in her mother; Leukemia in her brother; Lupus in her child; OCD in her sister; Osteoarthritis in her mother and sister; Osteoporosis in her mother.   ROS:  Please see the history of present illness.   All other systems are personally reviewed and negative.    Exam:    Vital Signs:  BP 109/75   Pulse 92   Ht 5\' 7"  (1.702 m)   Wt 195 lb (88.5 kg)   BMI 30.54 kg/m   Well sounding and appearing, alert and conversant, regular work of breathing,  good skin color Eyes- anicteric, neuro- grossly intact, skin- no apparent rash or lesions or cyanosis, mouth- oral mucosa is pink  Labs/Other Tests and Data Reviewed:    Recent Labs: 12/18/2018: ALT 20; BUN 32; Creatinine, Ser 1.34; Hemoglobin 12.3; Magnesium 1.9; Platelets 218; Potassium 4.7; Sodium 139; TSH 0.831   Wt Readings from Last 3 Encounters:  02/12/19 195 lb (88.5 kg)  01/14/19 194 lb (88 kg)  01/07/19 194 lb 9.6 oz (88.3 kg)    ASSESSMENT & PLAN:    1.  Persistent afib Previously paroxysmal but appears to have progressed to persistent by symptoms She is s/p afib ablation by me 12/19.  She has failed medical therapy.   She has severe biatrial enlargement by cardiac CT 10/25/2018 though her echoes reflect a smaller atrial size. I suspect that her atrial size is underestimated on echo. We discussed treatment options at  length today.  I would like for her to discuss surgical options for afib with Dr Orvan Seen.  I will therefore refer her to him for further conversations. Continue eliquis for chads2vascs core of at least 4.  She may benefit from atrial clip at time of her ablation if she has a surgical procedure  2 HTN Stable No change required today  3. OSA/ pulmonary hypertension Followed by Dr Claiborne Billings  Follow-up:  3 months with me   Patient Risk:  after full review of this patients clinical status, I feel that they are at moderate risk at this time.  Today, I have spent 15 minutes with the patient with telehealth technology discussing arrhythmia management .    Signed, Thompson Grayer, MD  02/12/2019 8:41 AM     [facility]  [address] [place] [address] [phone] (office) [phone] (fax)

## 2019-02-21 ENCOUNTER — Ambulatory Visit: Payer: Medicare Other

## 2019-02-23 ENCOUNTER — Other Ambulatory Visit (HOSPITAL_COMMUNITY): Payer: Self-pay | Admitting: Nurse Practitioner

## 2019-02-24 ENCOUNTER — Other Ambulatory Visit: Payer: Self-pay

## 2019-02-24 ENCOUNTER — Institutional Professional Consult (permissible substitution) (INDEPENDENT_AMBULATORY_CARE_PROVIDER_SITE_OTHER): Payer: Medicare Other | Admitting: Cardiothoracic Surgery

## 2019-02-24 VITALS — BP 158/78 | HR 68 | Resp 20 | Ht 67.0 in | Wt 195.0 lb

## 2019-02-24 DIAGNOSIS — I4819 Other persistent atrial fibrillation: Secondary | ICD-10-CM | POA: Diagnosis not present

## 2019-02-24 NOTE — Progress Notes (Signed)
[address]       [place] [address]             [place] REPORT  Referring Provider is Thompson Grayer, MD Primary Cardiologist is [name], MD PCP is Jonathon Jordan, MD  Chief Complaint  Patient presents with  . Atrial Fibrillation    Surgical eval, ECHO 01/01/19, Cardiac CT 10/25/18    HPI:  69 yo lady with PMHx of HTN, thyroid disease, and hyperlipidemia 1st experienced tachyarrhythmia approximately 2 years ago. She has undergone several DC cardioversions and an endocardial ablation by Dr. Rayann Heman in 12/19. She has generally tolerated medication regimen for persistent afib but has had to stop beta blocker due to bradycardia and never started flecainide or amiodarone in earnest. She seems to be tolerating anticoagulation well; denies h/o CVA/TIA.  Her sx are primarily fatigue and exercise intolerance, but it is hard for her to imagine being more active and vibrant without symptoms. She does use CPAP at night, but is otherwise without lung sx.   Past Medical History:  Diagnosis Date  . Diabetes ([place])   . HTN (hypertension)   . Hyperlipidemia   . Hypothyroidism   . Palpitation   . Persistent atrial fibrillation ([place]) 04/03/2017  . Renal insufficiency     Past Surgical History:  Procedure Laterality Date  . ATRIAL FIBRILLATION ABLATION N/A 01/01/2018   Procedure: ATRIAL FIBRILLATION ABLATION;  Surgeon: Thompson Grayer, MD;  Location: [place] CV LAB;  Service: Cardiovascular;  Laterality: N/A;  . ATRIAL FIBRILLATION ABLATION N/A 10/31/2018   Procedure: ATRIAL FIBRILLATION ABLATION;  Surgeon: Thompson Grayer, MD;  Location: [place] CV LAB;  Service: Cardiovascular;  Laterality: N/A;  . BREAST BIOPSY    . CARDIOVERSION N/A 04/30/2017   Procedure: CARDIOVERSION;  Surgeon: [name], MD;  Location: [facility] ENDOSCOPY;  Service: Cardiovascular;  Laterality: N/A;  . CARDIOVERSION N/A 02/15/2018   Procedure: CARDIOVERSION;  Surgeon: [name], MD;  Location: [facility] ENDOSCOPY;  Service: Cardiovascular;  Laterality: N/A;  . CARDIOVERSION N/A 04/10/2018   Procedure: CARDIOVERSION;  Surgeon: Josue Hector, MD;  Location: [place];  Service: Cardiovascular;  Laterality: N/A;  . CESAREAN SECTION    . EYE SURGERY      Family History  Problem Relation Age of Onset  . Heart disease Other        No family history  . Hypertension Mother   . Osteoarthritis Mother   . Osteoporosis Mother   . Anemia Father   . Asthma Sister   . Osteoarthritis Sister   . OCD Sister   . Leukemia Brother   . Lupus Child     Social History   Socioeconomic History  . Marital status: Married    Spouse name: Not on file  . Number of children: 2  . Years of education: Not on file  . Highest education level: Not on file  Occupational History  . Not on file  Tobacco Use  . Smoking status: Never Smoker  . Smokeless tobacco: Never Used  Substance and Sexual Activity  . Alcohol use: No  . Drug use: Not on file  . Sexual activity: Not on file  Other Topics Concern  . Not on file  Social History Narrative  . Not on file   Social Determinants of Health   Financial Resource Strain:   . Difficulty of Paying Living Expenses: Not on file  Food Insecurity:   .  Worried About Charity fundraiser in the Last Year: Not on file  . Ran Out of Food in the Last Year: Not on file  Transportation Needs:   . Lack of Transportation (Medical): Not on file  . Lack of Transportation (Non-Medical): Not on file  Physical Activity:   . Days of Exercise per Week: Not on file  . Minutes of Exercise per Session: Not on file  Stress:   . Feeling of Stress : Not on file  Social Connections:   . Frequency of Communication with Friends and Family: Not on file  . Frequency of Social Gatherings with Friends and Family: Not on file  . Attends Religious Services: Not on file  . Active Member of Clubs or Organizations:  Not on file  . Attends Archivist Meetings: Not on file  . Marital Status: Not on file  Intimate Partner Violence:   . Fear of Current or Ex-Partner: Not on file  . Emotionally Abused: Not on file  . Physically Abused: Not on file  . Sexually Abused: Not on file    Current Outpatient Medications  Medication Sig Dispense Refill  . acetaminophen (TYLENOL) 500 MG tablet Take 500 mg by mouth daily as needed for moderate pain or headache.     Marland Kitchen amoxicillin (AMOXIL) 500 MG capsule Take 4 capsules by mouth as needed. 1 hour prior to dental appointments    . atorvastatin (LIPITOR) 10 MG tablet Take 10 mg by mouth every other day.     . bismuth subsalicylate (PEPTO BISMOL) 262 MG/15ML suspension Take 30 mLs by mouth every 6 (six) hours as needed for indigestion.    . calcium carbonate (TUMS EX) 750 MG chewable tablet Chew 750 mg by mouth 2 (two) times daily.     . Cholecalciferol (VITAMIN D-3) 25 MCG (1000 UT) CAPS Take 1,000 Units by mouth daily.    Marland Kitchen ELIQUIS 5 MG TABS tablet TAKE 1 TABLET TWICE DAILY 180 tablet 2  . fexofenadine (ALLEGRA) 180 MG tablet Take 180 mg by mouth daily as needed for allergies or rhinitis.    . furosemide (LASIX) 20 MG tablet TAKE 1 TABLET (20 MG TOTAL) BY MOUTH DAILY AS NEEDED FOR FLUID. 90 tablet 1  . hydrocortisone cream 1 % Apply 1 application topically daily as needed for itching.    . insulin aspart (NOVOLOG) 100 UNIT/ML injection Inject 25 Units into the skin daily. Via insulin pump    . levothyroxine (SYNTHROID) 88 MCG tablet     . loperamide (IMODIUM A-D) 2 MG tablet Take 2-4 mg by mouth as needed for diarrhea or loose stools.     Marland Kitchen losartan (COZAAR) 50 MG tablet Take 1 tablet (50 mg total) by mouth daily. 90 tablet 1  . metoprolol tartrate (LOPRESSOR) 25 MG tablet Take 0.5 tablets (12.5 mg total) by mouth 2 (two) times daily. 90 tablet 3  . Multiple Vitamins-Minerals (MULTIVITAMIN ADULT PO) Take 1 tablet by mouth daily.    . potassium chloride  (K-DUR) 10 MEQ tablet TAKE 1 TABLET BY MOUTH EVERY DAY 90 tablet 1   No current facility-administered medications for this visit.    Allergies  Allergen Reactions  . 5-Alpha Reductase Inhibitors       Review of Systems:   General:  Good appetite, reduced energy,+5 lb weight gain,   Cardiac:  No chest pain; + SOB with mild-mod exertion, no resting SOB,+palpitations/atrial fibrillation, denies dizzy spells/syncope  Respiratory:  No shortness of breath,no home oxygen, denies asthma,+  CPAP at night for approximately 2 years  GI:   intermittent difficulty swallowing,   GU:   + mild kidney disease  Vascular:  negative  Neuro:   No stroke/ TIA's, + peripheral neuropathy,    Musculoskeletal: + arthritis,    Skin:   negative  Psych:   denies anxiety/ depression, no nervousness, no unusual recent stress  Eyes:   +  floaters, does  Wear glasses   ENT:   + hearing loss, last saw dentist 2020  Hematologic:  *denies  Endocrine:  + diabetes--Type I, d     Physical Exam:   BP (!) 158/78   Pulse 68   Resp 20   Ht 5\' 7"  (1.702 m)   Wt 88.5 kg   SpO2 98% Comment: RA  BMI 30.54 kg/m   General:  Pleasant, well-appearing, nad  HEENT:  Unremarkable   Neck:   no JVD, no bruits, no adenopathy   Chest:   clear to auscultation, symmetrical breath sounds, no wheezes, no rhonchi   CV:   Irregularly irregular, no murmur   Abdomen:  soft, non-tender, no masses   Extremities:  warm, well-perfused, pulses intact throughout, no LE edema  Neuro:   Grossly non-focal and symmetrical throughout  Skin:   Clean and dry, no rashes, no breakdown   Diagnostic Tests:  I have reviewed available imaging and agree with interpretations.   Impression:  69 yo with persistent afib s/p one episode of endocardial ablation and several cardioversions. She is a good candidate for any number of surgical ablation techniques, but the Convergent procedure and left atrial appendage clipping may be the best trade-off  since she's been endocardially ablated x 1. I have discussed with her various options for treating afib including another attempt at endocardial ablation, Convergent procedure, bilateral trans-thoracic ablation, and right-chest approach to Cox-Maze IV. I have emphasized that a combined approach is much more likely to successfully resolve afib.   Plan:  I have discussed the options for treating atrial fibrillation with ablation in the attempt to restore NSR +/- medications. She will consider these options and f/u with Afib clinic or call us back with her preferred treatment plan.    I spent in excess of 52minutes during the conduct of this office consultation and >50% of this time involved direct face-to-face encounter with the patient for counseling and/or coordination of their care.          Level 3 Office Consult = 40 minutes         Level 4 Office Consult = 60 minutes         Level 5 Office Consult = 80 minutes  B. Murvin Natal, MD 02/24/2019 3:39 PM

## 2019-02-27 ENCOUNTER — Ambulatory Visit: Payer: Medicare Other | Attending: Internal Medicine

## 2019-02-27 DIAGNOSIS — Z23 Encounter for immunization: Secondary | ICD-10-CM | POA: Insufficient documentation

## 2019-02-27 NOTE — Progress Notes (Signed)
   Covid-19 Vaccination Clinic  Name:  Courtney Pearson    MRN: [id number] DOB: 08-Aug-1950  02/27/2019  Courtney Pearson was observed post Covid-19 immunization for 15 minutes without incidence. She was provided with Vaccine Information Sheet and instruction to access the V-Safe system.   Courtney Pearson was instructed to call 911 with any severe reactions post vaccine: Marland Kitchen Difficulty breathing  . Swelling of your face and throat  . A fast heartbeat  . A bad rash all over your body  . Dizziness and weakness    Immunizations Administered    Name Date Dose VIS Date Route   Pfizer COVID-19 Vaccine 02/27/2019  5:48 PM 0.3 mL 01/03/2019 Intramuscular   Manufacturer: [place]   Lot: [id number]   [place]: [id number]

## 2019-02-28 DIAGNOSIS — Z794 Long term (current) use of insulin: Secondary | ICD-10-CM | POA: Diagnosis not present

## 2019-02-28 DIAGNOSIS — E039 Hypothyroidism, unspecified: Secondary | ICD-10-CM | POA: Diagnosis not present

## 2019-02-28 DIAGNOSIS — E1042 Type 1 diabetes mellitus with diabetic polyneuropathy: Secondary | ICD-10-CM | POA: Diagnosis not present

## 2019-02-28 DIAGNOSIS — N1831 Chronic kidney disease, stage 3a: Secondary | ICD-10-CM | POA: Diagnosis not present

## 2019-02-28 DIAGNOSIS — E1022 Type 1 diabetes mellitus with diabetic chronic kidney disease: Secondary | ICD-10-CM | POA: Diagnosis not present

## 2019-02-28 DIAGNOSIS — E11319 Type 2 diabetes mellitus with unspecified diabetic retinopathy without macular edema: Secondary | ICD-10-CM | POA: Diagnosis not present

## 2019-02-28 DIAGNOSIS — E1165 Type 2 diabetes mellitus with hyperglycemia: Secondary | ICD-10-CM | POA: Diagnosis not present

## 2019-03-04 ENCOUNTER — Ambulatory Visit: Payer: Medicare Other

## 2019-03-07 NOTE — Progress Notes (Signed)
[place] Report   Patient Details  Name: Courtney Pearson MRN: [id number] Date of Birth: 09/15/1950 Age: 69 y.o. PCP: Jonathon Jordan, MD  Vitals:   03/06/19 1545  BP: 130/68  Pulse: 75  Weight: 201 lb 6.4 oz (91.4 kg)  Height: 5\' 7"  (1.702 m)     [facility] Eval - 03/07/19 1200      Measurement   Waist Circumference  36 inches    Hip Circumference  43.5 inches    Body fat  42.7 percent      Information for Trainer   Goals  Increase stamina, lower body strength      Mobility and Daily Activities   I find it easy to walk up or down two or more flights of stairs.  1    I have no trouble taking out the trash.  1    I do housework such as vacuuming and dusting on my own without difficulty.  2    I can easily lift a gallon of milk (8lbs).  2    I can easily walk a mile.  1    I have no trouble reaching into high cupboards or reaching down to pick up something from the floor.  2    I do not have trouble doing out-door work such as Armed forces logistics/support/administrative officer, raking leaves, or gardening.  1      Mobility and Daily Activities   I feel younger than my age.  1    I feel independent.  3    I feel energetic.  1    I live an active life.   2    I feel strong.  2    I feel healthy.  2    I feel active as other people my age.  2      How fit and strong are you.   Fit and Strong Total Score  23      Past Medical History:  Diagnosis Date  . Diabetes ([place])   . HTN (hypertension)   . Hyperlipidemia   . Hypothyroidism   . Palpitation   . Persistent atrial fibrillation ([place]) 04/03/2017  . Renal insufficiency    Past Surgical History:  Procedure Laterality Date  . ATRIAL FIBRILLATION ABLATION N/A 01/01/2018   Procedure: ATRIAL FIBRILLATION ABLATION;  Surgeon: Thompson Grayer, MD;  Location: [place] CV LAB;  Service: Cardiovascular;  Laterality: N/A;  . ATRIAL FIBRILLATION ABLATION N/A 10/31/2018   Procedure: ATRIAL FIBRILLATION ABLATION;  Surgeon: Thompson Grayer, MD;   Location: [place] CV LAB;  Service: Cardiovascular;  Laterality: N/A;  . BREAST BIOPSY    . CARDIOVERSION N/A 04/30/2017   Procedure: CARDIOVERSION;  Surgeon: [name], MD;  Location: [facility] ENDOSCOPY;  Service: Cardiovascular;  Laterality: N/A;  . CARDIOVERSION N/A 02/15/2018   Procedure: CARDIOVERSION;  Surgeon: [name], MD;  Location: [facility] ENDOSCOPY;  Service: Cardiovascular;  Laterality: N/A;  . CARDIOVERSION N/A 04/10/2018   Procedure: CARDIOVERSION;  Surgeon: Josue Hector, MD;  Location: [place];  Service: Cardiovascular;  Laterality: N/A;  . CESAREAN SECTION    . EYE SURGERY     Social History   Tobacco Use  Smoking Status Never Smoker  Smokeless Tobacco Never Used   Final Artist for PREP Reviewed prior testing, Likely misunderstood initial questions on How Fit and Strong you are--scoring improved by 1 when retested.  Improvements: Cardio march test  Sit to stand  Bicep curls  Encouraged continued exercise. Frustrated  with weight gain.Tolerating >20 min on seated elipitcal. Needs continued to work on balance.  Has plans for exercises and working thru obstacles of afib and balance.  Excellent attendance and engagement in program.     Kara Pacer [facility] 03/07/2019, 12:33 PM

## 2019-03-20 ENCOUNTER — Ambulatory Visit (INDEPENDENT_AMBULATORY_CARE_PROVIDER_SITE_OTHER): Payer: Medicare Other | Admitting: Cardiovascular Disease

## 2019-03-20 ENCOUNTER — Encounter: Payer: Self-pay | Admitting: Cardiovascular Disease

## 2019-03-20 ENCOUNTER — Other Ambulatory Visit: Payer: Self-pay

## 2019-03-20 VITALS — BP 146/81 | HR 65 | Ht 67.0 in | Wt 200.0 lb

## 2019-03-20 DIAGNOSIS — R0609 Other forms of dyspnea: Secondary | ICD-10-CM

## 2019-03-20 DIAGNOSIS — R0600 Dyspnea, unspecified: Secondary | ICD-10-CM

## 2019-03-20 DIAGNOSIS — E781 Pure hyperglyceridemia: Secondary | ICD-10-CM

## 2019-03-20 DIAGNOSIS — I2510 Atherosclerotic heart disease of native coronary artery without angina pectoris: Secondary | ICD-10-CM | POA: Diagnosis not present

## 2019-03-20 DIAGNOSIS — R0602 Shortness of breath: Secondary | ICD-10-CM | POA: Diagnosis not present

## 2019-03-20 DIAGNOSIS — I4819 Other persistent atrial fibrillation: Secondary | ICD-10-CM | POA: Diagnosis not present

## 2019-03-20 DIAGNOSIS — I480 Paroxysmal atrial fibrillation: Secondary | ICD-10-CM | POA: Diagnosis not present

## 2019-03-20 DIAGNOSIS — I10 Essential (primary) hypertension: Secondary | ICD-10-CM

## 2019-03-20 HISTORY — DX: Atherosclerotic heart disease of native coronary artery without angina pectoris: I25.10

## 2019-03-20 NOTE — Progress Notes (Addendum)
Cardiology Office Note   Date:  03/20/2019   ID:  Courtney Pearson, DOB March 21, 1950, MRN [id number]  PCP:  Jonathon Jordan, MD  Cardiologist:  Lilli Light Oval Linsey, MD, [facility]  Electrophysiologist: Dr. Rayann Heman  No chief complaint on file.    History of Present Illness: Courtney Pearson is a 69 y.o. female with persistent atrial fibrillation/flutter s/p ablation 12/2017 and DCCV, coronary calcification, hypertension, hyperlipidemia, and diabetes here for follow up.  Courtney Pearson saw her endocrinologist 03/2017 and was noted to be in atrial flutter with ventricular rates in the 120s-140s.  Propranolol was switched to metoprolol and she was started on Eliquis.  She subsequently followed up in atrial fibrillation clinic and had an echo 04/12/2017 that revealed LVEF 55 to 60% with mildly elevated pulmonary pressures.  She was noted to have a small pericardial effusion but no evidence of tamponade.  She underwent cardioversion 04/2017 but had recurrent atrial for relation.  She wore an event monitor 05/2017 that showed she was in atrial fibrillation 50% of the time.  She also had a 3.3-second postconversion pause.  She was subsequently started on flecainide.  She has also been on amiodarone and failed multiple cardioversion.  She underwent atrial fibrillation ablation 12/2017.  She had recurrent atrial fibrillation and DCCV that was unsuccessful.  She had a consultation with Dr. Orvan Seen and is considering surgical ablation.  She notices that she gets short of breath very easily.  She feels like she is dragging after she only walks 2 or 3 steps.  She has no exertional chest pain.  She denies lower extremity edema, orthopnea, or PND.  Prior to her ablation she had a preablation CT that had a calcium score 1712.   Past Medical History:  Diagnosis Date  . CAD in native artery 03/20/2019  . Diabetes ([place])   . HTN (hypertension)   . Hyperlipidemia   . Hypothyroidism   . Palpitation   . Persistent atrial  fibrillation ([place]) 04/03/2017  . Renal insufficiency     Past Surgical History:  Procedure Laterality Date  . ATRIAL FIBRILLATION ABLATION N/A 01/01/2018   Procedure: ATRIAL FIBRILLATION ABLATION;  Surgeon: Thompson Grayer, MD;  Location: [place] CV LAB;  Service: Cardiovascular;  Laterality: N/A;  . ATRIAL FIBRILLATION ABLATION N/A 10/31/2018   Procedure: ATRIAL FIBRILLATION ABLATION;  Surgeon: Thompson Grayer, MD;  Location: [place] CV LAB;  Service: Cardiovascular;  Laterality: N/A;  . BREAST BIOPSY    . CARDIOVERSION N/A 04/30/2017   Procedure: CARDIOVERSION;  Surgeon: [name], MD;  Location: [facility] ENDOSCOPY;  Service: Cardiovascular;  Laterality: N/A;  . CARDIOVERSION N/A 02/15/2018   Procedure: CARDIOVERSION;  Surgeon: [name], MD;  Location: [facility] ENDOSCOPY;  Service: Cardiovascular;  Laterality: N/A;  . CARDIOVERSION N/A 04/10/2018   Procedure: CARDIOVERSION;  Surgeon: Josue Hector, MD;  Location: [place];  Service: Cardiovascular;  Laterality: N/A;  . CESAREAN SECTION    . EYE SURGERY       Current Outpatient Medications  Medication Sig Dispense Refill  . acetaminophen (TYLENOL) 500 MG tablet Take 500 mg by mouth daily as needed for moderate pain or headache.     Marland Kitchen amoxicillin (AMOXIL) 500 MG capsule Take 4 capsules by mouth as needed. 1 hour prior to dental appointments    . atorvastatin (LIPITOR) 10 MG tablet Take 10 mg by mouth every other day.     . bismuth subsalicylate (PEPTO BISMOL) 262 MG/15ML suspension Take 30 mLs by mouth every 6 (six) hours  as needed for indigestion.    . calcium carbonate (TUMS EX) 750 MG chewable tablet Chew 750 mg by mouth 2 (two) times daily.     . Cholecalciferol (VITAMIN D-3) 25 MCG (1000 UT) CAPS Take 1,000 Units by mouth daily.    Marland Kitchen ELIQUIS 5 MG TABS tablet TAKE 1 TABLET TWICE DAILY 180 tablet 2  . fexofenadine (ALLEGRA) 180 MG tablet Take 180 mg by mouth daily as needed for allergies or rhinitis.    . furosemide  (LASIX) 20 MG tablet TAKE 1 TABLET (20 MG TOTAL) BY MOUTH DAILY AS NEEDED FOR FLUID. 90 tablet 1  . hydrocortisone cream 1 % Apply 1 application topically daily as needed for itching.    . insulin aspart (NOVOLOG) 100 UNIT/ML injection Inject 25 Units into the skin daily. Via insulin pump    . levothyroxine (SYNTHROID) 88 MCG tablet     . loperamide (IMODIUM A-D) 2 MG tablet Take 2-4 mg by mouth as needed for diarrhea or loose stools.     Marland Kitchen losartan (COZAAR) 50 MG tablet Take 1 tablet (50 mg total) by mouth daily. 90 tablet 1  . metoprolol tartrate (LOPRESSOR) 25 MG tablet Take 0.5 tablets (12.5 mg total) by mouth 2 (two) times daily. 90 tablet 3  . Multiple Vitamins-Minerals (MULTIVITAMIN ADULT PO) Take 1 tablet by mouth daily.    . potassium chloride (K-DUR) 10 MEQ tablet TAKE 1 TABLET BY MOUTH EVERY DAY 90 tablet 1   No current facility-administered medications for this visit.    Allergies:   5-alpha reductase inhibitors    Social History:  The patient  reports that she has never smoked. She has never used smokeless tobacco. She reports that she does not drink alcohol.   Family History:  The patient's family history includes Anemia in her father; Asthma in her sister; Heart disease in an other family member; Hypertension in her mother; Leukemia in her brother; Lupus in her child; OCD in her sister; Osteoarthritis in her mother and sister; Osteoporosis in her mother.    ROS:  Please see the history of present illness.   Otherwise, review of systems are positive for none.   All other systems are reviewed and negative.    PHYSICAL EXAM: VS:  BP (!) 146/81   Pulse 65   Ht 5\' 7"  (1.702 m)   Wt 200 lb (90.7 kg)   SpO2 97%   BMI 31.32 kg/m  , BMI Body mass index is 31.32 kg/m. GENERAL:  Well appearing HEENT: Pupils equal round and reactive, fundi not visualized, oral mucosa unremarkable NECK:  No jugular venous distention, waveform within normal limits, carotid upstroke brisk and  symmetric, no bruits LUNGS:  Clear to auscultation bilaterally HEART:  Irregularly irregular.  PMI not displaced or sustained,S1 and S2 within normal limits, no S3, no S4, no clicks, no rubs, no murmurs ABD:  Flat, positive bowel sounds normal in frequency in pitch, no bruits, no rebound, no guarding, no midline pulsatile mass, no hepatomegaly, no splenomegaly EXT:  2 plus pulses throughout, no edema, no cyanosis no clubbing SKIN:  No rashes no nodules NEURO:  Cranial nerves II through XII grossly intact, motor grossly intact throughout PSYCH:  Cognitively intact, oriented to person place and time   EKG:  EKG is ordered today. The ekg ordered today demonstrates atrial fibrillation.  Ventricular rate 120 bpm.  Echo 04/12/17: Study Conclusions  - Left ventricle: The cavity size was normal. Wall thickness was   normal. Systolic function was  normal. The estimated ejection   fraction was in the range of 55% to 60%. Wall motion was normal;   there were no regional wall motion abnormalities. The study is   not technically sufficient to allow evaluation of LV diastolic   function. - Aortic valve: Trileaflet; mildly thickened, mildly calcified   leaflets. - Mitral valve: Calcified annulus. Mildly thickened leaflets . - Pulmonary arteries: Systolic pressure was mildly to moderately   increased. PA peak pressure: 46 mm Hg (S). - Pericardium, extracardiac: A small pericardial effusion was   identified circumferential to the heart, mostly along the   posterior and right ventricular free wall. There was no evidence   of hemodynamic compromise.  14 Day Event Monitor 06/07/17:  Quality: Fair.  Baseline artifact. Predominant rhythm: atrial fibrillation 58%.  Rates up to 110 bpm. Average heart rate: 77 bpm 3.3 second post conversion pause noted. PACs and PVCs, atrial bigeminy noted   Pre-ablation cardiac CT 10/25/18: IMPRESSION: 1. Single left pulmonary vein with measurements as above.  Ostial diameter measurements noted to be similar to CTA dated 12/25/2017, however area measurements noted to be smaller on this study for RUPV and left common trunk.  2. There is no thrombus in the left atrial appendage.  3. The esophagus runs in close proximity to the RUPV ostium.  4. Severe biatrial enlargement.  5. 3 vessel calcified atherosclerosis. Coronary calcium score of 1712.  Recent Labs: 12/18/2018: ALT 20; BUN 32; Creatinine, Ser 1.34; Hemoglobin 12.3; Magnesium 1.9; Platelets 218; Potassium 4.7; Sodium 139; TSH 0.831   12/11/16: Sodium 140, potassium 4.4, BUN 26, creatinine 1.19 AST 22, ALT 18 Hemoglobin A1c 8.2% Total cholesterol 143, triglycerides 54, HDL 62, LDL 71   Lipid Panel No results found for: CHOL, TRIG, HDL, CHOLHDL, VLDL, LDLCALC, LDLDIRECT    Wt Readings from Last 3 Encounters:  03/20/19 200 lb (90.7 kg)  03/06/19 201 lb 6.4 oz (91.4 kg)  02/24/19 195 lb (88.5 kg)      ASSESSMENT AND PLAN:  # Persistent atrial fibrillation: Courtney Pearson and feeling poorly.  It seems as though it is certainly contributing to her exertional dyspnea.  However, it was noted that she had a coronary calcum score of 1700.  She had calcification in all 3 coronary distributions.  We will get a Lexiscan Myoview to ensure that ischemia is not contributing.  Rate is well-controlled.  Continue Eliquis and metoprolol.  She plans to follow-up with with Dr. Orvan Seen for consideration of surgical ablation.  # Coronary calcification:  # Hyperlipidemia:   Lexiscan Myoview as above.  Continue Eliquis and metoprolol.  Increase atorvastatin to 20 mg.  Repeat lipids and CMP in 2 months.  LDL goal is less than 70.  # Hypertension: Blood pressure is above goal today.  She reports that it is very well-controlled at home.  She is going to keep a log of her blood pressure and bring it to follow-up with our pharmacist in a month.  Continue losartan and metoprolol.   Current medicines are  reviewed at length with the patient today.  The patient does not have concerns regarding medicines.  The following changes have been made: none  Labs/ tests ordered today include:   Orders Placed This Encounter  Procedures  . MYOCARDIAL PERFUSION IMAGING     Disposition:   FU with Illeana Edick C. Oval Linsey, MD, [facility] in 6 months.  PharmD in 1 month.    Signed, Bostyn Kunkler C. Oval Linsey, MD, [facility]  03/20/2019 1:13 PM  [facility]

## 2019-03-20 NOTE — Patient Instructions (Signed)
Medication Instructions:  Your physician recommends that you continue on your current medications as directed. Please refer to the Current Medication list given to you today.  *If you need a refill on your cardiac medications before your next appointment, please call your pharmacy*  Lab Work: NONE   Testing/Procedures: Your physician has requested that you have a lexiscan myoview. For further information please visit [email or web address]. Please follow instruction sheet, as given.  Follow-Up: At [facility], you and your health needs are our priority.  As part of our continuing mission to provide you with exceptional heart care, we have created designated Provider Care Teams.  These Care Teams include your primary Cardiologist (physician) and Advanced Practice Providers (APPs -  Physician Assistants and Nurse Practitioners) who all work together to provide you with the care you need, when you need it.  Your next appointment:   3 month(s)  The format for your next appointment:   In Person  Provider:   You may see [name], MD or one of the following Advanced Practice Providers on your designated Care Team:    Kerin Ransom, PA-C  [place], [place]  Coletta Memos, [place]  Your physician recommends that you schedule a follow-up appointment in: 1 [place]   Other Instructions  MONITOR AND LOG YOUR BLOOD PRESSURE AND BRING TO YOUR FOLLOW UP

## 2019-03-25 ENCOUNTER — Ambulatory Visit: Payer: Medicare Other | Attending: Internal Medicine

## 2019-03-25 DIAGNOSIS — Z23 Encounter for immunization: Secondary | ICD-10-CM | POA: Insufficient documentation

## 2019-03-25 NOTE — Progress Notes (Signed)
   Covid-19 Vaccination Clinic  Name:  Courtney Pearson    MRN: [id number] DOB: [birth year]  03/25/2019  Ms. Seyer was observed post Covid-19 immunization for 15 minutes without incident. She was provided with Vaccine Information Sheet and instruction to access the V-Safe system.   Ms. Guzy was instructed to call 911 with any severe reactions post vaccine: Marland Kitchen Difficulty breathing  . Swelling of face and throat  . A fast heartbeat  . A bad rash all over body  . Dizziness and weakness   Immunizations Administered    Name Date Dose VIS Date Route   Pfizer COVID-19 Vaccine 03/25/2019  1:47 PM 0.3 mL 01/03/2019 Intramuscular   Manufacturer: [place]   Lot: [id number]   [place]: [id number]

## 2019-03-27 ENCOUNTER — Telehealth (HOSPITAL_COMMUNITY): Payer: Self-pay

## 2019-03-27 NOTE — Telephone Encounter (Signed)
Encounter complete. 

## 2019-04-01 ENCOUNTER — Other Ambulatory Visit: Payer: Self-pay

## 2019-04-01 ENCOUNTER — Ambulatory Visit (HOSPITAL_COMMUNITY)
Admission: RE | Admit: 2019-04-01 | Discharge: 2019-04-01 | Disposition: A | Discharge status: Home / Self Care | Payer: Medicare Other | Source: Ambulatory Visit | Attending: Internal Medicine | Admitting: Internal Medicine

## 2019-04-01 DIAGNOSIS — I480 Paroxysmal atrial fibrillation: Secondary | ICD-10-CM | POA: Insufficient documentation

## 2019-04-01 DIAGNOSIS — R0602 Shortness of breath: Secondary | ICD-10-CM

## 2019-04-01 LAB — MYOCARDIAL PERFUSION IMAGING
Peak HR: 115 {beats}/min
Rest HR: 91 {beats}/min
SDS: 0
SRS: 0
SSS: 0
TID: 1.03

## 2019-04-01 MED ORDER — TECHNETIUM TC 99M TETROFOSMIN IV KIT
31.8000 | PACK | Freq: Once | INTRAVENOUS | Status: AC | PRN
  Administered 2019-04-01: 31.8 via INTRAVENOUS
  Filled 2019-04-01: qty 32

## 2019-04-01 MED ORDER — AMINOPHYLLINE 25 MG/ML IV SOLN
75.0000 mg | Freq: Once | INTRAVENOUS | Status: AC
  Administered 2019-04-01: 75 mg via INTRAVENOUS

## 2019-04-01 MED ORDER — REGADENOSON 0.4 MG/5ML IV SOLN
0.4000 mg | Freq: Once | INTRAVENOUS | Status: AC
  Administered 2019-04-01: 0.4 mg via INTRAVENOUS

## 2019-04-01 MED ORDER — TECHNETIUM TC 99M TETROFOSMIN IV KIT
10.3000 | PACK | Freq: Once | INTRAVENOUS | Status: AC | PRN
  Administered 2019-04-01: 10.3 via INTRAVENOUS
  Filled 2019-04-01: qty 11

## 2019-04-03 DIAGNOSIS — E1029 Type 1 diabetes mellitus with other diabetic kidney complication: Secondary | ICD-10-CM | POA: Diagnosis not present

## 2019-04-03 DIAGNOSIS — N183 Chronic kidney disease, stage 3 unspecified: Secondary | ICD-10-CM | POA: Diagnosis not present

## 2019-04-03 DIAGNOSIS — R809 Proteinuria, unspecified: Secondary | ICD-10-CM | POA: Diagnosis not present

## 2019-04-03 DIAGNOSIS — I129 Hypertensive chronic kidney disease with stage 1 through stage 4 chronic kidney disease, or unspecified chronic kidney disease: Secondary | ICD-10-CM | POA: Diagnosis not present

## 2019-04-22 ENCOUNTER — Other Ambulatory Visit: Payer: Self-pay

## 2019-04-22 ENCOUNTER — Ambulatory Visit (INDEPENDENT_AMBULATORY_CARE_PROVIDER_SITE_OTHER): Payer: Medicare Other | Admitting: Pharmacist

## 2019-04-22 VITALS — BP 138/74 | HR 58 | Ht 67.0 in | Wt 202.0 lb

## 2019-04-22 DIAGNOSIS — I2510 Atherosclerotic heart disease of native coronary artery without angina pectoris: Secondary | ICD-10-CM

## 2019-04-22 DIAGNOSIS — I10 Essential (primary) hypertension: Secondary | ICD-10-CM | POA: Diagnosis not present

## 2019-04-22 NOTE — Patient Instructions (Signed)
Return for a  follow up appointment AS NEEDED  Check your blood pressure at home daily (if able) and keep record of the readings.  Take your BP meds as follows: *NO CHANGE*  Bring all of your meds, your BP cuff and your record of home blood pressures to your next appointment.  Exercise as you're able, try to walk approximately 30 minutes per day.  Keep salt intake to a minimum, especially watch canned and prepared boxed foods.  Eat more fresh fruits and vegetables and fewer canned items.  Avoid eating in fast food restaurants.    HOW TO TAKE YOUR BLOOD PRESSURE: . Rest 5 minutes before taking your blood pressure. .  Don't smoke or drink caffeinated beverages for at least 30 minutes before. . Take your blood pressure before (not after) you eat. . Sit comfortably with your back supported and both feet on the floor (don't cross your legs). . Elevate your arm to heart level on a table or a desk. . Use the proper sized cuff. It should fit smoothly and snugly around your bare upper arm. There should be enough room to slip a fingertip under the cuff. The bottom edge of the cuff should be 1 inch above the crease of the elbow. . Ideally, take 3 measurements at one sitting and record the average.    

## 2019-04-22 NOTE — Progress Notes (Signed)
Patient ID: Courtney Pearson                 DOB: 16-Oct-1950                      MRN: [id number]     HPI: Courtney Pearson is a 69 y.o. female referred by Dr. Oval Linsey to HTN clinic.  PMH includes atrial fibrillation/flutter, coronary calcification, hypertension, hyperlipidemia, and diabetes.  Patient follow up with Dr Rayann Heman for persitent atrial fibrillation and is awaiting consult for possible MAZE. Noted home BP arm cuff is accurate but reads above goal when heart off rhythm.   Current HTN meds:  Furosemide 20mg  daily as needed Losartan 50mg  daily Metoprolol 12.5mg  twice daily  BP goal: <130/80  Family History: The patient's family history includes Anemia in her father; Asthma in her sister; Heart disease in an other family member; Hypertension in her mother; Leukemia in her brother; Lupus in her child; OCD in her sister; Osteoarthritis in her mother and sister; Osteoporosis in her mother  Social History: The patient  reports that she has never smoked. She has never used smokeless tobacco. She reports that she does not drink alcohol.   Diet: aware of sodium in diet but not really limiting   Exercise: activities of daily living [facility] program for 12 weeks - completed)  Home BP readings: accurate within 47mmHg from manual reading 61 readings , range 97/56 - 154/85; average 129/72; HR range 58-99bpm  Wt Readings from Last 3 Encounters:  04/22/19 202 lb (91.6 kg)  04/01/19 200 lb (90.7 kg)  03/20/19 200 lb (90.7 kg)   BP Readings from Last 3 Encounters:  04/22/19 138/74  03/20/19 (!) 146/81  03/06/19 130/68   Pulse Readings from Last 3 Encounters:  04/22/19 (!) 58  03/20/19 65  03/06/19 75    Past Medical History:  Diagnosis Date  . CAD in native artery 03/20/2019  . Diabetes ([place])   . HTN (hypertension)   . Hyperlipidemia   . Hypothyroidism   . Palpitation   . Persistent atrial fibrillation ([place]) 04/03/2017  . Renal insufficiency     Current Outpatient Medications on  File Prior to Visit  Medication Sig Dispense Refill  . acetaminophen (TYLENOL) 500 MG tablet Take 500 mg by mouth daily as needed for moderate pain or headache.     Marland Kitchen amoxicillin (AMOXIL) 500 MG capsule Take 4 capsules by mouth as needed. 1 hour prior to dental appointments    . atorvastatin (LIPITOR) 10 MG tablet Take 10 mg by mouth every other day.     . bismuth subsalicylate (PEPTO BISMOL) 262 MG/15ML suspension Take 30 mLs by mouth every 6 (six) hours as needed for indigestion.    . calcium carbonate (TUMS EX) 750 MG chewable tablet Chew 750 mg by mouth 2 (two) times daily.     . Cholecalciferol (VITAMIN D-3) 25 MCG (1000 UT) CAPS Take 1,000 Units by mouth daily.    Marland Kitchen ELIQUIS 5 MG TABS tablet TAKE 1 TABLET TWICE DAILY 180 tablet 2  . fexofenadine (ALLEGRA) 180 MG tablet Take 180 mg by mouth daily as needed for allergies or rhinitis.    . furosemide (LASIX) 20 MG tablet TAKE 1 TABLET (20 MG TOTAL) BY MOUTH DAILY AS NEEDED FOR FLUID. 90 tablet 1  . hydrocortisone cream 1 % Apply 1 application topically daily as needed for itching.    . insulin aspart (NOVOLOG) 100 UNIT/ML injection Inject 25 Units into the skin  daily. Via insulin pump    . levothyroxine (SYNTHROID) 88 MCG tablet     . loperamide (IMODIUM A-D) 2 MG tablet Take 2-4 mg by mouth as needed for diarrhea or loose stools.     Marland Kitchen losartan (COZAAR) 50 MG tablet Take 1 tablet (50 mg total) by mouth daily. 90 tablet 1  . metoprolol tartrate (LOPRESSOR) 25 MG tablet Take 0.5 tablets (12.5 mg total) by mouth 2 (two) times daily. 90 tablet 3  . Multiple Vitamins-Minerals (MULTIVITAMIN ADULT PO) Take 1 tablet by mouth daily.    . potassium chloride (K-DUR) 10 MEQ tablet TAKE 1 TABLET BY MOUTH EVERY DAY 90 tablet 1   No current facility-administered medications on file prior to visit.    Allergies  Allergen Reactions  . 5-Alpha Reductase Inhibitors     Blood pressure 138/74, pulse (!) 58, height 5\' 7"  (1.702 m), weight 202 lb (91.6 kg),  SpO2 98 %.  Essential hypertension Blood pressure is slightly above goal during office visit but well controlled at home with an average reading of 129/72. Patient had some degree of white coat syndrome with an accurate home BP cuff and slightly evaluated numbers during every OV.   Will continue current BP regimen without changes.Patient is pending consult for potential MAZE to control permanent Afib. Recommended to take BP readings 3 times, then average readings for more accurate information. She will call back if BP start to increase above 140s or with any other questions.    [name] PharmD, BCPS, [place] [id number] 04/27/2019 2:46 PM

## 2019-04-27 ENCOUNTER — Encounter: Payer: Self-pay | Admitting: Pharmacist

## 2019-04-27 NOTE — Assessment & Plan Note (Signed)
Blood pressure is slightly above goal during office visit but well controlled at home with an average reading of 129/72. Patient had some degree of white coat syndrome with an accurate home BP cuff and slightly evaluated numbers during every OV.   Will continue current BP regimen without changes.Patient is pending consult for potential MAZE to control permanent Afib. Recommended to take BP readings 3 times, then average readings for more accurate information. She will call back if BP start to increase above 140s or with any other questions.

## 2019-05-14 ENCOUNTER — Telehealth: Payer: Medicare Other | Admitting: Internal Medicine

## 2019-05-28 ENCOUNTER — Other Ambulatory Visit: Payer: Self-pay

## 2019-05-28 ENCOUNTER — Encounter (INDEPENDENT_AMBULATORY_CARE_PROVIDER_SITE_OTHER): Payer: Medicare Other | Admitting: Ophthalmology

## 2019-05-28 DIAGNOSIS — I10 Essential (primary) hypertension: Secondary | ICD-10-CM | POA: Diagnosis not present

## 2019-05-28 DIAGNOSIS — H35033 Hypertensive retinopathy, bilateral: Secondary | ICD-10-CM

## 2019-05-28 DIAGNOSIS — H43813 Vitreous degeneration, bilateral: Secondary | ICD-10-CM | POA: Diagnosis not present

## 2019-05-28 DIAGNOSIS — E103593 Type 1 diabetes mellitus with proliferative diabetic retinopathy without macular edema, bilateral: Secondary | ICD-10-CM | POA: Diagnosis not present

## 2019-05-28 DIAGNOSIS — E10319 Type 1 diabetes mellitus with unspecified diabetic retinopathy without macular edema: Secondary | ICD-10-CM | POA: Diagnosis not present

## 2019-05-29 DIAGNOSIS — E103599 Type 1 diabetes mellitus with proliferative diabetic retinopathy without macular edema, unspecified eye: Secondary | ICD-10-CM | POA: Diagnosis not present

## 2019-05-29 DIAGNOSIS — Z794 Long term (current) use of insulin: Secondary | ICD-10-CM | POA: Diagnosis not present

## 2019-05-29 DIAGNOSIS — E11319 Type 2 diabetes mellitus with unspecified diabetic retinopathy without macular edema: Secondary | ICD-10-CM | POA: Diagnosis not present

## 2019-05-29 DIAGNOSIS — N1832 Chronic kidney disease, stage 3b: Secondary | ICD-10-CM | POA: Diagnosis not present

## 2019-05-29 DIAGNOSIS — E1042 Type 1 diabetes mellitus with diabetic polyneuropathy: Secondary | ICD-10-CM | POA: Diagnosis not present

## 2019-05-29 DIAGNOSIS — E039 Hypothyroidism, unspecified: Secondary | ICD-10-CM | POA: Diagnosis not present

## 2019-05-29 DIAGNOSIS — E1022 Type 1 diabetes mellitus with diabetic chronic kidney disease: Secondary | ICD-10-CM | POA: Diagnosis not present

## 2019-05-30 ENCOUNTER — Other Ambulatory Visit (HOSPITAL_COMMUNITY): Payer: Self-pay | Admitting: Nurse Practitioner

## 2019-06-19 DIAGNOSIS — E119 Type 2 diabetes mellitus without complications: Secondary | ICD-10-CM | POA: Diagnosis not present

## 2019-06-19 DIAGNOSIS — H52203 Unspecified astigmatism, bilateral: Secondary | ICD-10-CM | POA: Diagnosis not present

## 2019-06-19 DIAGNOSIS — H5203 Hypermetropia, bilateral: Secondary | ICD-10-CM | POA: Diagnosis not present

## 2019-06-19 DIAGNOSIS — H31003 Unspecified chorioretinal scars, bilateral: Secondary | ICD-10-CM | POA: Diagnosis not present

## 2019-06-27 ENCOUNTER — Other Ambulatory Visit: Payer: Self-pay

## 2019-06-27 ENCOUNTER — Ambulatory Visit (INDEPENDENT_AMBULATORY_CARE_PROVIDER_SITE_OTHER): Payer: Medicare Other | Admitting: Cardiovascular Disease

## 2019-06-27 ENCOUNTER — Encounter: Payer: Self-pay | Admitting: Cardiovascular Disease

## 2019-06-27 VITALS — BP 132/62 | HR 137 | Ht 67.0 in | Wt 206.0 lb

## 2019-06-27 DIAGNOSIS — I4819 Other persistent atrial fibrillation: Secondary | ICD-10-CM | POA: Diagnosis not present

## 2019-06-27 DIAGNOSIS — R0600 Dyspnea, unspecified: Secondary | ICD-10-CM | POA: Diagnosis not present

## 2019-06-27 DIAGNOSIS — I480 Paroxysmal atrial fibrillation: Secondary | ICD-10-CM

## 2019-06-27 DIAGNOSIS — R0609 Other forms of dyspnea: Secondary | ICD-10-CM

## 2019-06-27 DIAGNOSIS — I10 Essential (primary) hypertension: Secondary | ICD-10-CM

## 2019-06-27 DIAGNOSIS — I2510 Atherosclerotic heart disease of native coronary artery without angina pectoris: Secondary | ICD-10-CM | POA: Diagnosis not present

## 2019-06-27 NOTE — Patient Instructions (Signed)
Medication Instructions:  TAKE METOPROLOL 25 MG IF HEART RATE ABOVE 100   TAKE FUROSEMIDE 40 MG DAILY THROUGH Monday   *If you need a refill on your cardiac medications before your next appointment, please call your pharmacy*  Lab Work: NONE  Testing/Procedures: NONE  Follow-Up: At [facility], you and your health needs are our priority.  As part of our continuing mission to provide you with exceptional heart care, we have created designated Provider Care Teams.  These Care Teams include your primary Cardiologist (physician) and Advanced Practice Providers (APPs -  Physician Assistants and Nurse Practitioners) who all work together to provide you with the care you need, when you need it.  We recommend signing up for the patient portal called "MyChart".  Sign up information is provided on this After Visit Summary.  MyChart is used to connect with patients for Virtual Visits (Telemedicine).  Patients are able to view lab/test results, encounter notes, upcoming appointments, etc.  Non-urgent messages can be sent to your provider as well.   To learn more about what you can do with MyChart, go to [email or web address].    Your next appointment:   Your physician recommends that you schedule a follow-up appointment in: 2 MONTHS WITH DR  [facility]  You have been referred to [place]

## 2019-06-27 NOTE — Progress Notes (Signed)
Cardiology Office Note   Date:  06/27/2019   ID:  Courtney Pearson, DOB 18-May-1950, MRN [id number]  PCP:  Jonathon Jordan, MD  Cardiologist:  Lilli Light Oval Linsey, MD, [facility]  Electrophysiologist: Dr. Rayann Heman  No chief complaint on file.    History of Present Illness: Courtney Pearson is a 69 y.o. female with persistent atrial fibrillation/flutter s/p ablation 12/2017 and DCCV, coronary calcification, hypertension, hyperlipidemia, and diabetes here for follow up.  Courtney Pearson saw her endocrinologist 03/2017 and was noted to be in atrial flutter with ventricular rates in the 120s-140s.  Propranolol was switched to metoprolol and she was started on Eliquis.  She subsequently followed up in atrial fibrillation clinic and had an echo 04/12/2017 that revealed LVEF 55 to 60% with mildly elevated pulmonary pressures.  She was noted to have a small pericardial effusion but no evidence of tamponade.  She underwent cardioversion 04/2017 but had recurrent atrial for relation.  She wore an event monitor 05/2017 that showed she was in atrial fibrillation 50% of the time.  She also had a 3.3-second postconversion pause.  She was subsequently started on flecainide.  She has also been on amiodarone and failed multiple cardioversion.  She underwent atrial fibrillation ablation 12/2017.  She had recurrent atrial fibrillation and DCCV that was unsuccessful.  She had a consultation with Dr. Orvan Seen and is considering surgical ablation.  Prior to her ablation she had a preablation CT that had a calcium score 1712.  She had a [employer] 03/2019 that was negative for ischemia.  She feels her heart skipping around.  She gets short of breath very easily.  She notices it even walking form one side of the house to the other.  She had a consultation with Dr. Orvan Seen regarding MAZE.  She is concerned that the success rate may not be high and she doesn't want to go through it if it won't work.   Courtney Pearson isn't getting much  exercise.  She is limited by her breathing.  She has lower extremity edema and has been gaining weight.  She doesn't take lasix very often.  She has no orthopnea or PND.  She has no chest pain/pressure.  In general she doesn't see her heart rate spiking much on her Fitbit or BP monitor.  She thinks that her heart rate is higher today than usual.     Past Medical History:  Diagnosis Date  . CAD in native artery 03/20/2019  . Diabetes ([place])   . HTN (hypertension)   . Hyperlipidemia   . Hypothyroidism   . Palpitation   . Persistent atrial fibrillation ([place]) 04/03/2017  . Renal insufficiency     Past Surgical History:  Procedure Laterality Date  . ATRIAL FIBRILLATION ABLATION N/A 01/01/2018   Procedure: ATRIAL FIBRILLATION ABLATION;  Surgeon: Thompson Grayer, MD;  Location: [place] CV LAB;  Service: Cardiovascular;  Laterality: N/A;  . ATRIAL FIBRILLATION ABLATION N/A 10/31/2018   Procedure: ATRIAL FIBRILLATION ABLATION;  Surgeon: Thompson Grayer, MD;  Location: [place] CV LAB;  Service: Cardiovascular;  Laterality: N/A;  . BREAST BIOPSY    . CARDIOVERSION N/A 04/30/2017   Procedure: CARDIOVERSION;  Surgeon: [name], MD;  Location: [facility] ENDOSCOPY;  Service: Cardiovascular;  Laterality: N/A;  . CARDIOVERSION N/A 02/15/2018   Procedure: CARDIOVERSION;  Surgeon: [name], MD;  Location: [place];  Service: Cardiovascular;  Laterality: N/A;  . CARDIOVERSION N/A 04/10/2018   Procedure: CARDIOVERSION;  Surgeon: Josue Hector, MD;  Location: [facility] ENDOSCOPY;  Service: Cardiovascular;  Laterality: N/A;  . CESAREAN SECTION    . EYE SURGERY       Current Outpatient Medications  Medication Sig Dispense Refill  . acetaminophen (TYLENOL) 500 MG tablet Take 500 mg by mouth daily as needed for moderate pain or headache.     Marland Kitchen amoxicillin (AMOXIL) 500 MG capsule Take 4 capsules by mouth as needed. 1 hour prior to dental appointments    . atorvastatin (LIPITOR) 10 MG tablet Take 10  mg by mouth every other day.     . bismuth subsalicylate (PEPTO BISMOL) 262 MG/15ML suspension Take 30 mLs by mouth every 6 (six) hours as needed for indigestion.    . calcium carbonate (TUMS EX) 750 MG chewable tablet Chew 750 mg by mouth 2 (two) times daily.     . Cholecalciferol (VITAMIN D-3) 25 MCG (1000 UT) CAPS Take 1,000 Units by mouth daily.    Marland Kitchen ELIQUIS 5 MG TABS tablet TAKE 1 TABLET TWICE DAILY 180 tablet 2  . fexofenadine (ALLEGRA) 180 MG tablet Take 180 mg by mouth daily as needed for allergies or rhinitis.    . furosemide (LASIX) 20 MG tablet TAKE 1 TABLET (20 MG TOTAL) BY MOUTH DAILY AS NEEDED FOR FLUID. 90 tablet 1  . hydrocortisone cream 1 % Apply 1 application topically daily as needed for itching.    . insulin aspart (NOVOLOG) 100 UNIT/ML injection Inject 25 Units into the skin daily. Via insulin pump    . levothyroxine (SYNTHROID) 88 MCG tablet     . loperamide (IMODIUM A-D) 2 MG tablet Take 2-4 mg by mouth as needed for diarrhea or loose stools.     Marland Kitchen losartan (COZAAR) 50 MG tablet TAKE 1 TABLET BY MOUTH EVERY DAY 90 tablet 2  . metoprolol tartrate (LOPRESSOR) 25 MG tablet Take 0.5 tablets (12.5 mg total) by mouth 2 (two) times daily. 90 tablet 3  . Multiple Vitamins-Minerals (MULTIVITAMIN ADULT PO) Take 1 tablet by mouth daily.    . potassium chloride (K-DUR) 10 MEQ tablet TAKE 1 TABLET BY MOUTH EVERY DAY 90 tablet 1   No current facility-administered medications for this visit.    Allergies:   5-alpha reductase inhibitors    Social History:  The patient  reports that she has never smoked. She has never used smokeless tobacco. She reports that she does not drink alcohol.   Family History:  The patient's family history includes Anemia in her father; Asthma in her sister; Heart disease in an other family member; Hypertension in her mother; Leukemia in her brother; Lupus in her child; OCD in her sister; Osteoarthritis in her mother and sister; Osteoporosis in her mother.     ROS:  Please see the history of present illness.   Otherwise, review of systems are positive for none.   All other systems are reviewed and negative.    PHYSICAL EXAM: VS:  BP 132/62   Pulse (!) 137   Ht 5\' 7"  (1.702 m)   Wt 206 lb (93.4 kg)   SpO2 97%   BMI 32.26 kg/m  , BMI Body mass index is 32.26 kg/m. GENERAL:  Well appearing HEENT: Pupils equal round and reactive, fundi not visualized, oral mucosa unremarkable NECK:  No jugular venous distention, waveform within normal limits, carotid upstroke brisk and symmetric, no bruits LUNGS:  Clear to auscultation bilaterally HEART:  Irregularly irregular. Tachycardic.  PMI not displaced or sustained,S1 and S2 within normal limits, no S3, no S4, no clicks, no rubs, no murmurs  ABD:  Flat, positive bowel sounds normal in frequency in pitch, no bruits, no rebound, no guarding, no midline pulsatile mass, no hepatomegaly, no splenomegaly EXT:  2 plus pulses throughout, no edema, no cyanosis no clubbing SKIN:  No rashes no nodules NEURO:  Cranial nerves II through XII grossly intact, motor grossly intact throughout PSYCH:  Cognitively intact, oriented to person place and time   EKG:  EKG is ordered today. The ekg ordered 06/27/19 demonstrates atrial flutter.  Rate 70 bpm.    Echo 04/12/17: Study Conclusions  - Left ventricle: The cavity size was normal. Wall thickness was   normal. Systolic function was normal. The estimated ejection   fraction was in the range of 55% to 60%. Wall motion was normal;   there were no regional wall motion abnormalities. The study is   not technically sufficient to allow evaluation of LV diastolic   function. - Aortic valve: Trileaflet; mildly thickened, mildly calcified   leaflets. - Mitral valve: Calcified annulus. Mildly thickened leaflets . - Pulmonary arteries: Systolic pressure was mildly to moderately   increased. PA peak pressure: 46 mm Hg (S). - Pericardium, extracardiac: A small pericardial  effusion was   identified circumferential to the heart, mostly along the   posterior and right ventricular free wall. There was no evidence   of hemodynamic compromise.  14 Day Event Monitor 06/07/17:  Quality: Fair.  Baseline artifact. Predominant rhythm: atrial fibrillation 58%.  Rates up to 110 bpm. Average heart rate: 77 bpm 3.3 second post conversion pause noted. PACs and PVCs, atrial bigeminy noted   Pre-ablation cardiac CT 10/25/18: IMPRESSION: 1. Single left pulmonary vein with measurements as above. Ostial diameter measurements noted to be similar to CTA dated 12/25/2017, however area measurements noted to be smaller on this study for RUPV and left common trunk. 2. There is no thrombus in the left atrial appendage. 3. The esophagus runs in close proximity to the RUPV ostium. 4. Severe biatrial enlargement. 5. 3 vessel calcified atherosclerosis. Coronary calcium score of 1712.  Lexiscan Myoview 04/01/19:  There was no ST segment deviation noted during stress.  The study is normal.  This is a low risk study with no evidence of ischemia.  Systolic function is not assessed due to atrial fibrillation.   Recent Labs: 12/18/2018: ALT 20; BUN 32; Creatinine, Ser 1.34; Hemoglobin 12.3; Magnesium 1.9; Platelets 218; Potassium 4.7; Sodium 139; TSH 0.831   12/11/16: Sodium 140, potassium 4.4, BUN 26, creatinine 1.19 AST 22, ALT 18 Hemoglobin A1c 8.2% Total cholesterol 143, triglycerides 54, HDL 62, LDL 71   Lipid Panel No results found for: CHOL, TRIG, HDL, CHOLHDL, VLDL, LDLCALC, LDLDIRECT    Wt Readings from Last 3 Encounters:  06/27/19 206 lb (93.4 kg)  04/22/19 202 lb (91.6 kg)  04/01/19 200 lb (90.7 kg)      ASSESSMENT AND PLAN:  # Persistent atrial fibrillation:  Courtney Pearson is tachycardic and feeling poorly.  She was very tachycardic at first and it improved while waiting in the room.  She will increase metoprolol to 25mg  if her HR is >100mg .  Continue  Eliquis. She plans to follow-up with with Dr. Orvan Seen for consideration of surgical ablation.  # Coronary calcification:  # Hyperlipidemia:  Lexiscan Myoview was negative for ischemia 03/2019.  Continue Eliquis, atorvastatin, and metoprolol.  LDL was 50 on 05/2019.  # Hypertension: Blood pressure stable.  Continue losartan and metoprolol.   Current medicines are reviewed at length with the patient today.  The patient  does not have concerns regarding medicines.  The following changes have been made: none  Labs/ tests ordered today include:   No orders of the defined types were placed in this encounter.    Disposition:   FU with Marybeth Dandy C. Oval Linsey, MD, [facility] in 2 months.     Signed, Viviana Trimble C. Oval Linsey, MD, [facility]  06/27/2019 4:33 PM    [place]

## 2019-06-30 ENCOUNTER — Telehealth: Payer: Self-pay | Admitting: Cardiovascular Disease

## 2019-06-30 NOTE — Telephone Encounter (Signed)
Spoke with patient regarding appointment with the A Fib clinic scheduled Friday 07/04/19 at 10:30 am---parking code is 5007.   Patient states she has been seen in the A Fib clinic previously

## 2019-07-04 ENCOUNTER — Other Ambulatory Visit: Payer: Self-pay

## 2019-07-04 ENCOUNTER — Ambulatory Visit (HOSPITAL_COMMUNITY)
Admission: RE | Admit: 2019-07-04 | Discharge: 2019-07-04 | Disposition: A | Discharge status: Home / Self Care | Payer: Medicare Other | Source: Ambulatory Visit | Attending: Nurse Practitioner | Admitting: Nurse Practitioner

## 2019-07-04 VITALS — BP 136/76 | HR 105 | Ht 67.0 in | Wt 201.8 lb

## 2019-07-04 DIAGNOSIS — Z794 Long term (current) use of insulin: Secondary | ICD-10-CM | POA: Insufficient documentation

## 2019-07-04 DIAGNOSIS — G4733 Obstructive sleep apnea (adult) (pediatric): Secondary | ICD-10-CM | POA: Insufficient documentation

## 2019-07-04 DIAGNOSIS — I4819 Other persistent atrial fibrillation: Secondary | ICD-10-CM | POA: Diagnosis not present

## 2019-07-04 DIAGNOSIS — E119 Type 2 diabetes mellitus without complications: Secondary | ICD-10-CM | POA: Diagnosis not present

## 2019-07-04 DIAGNOSIS — Z79899 Other long term (current) drug therapy: Secondary | ICD-10-CM | POA: Diagnosis not present

## 2019-07-04 DIAGNOSIS — I2510 Atherosclerotic heart disease of native coronary artery without angina pectoris: Secondary | ICD-10-CM | POA: Diagnosis not present

## 2019-07-04 DIAGNOSIS — Z8249 Family history of ischemic heart disease and other diseases of the circulatory system: Secondary | ICD-10-CM | POA: Diagnosis not present

## 2019-07-04 DIAGNOSIS — Z7901 Long term (current) use of anticoagulants: Secondary | ICD-10-CM | POA: Insufficient documentation

## 2019-07-04 DIAGNOSIS — E039 Hypothyroidism, unspecified: Secondary | ICD-10-CM | POA: Insufficient documentation

## 2019-07-04 DIAGNOSIS — D6869 Other thrombophilia: Secondary | ICD-10-CM

## 2019-07-04 DIAGNOSIS — I10 Essential (primary) hypertension: Secondary | ICD-10-CM | POA: Diagnosis not present

## 2019-07-04 DIAGNOSIS — E785 Hyperlipidemia, unspecified: Secondary | ICD-10-CM | POA: Diagnosis not present

## 2019-07-04 DIAGNOSIS — Z9989 Dependence on other enabling machines and devices: Secondary | ICD-10-CM | POA: Insufficient documentation

## 2019-07-07 ENCOUNTER — Telehealth: Payer: Self-pay | Admitting: Cardiovascular Disease

## 2019-07-07 ENCOUNTER — Encounter (HOSPITAL_COMMUNITY): Payer: Self-pay | Admitting: Nurse Practitioner

## 2019-07-07 ENCOUNTER — Encounter: Payer: Self-pay | Admitting: Cardiovascular Disease

## 2019-07-07 NOTE — Telephone Encounter (Signed)
-----   Message from Courtney Hansen, LPN sent at 07/23/2785  6:24 PM EDT ----- Hello all Patient needs 2 month ov with Dr Oval Linsey or APP  Thanks Rip Harbour

## 2019-07-07 NOTE — Progress Notes (Signed)
Electrophysiology  Note   Date:  07/07/2019   ID:  GENEAL HUEBERT, DOB August 10, 1950, MRN [id number]   Provider location: [address] [place], [place] [address] Evaluation Performed: Follow up afib    PCP:  Jonathon Jordan, MD  Primary Cardiologist:  Dr. Oval Linsey Primary Electrophysiologist: Dr. Rayann Heman   CC: f/u afib    History of Present Illness: Courtney Pearson is a 69 y.o. female who presents  in the afib clinic for persistent afib.She recently saw Dr. Oval Linsey who referred her back here as she has ongoing  fatigue, exertional dyspnea with her afib.   She had  Afib ablation 12/2017. Pt had to be cardioverted about one month after ablation and again when she saw Dr. Rayann Heman at the 3 month f/u early March 2020. She  has failed flecainide. Dr. Rayann Heman did refer her to Dr. Orvan Seen to be considered for Maze  procedure but she is really on the fence about this having this done. . Dr. Rayann Heman did not think repeat ablation would be helpful. She has significant  bradycardia when BB dose is pushed.   I did discuss tikosyn with her if she does not want to proceed with MAze procedure. I will further discuss with Dr. Rayann Heman.  Today, she denies symptoms of palpitations, chest pain, + for shortness of breath with exertion, no orthopnea, PND, lower extremity edema, claudication, dizziness, presyncope, syncope, bleeding, or neurologic sequela. The patient is tolerating medications without difficulties and is otherwise without complaint today.   she denies symptoms of cough, fevers, chills, or new SOB worrisome for COVID 19.     Atrial Fibrillation Risk Factors:  she does not have symptoms or diagnosis of sleep apnea.  therapy. she does not have a history of rheumatic fever. she does not have a history of alcohol use. The patient does not have a history of early familial atrial fibrillation or other arrhythmias.  she has a BMI of Body mass index is 31.61 kg/m.Marland Kitchen Filed Weights   07/04/19 1056    Weight: 91.5 kg    Past Medical History:  Diagnosis Date   CAD in native artery 03/20/2019   Diabetes ([place])    HTN (hypertension)    Hyperlipidemia    Hypothyroidism    Palpitation    Persistent atrial fibrillation ([place]) 04/03/2017   Renal insufficiency    Past Surgical History:  Procedure Laterality Date   ATRIAL FIBRILLATION ABLATION N/A 01/01/2018   Procedure: ATRIAL FIBRILLATION ABLATION;  Surgeon: Thompson Grayer, MD;  Location: [place] CV LAB;  Service: Cardiovascular;  Laterality: N/A;   ATRIAL FIBRILLATION ABLATION N/A 10/31/2018   Procedure: ATRIAL FIBRILLATION ABLATION;  Surgeon: Thompson Grayer, MD;  Location: [place] CV LAB;  Service: Cardiovascular;  Laterality: N/A;   BREAST BIOPSY     CARDIOVERSION N/A 04/30/2017   Procedure: CARDIOVERSION;  Surgeon: [name], MD;  Location: [place];  Service: Cardiovascular;  Laterality: N/A;   CARDIOVERSION N/A 02/15/2018   Procedure: CARDIOVERSION;  Surgeon: [name], MD;  Location: [place];  Service: Cardiovascular;  Laterality: N/A;   CARDIOVERSION N/A 04/10/2018   Procedure: CARDIOVERSION;  Surgeon: Josue Hector, MD;  Location: [facility] ENDOSCOPY;  Service: Cardiovascular;  Laterality: N/A;   CESAREAN SECTION     EYE SURGERY       Current Outpatient Medications  Medication Sig Dispense Refill   acetaminophen (TYLENOL) 500 MG tablet Take 500 mg by mouth as needed for moderate pain or headache.  amoxicillin (AMOXIL) 500 MG capsule Take 4 capsules by mouth as needed. 1 hour prior to dental appointments     atorvastatin (LIPITOR) 10 MG tablet Take 10 mg by mouth every other day.      bismuth subsalicylate (PEPTO BISMOL) 262 MG/15ML suspension Take 30 mLs by mouth as needed for indigestion.      calcium carbonate (TUMS EX) 750 MG chewable tablet Chew 750 mg by mouth daily.      Cholecalciferol (VITAMIN D-3) 25 MCG (1000 UT) CAPS Take 1,000 Units by mouth daily.     ELIQUIS 5  MG TABS tablet TAKE 1 TABLET TWICE DAILY 180 tablet 2   fexofenadine (ALLEGRA) 180 MG tablet Take 180 mg by mouth as needed for allergies or rhinitis.      furosemide (LASIX) 20 MG tablet TAKE 1 TABLET (20 MG TOTAL) BY MOUTH DAILY AS NEEDED FOR FLUID. (Patient taking differently: Take 20 mg by mouth as needed for fluid. ) 90 tablet 1   hydrocortisone cream 1 % Apply 1 application topically daily as needed for itching.     insulin aspart (NOVOLOG) 100 UNIT/ML injection Inject 25 Units into the skin daily. Via insulin pump     levothyroxine (SYNTHROID) 88 MCG tablet Take 88 mcg by mouth daily.      loperamide (IMODIUM A-D) 2 MG tablet Take 2-4 mg by mouth as needed for diarrhea or loose stools.      losartan (COZAAR) 50 MG tablet TAKE 1 TABLET BY MOUTH EVERY DAY 90 tablet 2   metoprolol tartrate (LOPRESSOR) 25 MG tablet Take 0.5 tablets (12.5 mg total) by mouth 2 (two) times daily. 90 tablet 3   Multiple Vitamins-Minerals (MULTIVITAMIN ADULT PO) Take 1 tablet by mouth daily.     potassium chloride (K-DUR) 10 MEQ tablet TAKE 1 TABLET BY MOUTH EVERY DAY 90 tablet 1   No current facility-administered medications for this encounter.    Allergies:   5-alpha reductase inhibitors   Social History:  The patient  reports that she has never smoked. She has never used smokeless tobacco. She reports that she does not drink alcohol.   Family History:  The patient's  family history includes Anemia in her father; Asthma in her sister; Heart disease in an other family member; Hypertension in her mother; Leukemia in her brother; Lupus in her child; OCD in her sister; Osteoarthritis in her mother and sister; Osteoporosis in her mother.    ROS:  Please see the history of present illness.   All other systems are personally reviewed and negative.   Exam: GEN- The patient is well appearing, alert and oriented x 3 today.   Head- normocephalic, atraumatic Eyes-  Sclera clear, conjunctiva pink Ears-  hearing intact Oropharynx- clear Lungs- Clear to ausculation bilaterally, normal work of breathing Heart- irregular rate and rhythm, no murmurs, rubs or gallops, PMI not laterally displaced GI- soft, NT, ND, + BS Extremities- no clubbing, cyanosis, or edema   Recent Labs: 12/18/2018: ALT 20; BUN 32; Creatinine, Ser 1.34; Hemoglobin 12.3; Magnesium 1.9; Platelets 218; Potassium 4.7; Sodium 139; TSH 0.831  personally reviewed    Other studies personally reviewed: Epic records reviewed  EKG- atrial flutter at 105 bpm, qrs int 82 ms, qtc 465 ms   ASSESSMENT AND PLAN:  1. Persistent  atrial fibrillation Pt with ablation in December 2019 and two cardioversions since then Higher doses of BB recently stopped in past  due to v rates in the 40's Referred for Maze but not pt  really wanting to pursue this I discussed tikosyn with her, qt in SR 445 ms, Dr. Rayann Heman is in agreement if pt wants to try  She will consider this and get back to me  Continue eliquis 5 mg bid,for [id number] score of at least 4 Continue  cpap for OSA   2. HTN Stable    Orders Placed This Encounter  Procedures   EKG 12-Lead   EKG 12-Lead    Patient Risk:  after full review of this patients clinical status, I feel that they are at  MODERATE risk at this time.    Signed, Roderic Palau NP 07/07/2019 8:53 AM  Afib [facility] [address] [place], [place] [address] [phone]

## 2019-07-07 NOTE — Telephone Encounter (Signed)
Patient has been called 3 times:  06/30/19 8:23am called to schedule 2 month f/u with Dr. Oval Linsey or an APP per staff message, VM is full - lcn     07/02/19 8:41am called to schedule 2 month f/u with Dr. Oval Linsey or an APP per staff message, VM is full - lcn     07/04/19 8:03am called to schedule 2 month f/u with Dr. Oval Linsey or an APP, VM full - lcn

## 2019-09-02 DIAGNOSIS — E1042 Type 1 diabetes mellitus with diabetic polyneuropathy: Secondary | ICD-10-CM | POA: Diagnosis not present

## 2019-09-02 DIAGNOSIS — N1832 Chronic kidney disease, stage 3b: Secondary | ICD-10-CM | POA: Diagnosis not present

## 2019-09-02 DIAGNOSIS — Z794 Long term (current) use of insulin: Secondary | ICD-10-CM | POA: Diagnosis not present

## 2019-09-02 DIAGNOSIS — E11319 Type 2 diabetes mellitus with unspecified diabetic retinopathy without macular edema: Secondary | ICD-10-CM | POA: Diagnosis not present

## 2019-09-02 DIAGNOSIS — E1165 Type 2 diabetes mellitus with hyperglycemia: Secondary | ICD-10-CM | POA: Diagnosis not present

## 2019-09-02 DIAGNOSIS — E1022 Type 1 diabetes mellitus with diabetic chronic kidney disease: Secondary | ICD-10-CM | POA: Diagnosis not present

## 2019-09-02 DIAGNOSIS — E039 Hypothyroidism, unspecified: Secondary | ICD-10-CM | POA: Diagnosis not present

## 2019-09-12 ENCOUNTER — Other Ambulatory Visit: Payer: Self-pay

## 2019-09-12 ENCOUNTER — Ambulatory Visit (INDEPENDENT_AMBULATORY_CARE_PROVIDER_SITE_OTHER): Payer: Medicare Other | Admitting: Cardiovascular Disease

## 2019-09-12 ENCOUNTER — Encounter: Payer: Self-pay | Admitting: Cardiovascular Disease

## 2019-09-12 VITALS — BP 124/68 | HR 76 | Temp 96.1°F | Ht 67.0 in | Wt 203.0 lb

## 2019-09-12 DIAGNOSIS — E781 Pure hyperglyceridemia: Secondary | ICD-10-CM

## 2019-09-12 DIAGNOSIS — I10 Essential (primary) hypertension: Secondary | ICD-10-CM

## 2019-09-12 DIAGNOSIS — I4819 Other persistent atrial fibrillation: Secondary | ICD-10-CM | POA: Diagnosis not present

## 2019-09-12 DIAGNOSIS — R002 Palpitations: Secondary | ICD-10-CM

## 2019-09-12 DIAGNOSIS — I2510 Atherosclerotic heart disease of native coronary artery without angina pectoris: Secondary | ICD-10-CM | POA: Diagnosis not present

## 2019-09-12 MED ORDER — LOSARTAN POTASSIUM 25 MG PO TABS: ORAL_TABLET | ORAL | 3 refills | Status: DC

## 2019-09-12 NOTE — Patient Instructions (Signed)
Medication Instructions:  CHANGE YOUR LOSARTAN TO 12.5 MG TWICE A DAY   *If you need a refill on your cardiac medications before your next appointment, please call your pharmacy*  Lab Work: NONE   Testing/Procedures: NONE  Follow-Up: At [facility], you and your health needs are our priority.  As part of our continuing mission to provide you with exceptional heart care, we have created designated Provider Care Teams.  These Care Teams include your primary Cardiologist (physician) and Advanced Practice Providers (APPs -  Physician Assistants and Nurse Practitioners) who all work together to provide you with the care you need, when you need it.  We recommend signing up for the patient portal called "MyChart".  Sign up information is provided on this After Visit Summary.  MyChart is used to connect with patients for Virtual Visits (Telemedicine).  Patients are able to view lab/test results, encounter notes, upcoming appointments, etc.  Non-urgent messages can be sent to your provider as well.   To learn more about what you can do with MyChart, go to [email or web address].    Your next appointment:   6 month(s)  The format for your next appointment:   In Person  Provider:   You may see [name], MD or one of the following Advanced Practice Providers on your designated Care Team:    Kerin Ransom, PA-C  [place], [place]  Coletta Memos, 

## 2019-09-12 NOTE — Progress Notes (Signed)
Cardiology Office Note   Date:  09/12/2019   ID:  Courtney Pearson, DOB 1950/02/21, MRN [id number]  PCP:  Jonathon Jordan, MD  Cardiologist:  Lilli Light Oval Linsey, MD, [facility]  Electrophysiologist: Dr. Rayann Heman  No chief complaint on file.    History of Present Illness: Courtney Pearson is a 69 y.o. female with persistent atrial fibrillation/flutter s/p ablation 12/2017 and DCCV, coronary calcification, hypertension, hyperlipidemia, and diabetes here for follow up.  [name] saw her endocrinologist 03/2017 and was noted to be in atrial flutter with ventricular rates in the 120s-140s.  Propranolol was switched to metoprolol and she was started on Eliquis.  She subsequently followed up in atrial fibrillation clinic and had an echo 04/12/2017 that revealed LVEF 55 to 60% with mildly elevated pulmonary pressures.  She was noted to have a small pericardial effusion but no evidence of tamponade.  She underwent cardioversion 04/2017 but had recurrent atrial for relation.  She wore an event monitor 05/2017 that showed she was in atrial fibrillation 50% of the time.  She also had a 3.3-second postconversion pause.  She was subsequently started on flecainide.  She has also been on amiodarone and failed multiple cardioversion.  She underwent atrial fibrillation ablation 12/2017.  She had recurrent atrial fibrillation and DCCV that was unsuccessful.  She had a consultation with Dr. Orvan Seen and is considering surgical ablation.  Prior to her ablation she had a preablation CT that had a calcium score 1712.  She had a [employer] 03/2019 that was negative for ischemia.  She feels her heart skipping around.  She gets short of breath very easily.  She notices it even walking form one side of the house to the other.  She had a consultation with Dr. Orvan Seen regarding MAZE.  She is concerned that the success rate may not be high and she doesn't want to go through it if it won't work.   At her last appointment she was  feeling poorly and her heart rate was initially elevated.  It improved with rest.  She was referred back to atrial fibrillation clinic and was advised to Tikosyn was an option.  She continues to have intermittent atrial fibrillation.  Her heart rate has been well-controlled.  When she checks on the Kardia device she is in atrial fibrillation about half the time.  She continues to complain of not having energy.  She is unsure how much of this is due to atrial fibrillation vs. Deconditioning and being out of shape. She has no chest pain or edema.  She gets short of breath with exertion.  She tries to do some walking in place but her legs are weak.  Her BP at home has been as low as the 90s/60s at times.  She feels more lightheded when this occurs.  She reduced losartan to 25mg  and continues to have some lows.    Past Medical History:  Diagnosis Date  . CAD in native artery 03/20/2019  . Diabetes ([place])   . HTN (hypertension)   . Hyperlipidemia   . Hypothyroidism   . Palpitation   . Persistent atrial fibrillation ([place]) 04/03/2017  . Renal insufficiency     Past Surgical History:  Procedure Laterality Date  . ATRIAL FIBRILLATION ABLATION N/A 01/01/2018   Procedure: ATRIAL FIBRILLATION ABLATION;  Surgeon: Thompson Grayer, MD;  Location: [facility];  Service: Cardiovascular;  Laterality: N/A;  . ATRIAL FIBRILLATION ABLATION N/A 10/31/2018   Procedure: ATRIAL FIBRILLATION ABLATION;  Surgeon: Rayann Heman,  Jeneen Rinks, MD;  Location: [facility];  Service: Cardiovascular;  Laterality: N/A;  . BREAST BIOPSY    . CARDIOVERSION N/A 04/30/2017   Procedure: CARDIOVERSION;  Surgeon: [name], MD;  Location: [facility] ENDOSCOPY;  Service: Cardiovascular;  Laterality: N/A;  . CARDIOVERSION N/A 02/15/2018   Procedure: CARDIOVERSION;  Surgeon: [name], MD;  [facility] ENDOSCOPY;  Service: Cardiovascular;  Laterality: N/A;  . CARDIOVERSION N/A 04/10/2018   Procedure: CARDIOVERSION;  Surgeon: Josue Hector, MD;  Location: [facility];  Service: Cardiovascular;  Laterality: N/A;  . CESAREAN SECTION    . EYE SURGERY       Current Outpatient Medications  Medication Sig Dispense Refill  . acetaminophen (TYLENOL) 500 MG tablet Take 500 mg by mouth as needed for moderate pain or headache.     Marland Kitchen atorvastatin (LIPITOR) 10 MG tablet Take 10 mg by mouth every other day.     . bismuth subsalicylate (PEPTO BISMOL) 262 MG/15ML suspension Take 30 mLs by mouth as needed for indigestion.     . calcium carbonate (TUMS EX) 750 MG chewable tablet Chew 750 mg by mouth daily.     . Cholecalciferol (VITAMIN D-3) 25 MCG (1000 UT) CAPS Take 1,000 Units by mouth daily.    Marland Kitchen ELIQUIS 5 MG TABS tablet TAKE 1 TABLET TWICE DAILY 180 tablet 2  . fexofenadine (ALLEGRA) 180 MG tablet Take 180 mg by mouth as needed for allergies or rhinitis.     . furosemide (LASIX) 20 MG tablet TAKE 1 TABLET (20 MG TOTAL) BY MOUTH DAILY AS NEEDED FOR FLUID. (Patient taking differently: Take 20 mg by mouth as needed for fluid. ) 90 tablet 1  . hydrocortisone cream 1 % Apply 1 application topically daily as needed for itching.    . insulin aspart (NOVOLOG) 100 UNIT/ML injection Inject 25 Units into the skin daily. Via insulin pump    . levothyroxine (SYNTHROID) 88 MCG tablet Take 88 mcg by mouth daily.     Marland Kitchen loperamide (IMODIUM A-D) 2 MG tablet Take 2-4 mg by mouth as needed for diarrhea or loose stools.     Marland Kitchen losartan (COZAAR) 25 MG tablet TAKE 1/2 TABLET TWICE A DAY 90 tablet 3  . metoprolol tartrate (LOPRESSOR) 25 MG tablet Take 0.5 tablets (12.5 mg total) by mouth 2 (two) times daily. 90 tablet 3  . Multiple Vitamins-Minerals (MULTIVITAMIN ADULT PO) Take 1 tablet by mouth daily.    . potassium chloride (K-DUR) 10 MEQ tablet TAKE 1 TABLET BY MOUTH EVERY DAY 90 tablet 1  . amoxicillin (AMOXIL) 500 MG capsule Take 4 capsules by mouth as needed. 1 hour prior to dental appointments (Patient not taking: Reported on 09/12/2019)     No  current facility-administered medications for this visit.    Allergies:   5-alpha reductase inhibitors    Social History:  The patient  reports that she has never smoked. She has never used smokeless tobacco. She reports that she does not drink alcohol.   Family History:  The patient's family history includes Anemia in her father; Asthma in her sister; Heart disease in an other family member; Hypertension in her mother; Leukemia in her brother; Lupus in her child; OCD in her sister; Osteoarthritis in her mother and sister; Osteoporosis in her mother.    ROS:  Please see the history of present illness.   Otherwise, review of systems are positive for none.   All other systems are reviewed and negative.    PHYSICAL EXAM: VS:  BP 124/68   Pulse 76   Temp (!) 96.1 F (35.6 C)   Ht 5\' 7"  (1.702 m)   Wt 203 lb (92.1 kg)   SpO2 97%   BMI 31.79 kg/m  , BMI Body mass index is 31.79 kg/m. GENERAL:  Well appearing HEENT: Pupils equal round and reactive, fundi not visualized, oral mucosa unremarkable NECK:  No jugular venous distention, waveform within normal limits, carotid upstroke brisk and symmetric, no bruits LUNGS:  Clear to auscultation bilaterally HEART: Regular  PMI not displaced or sustained,S1 and S2 within normal limits, no S3, no S4, no clicks, no rubs, no murmurs ABD:  Flat, positive bowel sounds normal in frequency in pitch, no bruits, no rebound, no guarding, no midline pulsatile mass, no hepatomegaly, no splenomegaly EXT:  2 plus pulses throughout, 1+ bilateral edema, no cyanosis no clubbing SKIN:  No rashes no nodules NEURO:  Cranial nerves II through XII grossly intact, motor grossly intact throughout PSYCH:  Cognitively intact, oriented to person place and time   EKG:  EKG is not ordered today. The ekg ordered 06/27/19 demonstrates atrial flutter.  Rate 70 bpm.    Echo 04/12/17: Study Conclusions  - Left ventricle: The cavity size was normal. Wall thickness was    normal. Systolic function was normal. The estimated ejection   fraction was in the range of 55% to 60%. Wall motion was normal;   there were no regional wall motion abnormalities. The study is   not technically sufficient to allow evaluation of LV diastolic   function. - Aortic valve: Trileaflet; mildly thickened, mildly calcified   leaflets. - Mitral valve: Calcified annulus. Mildly thickened leaflets . - Pulmonary arteries: Systolic pressure was mildly to moderately   increased. PA peak pressure: 46 mm Hg (S). - Pericardium, extracardiac: A small pericardial effusion was   identified circumferential to the heart, mostly along the   posterior and right ventricular free wall. There was no evidence   of hemodynamic compromise.  14 Day Event Monitor 06/07/17:  Quality: Fair.  Baseline artifact. Predominant rhythm: atrial fibrillation 58%.  Rates up to 110 bpm. Average heart rate: 77 bpm 3.3 second post conversion pause noted. PACs and PVCs, atrial bigeminy noted   Pre-ablation cardiac CT 10/25/18: IMPRESSION: 1. Single left pulmonary vein with measurements as above. Ostial diameter measurements noted to be similar to CTA dated 12/25/2017, however area measurements noted to be smaller on this study for RUPV and left common trunk. 2. There is no thrombus in the left atrial appendage. 3. The esophagus runs in close proximity to the RUPV ostium. 4. Severe biatrial enlargement. 5. 3 vessel calcified atherosclerosis. Coronary calcium score of 1712.  Lexiscan Myoview 04/01/19:  There was no ST segment deviation noted during stress.  The study is normal.  This is a low risk study with no evidence of ischemia.  Systolic function is not assessed due to atrial fibrillation.   Recent Labs: 12/18/2018: ALT 20; BUN 32; Creatinine, Ser 1.34; Hemoglobin 12.3; Magnesium 1.9; Platelets 218; Potassium 4.7; Sodium 139; TSH 0.831   12/11/16: Sodium 140, potassium 4.4, BUN 26, creatinine  1.19 AST 22, ALT 18 Hemoglobin A1c 8.2% Total cholesterol 143, triglycerides 54, HDL 62, LDL 71   Lipid Panel No results found for: CHOL, TRIG, HDL, CHOLHDL, VLDL, LDLCALC, LDLDIRECT    Wt Readings from Last 3 Encounters:  09/12/19 203 lb (92.1 kg)  07/04/19 201 lb 12.8 oz (91.5 kg)  06/27/19 206 lb (93.4 kg)  ASSESSMENT AND PLAN:  # Persistent atrial fibrillation:  Heart rates are better controlled but she is in and out of atrial fibrillation. Continue Eliquis and metoprolol.  She is leaning towards a MAZE procedure.  # Coronary calcification:  # Hyperlipidemia:  Lexiscan Myoview was negative for ischemia 03/2019.  Continue Eliquis, atorvastatin, and metoprolol.  LDL was 50 on 05/2019.  # Hypertension: Blood pressure stable.  She has some low BP.  Reduce losartan to 25 mg and continue metoprolol.   Continue losartan and metoprolol.   Current medicines are reviewed at length with the patient today.  The patient does not have concerns regarding medicines.  The following changes have been made: none  Labs/ tests ordered today include:   No orders of the defined types were placed in this encounter.    Disposition:   FU with Zaine Elsass C. Oval Linsey, MD, [facility] in 6 months.     Signed, Mahagony Grieb C. Oval Linsey, MD, [facility]  09/12/2019 12:03 PM    [place]

## 2019-10-11 ENCOUNTER — Other Ambulatory Visit: Payer: Self-pay

## 2019-10-11 ENCOUNTER — Other Ambulatory Visit: Payer: Medicare Other

## 2019-10-11 DIAGNOSIS — Z20822 Contact with and (suspected) exposure to covid-19: Secondary | ICD-10-CM

## 2019-10-13 LAB — SARS-COV-2, NAA 2 DAY TAT

## 2019-10-13 LAB — NOVEL CORONAVIRUS, NAA: SARS-CoV-2, NAA: NOT DETECTED

## 2019-10-20 ENCOUNTER — Telehealth: Payer: Self-pay | Admitting: Physician Assistant

## 2019-10-20 DIAGNOSIS — N390 Urinary tract infection, site not specified: Secondary | ICD-10-CM | POA: Diagnosis not present

## 2019-10-20 DIAGNOSIS — E1165 Type 2 diabetes mellitus with hyperglycemia: Secondary | ICD-10-CM | POA: Diagnosis not present

## 2019-10-20 DIAGNOSIS — R001 Bradycardia, unspecified: Secondary | ICD-10-CM | POA: Diagnosis not present

## 2019-10-20 DIAGNOSIS — Z03818 Encounter for observation for suspected exposure to other biological agents ruled out: Secondary | ICD-10-CM | POA: Diagnosis not present

## 2019-10-20 DIAGNOSIS — Z794 Long term (current) use of insulin: Secondary | ICD-10-CM | POA: Diagnosis not present

## 2019-10-20 DIAGNOSIS — R9431 Abnormal electrocardiogram [ECG] [EKG]: Secondary | ICD-10-CM | POA: Diagnosis not present

## 2019-10-20 DIAGNOSIS — R509 Fever, unspecified: Secondary | ICD-10-CM | POA: Diagnosis not present

## 2019-10-20 DIAGNOSIS — J22 Unspecified acute lower respiratory infection: Secondary | ICD-10-CM | POA: Diagnosis not present

## 2019-10-20 NOTE — Telephone Encounter (Signed)
   Dr. Sherrlyn Hock with [facility] called the answering service after-hours today. Patient was seen there this evening for fever, tested negative for Covid, tested positive for UTI. Patient reported to the provider she has occasionally been seeing heart rates in the 40s at home by home monitor. Dr. Blenda Mounts chart reviewed - complex hx as outlined of atrial fib/flutter. Dr. Sherrlyn Hock states at the office visit she was NSR 59bpm. The patient also had denied any cardiac symptoms to go along with these rates. Dr. Sherrlyn Hock wanted to request close outpatient cardiology follow-up. I will send to our [facility] triage team to see if they can assist with scheduling tomorrow - could potentially be virtual visit given recent fever to allow for her time to recover from her acute illness but still touch base with our team.  Charlie Pitter, PA-C

## 2019-10-21 NOTE — Telephone Encounter (Signed)
Contacted and arranged virtual visit with [facility] on09/30/21 @9 :15 am.

## 2019-10-22 ENCOUNTER — Telehealth (HOSPITAL_COMMUNITY): Payer: Self-pay | Admitting: *Deleted

## 2019-10-22 NOTE — Telephone Encounter (Signed)
[facility] from [facility] from 9/27 in question faxed and reviewed by Roderic Palau NP - findings consistent with previous EKGs no new findings noted. No need to repeat EKG - pt notified of recommendations. Will have EKG scanned in.

## 2019-10-23 ENCOUNTER — Telehealth: Payer: Medicare Other | Admitting: Physician Assistant

## 2019-10-29 DIAGNOSIS — Z23 Encounter for immunization: Secondary | ICD-10-CM | POA: Diagnosis not present

## 2019-10-30 DIAGNOSIS — H903 Sensorineural hearing loss, bilateral: Secondary | ICD-10-CM | POA: Diagnosis not present

## 2019-11-04 DIAGNOSIS — Z23 Encounter for immunization: Secondary | ICD-10-CM | POA: Diagnosis not present

## 2019-11-06 ENCOUNTER — Other Ambulatory Visit (HOSPITAL_COMMUNITY): Payer: Self-pay | Admitting: Nurse Practitioner

## 2019-11-07 MED ORDER — APIXABAN 5 MG PO TABS
5.0000 mg | ORAL_TABLET | Freq: Two times a day (BID) | ORAL | 2 refills | Status: DC
Start: 2019-11-07 — End: 2020-08-19

## 2019-11-07 NOTE — Addendum Note (Signed)
Addended by: Juluis Mire on: 11/07/2019 01:28 PM   Modules accepted: Orders

## 2019-11-13 DIAGNOSIS — H6121 Impacted cerumen, right ear: Secondary | ICD-10-CM | POA: Diagnosis not present

## 2019-11-28 ENCOUNTER — Encounter (INDEPENDENT_AMBULATORY_CARE_PROVIDER_SITE_OTHER): Payer: Medicare Other | Admitting: Ophthalmology

## 2019-11-28 ENCOUNTER — Other Ambulatory Visit: Payer: Self-pay

## 2019-11-28 DIAGNOSIS — H43813 Vitreous degeneration, bilateral: Secondary | ICD-10-CM | POA: Diagnosis not present

## 2019-11-28 DIAGNOSIS — E103512 Type 1 diabetes mellitus with proliferative diabetic retinopathy with macular edema, left eye: Secondary | ICD-10-CM

## 2019-11-28 DIAGNOSIS — H35033 Hypertensive retinopathy, bilateral: Secondary | ICD-10-CM | POA: Diagnosis not present

## 2019-11-28 DIAGNOSIS — I10 Essential (primary) hypertension: Secondary | ICD-10-CM | POA: Diagnosis not present

## 2019-11-28 DIAGNOSIS — E103591 Type 1 diabetes mellitus with proliferative diabetic retinopathy without macular edema, right eye: Secondary | ICD-10-CM

## 2019-11-28 DIAGNOSIS — E10311 Type 1 diabetes mellitus with unspecified diabetic retinopathy with macular edema: Secondary | ICD-10-CM

## 2019-12-03 DIAGNOSIS — Z794 Long term (current) use of insulin: Secondary | ICD-10-CM | POA: Diagnosis not present

## 2019-12-03 DIAGNOSIS — E039 Hypothyroidism, unspecified: Secondary | ICD-10-CM | POA: Diagnosis not present

## 2019-12-03 DIAGNOSIS — E1042 Type 1 diabetes mellitus with diabetic polyneuropathy: Secondary | ICD-10-CM | POA: Diagnosis not present

## 2019-12-03 DIAGNOSIS — E11319 Type 2 diabetes mellitus with unspecified diabetic retinopathy without macular edema: Secondary | ICD-10-CM | POA: Diagnosis not present

## 2019-12-03 DIAGNOSIS — N1832 Chronic kidney disease, stage 3b: Secondary | ICD-10-CM | POA: Diagnosis not present

## 2019-12-03 DIAGNOSIS — R8290 Unspecified abnormal findings in urine: Secondary | ICD-10-CM | POA: Diagnosis not present

## 2019-12-03 DIAGNOSIS — E1165 Type 2 diabetes mellitus with hyperglycemia: Secondary | ICD-10-CM | POA: Diagnosis not present

## 2019-12-03 DIAGNOSIS — E1022 Type 1 diabetes mellitus with diabetic chronic kidney disease: Secondary | ICD-10-CM | POA: Diagnosis not present

## 2019-12-28 ENCOUNTER — Other Ambulatory Visit: Payer: Self-pay | Admitting: Cardiovascular Disease

## 2020-01-29 ENCOUNTER — Other Ambulatory Visit: Payer: Self-pay

## 2020-01-29 ENCOUNTER — Ambulatory Visit (INDEPENDENT_AMBULATORY_CARE_PROVIDER_SITE_OTHER): Payer: Medicare Other | Admitting: Podiatry

## 2020-01-29 DIAGNOSIS — B351 Tinea unguium: Secondary | ICD-10-CM | POA: Diagnosis not present

## 2020-01-29 DIAGNOSIS — I739 Peripheral vascular disease, unspecified: Secondary | ICD-10-CM | POA: Diagnosis not present

## 2020-01-29 DIAGNOSIS — L600 Ingrowing nail: Secondary | ICD-10-CM

## 2020-01-29 DIAGNOSIS — Z7901 Long term (current) use of anticoagulants: Secondary | ICD-10-CM

## 2020-01-29 MED ORDER — CEPHALEXIN 500 MG PO CAPS: 500.0000 mg | ORAL_CAPSULE | Freq: Two times a day (BID) | ORAL | 0 refills | Status: DC

## 2020-02-05 NOTE — Progress Notes (Addendum)
Subjective: 70 year old female presents the office today for concerns of left big toenail pain, ingrown toenails with dystrophy to the toenail.  She has noticed discomfort of the nail as well as some swelling but no drainage or pus.  No red streaks.  She is diabetic her last A1c was 8.2.  She is currently on Eliquis as well. Denies any systemic complaints such as fevers, chills, nausea, vomiting. No acute changes since last appointment, and no other complaints at this time.   Objective: AAO x3, NAD DP/PT pulses palpable 1/4 bilaterally, CRT less than 3 seconds Incurvation present to the left hallux toenail with localized edema and faint erythema likely more from inflammation as opposed to infection.  No drainage or pus or ascending cellulitis. Nails hypertrophic, dystrophic with discoloration.  No hyperpigmented changes.  No open lesions today. No pain with calf compression, swelling, warmth, erythema  Assessment: Ingrown toenail, onychodystrophy  Plan: -All treatment options discussed with the patient including all alternatives, risks, complications.  -Sharply debrided the symptomatic portions without any  complications of bleeding.  Given pulses order arterial studies.  Prescribed Keflex.  Mupirocin ointment dressing changes daily.  Monitoring signs or symptoms of infection. -Patient encouraged to call the office with any questions, concerns, change in symptoms.   Trula Slade DPM

## 2020-02-06 ENCOUNTER — Other Ambulatory Visit: Payer: Self-pay | Admitting: Podiatry

## 2020-02-06 DIAGNOSIS — I739 Peripheral vascular disease, unspecified: Secondary | ICD-10-CM

## 2020-02-10 ENCOUNTER — Ambulatory Visit: Payer: Medicare Other | Admitting: Podiatry

## 2020-02-16 ENCOUNTER — Encounter (HOSPITAL_COMMUNITY): Payer: Medicare Other

## 2020-02-24 ENCOUNTER — Ambulatory Visit (HOSPITAL_COMMUNITY)
Admission: RE | Admit: 2020-02-24 | Discharge: 2020-02-24 | Disposition: A | Discharge status: Home / Self Care | Payer: Medicare Other | Source: Ambulatory Visit | Attending: Cardiovascular Disease | Admitting: Cardiovascular Disease

## 2020-02-24 ENCOUNTER — Other Ambulatory Visit: Payer: Self-pay

## 2020-02-24 DIAGNOSIS — I739 Peripheral vascular disease, unspecified: Secondary | ICD-10-CM | POA: Diagnosis not present

## 2020-03-04 DIAGNOSIS — E11319 Type 2 diabetes mellitus with unspecified diabetic retinopathy without macular edema: Secondary | ICD-10-CM | POA: Diagnosis not present

## 2020-03-04 DIAGNOSIS — E1042 Type 1 diabetes mellitus with diabetic polyneuropathy: Secondary | ICD-10-CM | POA: Diagnosis not present

## 2020-03-04 DIAGNOSIS — R8290 Unspecified abnormal findings in urine: Secondary | ICD-10-CM | POA: Diagnosis not present

## 2020-03-04 DIAGNOSIS — E1022 Type 1 diabetes mellitus with diabetic chronic kidney disease: Secondary | ICD-10-CM | POA: Diagnosis not present

## 2020-03-04 DIAGNOSIS — N1832 Chronic kidney disease, stage 3b: Secondary | ICD-10-CM | POA: Diagnosis not present

## 2020-03-04 DIAGNOSIS — Z794 Long term (current) use of insulin: Secondary | ICD-10-CM | POA: Diagnosis not present

## 2020-03-04 DIAGNOSIS — E039 Hypothyroidism, unspecified: Secondary | ICD-10-CM | POA: Diagnosis not present

## 2020-03-12 ENCOUNTER — Ambulatory Visit (INDEPENDENT_AMBULATORY_CARE_PROVIDER_SITE_OTHER): Payer: Medicare Other | Admitting: Cardiovascular Disease

## 2020-03-12 ENCOUNTER — Other Ambulatory Visit: Payer: Self-pay

## 2020-03-12 ENCOUNTER — Encounter: Payer: Self-pay | Admitting: Cardiovascular Disease

## 2020-03-12 VITALS — BP 112/72 | HR 88 | Ht 67.0 in | Wt 205.4 lb

## 2020-03-12 DIAGNOSIS — I484 Atypical atrial flutter: Secondary | ICD-10-CM | POA: Diagnosis not present

## 2020-03-12 DIAGNOSIS — I10 Essential (primary) hypertension: Secondary | ICD-10-CM | POA: Diagnosis not present

## 2020-03-12 DIAGNOSIS — I4819 Other persistent atrial fibrillation: Secondary | ICD-10-CM

## 2020-03-12 DIAGNOSIS — I5033 Acute on chronic diastolic (congestive) heart failure: Secondary | ICD-10-CM | POA: Diagnosis not present

## 2020-03-12 DIAGNOSIS — Z5181 Encounter for therapeutic drug level monitoring: Secondary | ICD-10-CM

## 2020-03-12 DIAGNOSIS — E781 Pure hyperglyceridemia: Secondary | ICD-10-CM

## 2020-03-12 DIAGNOSIS — I5032 Chronic diastolic (congestive) heart failure: Secondary | ICD-10-CM | POA: Insufficient documentation

## 2020-03-12 DIAGNOSIS — R5383 Other fatigue: Secondary | ICD-10-CM | POA: Diagnosis not present

## 2020-03-12 DIAGNOSIS — R0602 Shortness of breath: Secondary | ICD-10-CM

## 2020-03-12 DIAGNOSIS — I2510 Atherosclerotic heart disease of native coronary artery without angina pectoris: Secondary | ICD-10-CM | POA: Diagnosis not present

## 2020-03-12 HISTORY — DX: Atypical atrial flutter: I48.4

## 2020-03-12 HISTORY — DX: Acute on chronic diastolic (congestive) heart failure: I50.33

## 2020-03-12 HISTORY — DX: Chronic diastolic (congestive) heart failure: I50.32

## 2020-03-12 NOTE — Progress Notes (Signed)
Cardiology Office Note   Date:  03/12/2020   ID:  [name], DOB 07-27-1950, MRN [id number]  PCP:  Jonathon Jordan, MD  Cardiologist:  Lilli Light Oval Linsey, MD, [facility]  Electrophysiologist: Dr. Rayann Heman Nephrologist: Dr. Joelyn Oms  No chief complaint on file.    History of Present Illness: Courtney Pearson is a 70 y.o. female with persistent atrial fibrillation/flutter s/p ablation 12/2017 and DCCV, coronary calcification, hypertension, hyperlipidemia, and diabetes here for follow up.  [name] saw her endocrinologist 03/2017 and was noted to be in atrial flutter with ventricular rates in the 120s-140s.  Propranolol was switched to metoprolol and she was started on Eliquis.  She subsequently followed up in atrial fibrillation clinic and had an echo 04/12/2017 that revealed LVEF 55 to 60% with mildly elevated pulmonary pressures.  She was noted to have a small pericardial effusion but no evidence of tamponade.  She underwent cardioversion 04/2017 but had recurrent atrial for relation.  She wore an event monitor 05/2017 that showed she was in atrial fibrillation 50% of the time.  She also had a 3.3-second postconversion pause.  She was subsequently started on flecainide.  She has also been on amiodarone and failed multiple cardioversion.  She underwent atrial fibrillation ablation 12/2017.  She had recurrent atrial fibrillation and DCCV that was unsuccessful.  She had a consultation with Dr. Orvan Seen and is considering surgical ablation.  Prior to her ablation she had a preablation CT that had a calcium score 1712.  She had a [employer] 03/2019 that was negative for ischemia.  She feels her heart skipping around.  She gets short of breath very easily.  She notices it even walking form one side of the house to the other.  She had a consultation with Dr. Orvan Seen regarding MAZE.  She is concerned that the success rate may not be high and she doesn't want to go through it if it won't work.   At her  last appointment she was feeling poorly and her heart rate was initially elevated.  It improved with rest.  She was referred back to atrial fibrillation clinic and was advised to Tikosyn was an option.  She continues to have intermittent atrial fibrillation.  Her heart rate has been well-controlled.  When she checks on the Kardia device she is in atrial fibrillation about half the time.  She continues to complain of not having energy.  She is unsure how much of this is due to atrial fibrillation vs. Deconditioning and being out of shape. Losartan was reduced due to low BP.  She has been feeling tired and is frustrated that she is overweight.  Her heart rate has been well-controlled in general.  However, she wonders if the atrial fibrillation is causing her fatigue.  She also knows that she isn't doing much which she knows is contributing. She is also unsteady.  She gets short of breath walking to the mailbox so she can't imagine exercising more.  She has taken lasix every day this week. She has no orthopnea or PND.  She doesn't add salt to her diet but does have processed foods.  She had ABIs this week that revealed non-compressible vessels but normal flow.  Past Medical History:  Diagnosis Date  . Acute on chronic diastolic heart failure ([place]) 03/12/2020  . Atypical atrial flutter ([place]) 03/12/2020  . CAD in native artery 03/20/2019  . Diabetes ([place])   . HTN (hypertension)   . Hyperlipidemia   . Hypothyroidism   .  Palpitation   . Persistent atrial fibrillation ([place]) 04/03/2017  . Renal insufficiency     Past Surgical History:  Procedure Laterality Date  . ATRIAL FIBRILLATION ABLATION N/A 01/01/2018   Procedure: ATRIAL FIBRILLATION ABLATION;  Surgeon: Thompson Grayer, MD;  Location: [place] CV [facility];  Service: Cardiovascular;  Laterality: N/A;  . ATRIAL FIBRILLATION ABLATION N/A 10/31/2018   Procedure: ATRIAL FIBRILLATION ABLATION;  Surgeon: Thompson Grayer, MD;  Location: [place] CV [facility];   Service: Cardiovascular;  Laterality: N/A;  . BREAST BIOPSY    . CARDIOVERSION N/A 04/30/2017   Procedure: CARDIOVERSION;  Surgeon: [name], MD;  Location: [facility] ENDOSCOPY;  Service: Cardiovascular;  Laterality: N/A;  . CARDIOVERSION N/A 02/15/2018   Procedure: CARDIOVERSION;  Surgeon: [name], MD;  Location: [facility] ENDOSCOPY;  Service: Cardiovascular;  Laterality: N/A;  . CARDIOVERSION N/A 04/10/2018   Procedure: CARDIOVERSION;  Surgeon: Josue Hector, MD;  Location: [place];  Service: Cardiovascular;  Laterality: N/A;  . CESAREAN SECTION    . EYE SURGERY       Current Outpatient Medications  Medication Sig Dispense Refill  . acetaminophen (TYLENOL) 500 MG tablet Take 500 mg by mouth as needed for moderate pain or headache.     Marland Kitchen amoxicillin (AMOXIL) 500 MG capsule Take 4 capsules by mouth as needed. 1 hour prior to dental appointments    . apixaban (ELIQUIS) 5 MG TABS tablet Take 1 tablet (5 mg total) by mouth 2 (two) times daily. 180 tablet 2  . atorvastatin (LIPITOR) 10 MG tablet Take 10 mg by mouth every other day.     . bismuth subsalicylate (PEPTO BISMOL) 262 MG/15ML suspension Take 30 mLs by mouth as needed for indigestion.     . calcium carbonate (TUMS EX) 750 MG chewable tablet Chew 750 mg by mouth daily.     . cephALEXin (KEFLEX) 500 MG capsule Take 1 capsule (500 mg total) by mouth 2 (two) times daily. 14 capsule 0  . Cholecalciferol (VITAMIN D-3) 25 MCG (1000 UT) CAPS Take 1,000 Units by mouth daily.    . ciprofloxacin (CIPRO) 500 MG tablet Take 500 mg by mouth daily.    . fexofenadine (ALLEGRA) 180 MG tablet Take 180 mg by mouth as needed for allergies or rhinitis.     . furosemide (LASIX) 20 MG tablet TAKE 1 TABLET (20 MG TOTAL) BY MOUTH DAILY AS NEEDED FOR FLUID. (Patient taking differently: Take 20 mg by mouth as needed for fluid.) 90 tablet 1  . hydrocortisone cream 1 % Apply 1 application topically daily as needed for itching.    . insulin aspart  (NOVOLOG) 100 UNIT/ML injection Inject 25 Units into the skin daily. Via insulin pump    . levothyroxine (SYNTHROID) 88 MCG tablet Take 88 mcg by mouth daily.     Marland Kitchen loperamide (IMODIUM A-D) 2 MG tablet Take 2-4 mg by mouth as needed for diarrhea or loose stools.     Marland Kitchen losartan (COZAAR) 25 MG tablet TAKE 1/2 TABLET TWICE A DAY 90 tablet 3  . metoprolol tartrate (LOPRESSOR) 25 MG tablet TAKE 1/2 TABLET (12.5 MG TOTAL) BY MOUTH 2 TIMES DAILY. 90 tablet 3  . Multiple Vitamins-Minerals (MULTIVITAMIN ADULT PO) Take 1 tablet by mouth daily.    . potassium chloride (KLOR-CON) 10 MEQ tablet Take 10 mEq by mouth daily.     No current facility-administered medications for this visit.    Allergies:   5-alpha reductase inhibitors    Social History:  The patient  reports that she  has never smoked. She has never used smokeless tobacco. She reports that she does not drink alcohol.   Family History:  The patient's family history includes Anemia in her father; Asthma in her sister; Heart disease in an other family member; Hypertension in her mother; Leukemia in her brother; Lupus in her child; OCD in her sister; Osteoarthritis in her mother and sister; Osteoporosis in her mother.    ROS:  Please see the history of present illness.   Otherwise, review of systems are positive for none.   All other systems are reviewed and negative.    PHYSICAL EXAM: VS:  BP 112/72 (BP Location: Left Arm, Patient Position: Sitting)   Pulse 88   Ht 5\' 7"  (1.702 m)   Wt 205 lb 6.4 oz (93.2 kg)   SpO2 99%   BMI 32.17 kg/m  , BMI Body mass index is 32.17 kg/m. GENERAL:  Well appearing HEENT: Pupils equal round and reactive, fundi not visualized, oral mucosa unremarkable NECK: Jugular venous distention 4 cm above clavicle at 45 degrees, waveform within normal limits, carotid upstroke brisk and symmetric, no bruits LUNGS:  Clear to auscultation bilaterally HEART: Regular  PMI not displaced or sustained,S1 and S2 within  normal limits, no S3, no S4, no clicks, no rubs, no murmurs ABD:  Flat, positive bowel sounds normal in frequency in pitch, no bruits, no rebound, no guarding, no midline pulsatile mass, no hepatomegaly, no splenomegaly EXT:  2 plus pulses throughout, 2+ bilateral edema, no cyanosis no clubbing SKIN:  No rashes no nodules NEURO:  Cranial nerves II through XII grossly intact, motor grossly intact throughout PSYCH:  Cognitively intact, oriented to person place and time  EKG:  EKG is ordered today. The ekg ordered 06/27/19 demonstrates atrial flutter.  Rate 70 bpm. 03/12/20: Atrial flutter.  Rate 88 bpm.  LAFB.    Echo 04/12/17: Study Conclusions  - Left ventricle: The cavity size was normal. Wall thickness was   normal. Systolic function was normal. The estimated ejection   fraction was in the range of 55% to 60%. Wall motion was normal;   there were no regional wall motion abnormalities. The study is   not technically sufficient to allow evaluation of LV diastolic   function. - Aortic valve: Trileaflet; mildly thickened, mildly calcified   leaflets. - Mitral valve: Calcified annulus. Mildly thickened leaflets . - Pulmonary arteries: Systolic pressure was mildly to moderately   increased. PA peak pressure: 46 mm Hg (S). - Pericardium, extracardiac: A small pericardial effusion was   identified circumferential to the heart, mostly along the   posterior and right ventricular free wall. There was no evidence   of hemodynamic compromise.  14 Day Event Monitor 06/07/17:  Quality: Fair.  Baseline artifact. Predominant rhythm: atrial fibrillation 58%.  Rates up to 110 bpm. Average heart rate: 77 bpm 3.3 second post conversion pause noted. PACs and PVCs, atrial bigeminy noted   Pre-ablation cardiac CT 10/25/18: IMPRESSION: 1. Single left pulmonary vein with measurements as above. Ostial diameter measurements noted to be similar to CTA dated 12/25/2017, however area measurements noted to  be smaller on this study for RUPV and left common trunk. 2. There is no thrombus in the left atrial appendage. 3. The esophagus runs in close proximity to the RUPV ostium. 4. Severe biatrial enlargement. 5. 3 vessel calcified atherosclerosis. Coronary calcium score of 1712.  Lexiscan Myoview 04/01/19:  There was no ST segment deviation noted during stress.  The study is normal.  This is  a low risk study with no evidence of ischemia.  Systolic function is not assessed due to atrial fibrillation.   Recent Labs: No results found for requested labs within last 8760 hours.   12/11/16: Sodium 140, potassium 4.4, BUN 26, creatinine 1.19 AST 22, ALT 18 Hemoglobin A1c 8.2% Total cholesterol 143, triglycerides 54, HDL 62, LDL 71   Lipid Panel No results found for: CHOL, TRIG, HDL, CHOLHDL, VLDL, LDLCALC, LDLDIRECT    Wt Readings from Last 3 Encounters:  03/12/20 205 lb 6.4 oz (93.2 kg)  09/12/19 203 lb (92.1 kg)  07/04/19 201 lb 12.8 oz (91.5 kg)      ASSESSMENT AND PLAN:  # Fatigue: # Shortness of breath: # Acute on chronic diastolic heart failure:  She is very volume overloaded today.  We will increase her Lasix to 40 mg twice daily for 3 days.  While she is on this dose she will need potassium chloride 20 mEq.  After that she will increase Lasix to 40 mEq daily.  Check a BMP, BNP, and CBC in a week.   # Persistent atrial fibrillation/flutter:  Heart rates are better controlled.  She is unsure how much atrial fibrillation is contributing to her fatigue.  Right now she is not sure that she wants to get the Maze procedure.  Continue Eliquis and metoprolol.   # Coronary calcification:  # Hyperlipidemia:  Lexiscan Myoview was negative for ischemia 03/2019.  Continue Eliquis, atorvastatin, and metoprolol.  LDL was 50 on 05/2019.  # Hypertension: Blood pressure stable.  Continue losartan and metoprolol.   Current medicines are reviewed at length with the patient today.  The  patient does not have concerns regarding medicines.  The following changes have been made: none  Labs/ tests ordered today include:   Orders Placed This Encounter  Procedures  . CBC with Differential/Platelet  . Basic metabolic panel  . B Nat Peptide  . EKG 12-Lead    Disposition:   FU with Colm Lyford C. Oval Linsey, MD, [facility] in 2 months.  APP in 2 weeks.   Signed, Yesena Reaves C. Oval Linsey, MD, [facility]  03/12/2020 12:36 PM    [facility]

## 2020-03-12 NOTE — Patient Instructions (Addendum)
Medication Instructions:  TAKE FUROSEMIDE 20 MG 2 TABLETS TWICE A DAY FOR 3 DAYS, THEN DECREASE TO 2 TABLETS DAILY   TAKE POTASSIUM 10 MEQ 2 TABLETS TWICE A DAY FOR 3 DAYS AND THEN 1 DAILY   *If you need a refill on your cardiac medications before your next appointment, please call your pharmacy*  Lab Work: BMET/CBC/BNP IN 1 WEEK   If you have labs (blood work) drawn today and your tests are completely normal, you will receive your results only by: Marland Kitchen MyChart Message (if you have MyChart) OR . A paper copy in the mail If you have any lab test that is abnormal or we need to change your treatment, we will call you to review the results.  Testing/Procedures: NONE   Follow-Up: At [facility], you and your health needs are our priority.  As part of our continuing mission to provide you with exceptional heart care, we have created designated Provider Care Teams.  These Care Teams include your primary Cardiologist (physician) and Advanced Practice Providers (APPs -  Physician Assistants and Nurse Practitioners) who all work together to provide you with the care you need, when you need it.  We recommend signing up for the patient portal called "MyChart".  Sign up information is provided on this After Visit Summary.  MyChart is used to connect with patients for Virtual Visits (Telemedicine).  Patients are able to view lab/test results, encounter notes, upcoming appointments, etc.  Non-urgent messages can be sent to your provider as well.   To learn more about what you can do with MyChart, go to [email or web address].    Your next appointment:   03/26/2020 AT 3:45 PM IN PERSON WITH JESSE C NP

## 2020-03-19 DIAGNOSIS — R0602 Shortness of breath: Secondary | ICD-10-CM | POA: Diagnosis not present

## 2020-03-19 DIAGNOSIS — Z5181 Encounter for therapeutic drug level monitoring: Secondary | ICD-10-CM | POA: Diagnosis not present

## 2020-03-19 DIAGNOSIS — R5383 Other fatigue: Secondary | ICD-10-CM | POA: Diagnosis not present

## 2020-03-20 LAB — BASIC METABOLIC PANEL
BUN/Creatinine Ratio: 26 (ref 12–28)
BUN: 41 mg/dL — ABNORMAL HIGH (ref 8–27)
CO2: 23 mmol/L (ref 20–29)
Calcium: 10.1 mg/dL (ref 8.7–10.3)
Chloride: 100 mmol/L (ref 96–106)
Creatinine, Ser: 1.57 mg/dL — ABNORMAL HIGH (ref 0.57–1.00)
GFR calc Af Amer: 38 mL/min/{1.73_m2} — ABNORMAL LOW (ref >59)
GFR calc non Af Amer: 33 mL/min/{1.73_m2} — ABNORMAL LOW (ref >59)
Glucose: 286 mg/dL — ABNORMAL HIGH (ref 65–99)
Potassium: 5.1 mmol/L (ref 3.5–5.2)
Sodium: 140 mmol/L (ref 134–144)

## 2020-03-20 LAB — CBC WITH DIFFERENTIAL/PLATELET
Basophils Absolute: 0 10*3/uL (ref 0.0–0.2)
Basos: 1 %
EOS (ABSOLUTE): 0.2 10*3/uL (ref 0.0–0.4)
Eos: 3 %
Hematocrit: 40.1 % (ref 34.0–46.6)
Hemoglobin: 12.8 g/dL (ref 11.1–15.9)
Immature Grans (Abs): 0 10*3/uL (ref 0.0–0.1)
Immature Granulocytes: 0 %
Lymphocytes Absolute: 0.8 10*3/uL (ref 0.7–3.1)
Lymphs: 16 %
MCH: 30.3 pg (ref 26.6–33.0)
MCHC: 31.9 g/dL (ref 31.5–35.7)
MCV: 95 fL (ref 79–97)
Monocytes Absolute: 0.4 10*3/uL (ref 0.1–0.9)
Monocytes: 8 %
Neutrophils Absolute: 3.6 10*3/uL (ref 1.4–7.0)
Neutrophils: 72 %
Platelets: 192 10*3/uL (ref 150–450)
RBC: 4.22 x10E6/uL (ref 3.77–5.28)
RDW: 12.6 % (ref 11.7–15.4)
WBC: 5 10*3/uL (ref 3.4–10.8)

## 2020-03-20 LAB — BRAIN NATRIURETIC PEPTIDE: BNP: 112 pg/mL — ABNORMAL HIGH (ref 0.0–100.0)

## 2020-03-25 NOTE — Progress Notes (Signed)
Cardiology Clinic Note   Patient Name: [name] Date of Encounter: 03/26/2020  Primary Care Provider:  Jonathon Jordan, MD Primary Cardiologist:  Courtney Latch, MD  Patient Profile    Courtney Pearson 70 year old female presents the clinic today for follow-up of her shortness of breath.  Past Medical History    Past Medical History:  Diagnosis Date  . Acute on chronic diastolic heart failure ([place]) 03/12/2020  . Atypical atrial flutter ([place]) 03/12/2020  . CAD in native artery 03/20/2019  . Diabetes ([place])   . HTN (hypertension)   . Hyperlipidemia   . Hypothyroidism   . Palpitation   . Persistent atrial fibrillation ([place]) 04/03/2017  . Renal insufficiency    Past Surgical History:  Procedure Laterality Date  . ATRIAL FIBRILLATION ABLATION N/A 01/01/2018   Procedure: ATRIAL FIBRILLATION ABLATION;  Surgeon: Courtney Grayer, MD;  Location: [place] [facility];  Service: Cardiovascular;  Laterality: N/A;  . ATRIAL FIBRILLATION ABLATION N/A 10/31/2018   Procedure: ATRIAL FIBRILLATION ABLATION;  Surgeon: Courtney Grayer, MD;  Location: [place] [facility];  Service: Cardiovascular;  Laterality: N/A;  . BREAST BIOPSY    . CARDIOVERSION N/A 04/30/2017   Procedure: CARDIOVERSION;  Surgeon: Courtney Latch, MD;  Location: [facility] ENDOSCOPY;  Service: Cardiovascular;  Laterality: N/A;  . CARDIOVERSION N/A 02/15/2018   Procedure: CARDIOVERSION;  Surgeon: Courtney Latch, MD;  Location: [facility] ENDOSCOPY;  Service: Cardiovascular;  Laterality: N/A;  . CARDIOVERSION N/A 04/10/2018   Procedure: CARDIOVERSION;  Surgeon: Courtney Hector, MD;  Location: [facility];  Service: Cardiovascular;  Laterality: N/A;  . CESAREAN SECTION    . EYE SURGERY      Allergies  Allergies  Allergen Reactions  . 5-Alpha Reductase Inhibitors     History of Present Illness    Courtney Pearson has a PMH of atrial fibrillation/flutter status post ablation 12/19 and DCCV, coronary calcification, HTN, HLD, and  diabetes.  She was Pearson and evaluated by her endocrinologist 3/19.  During that time she was noted to be in atrial flutter with ventricular rates in the 120s-140s.  Her propranolol was switched to metoprolol and she was started on Eliquis.  She followed up in the atrial fibrillation clinic.  She had an echocardiogram 3/19 that showed LVEF of 55-60% with mildly elevated pulmonary pressures.  She was noted to have some pericardial effusion but no evidence of tamponade.  She underwent cardioversion 4/19 had recurrent atrial fibrillation.  She wore a cardiac event monitor 5/19 that showed she was in atrial fibrillation 50% of the time.  She also had 3.3-second post cardioversion pause.  She was subsequently started on flecainide.  She has also trialed amiodarone and failed cardioversion multiple times.  She underwent A. fib ablation 12/19.  She had recurrent atrial fibrillation and DCCV that was unsuccessful.  She had a consult with Dr. Julien Pearson for consideration of surgical ablation.  Prior to her ablation she had a prior ablation CT that showed a calcium score of 1712.  Her nuclear stress test 3/21 was negative for ischemia.  Mild she reported becoming short of breath very easily.  She notices shortness of breath even when walking around her house.  She also had a consultation with Dr. Orvan Pearson regarding maze procedure.  She was concerned about the success rate and did not want to go through the procedure if it was not going to work.  She was Pearson in follow-up by Dr. Oval Pearson on 03/12/2020.  She reported that she had been feeling tired frustrated  that she was overweight.  Her heart rate had been fairly well controlled.  She wondered if her atrial fibrillation was causing her fatigue.  She also reported that she was not very physically active.  She did have complaints of unsteadiness on her feet.  She reported getting short of breath with walking to the mailbox and did not feel she have enough stamina for exercise.  She  has been taking furosemide daily x1 week.  She denied orthopnea and PND.  She has been following a low-salt diet but was eating processed foods.  She underwent lower extremity Dopplers/ABIs that showed noncompressible veins with normal flow.  Her Lasix was increased to 40 mg twice daily x3 days.  She was also given supplemental potassium.  Plan for repeat BMP, BNP, and CBC in a week.  She presents the clinic today for follow-up evaluation states she has noticed that over the last few days she has been feeling better.  She notices more energy.  She feels more motivated to do activities.  She also feels that her breathing is somewhat better.  She recently went out to the mail and felt that her blood pressure/pulse were elevated.  She used her home cardiac monitor to assess her pulse and was pleased to find that it was in the 70s.  I asked her about daily weighing she said that she typically weighs every other day.  We talked about the importance of contacting the office with a weight increase of 3 pounds overnight or 5 pounds in 1 week.  She reports that she will obtain a digital scale and begin walking every day.  I will give her the salty 6 diet sheet, have her increase her physical activity as tolerated and follow-up in 3 months.  I will also give her the [place] support stocking sheet.  Today she denies chest pain, shortness of breath, lower extremity edema, fatigue, palpitations, melena, hematuria, hemoptysis, diaphoresis, weakness, presyncope, syncope, orthopnea, and PND.   Home Medications    Prior to Admission medications   Medication Sig Start Date End Date Taking? Authorizing Provider  acetaminophen (TYLENOL) 500 MG tablet Take 500 mg by mouth as needed for moderate pain or headache.     [provider]  amoxicillin (AMOXIL) 500 MG capsule Take 4 capsules by mouth as needed. 1 hour prior to dental appointments 10/08/18   [provider]  apixaban (ELIQUIS) 5 MG TABS tablet  Take 1 tablet (5 mg total) by mouth 2 (two) times daily. 11/07/19   Courtney Needs, NP  atorvastatin (LIPITOR) 10 MG tablet Take 10 mg by mouth every other day.  09/20/12   [provider]  bismuth subsalicylate (PEPTO BISMOL) 262 MG/15ML suspension Take 30 mLs by mouth as needed for indigestion.     [provider]  calcium carbonate (TUMS EX) 750 MG chewable tablet Chew 750 mg by mouth daily.     [provider]  cephALEXin (KEFLEX) 500 MG capsule Take 1 capsule (500 mg total) by mouth 2 (two) times daily. 01/29/20   Trula Slade, DPM  Cholecalciferol (VITAMIN D-3) 25 MCG (1000 UT) CAPS Take 1,000 Units by mouth daily.    [provider]  ciprofloxacin (CIPRO) 500 MG tablet Take 500 mg by mouth daily. 03/11/20   [provider]  fexofenadine (ALLEGRA) 180 MG tablet Take 180 mg by mouth as needed for allergies or rhinitis.     [provider]  furosemide (LASIX) 20 MG tablet Take 20 mg  by mouth as directed. TAKE 2 TABLETS DAILY    [provider]  hydrocortisone cream 1 % Apply 1 application topically daily as needed for itching.    [provider]  insulin aspart (NOVOLOG) 100 UNIT/ML injection Inject 25 Units into the skin daily. Via insulin pump    [provider]  levothyroxine (SYNTHROID) 88 MCG tablet Take 88 mcg by mouth daily.  12/15/18   [provider]  loperamide (IMODIUM A-D) 2 MG tablet Take 2-4 mg by mouth as needed for diarrhea or loose stools.     [provider]  losartan (COZAAR) 25 MG tablet TAKE 1/2 TABLET TWICE A DAY 09/12/19   Courtney Latch, MD  metoprolol tartrate (LOPRESSOR) 25 MG tablet TAKE 1/2 TABLET (12.5 MG TOTAL) BY MOUTH 2 TIMES DAILY. 12/30/19   Troy Sine, MD  Multiple Vitamins-Minerals (MULTIVITAMIN ADULT PO) Take 1 tablet by mouth daily.    [provider]  potassium chloride (KLOR-CON) 10 MEQ tablet Take 10 mEq by mouth daily.    [provider]    Family History    Family History  Problem Relation Age of Onset  . Heart disease Other        No family history  . Hypertension Mother   . Osteoarthritis Mother   . Osteoporosis Mother   . Anemia Father   . Asthma Sister   . Osteoarthritis Sister   . OCD Sister   . Leukemia Brother   . Lupus Child    She indicated that her mother is deceased. She indicated that her father is deceased. She indicated that her sister is alive. She indicated that both of her brothers are alive. She indicated that the status of her child is unknown. She indicated that the status of her other is unknown.  Social History    Social History   Socioeconomic History  . Marital status: Married    Spouse name: Not on file  . Number of children: 2  . Years of education: Not on file  . Highest education level: Not on file  Occupational History  . Not on file  Tobacco Use  . Smoking status: Never Smoker  . Smokeless tobacco: Never Used  Vaping Use  . Vaping Use: Never used  Substance and Sexual Activity  . Alcohol use: No  . Drug use: Not on file  . Sexual activity: Not on file  Other Topics Concern  . Not on file  Social History Narrative  . Not on file   Social Determinants of Health   Financial Resource Strain: Not on file  Food Insecurity: Not on file  Transportation Pearson: Not on file  Physical Activity: Not on file  Stress: Not on file  Social Connections: Not on file  Intimate Partner Violence: Not on file     Review of Systems    General:  No chills, fever, night sweats or weight changes.  Cardiovascular:  No chest pain, dyspnea on exertion, edema, orthopnea, palpitations, paroxysmal nocturnal dyspnea. Dermatological: No rash, lesions/masses Respiratory: No cough, dyspnea Urologic: No hematuria, dysuria Abdominal:   No nausea, vomiting, diarrhea, bright red blood per rectum, melena, or hematemesis Neurologic:  No visual changes, wkns, changes in mental  status. All other systems reviewed and are otherwise negative except as noted above.  Physical Exam    VS:  BP 110/82 (BP Location: Left Arm, Patient Position: Sitting, Cuff Size: Normal)   Pulse 70   Wt 201 lb (91.2 kg)  SpO2 98%   BMI 31.48 kg/m  , BMI Body mass index is 31.48 kg/m. GEN: Well nourished, well developed, in no acute distress. HEENT: normal. Neck: Supple, no JVD, carotid bruits, or masses. Cardiac: RRR, no murmurs, rubs, or gallops. No clubbing, cyanosis, generalized nonpitting bilateral lower extremity edema.  Radials/DP/PT 2+ and equal bilaterally.  Respiratory:  Respirations regular and unlabored, clear to auscultation bilaterally. GI: Soft, nontender, nondistended, BS + x 4. MS: no deformity or atrophy. Skin: warm and dry, no rash. Neuro:  Strength and sensation are intact. Psych: Normal affect.  Accessory Clinical Findings    Recent Labs: 03/19/2020: BNP 112.0; BUN 41; Creatinine, Ser 1.57; Hemoglobin 12.8; Platelets 192; Potassium 5.1; Sodium 140   Recent Lipid Panel No results found for: CHOL, TRIG, HDL, CHOLHDL, VLDL, LDLCALC, LDLDIRECT  ECG personally reviewed by me today-none today.  Echocardiogram 01/01/2019 IMPRESSIONS    1. Left ventricular ejection fraction, by visual estimation, is 55 to  60%. The left ventricle has normal function. There is mildly increased  left ventricular hypertrophy.  2. Elevated left ventricular end-diastolic pressure.  3. Left ventricular diastolic parameters are indeterminate.  4. Global right ventricle has normal systolic function.The right  ventricular size is normal. No increase in right ventricular wall  thickness.  5. Left atrial size was mildly dilated.  6. Right atrial size was mildly dilated.  7. Moderate mitral annular calcification.  8. The mitral valve is abnormal in structure. Trivial mitral valve  regurgitation. Mitral valve mean diastolic gradient is 5 mmHg, heart rate  variable.  9. The  tricuspid valve is normal in structure. Tricuspid valve  regurgitation mild-moderate.  10. The aortic valve is tricuspid. Aortic valve regurgitation is not  visualized. No evidence of aortic valve sclerosis or stenosis.  11. The pulmonic valve was normal in structure. Pulmonic valve  regurgitation is trivial.  12. Mildly elevated pulmonary artery systolic pressure.  13. The inferior vena cava is normal in size with <50% respiratory  variability, suggesting right atrial pressure of 8 mmHg.  14. Small pericardial effusion.  15. The pericardial effusion is posterior to the left ventricle.   Assessment & Plan   1.  Acute on chronic diastolic CHF/fatigue-no increased work of breathing today.  Feels much better after increased diuresis.  Weight today 201.  Echocardiogram 12/20 showed normal EF, intermediate diastolic parameters, and mild LVH. Continue furosemide, metoprolol Heart healthy low-sodium diet-salty 6 given Increase physical activity as tolerated Lower extremity support stockings-[place] support stocking sheet given Contact office with a weight increase of 3 pounds overnight or 5 pounds in 1 week  Persistent atrial fibrillation/flutter-heart rate today 70.  Wishes to defer Maze procedure at this time. Continue apixaban, metoprolol Heart healthy low-sodium diet-salty 6 given Increase physical activity as tolerated Avoid triggers caffeine, chocolate, EtOH, dehydration etc.  Essential hypertension-BP today 110/82.  Well-controlled at home. Continue losartan, metoprolol Heart healthy low-sodium diet-salty 6 given Increase physical activity as tolerated  Hyperlipidemia-LDL 50 on 06/12/2019 Continue atorvastatin Heart healthy low-sodium high-fiber diet  Coronary calcification-no chest pain today.  Nuclear stress test negative for ischemia 3/21. Continue apixaban, metoprolol Increase physical activity as tolerated  Disposition: Follow-up with Dr. Oval Pearson or me in 3  month.   Jossie Ng. Akia Desroches NP-C    03/26/2020, 4:27 PM [address] Office [phone] Fax [phone]  Notice: This dictation was prepared with Dragon dictation along with smaller phrase technology. Any transcriptional errors that result from this process are unintentional  and may not be corrected upon review.  I spent 15 minutes examining this patient, reviewing medications, and using patient centered shared decision making involving her cardiac care.  Prior to her visit I spent greater than 20 minutes reviewing her past medical history,  medications, and prior cardiac tests.

## 2020-03-26 ENCOUNTER — Ambulatory Visit (INDEPENDENT_AMBULATORY_CARE_PROVIDER_SITE_OTHER): Payer: Medicare Other | Admitting: General Practice

## 2020-03-26 ENCOUNTER — Other Ambulatory Visit: Payer: Self-pay

## 2020-03-26 ENCOUNTER — Encounter: Payer: Self-pay | Admitting: General Practice

## 2020-03-26 VITALS — BP 110/82 | HR 70 | Wt 201.0 lb

## 2020-03-26 DIAGNOSIS — I4819 Other persistent atrial fibrillation: Secondary | ICD-10-CM

## 2020-03-26 DIAGNOSIS — I10 Essential (primary) hypertension: Secondary | ICD-10-CM

## 2020-03-26 DIAGNOSIS — R5383 Other fatigue: Secondary | ICD-10-CM

## 2020-03-26 DIAGNOSIS — E781 Pure hyperglyceridemia: Secondary | ICD-10-CM

## 2020-03-26 DIAGNOSIS — I5033 Acute on chronic diastolic (congestive) heart failure: Secondary | ICD-10-CM | POA: Diagnosis not present

## 2020-03-26 DIAGNOSIS — I2584 Coronary atherosclerosis due to calcified coronary lesion: Secondary | ICD-10-CM | POA: Diagnosis not present

## 2020-03-26 DIAGNOSIS — I2510 Atherosclerotic heart disease of native coronary artery without angina pectoris: Secondary | ICD-10-CM | POA: Diagnosis not present

## 2020-03-26 NOTE — Patient Instructions (Signed)
Medication Instructions:  TAKE FUROSEMIDE(LASIX) 40MG  DAILY *If you need a refill on your cardiac medications before your next appointment, please call your pharmacy*  Lab Work:   Testing/Procedures:  NONE    NONE  Special Instructions TAKE AND LOG YOUR WEIGHT DAILY. CALL IF YOU GAIN 3lbs/DAY -OR- 5lbs WEEKLY.  PLEASE READ AND FOLLOW SALTY 6-ATTACHED-1,800mg  daily  PLEASE INCREASE PHYSICAL ACTIVITY AS TOLERATED  PLEASE PURCHASE AND WEAR COMPRESSION STOCKINGS DAILY AND TAKE OFF AT BEDTIME. Compression stockings are elastic socks that squeeze the legs. They help to increase blood flow to the legs and to decrease swelling in the legs from fluid retention, and reduce the chance of developing blood clots in the lower legs. Please put on in the AM when dressing and off at night when dressing for bed.  LET THEM KNOW THAT YOU NEED KNEE HIGH'S WITH COMPRESSION OF 15-20 mmhg. [employer]  [place] ([place] [phone]); [place],  [phone]; [phone]  EMAIL   [email or web address].  PLEASE MAKE SURE TO ELEVATE YOUR FEET & LEGS WHILE SITTING, THIS WILL HELP WITH THE SWELLING ALSO.   Follow-Up: Your next appointment:  3 month(s) In Person with [name], MD OR IF UNAVAILABLE [place], FNP-C  At [facility], you and your health needs are our priority.  As part of our continuing mission to provide you with exceptional heart care, we have created designated Provider Care Teams.  These Care Teams include your primary Cardiologist (physician) and Advanced Practice Providers (APPs -  Physician Assistants and Nurse Practitioners) who all work together to provide you with the care you need, when you need it.            6 SALTY THINGS TO AVOID     1,800MG  DAILY

## 2020-03-30 ENCOUNTER — Other Ambulatory Visit (HOSPITAL_COMMUNITY): Payer: Self-pay | Admitting: Nurse Practitioner

## 2020-04-16 DIAGNOSIS — E1029 Type 1 diabetes mellitus with other diabetic kidney complication: Secondary | ICD-10-CM | POA: Diagnosis not present

## 2020-04-16 DIAGNOSIS — R8290 Unspecified abnormal findings in urine: Secondary | ICD-10-CM | POA: Diagnosis not present

## 2020-04-16 DIAGNOSIS — R809 Proteinuria, unspecified: Secondary | ICD-10-CM | POA: Diagnosis not present

## 2020-04-16 DIAGNOSIS — I129 Hypertensive chronic kidney disease with stage 1 through stage 4 chronic kidney disease, or unspecified chronic kidney disease: Secondary | ICD-10-CM | POA: Diagnosis not present

## 2020-04-16 DIAGNOSIS — N183 Chronic kidney disease, stage 3 unspecified: Secondary | ICD-10-CM | POA: Diagnosis not present

## 2020-05-07 ENCOUNTER — Encounter (HOSPITAL_COMMUNITY): Payer: Self-pay | Admitting: Emergency Medicine

## 2020-05-07 ENCOUNTER — Emergency Department (HOSPITAL_COMMUNITY): Payer: Medicare Other

## 2020-05-07 ENCOUNTER — Emergency Department (HOSPITAL_COMMUNITY)
Admission: EM | Admit: 2020-05-07 | Discharge: 2020-05-07 | Disposition: A | Discharge status: Home / Self Care | Payer: Medicare Other | Attending: Emergency Medicine | Admitting: Emergency Medicine

## 2020-05-07 DIAGNOSIS — E039 Hypothyroidism, unspecified: Secondary | ICD-10-CM | POA: Insufficient documentation

## 2020-05-07 DIAGNOSIS — I4891 Unspecified atrial fibrillation: Secondary | ICD-10-CM | POA: Diagnosis not present

## 2020-05-07 DIAGNOSIS — R110 Nausea: Secondary | ICD-10-CM | POA: Diagnosis not present

## 2020-05-07 DIAGNOSIS — R000 Tachycardia, unspecified: Secondary | ICD-10-CM | POA: Diagnosis not present

## 2020-05-07 DIAGNOSIS — N390 Urinary tract infection, site not specified: Secondary | ICD-10-CM | POA: Insufficient documentation

## 2020-05-07 DIAGNOSIS — Z794 Long term (current) use of insulin: Secondary | ICD-10-CM | POA: Insufficient documentation

## 2020-05-07 DIAGNOSIS — Z7901 Long term (current) use of anticoagulants: Secondary | ICD-10-CM | POA: Insufficient documentation

## 2020-05-07 DIAGNOSIS — Z79899 Other long term (current) drug therapy: Secondary | ICD-10-CM | POA: Insufficient documentation

## 2020-05-07 DIAGNOSIS — I2510 Atherosclerotic heart disease of native coronary artery without angina pectoris: Secondary | ICD-10-CM | POA: Diagnosis not present

## 2020-05-07 DIAGNOSIS — E119 Type 2 diabetes mellitus without complications: Secondary | ICD-10-CM | POA: Insufficient documentation

## 2020-05-07 DIAGNOSIS — R109 Unspecified abdominal pain: Secondary | ICD-10-CM | POA: Diagnosis not present

## 2020-05-07 DIAGNOSIS — I4892 Unspecified atrial flutter: Secondary | ICD-10-CM | POA: Insufficient documentation

## 2020-05-07 DIAGNOSIS — I110 Hypertensive heart disease with heart failure: Secondary | ICD-10-CM | POA: Insufficient documentation

## 2020-05-07 DIAGNOSIS — I5033 Acute on chronic diastolic (congestive) heart failure: Secondary | ICD-10-CM | POA: Insufficient documentation

## 2020-05-07 DIAGNOSIS — N12 Tubulo-interstitial nephritis, not specified as acute or chronic: Secondary | ICD-10-CM

## 2020-05-07 DIAGNOSIS — N10 Acute tubulo-interstitial nephritis: Secondary | ICD-10-CM | POA: Diagnosis not present

## 2020-05-07 DIAGNOSIS — I10 Essential (primary) hypertension: Secondary | ICD-10-CM | POA: Diagnosis not present

## 2020-05-07 DIAGNOSIS — R1030 Lower abdominal pain, unspecified: Secondary | ICD-10-CM | POA: Diagnosis present

## 2020-05-07 LAB — CBC
HCT: 36.7 % (ref 36.0–46.0)
Hemoglobin: 11.9 g/dL — ABNORMAL LOW (ref 12.0–15.0)
MCH: 30.5 pg (ref 26.0–34.0)
MCHC: 32.4 g/dL (ref 30.0–36.0)
MCV: 94.1 fL (ref 80.0–100.0)
Platelets: 171 10*3/uL (ref 150–400)
RBC: 3.9 MIL/uL (ref 3.87–5.11)
RDW: 13.5 % (ref 11.5–15.5)
WBC: 8.8 10*3/uL (ref 4.0–10.5)
nRBC: 0 % (ref 0.0–0.2)

## 2020-05-07 LAB — COMPREHENSIVE METABOLIC PANEL
ALT: 16 U/L (ref 0–44)
AST: 20 U/L (ref 15–41)
Albumin: 3.8 g/dL (ref 3.5–5.0)
Alkaline Phosphatase: 150 U/L — ABNORMAL HIGH (ref 38–126)
Anion gap: 7 (ref 5–15)
BUN: 29 mg/dL — ABNORMAL HIGH (ref 8–23)
CO2: 25 mmol/L (ref 22–32)
Calcium: 9.2 mg/dL (ref 8.9–10.3)
Chloride: 107 mmol/L (ref 98–111)
Creatinine, Ser: 1.46 mg/dL — ABNORMAL HIGH (ref 0.44–1.00)
GFR, Estimated: 38 mL/min — ABNORMAL LOW (ref >60)
Glucose, Bld: 193 mg/dL — ABNORMAL HIGH (ref 70–99)
Potassium: 3.7 mmol/L (ref 3.5–5.1)
Sodium: 139 mmol/L (ref 135–145)
Total Bilirubin: 1 mg/dL (ref 0.3–1.2)
Total Protein: 7 g/dL (ref 6.5–8.1)

## 2020-05-07 LAB — URINALYSIS, ROUTINE W REFLEX MICROSCOPIC
Bilirubin Urine: NEGATIVE
Glucose, UA: 50 mg/dL — AB
Ketones, ur: 5 mg/dL — AB
Nitrite: POSITIVE — AB
Protein, ur: 30 mg/dL — AB
RBC / HPF: >50 RBC/hpf — ABNORMAL HIGH (ref 0–5)
Specific Gravity, Urine: 1.01 (ref 1.005–1.030)
WBC, UA: >50 WBC/hpf — ABNORMAL HIGH (ref 0–5)
pH: 7 (ref 5.0–8.0)

## 2020-05-07 LAB — LIPASE, BLOOD: Lipase: 23 U/L (ref 11–51)

## 2020-05-07 MED ORDER — SODIUM CHLORIDE 0.9 % IV SOLN
1.0000 g | Freq: Once | INTRAVENOUS | Status: AC
  Administered 2020-05-07: 1 g via INTRAVENOUS
  Filled 2020-05-07: qty 10

## 2020-05-07 MED ORDER — ONDANSETRON 8 MG PO TBDP: 8.0000 mg | ORAL_TABLET | Freq: Three times a day (TID) | ORAL | 0 refills | Status: DC | PRN

## 2020-05-07 MED ORDER — IOHEXOL 300 MG/ML  SOLN
80.0000 mL | Freq: Once | INTRAMUSCULAR | Status: AC | PRN
  Administered 2020-05-07: 80 mL via INTRAVENOUS

## 2020-05-07 MED ORDER — LEVOFLOXACIN 500 MG PO TABS
500.0000 mg | ORAL_TABLET | Freq: Every day | ORAL | 0 refills | Status: DC
Start: 2020-05-07 — End: 2020-10-06

## 2020-05-07 NOTE — ED Provider Notes (Signed)
  Physical Exam  BP (!) 126/58   Pulse 68   Temp 98.5 F (36.9 C) (Oral)   Resp 12   SpO2 100%   Physical Exam  ED Course/Procedures     Procedures  MDM  70 yo female presented with vague lower abdominal pain. NTTP Urine c.w. uti, received iv rocephin CT pending CT Abdomen Pelvis W Contrast  Result Date: 05/07/2020 CLINICAL DATA:  Nonlocalized acute abdominal pain. EXAM: CT ABDOMEN AND PELVIS WITH CONTRAST TECHNIQUE: Multidetector CT imaging of the abdomen and pelvis was performed using the standard protocol following bolus administration of intravenous contrast. CONTRAST:  10mL OMNIPAQUE IOHEXOL 300 MG/ML  SOLN COMPARISON:  None. FINDINGS: Lower chest: No acute abnormality. Hepatobiliary: No focal liver abnormality. Multiple calcified gallstones within the gallbladder lumen. No gallbladder wall thickening or pericholecystic fluid. No biliary dilatation. Pancreas: No focal lesion. Normal pancreatic contour. No surrounding inflammatory changes. No main pancreatic ductal dilatation. Spleen: Normal in size without focal abnormality. Adrenals/Urinary Tract: No adrenal nodule bilaterally. Right collecting system urothelial thickening as well as associated asymmetric right perinephric fat stranding and mild hydro ureter. Mild fullness of the right pelvocaliceal system with no frank hydronephrosis. No left hydronephrosis. No left hydroureter. No nephroureterolithiasis bilaterally. Urinary bladder wall thickening may be due to decompression. On delayed imaging, there is no urothelial wall thickening and there are no filling defects in the opacified portions of the bilateral collecting systems or ureters. Stomach/Bowel: Stomach is within normal limits. No evidence of bowel wall thickening or dilatation. Appendix appears normal. Vascular/Lymphatic: No abdominal aorta or iliac aneurysm. At least moderate atherosclerotic plaque of the aorta and its branches. No abdominal, pelvic, or inguinal  lymphadenopathy. Reproductive: Uterus and bilateral adnexa are unremarkable. Other: No intraperitoneal free fluid. No intraperitoneal free gas. No organized fluid collection. Musculoskeletal: Tiny fat containing umbilical hernia. No suspicious lytic or blastic osseous lesions. No acute displaced fracture. IMPRESSION: 1. Findings suggestive of right collecting system infection. No abscess formation. No associated nephroureterolithiasis. Correlate with urinalysis to evaluate for infection. 2. Cholelithiasis with no acute cholecystitis. 3.  Aortic Atherosclerosis (ICD10-I70.0). Electronically Signed   By: Iven Finn M.D.   On: 05/07/2020 15:53    Patient with work up c.w. right collecting system infection.  Patient treated here with iv rocephin.  She appears stable for outpatient management. She reports that she has had at least 3 previous infections since November and was treated with Keflex within the past month.  Plan Levaquin and patient advised regarding need for follow-up, specifically that she should follow-up with her primary care doctor within the next 1 to 2 weeks for correlation and coordination of her care.  Have also instructed her regarding need for return, specifically worsening pain, inability to tolerate fluids, or fever.       Pattricia Boss, MD 05/07/20 [phone]

## 2020-05-07 NOTE — ED Provider Notes (Addendum)
[facility] [facility] EMERGENCY DEPARTMENT Provider Note   CSN: [id number] Arrival date & time: 05/07/20  1058     History Chief Complaint  Patient presents with  . Abdominal Pain    Courtney Pearson is a 70 y.o. female.  Patient presents ER chief complaint of abdominal pain describes a sharp aching intermittent.  Describes it as lower abdomen perhaps more in the right side than the left.  However by the time she arrived to the ER currently states the pain is gone she has very mild to no pain.  Denies fevers cough.  No vomiting no diarrhea.        Past Medical History:  Diagnosis Date  . Acute on chronic diastolic heart failure ([place]) 03/12/2020  . Atypical atrial flutter ([place]) 03/12/2020  . CAD in native artery 03/20/2019  . Diabetes ([place])   . HTN (hypertension)   . Hyperlipidemia   . Hypothyroidism   . Palpitation   . Persistent atrial fibrillation ([place]) 04/03/2017  . Renal insufficiency     Patient Active Problem List   Diagnosis Date Noted  . Acute on chronic diastolic heart failure ([place]) 03/12/2020  . Atypical atrial flutter ([place]) 03/12/2020  . CAD in native artery 03/20/2019  . Persistent atrial fibrillation ([place])   . Dyspnea 10/30/2012  . Palpitations 10/30/2012  . Essential hypertension 10/30/2012  . Hyperlipidemia 10/30/2012    Past Surgical History:  Procedure Laterality Date  . ATRIAL FIBRILLATION ABLATION N/A 01/01/2018   Procedure: ATRIAL FIBRILLATION ABLATION;  Surgeon: Thompson Grayer, MD;  Location: [facility];  Service: Cardiovascular;  Laterality: N/A;  . ATRIAL FIBRILLATION ABLATION N/A 10/31/2018   Procedure: ATRIAL FIBRILLATION ABLATION;  Surgeon: Thompson Grayer, MD;  Location: [facility];  Service: Cardiovascular;  Laterality: N/A;  . BREAST BIOPSY    . CARDIOVERSION N/A 04/30/2017   Procedure: CARDIOVERSION;  Surgeon: [name], MD;  Location: [facility] ENDOSCOPY;  Service: Cardiovascular;  Laterality: N/A;  .  CARDIOVERSION N/A 02/15/2018   Procedure: CARDIOVERSION;  Surgeon: [name], MD;  Location: [facility] ENDOSCOPY;  Service: Cardiovascular;  Laterality: N/A;  . CARDIOVERSION N/A 04/10/2018   Procedure: CARDIOVERSION;  Surgeon: Josue Hector, MD;  Location: [facility];  Service: Cardiovascular;  Laterality: N/A;  . CESAREAN SECTION    . EYE SURGERY       OB History   No obstetric history on file.     Family History  Problem Relation Age of Onset  . Heart disease Other        No family history  . Hypertension Mother   . Osteoarthritis Mother   . Osteoporosis Mother   . Anemia Father   . Asthma Sister   . Osteoarthritis Sister   . OCD Sister   . Leukemia Brother   . Lupus Child     Social History   Tobacco Use  . Smoking status: Never Smoker  . Smokeless tobacco: Never Used  Vaping Use  . Vaping Use: Never used  Substance Use Topics  . Alcohol use: No    Home Medications Prior to Admission medications   Medication Sig Start Date End Date Taking? Authorizing Provider  acetaminophen (TYLENOL) 500 MG tablet Take 500 mg by mouth as needed for moderate pain or headache.    Yes [provider]  amoxicillin (AMOXIL) 500 MG capsule Take 4 capsules by mouth as needed. 1 hour prior to dental appointments 10/08/18  Yes [provider]  apixaban (ELIQUIS) 5 MG TABS tablet  Take 1 tablet (5 mg total) by mouth 2 (two) times daily. 11/07/19  Yes Sherran Needs, NP  atorvastatin (LIPITOR) 10 MG tablet Take 10 mg by mouth every other day.  09/20/12  Yes [provider]  bismuth subsalicylate (PEPTO BISMOL) 262 MG/15ML suspension Take 30 mLs by mouth as needed for indigestion.    Yes [provider]  calcium carbonate (TUMS EX) 750 MG chewable tablet Chew 750 mg by mouth daily.    Yes [provider]  Cholecalciferol (VITAMIN D-3) 25 MCG (1000 UT) CAPS Take 1,000 Units by mouth daily.   Yes [provider]  fexofenadine (ALLEGRA)  180 MG tablet Take 180 mg by mouth as needed for allergies or rhinitis.    Yes [provider]  furosemide (LASIX) 20 MG tablet TAKE 1 TABLET (20 MG TOTAL) BY MOUTH DAILY AS NEEDED FOR FLUID. Patient taking differently: Take 40 mg by mouth daily. 03/30/20  Yes [name], MD  hydrocortisone cream 1 % Apply 1 application topically daily as needed for itching.   Yes [provider]  insulin aspart (NOVOLOG) 100 UNIT/ML injection Inject 25 Units into the skin daily. Via insulin pump   Yes [provider]  levothyroxine (SYNTHROID) 88 MCG tablet Take 88 mcg by mouth daily.  12/15/18  Yes [provider]  loperamide (IMODIUM A-D) 2 MG tablet Take 2-4 mg by mouth as needed for diarrhea or loose stools.    Yes [provider]  losartan (COZAAR) 25 MG tablet TAKE 1/2 TABLET TWICE A DAY 09/12/19  Yes [name], MD  metoprolol tartrate (LOPRESSOR) 25 MG tablet TAKE 1/2 TABLET (12.5 MG TOTAL) BY MOUTH 2 TIMES DAILY. 12/30/19  Yes Troy Sine, MD  Multiple Vitamins-Minerals (MULTIVITAMIN ADULT PO) Take 1 tablet by mouth once a week.   Yes [provider]  potassium chloride (KLOR-CON) 10 MEQ tablet Take 10 mEq by mouth every other day.   Yes [provider]    Allergies    5-alpha reductase inhibitors  Review of Systems   Review of Systems  Constitutional: Negative for fever.  HENT: Negative for ear pain.   Eyes: Negative for pain.  Respiratory: Negative for cough.   Cardiovascular: Negative for chest pain.  Gastrointestinal: Positive for abdominal pain.  Genitourinary: Negative for flank pain.  Musculoskeletal: Negative for back pain.  Skin: Negative for rash.  Neurological: Negative for headaches.    Physical Exam Updated Vital Signs BP (!) 126/58   Pulse 68   Temp 98.5 F (36.9 C) (Oral)   Resp 12   SpO2 100%   Physical Exam Constitutional:      General: She is not in acute distress.    Appearance: Normal  appearance.  HENT:     Head: Normocephalic.     Nose: Nose normal.  Eyes:     Extraocular Movements: Extraocular movements intact.  Cardiovascular:     Rate and Rhythm: Normal rate.  Pulmonary:     Effort: Pulmonary effort is normal.  Abdominal:     General: There is no distension.     Tenderness: There is no abdominal tenderness. There is no right CVA tenderness, left CVA tenderness, guarding or rebound.  Musculoskeletal:        General: Normal range of motion.     Cervical back: Normal range of motion.  Neurological:     General: No focal deficit present.     Mental Status: She is alert. Mental status is at baseline.  ED Results / Procedures / Treatments   Labs (all labs ordered are listed, but only abnormal results are displayed) Labs Reviewed  COMPREHENSIVE METABOLIC PANEL - Abnormal; Notable for the following components:      Result Value   Glucose, Bld 193 (*)    BUN 29 (*)    Creatinine, Ser 1.46 (*)    Alkaline Phosphatase 150 (*)    GFR, Estimated 38 (*)    All other components within normal limits  CBC - Abnormal; Notable for the following components:   Hemoglobin 11.9 (*)    All other components within normal limits  URINALYSIS, ROUTINE W REFLEX MICROSCOPIC - Abnormal; Notable for the following components:   APPearance CLOUDY (*)    Glucose, UA 50 (*)    Hgb urine dipstick LARGE (*)    Ketones, ur 5 (*)    Protein, ur 30 (*)    Nitrite POSITIVE (*)    Leukocytes,Ua LARGE (*)    RBC / HPF >50 (*)    WBC, UA >50 (*)    Bacteria, UA MANY (*)    All other components within normal limits  LIPASE, BLOOD    EKG EKG Interpretation  Date/Time:  Friday May 07 2020 11:05:25 EDT Ventricular Rate:  114 PR Interval:    QRS Duration: 194 QT Interval:  352 QTC Calculation: 443 R Axis:   -32 Text Interpretation: Atrial fibrillation Paired ventricular premature complexes Right bundle branch block Left ventricular hypertrophy Confirmed by Thamas Jaegers ([id number])  on 05/07/2020 11:18:20 AM   Radiology No results found.  Procedures Procedures   Medications Ordered in ED Medications  cefTRIAXone (ROCEPHIN) 1 g in sodium chloride 0.9 % 100 mL IVPB (has no administration in time range)  iohexol (OMNIPAQUE) 300 MG/ML solution 80 mL (80 mLs Intravenous Contrast Given 05/07/20 1528)    ED Course  I have reviewed the triage vital signs and the nursing notes.  Pertinent labs & imaging results that were available during my care of the patient were reviewed by me and considered in my medical decision making (see chart for details).    MDM Rules/Calculators/A&P                          Abdominal exam at this time is benign no guarding no tenderness noted.  Patient labs are sent and unremarkable.  Urinalysis concerning for UTI, patient given IV Rocephin.  CT abdomen pelvis pursued and awaiting final result. Will be signed out to oncoming provider.  Final Clinical Impression(s) / ED Diagnoses Final diagnoses:  Urinary tract infection without hematuria, site unspecified    Rx / DC Orders ED Discharge Orders    None       Luna Fuse, MD 05/07/20 1448    Luna Fuse, MD 05/07/20 1539

## 2020-05-07 NOTE — Discharge Instructions (Addendum)
Drink plenty of fluids Use Tylenol as needed for pain Take all antibiotics as prescribed Return to the emergency department if you had had fever, intractable vomiting, or worsening pain

## 2020-05-07 NOTE — ED Triage Notes (Signed)
Patient BIB [facility] from home for right flank pain that started a few days ago. 20g saline lock in right forearm. Patient alert, oriented, and in no apparent distress at this time.  4mg  zofran given PTA.  EMS vitals 166/86 HR 74 RR 22 98% on room air

## 2020-05-27 ENCOUNTER — Encounter (INDEPENDENT_AMBULATORY_CARE_PROVIDER_SITE_OTHER): Payer: Medicare Other | Admitting: Ophthalmology

## 2020-06-01 DIAGNOSIS — E11319 Type 2 diabetes mellitus with unspecified diabetic retinopathy without macular edema: Secondary | ICD-10-CM | POA: Diagnosis not present

## 2020-06-01 DIAGNOSIS — E1165 Type 2 diabetes mellitus with hyperglycemia: Secondary | ICD-10-CM | POA: Diagnosis not present

## 2020-06-01 DIAGNOSIS — N1832 Chronic kidney disease, stage 3b: Secondary | ICD-10-CM | POA: Diagnosis not present

## 2020-06-01 DIAGNOSIS — Z794 Long term (current) use of insulin: Secondary | ICD-10-CM | POA: Diagnosis not present

## 2020-06-01 DIAGNOSIS — E1022 Type 1 diabetes mellitus with diabetic chronic kidney disease: Secondary | ICD-10-CM | POA: Diagnosis not present

## 2020-06-01 DIAGNOSIS — E1042 Type 1 diabetes mellitus with diabetic polyneuropathy: Secondary | ICD-10-CM | POA: Diagnosis not present

## 2020-06-01 DIAGNOSIS — E039 Hypothyroidism, unspecified: Secondary | ICD-10-CM | POA: Diagnosis not present

## 2020-06-04 DIAGNOSIS — L821 Other seborrheic keratosis: Secondary | ICD-10-CM | POA: Diagnosis not present

## 2020-06-04 DIAGNOSIS — R202 Paresthesia of skin: Secondary | ICD-10-CM | POA: Diagnosis not present

## 2020-06-04 DIAGNOSIS — L820 Inflamed seborrheic keratosis: Secondary | ICD-10-CM | POA: Diagnosis not present

## 2020-06-04 DIAGNOSIS — D485 Neoplasm of uncertain behavior of skin: Secondary | ICD-10-CM | POA: Diagnosis not present

## 2020-06-04 DIAGNOSIS — I8311 Varicose veins of right lower extremity with inflammation: Secondary | ICD-10-CM | POA: Diagnosis not present

## 2020-06-04 DIAGNOSIS — L814 Other melanin hyperpigmentation: Secondary | ICD-10-CM | POA: Diagnosis not present

## 2020-06-04 DIAGNOSIS — D1801 Hemangioma of skin and subcutaneous tissue: Secondary | ICD-10-CM | POA: Diagnosis not present

## 2020-06-04 DIAGNOSIS — I8312 Varicose veins of left lower extremity with inflammation: Secondary | ICD-10-CM | POA: Diagnosis not present

## 2020-06-04 DIAGNOSIS — D225 Melanocytic nevi of trunk: Secondary | ICD-10-CM | POA: Diagnosis not present

## 2020-06-04 DIAGNOSIS — L723 Sebaceous cyst: Secondary | ICD-10-CM | POA: Diagnosis not present

## 2020-06-10 ENCOUNTER — Encounter (INDEPENDENT_AMBULATORY_CARE_PROVIDER_SITE_OTHER): Payer: Medicare Other | Admitting: Ophthalmology

## 2020-06-10 ENCOUNTER — Other Ambulatory Visit: Payer: Self-pay

## 2020-06-10 DIAGNOSIS — I10 Essential (primary) hypertension: Secondary | ICD-10-CM | POA: Diagnosis not present

## 2020-06-10 DIAGNOSIS — E113512 Type 2 diabetes mellitus with proliferative diabetic retinopathy with macular edema, left eye: Secondary | ICD-10-CM

## 2020-06-10 DIAGNOSIS — H35033 Hypertensive retinopathy, bilateral: Secondary | ICD-10-CM

## 2020-06-10 DIAGNOSIS — H43813 Vitreous degeneration, bilateral: Secondary | ICD-10-CM | POA: Diagnosis not present

## 2020-06-10 DIAGNOSIS — E113591 Type 2 diabetes mellitus with proliferative diabetic retinopathy without macular edema, right eye: Secondary | ICD-10-CM

## 2020-07-01 DIAGNOSIS — S99911A Unspecified injury of right ankle, initial encounter: Secondary | ICD-10-CM | POA: Diagnosis not present

## 2020-07-01 DIAGNOSIS — S6992XA Unspecified injury of left wrist, hand and finger(s), initial encounter: Secondary | ICD-10-CM | POA: Diagnosis not present

## 2020-07-05 NOTE — Progress Notes (Signed)
Cardiology Office Note   Date:  07/06/2020   ID:  JAIDA BASURTO, DOB [birth year], MRN [id number]  PCP:  Jonathon Jordan, MD  Cardiologist:  Lilli Light Oval Linsey, MD, [facility]  Electrophysiologist: Dr. Rayann Heman Nephrologist: Dr. Joelyn Oms  Chief Complaint  Patient presents with   Follow-up    3 months.   Headache   Shortness of Breath   Edema    Legs and ankles.      History of Present Illness: Courtney Pearson is a 70 y.o. female with persistent atrial fibrillation/flutter s/p ablation 12/2017 and DCCV, coronary calcification, hypertension, hyperlipidemia, and diabetes here for follow up.  [name] saw her endocrinologist 03/2017 and was noted to be in atrial flutter with ventricular rates in the 120s-140s.  Propranolol was switched to metoprolol and she was started on Eliquis.  She subsequently followed up in atrial fibrillation clinic and had an echo 04/12/2017 that revealed LVEF 55 to 60% with mildly elevated pulmonary pressures.  She was noted to have a small pericardial effusion but no evidence of tamponade.  She underwent cardioversion 04/2017 but had recurrent atrial for relation.  She wore an event monitor 05/2017 that showed she was in atrial fibrillation 50% of the time.  She also had a 3.3-second postconversion pause.  She was subsequently started on flecainide.  She has also been on amiodarone and failed multiple cardioversion.  She underwent atrial fibrillation ablation 12/2017.  She had recurrent atrial fibrillation and DCCV that was unsuccessful.  She had a consultation with Dr. Orvan Seen and is considering surgical ablation.  Prior to her ablation she had a preablation CT that had a calcium score 1712.  She had a [employer] 03/2019 that was negative for ischemia.  She feels her heart skipping around.  She gets short of breath very easily.  She notices it even walking form one side of the house to the other.  She had a consultation with Dr. Orvan Seen regarding MAZE.  She is  concerned that the success rate may not be high and she doesn't want to go through it if it won't work.   [name] was referred back to atrial fibrillation clinic and was advised that Tikosyn was an option.  She continues to have frequent atrial fibrillation. At her last appointment she wasvolume overloaded and advised to take extra Lasix for 3 days. She called our office 05/2020 and her heart rate was in the 30s. She was advised to stop taking her metoprolol.  She brings strips from her [employer] mobile device that showed that she was in sinus bradycardia in the 30s.  It was one of the episodes with a postconversion pause but she remained in sinus bradycardia.   Today, she reports falling last week and spraining her right ankle and left forearm. Her left forearm is also bruised, and she is using a cane to help her walk. She believes she caught her foot on a floor mat and fell flat. During her previous episode of her heart rate in the 30s, her blood pressure was also low and she felt terrible. About a week later, she had one more similar episode. Currently she is back to taking 1/2 tablet of Metoprolol because her heart rate increased. When she does check her heart rate it is typically in the 70's. Lately, she is concerned that she does not have the stamina and energy that she wants to have. She feels weak in her LE's, has difficulty climbing stairs, and is short of breath  with walking short distances. She also continues to have occasional LE edema. Usually she takes 40 mg furosemide around mid-morning every day. For exercise, she now has a stationary bike but has not been able to use it due to her recent injuries. She denies any chest pain, or palpitations. No headaches, lightheadedness, or syncope to report. Also has no orthopnea or PND.   Past Medical History:  Diagnosis Date   Acute on chronic diastolic heart failure ([place]) 03/12/2020   Atypical atrial flutter ([place]) 03/12/2020   CAD in native artery  03/20/2019   Diabetes ([place])    HTN (hypertension)    Hyperlipidemia    Hypothyroidism    Palpitation    Persistent atrial fibrillation ([place]) 04/03/2017   Renal insufficiency    Sick sinus syndrome ([place]) 07/06/2020    Past Surgical History:  Procedure Laterality Date   ATRIAL FIBRILLATION ABLATION N/A 01/01/2018   Procedure: ATRIAL FIBRILLATION ABLATION;  Surgeon: Thompson Grayer, MD;  Location: [place] [facility];  Service: Cardiovascular;  Laterality: N/A;   ATRIAL FIBRILLATION ABLATION N/A 10/31/2018   Procedure: ATRIAL FIBRILLATION ABLATION;  Surgeon: Thompson Grayer, MD;  Location: [place] [facility];  Service: Cardiovascular;  Laterality: N/A;   BREAST BIOPSY     CARDIOVERSION N/A 04/30/2017   Procedure: CARDIOVERSION;  Surgeon: [name], MD;  Location: [place];  Service: Cardiovascular;  Laterality: N/A;   CARDIOVERSION N/A 02/15/2018   Procedure: CARDIOVERSION;  Surgeon: [name], MD;  Location: [place];  Service: Cardiovascular;  Laterality: N/A;   CARDIOVERSION N/A 04/10/2018   Procedure: CARDIOVERSION;  Surgeon: Josue Hector, MD;  Location: [facility];  Service: Cardiovascular;  Laterality: N/A;   CESAREAN SECTION     EYE SURGERY       Current Outpatient Medications  Medication Sig Dispense Refill   acetaminophen (TYLENOL) 500 MG tablet Take 500 mg by mouth as needed for moderate pain or headache.      amoxicillin (AMOXIL) 500 MG capsule Take 4 capsules by mouth as needed. 1 hour prior to dental appointments     apixaban (ELIQUIS) 5 MG TABS tablet Take 1 tablet (5 mg total) by mouth 2 (two) times daily. 180 tablet 2   atorvastatin (LIPITOR) 10 MG tablet Take 10 mg by mouth every other day.      bismuth subsalicylate (PEPTO BISMOL) 262 MG/15ML suspension Take 30 mLs by mouth as needed for indigestion.      calcium carbonate (TUMS EX) 750 MG chewable tablet Chew 750 mg by mouth daily.      Cholecalciferol (VITAMIN D-3) 25 MCG (1000 UT) CAPS Take  1,000 Units by mouth daily.     fexofenadine (ALLEGRA) 180 MG tablet Take 180 mg by mouth as needed for allergies or rhinitis.      furosemide (LASIX) 20 MG tablet TAKE 1 TABLET (20 MG TOTAL) BY MOUTH DAILY AS NEEDED FOR FLUID. (Patient taking differently: Take 40 mg by mouth daily.) 90 tablet 1   hydrocortisone cream 1 % Apply 1 application topically daily as needed for itching.     insulin aspart (NOVOLOG) 100 UNIT/ML injection Inject 25 Units into the skin daily. Via insulin pump     levofloxacin (LEVAQUIN) 500 MG tablet Take 1 tablet (500 mg total) by mouth daily. 7 tablet 0   levothyroxine (SYNTHROID) 88 MCG tablet Take 88 mcg by mouth daily.      loperamide (IMODIUM A-D) 2 MG tablet Take 2-4 mg by mouth as needed for diarrhea or loose stools.  losartan (COZAAR) 25 MG tablet TAKE 1/2 TABLET TWICE A DAY 90 tablet 3   metoprolol tartrate (LOPRESSOR) 25 MG tablet Take 12.5 mg by mouth 2 (two) times daily.     Multiple Vitamins-Minerals (MULTIVITAMIN ADULT PO) Take 1 tablet by mouth once a week.     ondansetron (ZOFRAN ODT) 8 MG disintegrating tablet Take 1 tablet (8 mg total) by mouth every 8 (eight) hours as needed for nausea or vomiting. 20 tablet 0   potassium chloride (KLOR-CON) 10 MEQ tablet Take 10 mEq by mouth every other day.     No current facility-administered medications for this visit.    Allergies:   5-alpha reductase inhibitors and Metoprolol    Social History:  The patient  reports that she has never smoked. She has never used smokeless tobacco. She reports that she does not drink alcohol.   Family History:  The patient's family history includes Anemia in her father; Asthma in her sister; Heart disease in an other family member; Hypertension in her mother; Leukemia in her brother; Lupus in her child; OCD in her sister; Osteoarthritis in her mother and sister; Osteoporosis in her mother.    ROS:   Please see the history of present illness. (+) Fall (+) Sprain, right  ankle (+) Sprain, left forearm (+) Fatigue (+) Shortness of breath (+) Bilateral LE weakness (+) Bilateral LE edema All other systems are reviewed and negative.    PHYSICAL EXAM: VS:  BP 118/64 (BP Location: Right Arm, Patient Position: Sitting, Cuff Size: Normal)   Pulse (!) 108   Ht 5\' 7"  (1.702 m)   Wt 199 lb (90.3 kg)   BMI 31.17 kg/m  , BMI Body mass index is 31.17 kg/m. GENERAL:  Well appearing HEENT: Pupils equal round and reactive, fundi not visualized, oral mucosa unremarkable NECK: No JVD.  waveform within normal limits, carotid upstroke brisk and symmetric, no bruits LUNGS:  Clear to auscultation bilaterally HEART: Tachycardic.  Irregularly irregular.  PMI not displaced or sustained,S1 and S2 within normal limits, no S3, no S4, no clicks, no rubs, no murmurs ABD:  Flat, positive bowel sounds normal in frequency in pitch, no bruits, no rebound, no guarding, no midline pulsatile mass, no hepatomegaly, no splenomegaly EXT:  2 plus pulses throughout, 1+ bilateral edema to the upper tibia bilaterally, no cyanosis no clubbing SKIN:  Bruising on left forearm.  Stasis dermatitis of the LEs No rashes no nodules NEURO:  Cranial nerves II through XII grossly intact, motor grossly intact throughout PSYCH:  Cognitively intact, oriented to person place and time  EKG:   07/06/2020: Atrial Fibrillation. Rate 108 bpm. LAD. 03/12/20: Atrial flutter.  Rate 88 bpm.  LAFB.   06/27/19: atrial flutter.  Rate 70 bpm.  [place] ABI 02/24/2020: Right: Resting right ankle-brachial index indicates noncompressible right  lower extremity arteries. The right toe-brachial index is normal.   Left: Resting left ankle-brachial index indicates noncompressible left  lower extremity arteries. The left toe-brachial index is normal.   Lexiscan Myoview 04/01/19: There was no ST segment deviation noted during stress. The study is normal. This is a low risk study with no evidence of ischemia. Systolic function is not  assessed due to atrial fibrillation.  Echo 01/01/2019:  1. Left ventricular ejection fraction, by visual estimation, is 55 to  60%. The left ventricle has normal function. There is mildly increased  left ventricular hypertrophy.   2. Elevated left ventricular end-diastolic pressure.   3. Left ventricular diastolic parameters are indeterminate.  4. Global right ventricle has normal systolic function.The right  ventricular size is normal. No increase in right ventricular wall  thickness.   5. Left atrial size was mildly dilated.   6. Right atrial size was mildly dilated.   7. Moderate mitral annular calcification.   8. The mitral valve is abnormal in structure. Trivial mitral valve  regurgitation. Mitral valve mean diastolic gradient is 5 mmHg, heart rate  variable.   9. The tricuspid valve is normal in structure. Tricuspid valve  regurgitation mild-moderate.  10. The aortic valve is tricuspid. Aortic valve regurgitation is not  visualized. No evidence of aortic valve sclerosis or stenosis.  11. The pulmonic valve was normal in structure. Pulmonic valve  regurgitation is trivial.  12. Mildly elevated pulmonary artery systolic pressure.  13. The inferior vena cava is normal in size with <50% respiratory  variability, suggesting right atrial pressure of 8 mmHg.  14. Small pericardial effusion.  15. The pericardial effusion is posterior to the left ventricle.  Pre-ablation cardiac CT 10/25/18: IMPRESSION: 1. Single left pulmonary vein with measurements as above. Ostial diameter measurements noted to be similar to CTA dated 12/25/2017, however area measurements noted to be smaller on this study for RUPV and left common trunk. 2. There is no thrombus in the left atrial appendage. 3. The esophagus runs in close proximity to the RUPV ostium. 4. Severe biatrial enlargement. 5. 3 vessel calcified atherosclerosis. Coronary calcium score of 1712.  14 Day Event Monitor 06/07/17:   Quality:  Fair.  Baseline artifact. Predominant rhythm: atrial fibrillation 58%.  Rates up to 110 bpm. Average heart rate: 77 bpm 3.3 second post conversion pause noted. PACs and PVCs, atrial bigeminy noted   Echo 04/12/17: Study Conclusions   - Left ventricle: The cavity size was normal. Wall thickness was   normal. Systolic function was normal. The estimated ejection   fraction was in the range of 55% to 60%. Wall motion was normal;   there were no regional wall motion abnormalities. The study is   not technically sufficient to allow evaluation of LV diastolic   function. - Aortic valve: Trileaflet; mildly thickened, mildly calcified   leaflets. - Mitral valve: Calcified annulus. Mildly thickened leaflets . - Pulmonary arteries: Systolic pressure was mildly to moderately   increased. PA peak pressure: 46 mm Hg (S). - Pericardium, extracardiac: A small pericardial effusion was   identified circumferential to the heart, mostly along the   posterior and right ventricular free wall. There was no evidence   of hemodynamic compromise.   Recent Labs: 03/19/2020: BNP 112.0 05/07/2020: ALT 16; BUN 29; Creatinine, Ser 1.46; Hemoglobin 11.9; Platelets 171; Potassium 3.7; Sodium 139   12/11/16: Sodium 140, potassium 4.4, BUN 26, creatinine 1.19 AST 22, ALT 18 Hemoglobin A1c 8.2% Total cholesterol 143, triglycerides 54, HDL 62, LDL 71   Lipid Panel No results found for: CHOL, TRIG, HDL, CHOLHDL, VLDL, LDLCALC, LDLDIRECT    Wt Readings from Last 3 Encounters:  07/06/20 199 lb (90.3 kg)  03/26/20 201 lb (91.2 kg)  03/12/20 205 lb 6.4 oz (93.2 kg)      ASSESSMENT AND PLAN: Persistent atrial fibrillation ([place]) Ms. Keating continues to have atrial fibrillation.  Today her heart rate is not well have been controlled.  At home she is states that it averages in the 70s.  She has had episodes of bradycardia to the 30s.  Therefore we will not titrate her metoprolol today.  Continue Eliquis.  She has  been to the  atrial fibrillation clinic and was thought to be a candidate for Tikosyn.  However she is hesitant to start this medication.  She previously had an ablation and has persistent atrial fibrillation.  She has been evaluated by Dr. Orvan Seen and was thought to be a candidate for a MAZE.  She thinks that she is ready to move forward with this procedure.  We will refer her back to him to discuss.  Sick sinus syndrome [facility]) Patient called the office with episodes of heart rates in the 30s.  She was symptomatic and had to hold metoprolol.  When she stopped it her heart rate became tachycardic again.  I suspect she is having sick sinus syndrome and that her heart rate is low when in sinus rhythm.  Continue metoprolol at current dose today despite the fact that her heart rate is not adequately controlled.  We will place a 14-day ZIO monitor to better assess.  Hyperlipidemia Continue atorvastatin.  LDL was 51 on 05/2020.  CAD in native artery Three-vessel coronary calcification on pulmonary vein study 10/2019.  Her calcium score was 1721.  She had a nuclear stress test 03/2019 that was negative for ischemia  Acute on chronic diastolic heart failure ([place]) She is struggled with volume overload and has some mild edema on exam today.  She will continue her furosemide.  Blood pressure is well-controlled on losartan and metoprolol.  We will repeat an echo since she has been in persistent atrial fibrillation with more often lately.    Current medicines are reviewed at length with the patient today.  The patient does not have concerns regarding medicines.  The following changes have been made: none  Labs/ tests ordered today include:   Orders Placed This Encounter  Procedures   LONG TERM MONITOR (3-14 DAYS)   EKG 12-Lead   ECHOCARDIOGRAM COMPLETE     Disposition:   FU with Frutoso Dimare C. Oval Linsey, MD, [facility] in 3 months.  I,Mathew Stumpf,acting as a Education administrator for [name], MD.,have documented all  relevant documentation on the behalf of [name], MD,as directed by  [name], MD while in the presence of [name], MD.  I, [place] Oval Linsey, MD have reviewed all documentation for this visit.  The documentation of the exam, diagnosis, procedures, and orders on 07/06/2020 are all accurate and complete.   Signed, Noal Abshier C. Oval Linsey, MD, [facility]  07/06/2020 12:50 PM    [facility]

## 2020-07-06 ENCOUNTER — Ambulatory Visit (INDEPENDENT_AMBULATORY_CARE_PROVIDER_SITE_OTHER): Payer: Medicare Other | Admitting: Cardiovascular Disease

## 2020-07-06 ENCOUNTER — Ambulatory Visit (INDEPENDENT_AMBULATORY_CARE_PROVIDER_SITE_OTHER): Payer: Medicare Other

## 2020-07-06 ENCOUNTER — Other Ambulatory Visit: Payer: Self-pay

## 2020-07-06 ENCOUNTER — Encounter: Payer: Self-pay | Admitting: Cardiovascular Disease

## 2020-07-06 VITALS — BP 118/64 | HR 108 | Ht 67.0 in | Wt 199.0 lb

## 2020-07-06 DIAGNOSIS — I495 Sick sinus syndrome: Secondary | ICD-10-CM

## 2020-07-06 DIAGNOSIS — I4819 Other persistent atrial fibrillation: Secondary | ICD-10-CM

## 2020-07-06 DIAGNOSIS — R609 Edema, unspecified: Secondary | ICD-10-CM | POA: Diagnosis not present

## 2020-07-06 DIAGNOSIS — E781 Pure hyperglyceridemia: Secondary | ICD-10-CM

## 2020-07-06 DIAGNOSIS — R001 Bradycardia, unspecified: Secondary | ICD-10-CM

## 2020-07-06 DIAGNOSIS — R0602 Shortness of breath: Secondary | ICD-10-CM

## 2020-07-06 DIAGNOSIS — I5033 Acute on chronic diastolic (congestive) heart failure: Secondary | ICD-10-CM | POA: Diagnosis not present

## 2020-07-06 DIAGNOSIS — I2510 Atherosclerotic heart disease of native coronary artery without angina pectoris: Secondary | ICD-10-CM

## 2020-07-06 HISTORY — DX: Sick sinus syndrome: I49.5

## 2020-07-06 NOTE — Progress Notes (Unsigned)
Patient enrolled for [employer] to mail a 14 day ZIO XT monitor to address on file. 

## 2020-07-06 NOTE — Assessment & Plan Note (Signed)
Continue atorvastatin.  LDL was 51 on 05/2020.

## 2020-07-06 NOTE — Assessment & Plan Note (Signed)
Three-vessel coronary calcification on pulmonary vein study 10/2019.  Her calcium score was 1721.  She had a nuclear stress test 03/2019 that was negative for ischemia

## 2020-07-06 NOTE — Assessment & Plan Note (Signed)
Courtney Pearson continues to have atrial fibrillation.  Today her heart rate is not well have been controlled.  At home she is states that it averages in the 70s.  She has had episodes of bradycardia to the 30s.  Therefore we will not titrate her metoprolol today.  Continue Eliquis.  She has been to the atrial fibrillation clinic and was thought to be a candidate for Tikosyn.  However she is hesitant to start this medication.  She previously had an ablation and has persistent atrial fibrillation.  She has been evaluated by Dr. Orvan Seen and was thought to be a candidate for a MAZE.  She thinks that she is ready to move forward with this procedure.  We will refer her back to him to discuss.

## 2020-07-06 NOTE — Assessment & Plan Note (Signed)
She is struggled with volume overload and has some mild edema on exam today.  She will continue her furosemide.  Blood pressure is well-controlled on losartan and metoprolol.  We will repeat an echo since she has been in persistent atrial fibrillation with more often lately.

## 2020-07-06 NOTE — Assessment & Plan Note (Signed)
Patient called the office with episodes of heart rates in the 30s.  She was symptomatic and had to hold metoprolol.  When she stopped it her heart rate became tachycardic again.  I suspect she is having sick sinus syndrome and that her heart rate is low when in sinus rhythm.  Continue metoprolol at current dose today despite the fact that her heart rate is not adequately controlled.  We will place a 14-day ZIO monitor to better assess.

## 2020-07-06 NOTE — Patient Instructions (Addendum)
Medication Instructions:  Your physician recommends that you continue on your current medications as directed. Please refer to the Current Medication list given to you today.  *If you need a refill on your cardiac medications before your next appointment, please call your pharmacy*  Lab Work: NONE   Testing/Procedures: Your physician has requested that you have an echocardiogram. Echocardiography is a painless test that uses sound waves to create images of your heart. It provides your doctor with information about the size and shape of your heart and how well your heart's chambers and valves are working. This procedure takes approximately one hour. There are no restrictions for this procedure. [facility] [place] [address]  14 DAY ZIO MONITOR   Follow-Up: At [facility], you and your health needs are our priority.  As part of our continuing mission to provide you with exceptional heart care, we have created designated Provider Care Teams.  These Care Teams include your primary Cardiologist (physician) and Advanced Practice Providers (APPs -  Physician Assistants and Nurse Practitioners) who all work together to provide you with the care you need, when you need it.  We recommend signing up for the patient portal called "MyChart".  Sign up information is provided on this After Visit Summary.  MyChart is used to connect with patients for Virtual Visits (Telemedicine).  Patients are able to view lab/test results, encounter notes, upcoming appointments, etc.  Non-urgent messages can be sent to your provider as well.   To learn more about what you can do with MyChart, go to [email or web address].    Your next appointment:   3 month(s)  The format for your next appointment:   In Person  Provider:   DR [place] Monitor Instructions  Your  physician has requested you wear a ZIO patch monitor for 14 days.  This is a single patch monitor. [employer] supplies one patch monitor per enrollment. Additional stickers are not available. Please do not apply patch if you will be having a Nuclear Stress Test,  Echocardiogram, Cardiac CT, MRI, or Chest Xray during the period you would be wearing the  monitor. The patch cannot be worn during these tests. You cannot remove and re-apply the  ZIO XT patch monitor.  Your ZIO patch monitor will be mailed 3 day USPS to your address on file. It may take 3-5 days  to receive your monitor after you have been enrolled.  Once you have received your monitor, please review the enclosed instructions. Your monitor  has already been registered assigning a specific monitor serial # to you.  Billing and Patient Assistance Program Information  We have supplied [employer] with any of your insurance information on file for billing purposes. [employer] offers a sliding scale Patient Assistance Program for patients that do not have  insurance, or whose insurance does not completely cover the cost of the ZIO monitor.  You must apply for the Patient Assistance Program to qualify for this discounted rate.  To apply, please call [employer] at [phone], select option 4, select option 2, ask to apply for  Patient Assistance Program. Theodore Demark will ask your household income, and how many people  are in your household. They will quote your out-of-pocket cost based on that information.  [employer] will also be able to set up a 25-month, interest-free payment plan if needed.  Applying the monitor   Shave hair from upper left chest.  Hold abrader disc by orange tab. Rub abrader in 40 strokes over the upper left chest as  indicated in your monitor instructions.  Clean area with 4 enclosed alcohol pads. Let dry.  Apply patch as indicated in monitor instructions. Patch will be placed under collarbone on left  side of chest with arrow  pointing upward.  Rub patch adhesive wings for 2 minutes. Remove white label marked "1". Remove the white  label marked "2". Rub patch adhesive wings for 2 additional minutes.  While looking in a mirror, press and release button in center of patch. A small green light will  flash 3-4 times. This will be your only indicator that the monitor has been turned on.  Do not shower for the first 24 hours. You may shower after the first 24 hours.  Press the button if you feel a symptom. You will hear a small click. Record Date, Time and  Symptom in the Patient Logbook.  When you are ready to remove the patch, follow instructions on the last 2 pages of Patient  Logbook. Stick patch monitor onto the last page of Patient Logbook.  Place Patient Logbook in the blue and white box. Use locking tab on box and tape box closed  securely. The blue and white box has prepaid postage on it. Please place it in the mailbox as  soon as possible. Your physician should have your test results approximately 7 days after the  monitor has been mailed back to [facility].  Call [place] at [phone] if you have questions regarding  your ZIO XT patch monitor. Call them immediately if you see an orange light blinking on your  monitor.  If your monitor falls off in less than 4 days, contact our Monitor department at [phone].  If your monitor becomes loose or falls off after 4 days call [employer] at [phone] for  suggestions on securing your monitor

## 2020-07-07 ENCOUNTER — Encounter (HOSPITAL_BASED_OUTPATIENT_CLINIC_OR_DEPARTMENT_OTHER): Payer: Self-pay

## 2020-07-10 DIAGNOSIS — R0602 Shortness of breath: Secondary | ICD-10-CM

## 2020-07-10 DIAGNOSIS — R001 Bradycardia, unspecified: Secondary | ICD-10-CM

## 2020-07-10 DIAGNOSIS — I4819 Other persistent atrial fibrillation: Secondary | ICD-10-CM

## 2020-08-01 DIAGNOSIS — Z20822 Contact with and (suspected) exposure to covid-19: Secondary | ICD-10-CM | POA: Diagnosis not present

## 2020-08-02 DIAGNOSIS — R001 Bradycardia, unspecified: Secondary | ICD-10-CM | POA: Diagnosis not present

## 2020-08-02 DIAGNOSIS — I4819 Other persistent atrial fibrillation: Secondary | ICD-10-CM | POA: Diagnosis not present

## 2020-08-02 DIAGNOSIS — R0602 Shortness of breath: Secondary | ICD-10-CM | POA: Diagnosis not present

## 2020-08-04 ENCOUNTER — Ambulatory Visit (HOSPITAL_COMMUNITY): Payer: Medicare Other | Attending: Cardiovascular Disease

## 2020-08-04 ENCOUNTER — Other Ambulatory Visit: Payer: Self-pay

## 2020-08-04 DIAGNOSIS — R609 Edema, unspecified: Secondary | ICD-10-CM

## 2020-08-04 DIAGNOSIS — R001 Bradycardia, unspecified: Secondary | ICD-10-CM | POA: Diagnosis not present

## 2020-08-04 DIAGNOSIS — I4819 Other persistent atrial fibrillation: Secondary | ICD-10-CM | POA: Insufficient documentation

## 2020-08-04 DIAGNOSIS — R0602 Shortness of breath: Secondary | ICD-10-CM

## 2020-08-04 LAB — ECHOCARDIOGRAM COMPLETE
Area-P 1/2: 4.89 cm2
MV M vel: 4.74 m/s
MV Peak grad: 89.9 mmHg
MV VTI: 0.97 cm2
S' Lateral: 2.7 cm

## 2020-08-10 ENCOUNTER — Other Ambulatory Visit (HOSPITAL_BASED_OUTPATIENT_CLINIC_OR_DEPARTMENT_OTHER): Payer: Self-pay | Admitting: *Deleted

## 2020-08-10 DIAGNOSIS — I5033 Acute on chronic diastolic (congestive) heart failure: Secondary | ICD-10-CM

## 2020-08-10 DIAGNOSIS — I480 Paroxysmal atrial fibrillation: Secondary | ICD-10-CM

## 2020-08-11 DIAGNOSIS — M25532 Pain in left wrist: Secondary | ICD-10-CM | POA: Diagnosis not present

## 2020-08-19 ENCOUNTER — Other Ambulatory Visit (HOSPITAL_COMMUNITY): Payer: Self-pay | Admitting: Nurse Practitioner

## 2020-08-19 NOTE — Telephone Encounter (Signed)
Prescription refill request for Eliquis received. Indication:afib Last office visit:[place] 07/06/20 Scr:1.46 05/07/20 Age: 45fWeight:90.3kg

## 2020-08-23 ENCOUNTER — Encounter (HOSPITAL_BASED_OUTPATIENT_CLINIC_OR_DEPARTMENT_OTHER): Payer: Self-pay

## 2020-08-23 DIAGNOSIS — Z79899 Other long term (current) drug therapy: Secondary | ICD-10-CM

## 2020-08-30 MED ORDER — FUROSEMIDE 20 MG PO TABS: 40.0000 mg | ORAL_TABLET | Freq: Every day | ORAL | 3 refills | Status: DC

## 2020-08-30 NOTE — Telephone Encounter (Signed)
Called pt to clarify lasix dose. Pt report she take 40 mg daily. New Rx sent to requested pharmacy. Pt also made aware to check BMP within a week.     [name], MD  You; Alvina Filbert B, LPN Yesterday ([id number] AM)    OK to refill as requested.  Please get a BMP within a week.

## 2020-09-01 DIAGNOSIS — N1832 Chronic kidney disease, stage 3b: Secondary | ICD-10-CM | POA: Diagnosis not present

## 2020-09-01 DIAGNOSIS — E11319 Type 2 diabetes mellitus with unspecified diabetic retinopathy without macular edema: Secondary | ICD-10-CM | POA: Diagnosis not present

## 2020-09-01 DIAGNOSIS — Z794 Long term (current) use of insulin: Secondary | ICD-10-CM | POA: Diagnosis not present

## 2020-09-01 DIAGNOSIS — E1042 Type 1 diabetes mellitus with diabetic polyneuropathy: Secondary | ICD-10-CM | POA: Diagnosis not present

## 2020-09-01 DIAGNOSIS — E1022 Type 1 diabetes mellitus with diabetic chronic kidney disease: Secondary | ICD-10-CM | POA: Diagnosis not present

## 2020-09-01 DIAGNOSIS — E039 Hypothyroidism, unspecified: Secondary | ICD-10-CM | POA: Diagnosis not present

## 2020-09-06 ENCOUNTER — Other Ambulatory Visit (HOSPITAL_COMMUNITY): Payer: Self-pay

## 2020-09-06 MED ORDER — APIXABAN 5 MG PO TABS: 5.0000 mg | ORAL_TABLET | Freq: Two times a day (BID) | ORAL | 0 refills | Status: DC

## 2020-09-18 ENCOUNTER — Other Ambulatory Visit: Payer: Self-pay | Admitting: Cardiovascular Disease

## 2020-10-06 ENCOUNTER — Other Ambulatory Visit: Payer: Self-pay

## 2020-10-06 ENCOUNTER — Encounter (HOSPITAL_BASED_OUTPATIENT_CLINIC_OR_DEPARTMENT_OTHER): Payer: Self-pay | Admitting: Cardiovascular Disease

## 2020-10-06 ENCOUNTER — Ambulatory Visit (INDEPENDENT_AMBULATORY_CARE_PROVIDER_SITE_OTHER): Payer: Medicare Other | Admitting: Cardiovascular Disease

## 2020-10-06 VITALS — BP 128/72 | HR 101 | Ht 67.0 in | Wt 203.1 lb

## 2020-10-06 DIAGNOSIS — I4819 Other persistent atrial fibrillation: Secondary | ICD-10-CM | POA: Diagnosis not present

## 2020-10-06 DIAGNOSIS — Z5181 Encounter for therapeutic drug level monitoring: Secondary | ICD-10-CM | POA: Diagnosis not present

## 2020-10-06 DIAGNOSIS — E781 Pure hyperglyceridemia: Secondary | ICD-10-CM | POA: Diagnosis not present

## 2020-10-06 DIAGNOSIS — I10 Essential (primary) hypertension: Secondary | ICD-10-CM

## 2020-10-06 DIAGNOSIS — I2510 Atherosclerotic heart disease of native coronary artery without angina pectoris: Secondary | ICD-10-CM | POA: Diagnosis not present

## 2020-10-06 DIAGNOSIS — I5033 Acute on chronic diastolic (congestive) heart failure: Secondary | ICD-10-CM | POA: Diagnosis not present

## 2020-10-06 DIAGNOSIS — R531 Weakness: Secondary | ICD-10-CM | POA: Diagnosis not present

## 2020-10-06 HISTORY — DX: Weakness: R53.1

## 2020-10-06 MED ORDER — FUROSEMIDE 40 MG PO TABS: 40.0000 mg | ORAL_TABLET | Freq: Two times a day (BID) | ORAL | 3 refills | Status: DC

## 2020-10-06 NOTE — Assessment & Plan Note (Signed)
She has been feeling tire and weak.  She can no longer step up on a step and feels unsteady.  We will refer her to the PREP program.

## 2020-10-06 NOTE — Assessment & Plan Note (Addendum)
Continue atorvastatin.  Check lipids/CMP in 3 months.

## 2020-10-06 NOTE — Progress Notes (Signed)
Cardiology Office Note   Date:  10/06/2020   ID:  SHAKEYIA KATKO, DOB 1950/06/20, MRN [id number]  PCP:  Jonathon Jordan, MD  Cardiologist:  Lilli Light Oval Linsey, MD, [facility]  Electrophysiologist: Dr. Rayann Heman Nephrologist: Dr. Joelyn Oms  No chief complaint on file.    History of Present Illness: Courtney Pearson is a 70 y.o. female with persistent atrial fibrillation/flutter s/p ablation 12/2017 and DCCV, coronary calcification, hypertension, hyperlipidemia, and diabetes here for follow up.  Courtney Pearson saw her endocrinologist 03/2017 and was noted to be in atrial flutter with ventricular rates in the 120s-140s.  Propranolol was switched to metoprolol and she was started on Eliquis.  She subsequently followed up in atrial fibrillation clinic and had an echo 04/12/2017 that revealed LVEF 55 to 60% with mildly elevated pulmonary pressures.  She was noted to have a small pericardial effusion but no evidence of tamponade.  She underwent cardioversion 04/2017 but had recurrent atrial for relation.  She wore an event monitor 05/2017 that showed she was in atrial fibrillation 50% of the time.  She also had a 3.3-second postconversion pause.  She was subsequently started on flecainide.  She has also been on amiodarone and failed multiple cardioversion.  She underwent atrial fibrillation ablation 12/2017.  She had recurrent atrial fibrillation and DCCV that was unsuccessful.  She had a consultation with Dr. Orvan Seen and is considering surgical ablation.  Prior to her ablation she had a preablation CT that had a calcium score 1712.  She had a [employer] 03/2019 that was negative for ischemia.  She feels her heart skipping around.  She gets short of breath very easily.  She notices it even walking form one side of the house to the other.  She had a consultation with Dr. Orvan Seen regarding MAZE.  She is concerned that the success rate may not be high and she doesn't want to go through it if it won't work.   Courtney Pearson was referred back to atrial fibrillation clinic and was advised that Tikosyn was an option.  She continues to have frequent atrial fibrillation. She was volume overloaded and advised to take extra Lasix for 3 days. She called our office 05/2020 and her heart rate was in the 30s. She was advised to stop taking her metoprolol.  She brought strips from her [employer] mobile device that showed that she was in sinus bradycardia in the 30s.  It was one of the episodes with a postconversion pause but she remained in sinus bradycardia.   At her last appointment her heart rates remained poorly controlled in Afib. She was considering the MAZE procedure She was also having bradycardia at home. She wore a 14-day Zio monitor that showed an average heart rate of 92 bpm with a minimum of 47 bpm and a 3.2 second pause.   Today, she states she is feeling about the same overall. At home her heart rate varies "quite a bit" but averages in the 60s, and may be as high as the 120s. She is still considering the MAZE procedure. Generally, her legs feel weak, and she is unable to step up without steadying herself on something. She has difficulty climbing stairs and walking for long periods. Therefore her formal exercise is severely limited. She purchased a stationary bicycle but has not used this. Her LE edema is less severe in the mornings. She has been wearing light compression socks. Typically she takes her first dose of lasix around noon. When she first began  taking a statin, this was mostly preventative. Over a year ago she completed the PREP program. She denies any palpitations, chest pain, or shortness of breath. No lightheadedness, headaches, syncope, orthopnea, or PND.   Past Medical History:  Diagnosis Date   Acute on chronic diastolic heart failure ([place]) 03/12/2020   Atypical atrial flutter ([place]) 03/12/2020   CAD in native artery 03/20/2019   Diabetes ([place])    HTN (hypertension)    Hyperlipidemia    Hypothyroidism     Palpitation    Persistent atrial fibrillation ([place]) 04/03/2017   Renal insufficiency    Sick sinus syndrome ([place]) 07/06/2020   Weakness 10/06/2020    Past Surgical History:  Procedure Laterality Date   ATRIAL FIBRILLATION ABLATION N/A 01/01/2018   Procedure: ATRIAL FIBRILLATION ABLATION;  Surgeon: Thompson Grayer, MD;  Location: [place] [facility];  Service: Cardiovascular;  Laterality: N/A;   ATRIAL FIBRILLATION ABLATION N/A 10/31/2018   Procedure: ATRIAL FIBRILLATION ABLATION;  Surgeon: Thompson Grayer, MD;  Location: [place] [facility];  Service: Cardiovascular;  Laterality: N/A;   BREAST BIOPSY     CARDIOVERSION N/A 04/30/2017   Procedure: CARDIOVERSION;  Surgeon: [name], MD;  Location: [place];  Service: Cardiovascular;  Laterality: N/A;   CARDIOVERSION N/A 02/15/2018   Procedure: CARDIOVERSION;  Surgeon: [name], MD;  Location: [place];  Service: Cardiovascular;  Laterality: N/A;   CARDIOVERSION N/A 04/10/2018   Procedure: CARDIOVERSION;  Surgeon: Josue Hector, MD;  Location: [facility] ENDOSCOPY;  Service: Cardiovascular;  Laterality: N/A;   CESAREAN SECTION     EYE SURGERY       Current Outpatient Medications  Medication Sig Dispense Refill   acetaminophen (TYLENOL) 500 MG tablet Take 500 mg by mouth as needed for moderate pain or headache.      amoxicillin (AMOXIL) 500 MG capsule Take 4 capsules by mouth as needed. 1 hour prior to dental appointments     apixaban (ELIQUIS) 5 MG TABS tablet Take 1 tablet (5 mg total) by mouth 2 (two) times daily. 28 tablet 0   atorvastatin (LIPITOR) 10 MG tablet Take 10 mg by mouth every other day.      bismuth subsalicylate (PEPTO BISMOL) 262 MG/15ML suspension Take 30 mLs by mouth as needed for indigestion.      calcium carbonate (TUMS EX) 750 MG chewable tablet Chew 750 mg by mouth daily.      Cholecalciferol (VITAMIN D-3) 25 MCG (1000 UT) CAPS Take 1,000 Units by mouth daily.     fexofenadine (ALLEGRA) 180 MG tablet  Take 180 mg by mouth as needed for allergies or rhinitis.      hydrocortisone cream 1 % Apply 1 application topically daily as needed for itching.     insulin aspart (NOVOLOG) 100 UNIT/ML injection Inject 25 Units into the skin daily. Via insulin pump     levothyroxine (SYNTHROID) 88 MCG tablet Take 88 mcg by mouth daily.      loperamide (IMODIUM A-D) 2 MG tablet Take 2-4 mg by mouth as needed for diarrhea or loose stools.      metoprolol tartrate (LOPRESSOR) 25 MG tablet Take 12.5 mg by mouth 2 (two) times daily.     Multiple Vitamins-Minerals (MULTIVITAMIN ADULT PO) Take 1 tablet by mouth once a week.     ondansetron (ZOFRAN ODT) 8 MG disintegrating tablet Take 1 tablet (8 mg total) by mouth every 8 (eight) hours as needed for nausea or vomiting. 20 tablet 0   potassium chloride (KLOR-CON) 10 MEQ tablet Take  10 mEq by mouth every other day.     furosemide (LASIX) 40 MG tablet Take 1 tablet (40 mg total) by mouth 2 (two) times daily. 180 tablet 3   No current facility-administered medications for this visit.    Allergies:   5-alpha reductase inhibitors    Social History:  The patient  reports that she has never smoked. She has never used smokeless tobacco. She reports that she does not drink alcohol.   Family History:  The patient's family history includes Anemia in her father; Asthma in her sister; Heart disease in an other family member; Hypertension in her mother; Leukemia in her brother; Lupus in her child; OCD in her sister; Osteoarthritis in her mother and sister; Osteoporosis in her mother.    ROS:   Please see the history of present illness. (+) Bilateral LE weakness (+) Gait instability (+) Bilateral LE edema All other systems are reviewed and negative.    PHYSICAL EXAM: VS:  BP 128/72   Pulse (!) 101   Ht '5\' 7"'$  (1.702 m)   Wt 203 lb 1.6 oz (92.1 kg)   BMI 31.81 kg/m  , BMI Body mass index is 31.81 kg/m. GENERAL:  Well appearing HEENT: Pupils equal round and reactive,  fundi not visualized, oral mucosa unremarkable NECK: No JVD.  waveform within normal limits, carotid upstroke brisk and symmetric, no bruits LUNGS:  Clear to auscultation bilaterally HEART: Tachycardic.  Irregularly irregular.  PMI not displaced or sustained,S1 and S2 within normal limits, no S3, no S4, no clicks, no rubs, no murmurs ABD:  Flat, positive bowel sounds normal in frequency in pitch, no bruits, no rebound, no guarding, no midline pulsatile mass, no hepatomegaly, no splenomegaly EXT:  2 plus pulses throughout, no edema.  no cyanosis no clubbing SKIN:  Stasis dermatitis of bilateral LE. No rashes no nodules NEURO:  Cranial nerves II through XII grossly intact, motor grossly intact throughout PSYCH:  Cognitively intact, oriented to person place and time  EKG:   10/06/2020: Atrial flutter. Rate 101 bpm. LAFB. 07/06/2020: Atrial Fibrillation. Rate 108 bpm. LAD. 03/12/20: Atrial flutter.  Rate 88 bpm.  LAFB.   06/27/19: atrial flutter.  Rate 70 bpm.  Echo 08/04/2020:  1. Left ventricular ejection fraction, by estimation, is 55 to 60%. The  left ventricle has normal function. The left ventricle has no regional  wall motion abnormalities. There is mild concentric left ventricular  hypertrophy. Diastolic function is  indeterminant due to Afib.   2. Right ventricular systolic function is normal. The right ventricular  size is normal. There is normal pulmonary artery systolic pressure. The  estimated right ventricular systolic pressure is [id number] mmHg.   3. Left atrial size was mildly dilated.   4. Right atrial size was mildly dilated.   5. A small pericardial effusion is present. The pericardial effusion is  posterior to the left ventricle.   6. The mitral valve is abnormal. There is moderate thickening of the  mitral valve leaflet(s). There is moderate calcification of the mitral  valve leaflet(s). Moderate mitral annular calcification. Mild mitral valve  regurgitation. Mild mitral  stenosis.  MVA by continuity is 1.4cm2, mean gradient 59mHg at variable HR.   7. The aortic valve is tricuspid. There is mild thickening of the aortic  valve. Aortic valve regurgitation is not visualized. Mild aortic valve  sclerosis is present, with no evidence of aortic valve stenosis.   8. The inferior vena cava is normal in size with greater than 50%  respiratory variability, suggesting right atrial pressure of 3 mmHg.   Comparison(s): Compared to prior TTE in 12/2018, there is no significant  change.   Monitor 08/02/2020: 14 day Zio Monitor   Quality: Fair.  Baseline artifact. Predominant rhythm: atrial flutter Average heart rate: 92 bpm Max heart rate: 134 bpm Min heart rate: 47 bpm Pauses >2.5 seconds: One pause 3.2 sec   Rare PVCs  [place] ABI 02/24/2020: Right: Resting right ankle-brachial index indicates noncompressible right  lower extremity arteries. The right toe-brachial index is normal.   Left: Resting left ankle-brachial index indicates noncompressible left  lower extremity arteries. The left toe-brachial index is normal.   Lexiscan Myoview 04/01/19: There was no ST segment deviation noted during stress. The study is normal. This is a low risk study with no evidence of ischemia. Systolic function is not assessed due to atrial fibrillation.  Echo 01/01/2019:  1. Left ventricular ejection fraction, by visual estimation, is 55 to  60%. The left ventricle has normal function. There is mildly increased  left ventricular hypertrophy.   2. Elevated left ventricular end-diastolic pressure.   3. Left ventricular diastolic parameters are indeterminate.   4. Global right ventricle has normal systolic function.The right  ventricular size is normal. No increase in right ventricular wall  thickness.   5. Left atrial size was mildly dilated.   6. Right atrial size was mildly dilated.   7. Moderate mitral annular calcification.   8. The mitral valve is abnormal in structure.  Trivial mitral valve  regurgitation. Mitral valve mean diastolic gradient is 5 mmHg, heart rate  variable.   9. The tricuspid valve is normal in structure. Tricuspid valve  regurgitation mild-moderate.  10. The aortic valve is tricuspid. Aortic valve regurgitation is not  visualized. No evidence of aortic valve sclerosis or stenosis.  11. The pulmonic valve was normal in structure. Pulmonic valve  regurgitation is trivial.  12. Mildly elevated pulmonary artery systolic pressure.  13. The inferior vena cava is normal in size with <50% respiratory  variability, suggesting right atrial pressure of 8 mmHg.  14. Small pericardial effusion.  15. The pericardial effusion is posterior to the left ventricle.  Pre-ablation cardiac CT 10/25/18: IMPRESSION: 1. Single left pulmonary vein with measurements as above. Ostial diameter measurements noted to be similar to CTA dated 12/25/2017, however area measurements noted to be smaller on this study for RUPV and left common trunk. 2. There is no thrombus in the left atrial appendage. 3. The esophagus runs in close proximity to the RUPV ostium. 4. Severe biatrial enlargement. 5. 3 vessel calcified atherosclerosis. Coronary calcium score of 1712.  14 Day Event Monitor 06/07/17:   Quality: Fair.  Baseline artifact. Predominant rhythm: atrial fibrillation 58%.  Rates up to 110 bpm. Average heart rate: 77 bpm 3.3 second post conversion pause noted. PACs and PVCs, atrial bigeminy noted   Echo 04/12/17: Study Conclusions   - Left ventricle: The cavity size was normal. Wall thickness was   normal. Systolic function was normal. The estimated ejection   fraction was in the range of 55% to 60%. Wall motion was normal;   there were no regional wall motion abnormalities. The study is   not technically sufficient to allow evaluation of LV diastolic   function. - Aortic valve: Trileaflet; mildly thickened, mildly calcified   leaflets. - Mitral valve:  Calcified annulus. Mildly thickened leaflets . - Pulmonary arteries: Systolic pressure was mildly to moderately   increased. PA peak pressure: 46 mm Hg (S). -  Pericardium, extracardiac: A small pericardial effusion was   identified circumferential to the heart, mostly along the   posterior and right ventricular free wall. There was no evidence   of hemodynamic compromise.   Recent Labs: 03/19/2020: BNP 112.0 05/07/2020: ALT 16; BUN 29; Creatinine, Ser 1.46; Hemoglobin 11.9; Platelets 171; Potassium 3.7; Sodium 139   12/11/16: Sodium 140, potassium 4.4, BUN 26, creatinine 1.19 AST 22, ALT 18 Hemoglobin A1c 8.2% Total cholesterol 143, triglycerides 54, HDL 62, LDL 71   Lipid Panel No results found for: CHOL, TRIG, HDL, CHOLHDL, VLDL, LDLCALC, LDLDIRECT    Wt Readings from Last 3 Encounters:  10/06/20 203 lb 1.6 oz (92.1 kg)  07/06/20 199 lb (90.3 kg)  03/26/20 201 lb (91.2 kg)      ASSESSMENT AND PLAN: Weakness She has been feeling tire and weak.  She can no longer step up on a step and feels unsteady.  We will refer her to the PREP program.   Persistent atrial fibrillation ([place]) She remains in atrial flutter.  Rate control has been challenging.  Her first ablation was not effective.  She was unable to have a secondary to equipment failure.  Her rate control has been challenging due to sick sinus syndrome.  She recently wore a monitor that revealed a 2.3-second pause.  She was previously considering a MAZE procedure with Dr. Orvan Seen.  He is no longer here.  She is interested in following up with Dr. Rayann Heman to discuss whether she may be a candidate for a repeat attempted a traditional ablation or other therapies.  She continues to struggle with fatigue.  Continue Eliquis and metoprolol.   CAD in native artery She is asymptomatic.  She will come back for fasting lipids and a CMP.  She is not on aspirin because she is on Eliquis.  Continue metoprolol.  LDL goal is less than  70.  Essential hypertension BP well-controlled.  Continue metoprolol.  Hyperlipidemia Continue atorvastatin.    Current medicines are reviewed at length with the patient today.  The patient does not have concerns regarding medicines.  The following changes have been made: none  Labs/ tests ordered today include:   Orders Placed This Encounter  Procedures   Basic metabolic panel   Lipid panel   Comprehensive metabolic panel   Amb Referral To Provider Referral Exercise Program (P.R.E.P)   EKG 12-Lead     Disposition:   FU with Vianey Caniglia C. Oval Linsey, MD, [facility] in 6 months.  I,Mathew Stumpf,acting as a Education administrator for [name], MD.,have documented all relevant documentation on the behalf of [name], MD,as directed by  [name], MD while in the presence of [name], MD.  I, [place] Oval Linsey, MD have reviewed all documentation for this visit.  The documentation of the exam, diagnosis, procedures, and orders on 10/06/2020 are all accurate and complete.   Signed, Macey Wurtz C. Oval Linsey, MD, [facility]  10/06/2020 11:50 PM    [place]

## 2020-10-06 NOTE — Assessment & Plan Note (Signed)
She remains in atrial flutter.  Rate control has been challenging.  Her first ablation was not effective.  She was unable to have a secondary to equipment failure.  Her rate control has been challenging due to sick sinus syndrome.  She recently wore a monitor that revealed a 2.3-second pause.  She was previously considering a MAZE procedure with Dr. Orvan Seen.  He is no longer here.  She is interested in following up with Dr. Rayann Heman to discuss whether she may be a candidate for a repeat attempted a traditional ablation or other therapies.  She continues to struggle with fatigue.  Continue Eliquis and metoprolol.

## 2020-10-06 NOTE — Assessment & Plan Note (Signed)
BP well controlled.  Continue metoprolol. 

## 2020-10-06 NOTE — Assessment & Plan Note (Addendum)
She is asymptomatic.  She will come back for fasting lipids and a CMP.  She is not on aspirin because she is on Eliquis.  Continue metoprolol.  LDL goal is less than 70.

## 2020-10-06 NOTE — Assessment & Plan Note (Signed)
She remains volume overloaded and short of breath.  Increase lasix to '40mg'$  bid.  Check BMP in 1 week.

## 2020-10-06 NOTE — Patient Instructions (Addendum)
Medication Instructions:  INCREASE YOUR FUROSEMIDE TO 40  MG TWICE A DAY   *If you need a refill on your cardiac medications before your next appointment, please call your pharmacy*  Lab Work: BMET IN 1 WEEK   FASTING LP/CMET PRIOR TO [place] 3 MONTHS   If you have labs (blood work) drawn today and your tests are completely normal, you will receive your results only by: MyChart Message (if you have MyChart) OR A paper copy in the mail If you have any lab test that is abnormal or we need to change your treatment, we will call you to review the results.  Testing/Procedures: NONE   Follow-Up: At [facility], you and your health needs are our priority.  As part of our continuing mission to provide you with exceptional heart care, we have created designated Provider Care Teams.  These Care Teams include your primary Cardiologist (physician) and Advanced Practice Providers (APPs -  Physician Assistants and Nurse Practitioners) who all work together to provide you with the care you need, when you need it.  We recommend signing up for the patient portal called "MyChart".  Sign up information is provided on this After Visit Summary.  MyChart is used to connect with patients for Virtual Visits (Telemedicine).  Patients are able to view lab/test results, encounter notes, upcoming appointments, etc.  Non-urgent messages can be sent to your provider as well.   To learn more about what you can do with MyChart, go to [email or web address].    Your next appointment:   6 month(s)  The format for your next appointment:   In Person  Provider:   [name], MD  Your physician recommends that you schedule a follow-up appointment in: VIRTUAL VISIT WITH DR Rayann Heman   PAM OR LORA FROM THE [place]

## 2020-10-07 ENCOUNTER — Telehealth: Payer: Self-pay

## 2020-10-07 NOTE — Telephone Encounter (Signed)
Called to discuss PREP program; left voicemail asking for return call.

## 2020-10-13 DIAGNOSIS — Z23 Encounter for immunization: Secondary | ICD-10-CM | POA: Diagnosis not present

## 2020-10-15 DIAGNOSIS — Z5181 Encounter for therapeutic drug level monitoring: Secondary | ICD-10-CM | POA: Diagnosis not present

## 2020-10-16 LAB — BASIC METABOLIC PANEL
BUN/Creatinine Ratio: 19 (ref 12–28)
BUN: 29 mg/dL — ABNORMAL HIGH (ref 8–27)
CO2: 23 mmol/L (ref 20–29)
Calcium: 9.9 mg/dL (ref 8.7–10.3)
Chloride: 100 mmol/L (ref 96–106)
Creatinine, Ser: 1.53 mg/dL — ABNORMAL HIGH (ref 0.57–1.00)
Glucose: 176 mg/dL — ABNORMAL HIGH (ref 65–99)
Potassium: 4.1 mmol/L (ref 3.5–5.2)
Sodium: 139 mmol/L (ref 134–144)
eGFR: 36 mL/min/{1.73_m2} — ABNORMAL LOW (ref >59)

## 2020-10-26 DIAGNOSIS — L810 Postinflammatory hyperpigmentation: Secondary | ICD-10-CM | POA: Diagnosis not present

## 2020-11-10 ENCOUNTER — Telehealth (HOSPITAL_BASED_OUTPATIENT_CLINIC_OR_DEPARTMENT_OTHER): Payer: Self-pay | Admitting: *Deleted

## 2020-11-10 DIAGNOSIS — I10 Essential (primary) hypertension: Secondary | ICD-10-CM

## 2020-11-10 DIAGNOSIS — N289 Disorder of kidney and ureter, unspecified: Secondary | ICD-10-CM

## 2020-11-10 NOTE — Telephone Encounter (Signed)
Advised patient of lab results, verbalized understanding.  

## 2020-11-10 NOTE — Telephone Encounter (Signed)
-----   Message from [name], MD sent at 11/08/2020  5:49 AM EDT ----- Her kidney function is stable but remains a little elevated where it was a year or 2 ago.  I recommend that she start trying to take the Lasix twice a day on Monday Wednesday Friday and only once a day on the other days.  She has increased swelling she can take an extra dose.  Repeat BMP in a couple weeks to see if that helps.

## 2020-11-15 ENCOUNTER — Telehealth (INDEPENDENT_AMBULATORY_CARE_PROVIDER_SITE_OTHER): Payer: Medicare Other | Admitting: Internal Medicine

## 2020-11-15 ENCOUNTER — Telehealth: Payer: Self-pay | Admitting: *Deleted

## 2020-11-15 ENCOUNTER — Other Ambulatory Visit: Payer: Self-pay

## 2020-11-15 VITALS — BP 112/70 | HR 82 | Ht 67.0 in | Wt 198.0 lb

## 2020-11-15 DIAGNOSIS — D6869 Other thrombophilia: Secondary | ICD-10-CM | POA: Diagnosis not present

## 2020-11-15 DIAGNOSIS — I2510 Atherosclerotic heart disease of native coronary artery without angina pectoris: Secondary | ICD-10-CM

## 2020-11-15 DIAGNOSIS — Z01818 Encounter for other preprocedural examination: Secondary | ICD-10-CM

## 2020-11-15 DIAGNOSIS — I4819 Other persistent atrial fibrillation: Secondary | ICD-10-CM | POA: Diagnosis not present

## 2020-11-15 DIAGNOSIS — I4891 Unspecified atrial fibrillation: Secondary | ICD-10-CM

## 2020-11-15 NOTE — Telephone Encounter (Signed)
-----   Message from Thompson Grayer, MD sent at 11/15/2020  3:45 PM EDT ----- Please schedule afib ablation in December. C/I/A Cardiac CT

## 2020-11-15 NOTE — Progress Notes (Signed)
Electrophysiology TeleHealth Note   Due to national recommendations of social distancing due to COVID 19, an audio/video telehealth visit is felt to be most appropriate for this patient at this time.  See MyChart message from today for the patient's consent to telehealth for [facility].  Date:  11/15/2020   ID:  Courtney Pearson, DOB 08/23/1950, MRN [id number]  Location: patient's home  Provider location:  [place]  Evaluation Performed: Follow-up visit  PCP:  Jonathon Jordan, MD   Electrophysiologist:  Dr Rayann Heman  Chief Complaint:  palpitations  History of Present Illness:    Courtney Pearson is a 70 y.o. female who presents via telehealth conferencing today.  Since last being seen in our clinic, the patient reports doing reasonably well.  She continues to have afib.  + palpitations and fatigue.  + occasional SOB.  Today, she denies symptoms of chest pain,  lower extremity edema, dizziness, presyncope, or syncope.  The patient is otherwise without complaint today.   Past Medical History:  Diagnosis Date   Acute on chronic diastolic heart failure ([place]) 03/12/2020   Atypical atrial flutter ([place]) 03/12/2020   CAD in native artery 03/20/2019   Diabetes ([place])    HTN (hypertension)    Hyperlipidemia    Hypothyroidism    Palpitation    Persistent atrial fibrillation ([place]) 04/03/2017   Renal insufficiency    Sick sinus syndrome ([place]) 07/06/2020   Weakness 10/06/2020    Past Surgical History:  Procedure Laterality Date   ATRIAL FIBRILLATION ABLATION N/A 01/01/2018   Procedure: ATRIAL FIBRILLATION ABLATION;  Surgeon: Thompson Grayer, MD;  Location: [place] [facility];  Service: Cardiovascular;  Laterality: N/A;   ATRIAL FIBRILLATION ABLATION N/A 10/31/2018   Procedure: ATRIAL FIBRILLATION ABLATION;  Surgeon: Thompson Grayer, MD;  Location: [place] [facility];  Service: Cardiovascular;  Laterality: N/A;   BREAST BIOPSY     CARDIOVERSION N/A 04/30/2017   Procedure:  CARDIOVERSION;  Surgeon: [name], MD;  Location: [place];  Service: Cardiovascular;  Laterality: N/A;   CARDIOVERSION N/A 02/15/2018   Procedure: CARDIOVERSION;  Surgeon: [name], MD;  Location: [place];  Service: Cardiovascular;  Laterality: N/A;   CARDIOVERSION N/A 04/10/2018   Procedure: CARDIOVERSION;  Surgeon: Josue Hector, MD;  Location: [facility] ENDOSCOPY;  Service: Cardiovascular;  Laterality: N/A;   CESAREAN SECTION     EYE SURGERY      Current Outpatient Medications  Medication Sig Dispense Refill   acetaminophen (TYLENOL) 500 MG tablet Take 500 mg by mouth as needed for moderate pain or headache.      amoxicillin (AMOXIL) 500 MG capsule Take 4 capsules by mouth as needed. 1 hour prior to dental appointments     apixaban (ELIQUIS) 5 MG TABS tablet Take 1 tablet (5 mg total) by mouth 2 (two) times daily. 28 tablet 0   atorvastatin (LIPITOR) 10 MG tablet Take 10 mg by mouth every other day.      bismuth subsalicylate (PEPTO BISMOL) 262 MG/15ML suspension Take 30 mLs by mouth as needed for indigestion.      calcium carbonate (TUMS EX) 750 MG chewable tablet Chew 750 mg by mouth daily.      Cholecalciferol (VITAMIN D-3) 25 MCG (1000 UT) CAPS Take 1,000 Units by mouth daily.     fexofenadine (ALLEGRA) 180 MG tablet Take 180 mg by mouth as needed for allergies or rhinitis.      furosemide (LASIX) 40 MG tablet Take 40 mg by mouth 2 (two)  times daily. On Monday, Wednesday, and Friday only Take 1 tablet other days     hydrocortisone cream 1 % Apply 1 application topically daily as needed for itching.     insulin aspart (NOVOLOG) 100 UNIT/ML injection Inject 25 Units into the skin daily. Via insulin pump     levothyroxine (SYNTHROID) 88 MCG tablet Take 88 mcg by mouth daily.      loperamide (IMODIUM A-D) 2 MG tablet Take 2-4 mg by mouth as needed for diarrhea or loose stools.      metoprolol tartrate (LOPRESSOR) 25 MG tablet Take 12.5 mg by mouth 2 (two) times daily.      Multiple Vitamins-Minerals (MULTIVITAMIN ADULT PO) Take 1 tablet by mouth once a week.     potassium chloride (KLOR-CON) 10 MEQ tablet Take 10 mEq by mouth every other day.     No current facility-administered medications for this visit.    Allergies:   5-alpha reductase inhibitors   Social History:  The patient  reports that she has never smoked. She has never used smokeless tobacco. She reports that she does not drink alcohol.   ROS:  Please see the history of present illness.   All other systems are personally reviewed and negative.    Exam:    Vital Signs:  BP 112/70   Pulse 82   Ht 5\' 7"  (1.702 m)   Wt 198 lb (89.8 kg)   BMI 31.01 kg/m   Well sounding and appearing, alert and conversant, regular work of breathing,  good skin color Eyes- anicteric, neuro- grossly intact, skin- no apparent rash or lesions or cyanosis, mouth- oral mucosa is pink  Labs/Other Tests and Data Reviewed:    Recent Labs: 03/19/2020: BNP 112.0 05/07/2020: ALT 16; Hemoglobin 11.9; Platelets 171 10/15/2020: BUN 29; Creatinine, Ser 1.53; Potassium 4.1; Sodium 139   Wt Readings from Last 3 Encounters:  11/15/20 198 lb (89.8 kg)  10/06/20 203 lb 1.6 oz (92.1 kg)  07/06/20 199 lb (90.3 kg)       ASSESSMENT & PLAN:    1.  Longstanding persistent atrial fibrillation The patient has symptomatic, recurrent  atrial fibrillation. she has failed medical therapy   Therapeutic strategies for afib including medicine and ablation were discussed in detail with the patient today. Risk, benefits, and alternatives to EP study and radiofrequency ablation for afib were also discussed in detail today. These risks include but are not limited to stroke, bleeding, vascular damage, tamponade, perforation, damage to the esophagus, lungs, and other structures, pulmonary vein stenosis, worsening renal function, and death. The patient understands these risk and wishes to proceed.  We will therefore proceed with catheter  ablation at the next available time.  Carto, ICE, anesthesia are requested for the procedure.  Will also obtain cardiac CT prior to the procedure to exclude LAA thrombus and further evaluate atrial anatomy.     Patient Risk:  after full review of this patients clinical status, I feel that they are at moderate risk at this time.  Today, I have spent 20 minutes with the patient with telehealth technology discussing arrhythmia management .    SignedThompson Grayer, MD  11/15/2020 3:38 PM     [address] [place] [address] [phone] (office) [phone] (fax)

## 2020-11-15 NOTE — Telephone Encounter (Signed)
Patient picked ablation date and lab date. Advised once everything is set up and instructions are sent over mychart, will call patient and go over everything.  Verbalized understanding and agreement.

## 2020-11-17 ENCOUNTER — Encounter: Payer: Self-pay | Admitting: *Deleted

## 2020-11-18 NOTE — Telephone Encounter (Signed)
Went over CT and ablation instructions.  Verbalized understanding.

## 2020-12-03 DIAGNOSIS — E1022 Type 1 diabetes mellitus with diabetic chronic kidney disease: Secondary | ICD-10-CM | POA: Diagnosis not present

## 2020-12-03 DIAGNOSIS — E1165 Type 2 diabetes mellitus with hyperglycemia: Secondary | ICD-10-CM | POA: Diagnosis not present

## 2020-12-03 DIAGNOSIS — Z23 Encounter for immunization: Secondary | ICD-10-CM | POA: Diagnosis not present

## 2020-12-03 DIAGNOSIS — N1832 Chronic kidney disease, stage 3b: Secondary | ICD-10-CM | POA: Diagnosis not present

## 2020-12-03 DIAGNOSIS — E1042 Type 1 diabetes mellitus with diabetic polyneuropathy: Secondary | ICD-10-CM | POA: Diagnosis not present

## 2020-12-03 DIAGNOSIS — Z794 Long term (current) use of insulin: Secondary | ICD-10-CM | POA: Diagnosis not present

## 2020-12-03 DIAGNOSIS — E11319 Type 2 diabetes mellitus with unspecified diabetic retinopathy without macular edema: Secondary | ICD-10-CM | POA: Diagnosis not present

## 2020-12-03 DIAGNOSIS — J209 Acute bronchitis, unspecified: Secondary | ICD-10-CM | POA: Diagnosis not present

## 2020-12-03 DIAGNOSIS — E039 Hypothyroidism, unspecified: Secondary | ICD-10-CM | POA: Diagnosis not present

## 2020-12-09 ENCOUNTER — Other Ambulatory Visit: Payer: Self-pay

## 2020-12-09 ENCOUNTER — Encounter (INDEPENDENT_AMBULATORY_CARE_PROVIDER_SITE_OTHER): Payer: Medicare Other | Admitting: Ophthalmology

## 2020-12-09 DIAGNOSIS — E103591 Type 1 diabetes mellitus with proliferative diabetic retinopathy without macular edema, right eye: Secondary | ICD-10-CM | POA: Diagnosis not present

## 2020-12-09 DIAGNOSIS — H35033 Hypertensive retinopathy, bilateral: Secondary | ICD-10-CM | POA: Diagnosis not present

## 2020-12-09 DIAGNOSIS — I10 Essential (primary) hypertension: Secondary | ICD-10-CM

## 2020-12-09 DIAGNOSIS — E103512 Type 1 diabetes mellitus with proliferative diabetic retinopathy with macular edema, left eye: Secondary | ICD-10-CM | POA: Diagnosis not present

## 2020-12-09 DIAGNOSIS — H43813 Vitreous degeneration, bilateral: Secondary | ICD-10-CM | POA: Diagnosis not present

## 2020-12-13 ENCOUNTER — Other Ambulatory Visit: Payer: Medicare Other | Admitting: *Deleted

## 2020-12-13 ENCOUNTER — Other Ambulatory Visit: Payer: Self-pay

## 2020-12-13 DIAGNOSIS — I4891 Unspecified atrial fibrillation: Secondary | ICD-10-CM

## 2020-12-13 DIAGNOSIS — Z01818 Encounter for other preprocedural examination: Secondary | ICD-10-CM | POA: Diagnosis not present

## 2020-12-13 LAB — BASIC METABOLIC PANEL
BUN/Creatinine Ratio: 22 (ref 12–28)
BUN: 31 mg/dL — ABNORMAL HIGH (ref 8–27)
CO2: 26 mmol/L (ref 20–29)
Calcium: 10 mg/dL (ref 8.7–10.3)
Chloride: 100 mmol/L (ref 96–106)
Creatinine, Ser: 1.4 mg/dL — ABNORMAL HIGH (ref 0.57–1.00)
Glucose: 145 mg/dL — ABNORMAL HIGH (ref 70–99)
Potassium: 4.1 mmol/L (ref 3.5–5.2)
Sodium: 136 mmol/L (ref 134–144)
eGFR: 40 mL/min/{1.73_m2} — ABNORMAL LOW (ref >59)

## 2020-12-13 LAB — CBC WITH DIFFERENTIAL/PLATELET
Basophils Absolute: 0 10*3/uL (ref 0.0–0.2)
Basos: 0 %
EOS (ABSOLUTE): 0.1 10*3/uL (ref 0.0–0.4)
Eos: 2 %
Hematocrit: 34.1 % (ref 34.0–46.6)
Hemoglobin: 11.4 g/dL (ref 11.1–15.9)
Lymphocytes Absolute: 1 10*3/uL (ref 0.7–3.1)
Lymphs: 16 %
MCH: 30.2 pg (ref 26.6–33.0)
MCHC: 33.4 g/dL (ref 31.5–35.7)
MCV: 91 fL (ref 79–97)
Monocytes Absolute: 0.4 10*3/uL (ref 0.1–0.9)
Monocytes: 7 %
Neutrophils Absolute: 4.8 10*3/uL (ref 1.4–7.0)
Neutrophils: 75 %
Platelets: 216 10*3/uL (ref 150–450)
RBC: 3.77 x10E6/uL (ref 3.77–5.28)
RDW: 13.9 % (ref 11.7–15.4)
WBC: 6.4 10*3/uL (ref 3.4–10.8)

## 2020-12-29 ENCOUNTER — Telehealth (HOSPITAL_COMMUNITY): Payer: Self-pay | Admitting: Emergency Medicine

## 2020-12-29 NOTE — Telephone Encounter (Signed)
Reaching out to patient to offer assistance regarding upcoming cardiac imaging study; pt verbalizes understanding of appt date/time, parking situation and where to check in, pre-test NPO status and medications ordered, and verified current allergies; name and call back number provided for further questions should they arise Courtney Bond RN Navigator Cardiac Imaging [facility] [phone] office [phone] cell   Difficult iv  Extra metop 2 hr prior to scan Arrival 1p

## 2020-12-29 NOTE — Telephone Encounter (Signed)
Attempted to call patient regarding upcoming cardiac CT appointment. °Left message on voicemail with name and callback number °Lamyia Cdebaca RN Navigator Cardiac Imaging °[facility] °[phone] Office °[phone] Cell ° °

## 2020-12-30 ENCOUNTER — Other Ambulatory Visit: Payer: Self-pay

## 2020-12-30 ENCOUNTER — Ambulatory Visit (HOSPITAL_COMMUNITY)
Admission: RE | Admit: 2020-12-30 | Discharge: 2020-12-30 | Disposition: A | Discharge status: Home / Self Care | Payer: Medicare Other | Source: Ambulatory Visit | Attending: Internal Medicine | Admitting: Internal Medicine

## 2020-12-30 DIAGNOSIS — I4891 Unspecified atrial fibrillation: Secondary | ICD-10-CM | POA: Insufficient documentation

## 2020-12-30 MED ORDER — IOHEXOL 350 MG/ML SOLN
100.0000 mL | Freq: Once | INTRAVENOUS | Status: AC | PRN
  Administered 2020-12-30: 100 mL via INTRAVENOUS

## 2021-01-01 ENCOUNTER — Other Ambulatory Visit: Payer: Self-pay | Admitting: Cardiovascular Disease

## 2021-01-04 DIAGNOSIS — N1832 Chronic kidney disease, stage 3b: Secondary | ICD-10-CM | POA: Diagnosis not present

## 2021-01-04 DIAGNOSIS — E11319 Type 2 diabetes mellitus with unspecified diabetic retinopathy without macular edema: Secondary | ICD-10-CM | POA: Diagnosis not present

## 2021-01-04 DIAGNOSIS — E1042 Type 1 diabetes mellitus with diabetic polyneuropathy: Secondary | ICD-10-CM | POA: Diagnosis not present

## 2021-01-04 DIAGNOSIS — E039 Hypothyroidism, unspecified: Secondary | ICD-10-CM | POA: Diagnosis not present

## 2021-01-04 DIAGNOSIS — Z794 Long term (current) use of insulin: Secondary | ICD-10-CM | POA: Diagnosis not present

## 2021-01-04 DIAGNOSIS — E1022 Type 1 diabetes mellitus with diabetic chronic kidney disease: Secondary | ICD-10-CM | POA: Diagnosis not present

## 2021-01-05 ENCOUNTER — Encounter: Payer: Self-pay | Admitting: Internal Medicine

## 2021-01-05 NOTE — Pre-Procedure Instructions (Signed)
Instructed patient on the following items: Arrival time 0830 Nothing to eat or drink after midnight No meds AM of procedure Responsible person to drive you home and stay with you for 24 hrs  Have you missed any doses of anti-coagulant Eliquis- hasn't missed any doses   

## 2021-01-06 ENCOUNTER — Encounter (HOSPITAL_COMMUNITY)
Admission: RE | Disposition: A | Discharge status: Home / Self Care | Payer: Self-pay | Source: Home / Self Care | Attending: Internal Medicine

## 2021-01-06 ENCOUNTER — Ambulatory Visit (HOSPITAL_COMMUNITY): Payer: Medicare Other | Admitting: Anesthesiology

## 2021-01-06 ENCOUNTER — Ambulatory Visit (HOSPITAL_COMMUNITY)
Admission: RE | Admit: 2021-01-06 | Discharge: 2021-01-06 | Disposition: A | Discharge status: Home / Self Care | Payer: Medicare Other | Attending: Internal Medicine | Admitting: Internal Medicine

## 2021-01-06 ENCOUNTER — Other Ambulatory Visit: Payer: Self-pay

## 2021-01-06 DIAGNOSIS — E119 Type 2 diabetes mellitus without complications: Secondary | ICD-10-CM | POA: Diagnosis not present

## 2021-01-06 DIAGNOSIS — I5033 Acute on chronic diastolic (congestive) heart failure: Secondary | ICD-10-CM | POA: Diagnosis not present

## 2021-01-06 DIAGNOSIS — R001 Bradycardia, unspecified: Secondary | ICD-10-CM | POA: Insufficient documentation

## 2021-01-06 DIAGNOSIS — I484 Atypical atrial flutter: Secondary | ICD-10-CM | POA: Insufficient documentation

## 2021-01-06 DIAGNOSIS — I4819 Other persistent atrial fibrillation: Secondary | ICD-10-CM | POA: Diagnosis not present

## 2021-01-06 DIAGNOSIS — I110 Hypertensive heart disease with heart failure: Secondary | ICD-10-CM | POA: Diagnosis not present

## 2021-01-06 DIAGNOSIS — I4811 Longstanding persistent atrial fibrillation: Secondary | ICD-10-CM | POA: Insufficient documentation

## 2021-01-06 DIAGNOSIS — I2510 Atherosclerotic heart disease of native coronary artery without angina pectoris: Secondary | ICD-10-CM | POA: Diagnosis not present

## 2021-01-06 HISTORY — PX: ATRIAL FIBRILLATION ABLATION: EP1191

## 2021-01-06 LAB — GLUCOSE, CAPILLARY
Glucose-Capillary: 212 mg/dL — ABNORMAL HIGH (ref 70–99)
Glucose-Capillary: 177 mg/dL — ABNORMAL HIGH (ref 70–99)
Glucose-Capillary: 174 mg/dL — ABNORMAL HIGH (ref 70–99)

## 2021-01-06 LAB — POCT ACTIVATED CLOTTING TIME
Activated Clotting Time: 335 seconds
Activated Clotting Time: 341 seconds

## 2021-01-06 SURGERY — ATRIAL FIBRILLATION ABLATION
Anesthesia: General

## 2021-01-06 MED ORDER — SODIUM CHLORIDE 0.9 % IV SOLN: 250.0000 mL | INTRAVENOUS | Status: DC | PRN

## 2021-01-06 MED ORDER — ONDANSETRON HCL 4 MG/2ML IJ SOLN: 4.0000 mg | Freq: Four times a day (QID) | INTRAMUSCULAR | Status: DC | PRN

## 2021-01-06 MED ORDER — SODIUM CHLORIDE 0.9 % IV SOLN: INTRAVENOUS | Status: DC

## 2021-01-06 MED ORDER — HEPARIN SODIUM (PORCINE) 1000 UNIT/ML IJ SOLN
INTRAMUSCULAR | Status: AC
  Filled 2021-01-06: qty 20

## 2021-01-06 MED ORDER — ACETAMINOPHEN 325 MG PO TABS: 650.0000 mg | ORAL_TABLET | ORAL | Status: DC | PRN

## 2021-01-06 MED ORDER — APIXABAN 5 MG PO TABS
5.0000 mg | ORAL_TABLET | ORAL | Status: AC
  Administered 2021-01-06: 5 mg via ORAL
  Filled 2021-01-06: qty 1

## 2021-01-06 MED ORDER — SODIUM CHLORIDE 0.9% FLUSH: 3.0000 mL | Freq: Two times a day (BID) | INTRAVENOUS | Status: DC

## 2021-01-06 MED ORDER — FENTANYL CITRATE (PF) 100 MCG/2ML IJ SOLN
INTRAMUSCULAR | Status: DC | PRN
  Administered 2021-01-06 (×2): 50 ug via INTRAVENOUS

## 2021-01-06 MED ORDER — FENTANYL CITRATE (PF) 100 MCG/2ML IJ SOLN
INTRAMUSCULAR | Status: AC
  Filled 2021-01-06: qty 2

## 2021-01-06 MED ORDER — PHENYLEPHRINE HCL-NACL 20-0.9 MG/250ML-% IV SOLN
INTRAVENOUS | Status: DC | PRN
  Administered 2021-01-06: 50 ug/min via INTRAVENOUS

## 2021-01-06 MED ORDER — PANTOPRAZOLE SODIUM 40 MG PO TBEC: 40.0000 mg | DELAYED_RELEASE_TABLET | Freq: Every day | ORAL | 0 refills | Status: DC

## 2021-01-06 MED ORDER — HEPARIN (PORCINE) IN NACL 1000-0.9 UT/500ML-% IV SOLN
INTRAVENOUS | Status: AC
  Filled 2021-01-06: qty 500

## 2021-01-06 MED ORDER — HEPARIN SODIUM (PORCINE) 1000 UNIT/ML IJ SOLN
INTRAMUSCULAR | Status: DC | PRN
  Administered 2021-01-06 (×2): 1000 [IU] via INTRAVENOUS

## 2021-01-06 MED ORDER — SUGAMMADEX SODIUM 200 MG/2ML IV SOLN
INTRAVENOUS | Status: DC | PRN
  Administered 2021-01-06 (×2): 200 mg via INTRAVENOUS

## 2021-01-06 MED ORDER — ONDANSETRON HCL 4 MG/2ML IJ SOLN
INTRAMUSCULAR | Status: DC | PRN
  Administered 2021-01-06: 4 mg via INTRAVENOUS

## 2021-01-06 MED ORDER — PROPOFOL 10 MG/ML IV BOLUS
INTRAVENOUS | Status: DC | PRN
  Administered 2021-01-06: 110 mg via INTRAVENOUS

## 2021-01-06 MED ORDER — HYDROCODONE-ACETAMINOPHEN 5-325 MG PO TABS: 1.0000 | ORAL_TABLET | ORAL | Status: DC | PRN

## 2021-01-06 MED ORDER — ROCURONIUM BROMIDE 10 MG/ML (PF) SYRINGE
PREFILLED_SYRINGE | INTRAVENOUS | Status: DC | PRN
  Administered 2021-01-06: 40 mg via INTRAVENOUS
  Administered 2021-01-06: 60 mg via INTRAVENOUS

## 2021-01-06 MED ORDER — SODIUM CHLORIDE 0.9% FLUSH: 3.0000 mL | INTRAVENOUS | Status: DC | PRN

## 2021-01-06 MED ORDER — PROTAMINE SULFATE 10 MG/ML IV SOLN
INTRAVENOUS | Status: DC | PRN
  Administered 2021-01-06: 10 mg via INTRAVENOUS
  Administered 2021-01-06 (×4): 5 mg via INTRAVENOUS
  Administered 2021-01-06: 10 mg via INTRAVENOUS

## 2021-01-06 MED ORDER — HEPARIN (PORCINE) IN NACL 2000-0.9 UNIT/L-% IV SOLN
INTRAVENOUS | Status: AC
  Filled 2021-01-06: qty 1000

## 2021-01-06 MED ORDER — LIDOCAINE 2% (20 MG/ML) 5 ML SYRINGE
INTRAMUSCULAR | Status: DC | PRN
  Administered 2021-01-06: 60 mg via INTRAVENOUS

## 2021-01-06 SURGICAL SUPPLY — 17 items
CATH OCTARAY 2.0 F 3-3-3-3-3 (CATHETERS) ×1 IMPLANT
CATH SMTCH THERMOCOOL SF DF (CATHETERS) ×1 IMPLANT
CATH SOUNDSTAR ECO 8FR (CATHETERS) ×1 IMPLANT
CATH WEBSTER BI DIR CS D-F CRV (CATHETERS) ×1 IMPLANT
CLOSURE PERCLOSE PROSTYLE (VASCULAR PRODUCTS) ×4 IMPLANT
COVER SWIFTLINK CONNECTOR (BAG) ×3 IMPLANT
NDL BAYLIS TRANSSEPTAL 71CM (NEEDLE) IMPLANT
NEEDLE BAYLIS TRANSSEPTAL 71CM (NEEDLE) ×2 IMPLANT
PACK EP LATEX FREE (CUSTOM PROCEDURE TRAY) ×2
PACK EP LF (CUSTOM PROCEDURE TRAY) ×2 IMPLANT
PAD DEFIB RADIO PHYSIO CONN (PAD) ×3 IMPLANT
PATCH CARTO3 (PAD) ×1 IMPLANT
SHEATH PINNACLE 7F 10CM (SHEATH) ×2 IMPLANT
SHEATH PINNACLE 9F 10CM (SHEATH) ×1 IMPLANT
SHEATH PROBE COVER 6X72 (BAG) ×1 IMPLANT
SHEATH SWARTZ TS SL2 63CM 8.5F (SHEATH) ×1 IMPLANT
TUBING SMART ABLATE COOLFLOW (TUBING) ×1 IMPLANT

## 2021-01-06 NOTE — Discharge Instructions (Signed)

## 2021-01-06 NOTE — H&P (Signed)
Chief Complaint:  palpitations   History of Present Illness:     Courtney Pearson is a 70 y.o. female who presents today for afib ablation.  Since last being seen in our clinic, the patient reports doing reasonably well.  She continues to have afib.  + palpitations and fatigue.  + occasional SOB.  Today, she denies symptoms of chest pain,  lower extremity edema, dizziness, presyncope, or syncope.  The patient is otherwise without complaint today.        Past Medical History:  Diagnosis Date   Acute on chronic diastolic heart failure ([place]) 03/12/2020   Atypical atrial flutter ([place]) 03/12/2020   CAD in native artery 03/20/2019   Diabetes ([place])     HTN (hypertension)     Hyperlipidemia     Hypothyroidism     Palpitation     Persistent atrial fibrillation ([place]) 04/03/2017   Renal insufficiency     Sick sinus syndrome ([place]) 07/06/2020   Weakness 10/06/2020           Past Surgical History:  Procedure Laterality Date   ATRIAL FIBRILLATION ABLATION N/A 01/01/2018    Procedure: ATRIAL FIBRILLATION ABLATION;  Surgeon: Thompson Grayer, MD;  Location: [place] [facility];  Service: Cardiovascular;  Laterality: N/A;   ATRIAL FIBRILLATION ABLATION N/A 10/31/2018    Procedure: ATRIAL FIBRILLATION ABLATION;  Surgeon: Thompson Grayer, MD;  Location: [place] [facility];  Service: Cardiovascular;  Laterality: N/A;   BREAST BIOPSY       CARDIOVERSION N/A 04/30/2017    Procedure: CARDIOVERSION;  Surgeon: [name], MD;  Location: [place];  Service: Cardiovascular;  Laterality: N/A;   CARDIOVERSION N/A 02/15/2018    Procedure: CARDIOVERSION;  Surgeon: [name], MD;  Location: [place];  Service: Cardiovascular;  Laterality: N/A;   CARDIOVERSION N/A 04/10/2018    Procedure: CARDIOVERSION;  Surgeon: Josue Hector, MD;  Location: [facility] ENDOSCOPY;  Service: Cardiovascular;  Laterality: N/A;   CESAREAN SECTION       EYE SURGERY                Current Outpatient Medications  Medication Sig  Dispense Refill   acetaminophen (TYLENOL) 500 MG tablet Take 500 mg by mouth as needed for moderate pain or headache.        amoxicillin (AMOXIL) 500 MG capsule Take 4 capsules by mouth as needed. 1 hour prior to dental appointments       apixaban (ELIQUIS) 5 MG TABS tablet Take 1 tablet (5 mg total) by mouth 2 (two) times daily. 28 tablet 0   atorvastatin (LIPITOR) 10 MG tablet Take 10 mg by mouth every other day.        bismuth subsalicylate (PEPTO BISMOL) 262 MG/15ML suspension Take 30 mLs by mouth as needed for indigestion.        calcium carbonate (TUMS EX) 750 MG chewable tablet Chew 750 mg by mouth daily.        Cholecalciferol (VITAMIN D-3) 25 MCG (1000 UT) CAPS Take 1,000 Units by mouth daily.       fexofenadine (ALLEGRA) 180 MG tablet Take 180 mg by mouth as needed for allergies or rhinitis.        furosemide (LASIX) 40 MG tablet Take 40 mg by mouth 2 (two) times daily. On Monday, Wednesday, and Friday only Take 1 tablet other days       hydrocortisone cream 1 % Apply 1 application topically daily as needed for itching.       insulin aspart (  NOVOLOG) 100 UNIT/ML injection Inject 25 Units into the skin daily. Via insulin pump       levothyroxine (SYNTHROID) 88 MCG tablet Take 88 mcg by mouth daily.        loperamide (IMODIUM A-D) 2 MG tablet Take 2-4 mg by mouth as needed for diarrhea or loose stools.        metoprolol tartrate (LOPRESSOR) 25 MG tablet Take 12.5 mg by mouth 2 (two) times daily.       Multiple Vitamins-Minerals (MULTIVITAMIN ADULT PO) Take 1 tablet by mouth once a week.       potassium chloride (KLOR-CON) 10 MEQ tablet Take 10 mEq by mouth every other day.        No current facility-administered medications for this visit.      Allergies:   5-alpha reductase inhibitors    Social History:  The patient  reports that she has never smoked. She has never used smokeless tobacco. She reports that she does not drink alcohol.    ROS:  Please see the history of present  illness.   All other systems are personally reviewed and negative.      Exam:   Physical Exam: Vitals:   01/06/21 0846  BP: 135/75  Pulse: 74  Temp: 98 F (36.7 C)  TempSrc: Oral  SpO2: 100%  Weight: 90.7 kg  Height: 5\' 7"  (1.702 m)    GEN- The patient is well appearing, alert and oriented x 3 today.   Head- normocephalic, atraumatic Eyes-  Sclera clear, conjunctiva pink Ears- hearing intact Oropharynx- clear Neck- supple, Lungs-  normal work of breathing Heart- iRRR GI- soft  Extremities- no clubbing, cyanosis, or edema, groin is without hematoma/ bruit Neuro- strength and sensation are intact    Labs/Other Tests and Data Reviewed:     Recent Labs: 03/19/2020: BNP 112.0 05/07/2020: ALT 16; Hemoglobin 11.9; Platelets 171 10/15/2020: BUN 29; Creatinine, Ser 1.53; Potassium 4.1; Sodium 139       Wt Readings from Last 3 Encounters:  11/15/20 198 lb (89.8 kg)  10/06/20 203 lb 1.6 oz (92.1 kg)  07/06/20 199 lb (90.3 kg)         ASSESSMENT & PLAN:     1.  Longstanding persistent atrial fibrillation The patient has symptomatic, recurrent  atrial fibrillation. she has failed medical therapy  She underwent afib ablation by me in 2019 (note reviewed)   Risk, benefits, and alternatives to EP study and radiofrequency ablation for afib were again discussed in detail today. These risks include but are not limited to stroke, bleeding, vascular damage, tamponade, perforation, damage to the esophagus, lungs, and other structures, pulmonary vein stenosis, worsening renal function, and death. The patient understands these risk and wishes to proceed.    Cardiac CT reviewed at length with the patient today.  she reports compliance with Media without interruption.  Thompson Grayer MD, [place] 01/06/2021 10:26 AM

## 2021-01-06 NOTE — Anesthesia Procedure Notes (Signed)
Procedure Name: Intubation Date/Time: 01/06/2021 11:03 AM Performed by: Georgia Duff, CRNA Pre-anesthesia Checklist: Patient identified, Emergency Drugs available, Suction available and Patient being monitored Patient Re-evaluated:Patient Re-evaluated prior to induction Oxygen Delivery Method: Circle System Utilized Preoxygenation: Pre-oxygenation with 100% oxygen Induction Type: IV induction Ventilation: Mask ventilation without difficulty Laryngoscope Size: Miller and 2 Grade View: Grade I Tube type: Oral Tube size: 7.5 mm Number of attempts: 1 Airway Equipment and Method: Stylet and Oral airway Placement Confirmation: positive ETCO2, breath sounds checked- equal and bilateral and ETT inserted through vocal cords under direct vision Secured at: 21 cm Tube secured with: Tape Dental Injury: Teeth and Oropharynx as per pre-operative assessment

## 2021-01-06 NOTE — Anesthesia Preprocedure Evaluation (Signed)
Anesthesia Evaluation  Patient identified by MRN, date of birth, ID band Patient awake    Reviewed: Allergy & Precautions, H&P , NPO status , Patient's Chart, lab work & pertinent test results  Airway Mallampati: II   Neck ROM: full    Dental   Pulmonary shortness of breath,    breath sounds clear to auscultation       Cardiovascular hypertension, + CAD  + dysrhythmias Atrial Fibrillation  Rhythm:irregular Rate:Normal     Neuro/Psych    GI/Hepatic   Endo/Other  diabetes, Type 2Hypothyroidism   Renal/GU      Musculoskeletal   Abdominal   Peds  Hematology   Anesthesia Other Findings   Reproductive/Obstetrics                             Anesthesia Physical Anesthesia Plan  ASA: 3  Anesthesia Plan: General   Post-op Pain Management:    Induction: Intravenous  PONV Risk Score and Plan: 3 and Ondansetron, Dexamethasone and Treatment may vary due to age or medical condition  Airway Management Planned: Oral ETT  Additional Equipment:   Intra-op Plan:   Post-operative Plan: Extubation in OR  Informed Consent: I have reviewed the patients History and Physical, chart, labs and discussed the procedure including the risks, benefits and alternatives for the proposed anesthesia with the patient or authorized representative who has indicated his/her understanding and acceptance.     Dental advisory given  Plan Discussed with: CRNA, Anesthesiologist and Surgeon  Anesthesia Plan Comments:         Anesthesia Quick Evaluation

## 2021-01-06 NOTE — Transfer of Care (Signed)
Immediate Anesthesia Transfer of Care Note  Patient: Courtney Pearson  Procedure(s) Performed: ATRIAL FIBRILLATION ABLATION  Patient Location: PACU  Anesthesia Type:General  Level of Consciousness: drowsy and patient cooperative  Airway & Oxygen Therapy: Patient Spontanous Breathing and Patient connected to nasal cannula oxygen  Post-op Assessment: Report given to RN and Post -op Vital signs reviewed and stable  Post vital signs: Reviewed and stable  Last Vitals:  Vitals Value Taken Time  BP 119/75 01/06/21 1305  Temp    Pulse 57 01/06/21 1307  Resp 18 01/06/21 1307  SpO2 100 % 01/06/21 1307  Vitals shown include unvalidated device data.  Last Pain:  Vitals:   01/06/21 0918  TempSrc:   PainSc: 0-No pain         Complications: No notable events documented.

## 2021-01-07 ENCOUNTER — Encounter (HOSPITAL_COMMUNITY): Payer: Self-pay | Admitting: Internal Medicine

## 2021-01-08 NOTE — Anesthesia Postprocedure Evaluation (Signed)
Anesthesia Post Note  Patient: Courtney Pearson  Procedure(s) Performed: ATRIAL FIBRILLATION ABLATION     Patient location during evaluation: Cath Lab Anesthesia Type: General Level of consciousness: awake and alert Pain management: pain level controlled Vital Signs Assessment: post-procedure vital signs reviewed and stable Respiratory status: spontaneous breathing, nonlabored ventilation, respiratory function stable and patient connected to nasal cannula oxygen Cardiovascular status: blood pressure returned to baseline and stable Postop Assessment: no apparent nausea or vomiting Anesthetic complications: no   No notable events documented.  Last Vitals:  Vitals:   01/06/21 1525 01/06/21 1630  BP: (!) 132/42 (!) 132/42  Pulse: 74 (!) 46  Resp: 15 15  Temp:    SpO2: 95% 95%    Last Pain:  Vitals:   01/06/21 1525  TempSrc:   PainSc: 0-No pain                 Azra Abrell S

## 2021-01-10 ENCOUNTER — Encounter (HOSPITAL_COMMUNITY): Payer: Self-pay | Admitting: Internal Medicine

## 2021-01-27 ENCOUNTER — Encounter: Payer: Self-pay | Admitting: Internal Medicine

## 2021-01-28 ENCOUNTER — Other Ambulatory Visit (HOSPITAL_COMMUNITY): Payer: Self-pay | Admitting: *Deleted

## 2021-01-30 DIAGNOSIS — Z20822 Contact with and (suspected) exposure to covid-19: Secondary | ICD-10-CM | POA: Diagnosis not present

## 2021-02-08 ENCOUNTER — Encounter (HOSPITAL_COMMUNITY): Payer: Self-pay | Admitting: Nurse Practitioner

## 2021-02-08 ENCOUNTER — Other Ambulatory Visit: Payer: Self-pay

## 2021-02-08 ENCOUNTER — Ambulatory Visit (HOSPITAL_COMMUNITY)
Admission: RE | Admit: 2021-02-08 | Discharge: 2021-02-08 | Disposition: A | Discharge status: Home / Self Care | Payer: Medicare Other | Source: Ambulatory Visit | Attending: Nurse Practitioner | Admitting: Nurse Practitioner

## 2021-02-08 VITALS — BP 150/46 | HR 69 | Ht 67.0 in | Wt 198.0 lb

## 2021-02-08 DIAGNOSIS — D6869 Other thrombophilia: Secondary | ICD-10-CM

## 2021-02-08 DIAGNOSIS — I4819 Other persistent atrial fibrillation: Secondary | ICD-10-CM | POA: Insufficient documentation

## 2021-02-08 NOTE — Progress Notes (Signed)
Primary Care Physician: Jonathon Jordan, MD Referring Physician: Dr. Lucina Pearson is a 71 y.o. female with a h/o afib, now one month s/p ablation. She has not noted any afib. A few Pac's were noted on  her [place] mobile. No swallowing or groin issues. Her BB was stopped after ablation in [place] for bradycardia.   Today, she denies symptoms of palpitations, chest pain, shortness of breath, orthopnea, PND, lower extremity edema, dizziness, presyncope, syncope, or neurologic sequela. The patient is tolerating medications without difficulties and is otherwise without complaint today.   Past Medical History:  Diagnosis Date   Acute on chronic diastolic heart failure ([place]) 03/12/2020   Atypical atrial flutter ([place]) 03/12/2020   CAD in native artery 03/20/2019   Diabetes ([place])    HTN (hypertension)    Hyperlipidemia    Hypothyroidism    Palpitation    Persistent atrial fibrillation ([place]) 04/03/2017   Renal insufficiency    Sick sinus syndrome ([place]) 07/06/2020   Weakness 10/06/2020   Past Surgical History:  Procedure Laterality Date   ATRIAL FIBRILLATION ABLATION N/A 01/01/2018   Procedure: ATRIAL FIBRILLATION ABLATION;  Surgeon: Thompson Grayer, MD;  Location: [facility];  Service: Cardiovascular;  Laterality: N/A;   ATRIAL FIBRILLATION ABLATION N/A 10/31/2018   Procedure: ATRIAL FIBRILLATION ABLATION;  Surgeon: Thompson Grayer, MD;  Location: [facility];  Service: Cardiovascular;  Laterality: N/A;   ATRIAL FIBRILLATION ABLATION N/A 01/06/2021   Procedure: ATRIAL FIBRILLATION ABLATION;  Surgeon: Thompson Grayer, MD;  Location: [facility];  Service: Cardiovascular;  Laterality: N/A;   BREAST BIOPSY     CARDIOVERSION N/A 04/30/2017   Procedure: CARDIOVERSION;  Surgeon: [name], MD;  Location: [facility];  Service: Cardiovascular;  Laterality: N/A;   CARDIOVERSION N/A 02/15/2018   Procedure: CARDIOVERSION;  Surgeon: [name], MD;  Location: [facility];  Service: Cardiovascular;  Laterality: N/A;   CARDIOVERSION N/A 04/10/2018   Procedure: CARDIOVERSION;  Surgeon: Josue Hector, MD;  Location: [facility];  Service: Cardiovascular;  Laterality: N/A;   CESAREAN SECTION     EYE SURGERY      Current Outpatient Medications  Medication Sig Dispense Refill   acetaminophen (TYLENOL) 500 MG tablet Take 500 mg by mouth every 8 (eight) hours as needed for moderate pain or headache.     amoxicillin (AMOXIL) 500 MG capsule Take 4 capsules by mouth See admin instructions. 1 hour prior to dental appointments     apixaban (ELIQUIS) 5 MG TABS tablet Take 1 tablet (5 mg total) by mouth 2 (two) times daily. 28 tablet 0   atorvastatin (LIPITOR) 10 MG tablet Take 10 mg by mouth every other day.      bismuth subsalicylate (PEPTO BISMOL) 262 MG/15ML suspension Take 30 mLs by mouth daily as needed for indigestion.     calcium carbonate (TUMS EX) 750 MG chewable tablet Chew 750 mg by mouth daily.      Cholecalciferol (VITAMIN D-3) 25 MCG (1000 UT) CAPS Take 1,000 Units by mouth daily.     fexofenadine (ALLEGRA) 180 MG tablet Take 180 mg by mouth as needed for allergies or rhinitis.      furosemide (LASIX) 40 MG tablet Take 40-80 mg by mouth See admin instructions. 80 mg on Monday, Wednesday, and Friday only Take 40 mg all other days     hydrocortisone cream 1 % Apply 1 application topically daily as needed for itching.     insulin aspart (NOVOLOG) 100 UNIT/ML  injection Inject 25 Units into the skin daily. Via insulin pump     levothyroxine (SYNTHROID) 88 MCG tablet Take 88 mcg by mouth daily before breakfast.     loperamide (IMODIUM A-D) 2 MG tablet Take 2-4 mg by mouth as needed for diarrhea or loose stools.      Multiple Vitamins-Minerals (MULTIVITAMIN ADULT PO) Take 1 tablet by mouth once a week.     pantoprazole (PROTONIX) 40 MG tablet Take 1 tablet (40 mg total) by mouth daily. 45 tablet 0   No current facility-administered medications for this  encounter.    No Known Allergies  Social History   Socioeconomic History   Marital status: Married    Spouse name: Not on file   Number of children: 2   Years of education: Not on file   Highest education level: Not on file  Occupational History   Not on file  Tobacco Use   Smoking status: Never   Smokeless tobacco: Never  Vaping Use   Vaping Use: Never used  Substance and Sexual Activity   Alcohol use: No   Drug use: Not on file   Sexual activity: Not on file  Other Topics Concern   Not on file  Social History Narrative   Not on file   Social Determinants of Health   Financial Resource Strain: Low Risk    Difficulty of Paying Living Expenses: Not hard at all  Food Insecurity: No Food Insecurity   Worried About Running Out of Food in the Last Year: Never true   [place] in the Last Year: Never true  Transportation Needs: No Transportation Needs   Lack of Transportation (Medical): No   Lack of Transportation (Non-Medical): No  Physical Activity: Inactive   Days of Exercise per Week: 0 days   Minutes of Exercise per Session: 0 min  Stress: Not on file  Social Connections: Not on file  Intimate Partner Violence: Not on file    Family History  Problem Relation Age of Onset   Heart disease Other        No family history   Hypertension Mother    Osteoarthritis Mother    Osteoporosis Mother    Anemia Father    Asthma Sister    Osteoarthritis Sister    OCD Sister    Leukemia Brother    Lupus Child     ROS- All systems are reviewed and negative except as per the HPI above  Physical Exam: Vitals:   02/08/21 1331  BP: (!) 150/46  Pulse: 69  Weight: 89.8 kg  Height: 5\' 7"  (1.702 m)   Wt Readings from Last 3 Encounters:  02/08/21 89.8 kg  01/06/21 90.7 kg  11/15/20 89.8 kg    Labs: Lab Results  Component Value Date   NA 136 12/13/2020   K 4.1 12/13/2020   CL 100 12/13/2020   CO2 26 12/13/2020   GLUCOSE 145 (H) 12/13/2020   BUN 31 (H)  12/13/2020   CREATININE 1.40 (H) 12/13/2020   CALCIUM 10.0 12/13/2020   MG 1.9 12/18/2018   No results found for: INR No results found for: CHOL, HDL, LDLCALC, TRIG   GEN- The patient is well appearing, alert and oriented x 3 today.   Head- normocephalic, atraumatic Eyes-  Sclera clear, conjunctiva pink Ears- hearing intact Oropharynx- clear Neck- supple, no JVP Lymph- no cervical lymphadenopathy Lungs- Clear to ausculation bilaterally, normal work of breathing Heart- Regular rate and rhythm, no murmurs, rubs or gallops, PMI  not laterally displaced GI- soft, NT, ND, + BS Extremities- no clubbing, cyanosis, or edema MS- no significant deformity or atrophy Skin- no rash or lesion Psych- euthymic mood, full affect Neuro- strength and sensation are intact  EKG-NSR at 69 bpm, pr int 190 ms, qrs int 86 ms, qt 385 ms     Assessment and Plan:  1. Persistent Afib S/p second ablation one month ago She is staying in [place]  BB stopped after ablation for symptomatic bradycardia in the 40's   2. CHA2DS2VASc  score of 6 Continue eliquis 5 mg bid  Reminded not to interrupt anticoagulation during the 3 month recovery period    F/u with Dr. Rayann Heman at the 3 month period after ablation  Butch Penny C. Rolen Conger, [facility] [address] [place], [place] [address] [phone]

## 2021-02-10 ENCOUNTER — Encounter (HOSPITAL_COMMUNITY): Payer: Self-pay | Admitting: Emergency Medicine

## 2021-02-10 ENCOUNTER — Other Ambulatory Visit: Payer: Self-pay

## 2021-02-10 ENCOUNTER — Ambulatory Visit (HOSPITAL_COMMUNITY)
Admission: EM | Admit: 2021-02-10 | Discharge: 2021-02-10 | Disposition: A | Discharge status: Home / Self Care | Payer: Medicare Other | Attending: Family Medicine | Admitting: Family Medicine

## 2021-02-10 DIAGNOSIS — U071 COVID-19: Secondary | ICD-10-CM | POA: Diagnosis not present

## 2021-02-10 DIAGNOSIS — B349 Viral infection, unspecified: Secondary | ICD-10-CM

## 2021-02-10 MED ORDER — BENZONATATE 100 MG PO CAPS: 100.0000 mg | ORAL_CAPSULE | Freq: Three times a day (TID) | ORAL | 0 refills | Status: DC | PRN

## 2021-02-10 MED ORDER — MOLNUPIRAVIR EUA 200MG CAPSULE: 4.0000 | ORAL_CAPSULE | Freq: Two times a day (BID) | ORAL | 0 refills | Status: AC

## 2021-02-10 NOTE — ED Provider Notes (Signed)
[facility]    CSN: [id number] Arrival date & time: 02/10/21  1822      History   Chief Complaint Chief Complaint  Patient presents with   Cough    HPI Courtney Pearson is a 71 y.o. female.   HPI Patient with a medical history significant of heart failure, atrial flutter, diabetes, renal insufficiency, presents today for evaluation following a positive home COVID test.  Patient reports that she was unknowingly exposed to [place] earlier in the week and developed symptoms last night and tested positive today.  She reports soreness of throat, some mild fatigue, low-grade fever and a dry cough.  She denies any shortness of breath or chest pain.  She is in today for antiviral therapy.  Past Medical History:  Diagnosis Date   Acute on chronic diastolic heart failure ([place]) 03/12/2020   Atypical atrial flutter ([place]) 03/12/2020   CAD in native artery 03/20/2019   Diabetes ([place])    HTN (hypertension)    Hyperlipidemia    Hypothyroidism    Palpitation    Persistent atrial fibrillation ([place]) 04/03/2017   Renal insufficiency    Sick sinus syndrome ([place]) 07/06/2020   Weakness 10/06/2020    Patient Active Problem List   Diagnosis Date Noted   Weakness 10/06/2020   Sick sinus syndrome ([place]) 07/06/2020   Acute on chronic diastolic heart failure ([place]) 03/12/2020   Atypical atrial flutter ([place]) 03/12/2020   CAD in native artery 03/20/2019   Persistent atrial fibrillation ([place])    Dyspnea 10/30/2012   Palpitations 10/30/2012   Essential hypertension 10/30/2012   Hyperlipidemia 10/30/2012    Past Surgical History:  Procedure Laterality Date   ATRIAL FIBRILLATION ABLATION N/A 01/01/2018   Procedure: ATRIAL FIBRILLATION ABLATION;  Surgeon: Thompson Grayer, MD;  Location: [facility];  Service: Cardiovascular;  Laterality: N/A;   ATRIAL FIBRILLATION ABLATION N/A 10/31/2018   Procedure: ATRIAL FIBRILLATION ABLATION;  Surgeon: Thompson Grayer, MD;  Location: [facility];   Service: Cardiovascular;  Laterality: N/A;   ATRIAL FIBRILLATION ABLATION N/A 01/06/2021   Procedure: ATRIAL FIBRILLATION ABLATION;  Surgeon: Thompson Grayer, MD;  Location: [facility];  Service: Cardiovascular;  Laterality: N/A;   BREAST BIOPSY     CARDIOVERSION N/A 04/30/2017   Procedure: CARDIOVERSION;  Surgeon: [name], MD;  Location: [facility];  Service: Cardiovascular;  Laterality: N/A;   CARDIOVERSION N/A 02/15/2018   Procedure: CARDIOVERSION;  Surgeon: [name], MD;  Location: [facility];  Service: Cardiovascular;  Laterality: N/A;   CARDIOVERSION N/A 04/10/2018   Procedure: CARDIOVERSION;  Surgeon: Josue Hector, MD;  [facility];  Service: Cardiovascular;  Laterality: N/A;   CESAREAN SECTION     EYE SURGERY      OB History   No obstetric history on file.      Home Medications    Prior to Admission medications   Medication Sig Start Date End Date Taking? Authorizing Provider  benzonatate (TESSALON) 100 MG capsule Take 1-2 capsules (100-200 mg total) by mouth 3 (three) times daily as needed for cough. 02/10/21  Yes Scot Jun, FNP  molnupiravir EUA (LAGEVRIO) 200 mg CAPS capsule Take 4 capsules (800 mg total) by mouth 2 (two) times daily for 5 days. 02/10/21 02/15/21 Yes Scot Jun, FNP  acetaminophen (TYLENOL) 500 MG tablet Take 500 mg by mouth every 8 (eight) hours as needed for moderate pain or headache.    [provider]  amoxicillin (AMOXIL) 500 MG capsule Take 4  capsules by mouth See admin instructions. 1 hour prior to dental appointments Patient not taking: Reported on 02/10/2021 10/08/18   [provider]  apixaban (ELIQUIS) 5 MG TABS tablet Take 1 tablet (5 mg total) by mouth 2 (two) times daily. 09/06/20   Sherran Needs, NP  atorvastatin (LIPITOR) 10 MG tablet Take 10 mg by mouth every other day.  09/20/12   [provider]  bismuth subsalicylate (PEPTO BISMOL) 262 MG/15ML suspension Take  30 mLs by mouth daily as needed for indigestion.    [provider]  calcium carbonate (TUMS EX) 750 MG chewable tablet Chew 750 mg by mouth daily.     [provider]  Cholecalciferol (VITAMIN D-3) 25 MCG (1000 UT) CAPS Take 1,000 Units by mouth daily.    [provider]  fexofenadine (ALLEGRA) 180 MG tablet Take 180 mg by mouth as needed for allergies or rhinitis.     [provider]  furosemide (LASIX) 40 MG tablet Take 40-80 mg by mouth See admin instructions. 80 mg on Monday, Wednesday, and Friday only Take 40 mg all other days    [provider]  hydrocortisone cream 1 % Apply 1 application topically daily as needed for itching.    [provider]  insulin aspart (NOVOLOG) 100 UNIT/ML injection Inject 25 Units into the skin daily. Via insulin pump    [provider]  levothyroxine (SYNTHROID) 88 MCG tablet Take 88 mcg by mouth daily before breakfast. 12/15/18   [provider]  loperamide (IMODIUM A-D) 2 MG tablet Take 2-4 mg by mouth as needed for diarrhea or loose stools.     [provider]  Multiple Vitamins-Minerals (MULTIVITAMIN ADULT PO) Take 1 tablet by mouth once a week.    [provider]  pantoprazole (PROTONIX) 40 MG tablet Take 1 tablet (40 mg total) by mouth daily. 01/06/21 02/20/21  Allred, Jeneen Rinks, MD    Family History Family History  Problem Relation Age of Onset   Heart disease Other        No family history   Hypertension Mother    Osteoarthritis Mother    Osteoporosis Mother    Anemia Father    Asthma Sister    Osteoarthritis Sister    OCD Sister    Leukemia Brother    Lupus Child     Social History Social History   Tobacco Use   Smoking status: Never   Smokeless tobacco: Never  Vaping Use   Vaping Use: Never used  Substance Use Topics   Alcohol use: No   Drug use: Never     Allergies   Patient has no known allergies.   Review of Systems Review of  Systems Pertinent negatives listed in HPI   Physical Exam Triage Vital Signs ED Triage Vitals  Enc Vitals Group     BP 02/10/21 1907 (!) 163/53     Pulse Rate 02/10/21 1907 70     Resp 02/10/21 1907 20     Temp 02/10/21 1907 99.6 F (37.6 C)     Temp Source 02/10/21 1907 Oral     SpO2 02/10/21 1907 97 %     Weight --      Height --      Head Circumference --      Peak Flow --      Pain Score 02/10/21 1904 0     Pain Loc --      Pain Edu? --      Excl. in  GC? --    No data found.  Updated Vital Signs BP (!) 163/53 (BP Location: Right Arm)    Pulse 70    Temp 99.6 F (37.6 C) (Oral)    Resp 20    SpO2 97%   Visual Acuity Right Eye Distance:   Left Eye Distance:   Bilateral Distance:    Right Eye Near:   Left Eye Near:    Bilateral Near:     Physical Exam Constitutional:      Appearance: She is ill-appearing.  HENT:     Head: Normocephalic and atraumatic.  Eyes:     Extraocular Movements: Extraocular movements intact.     Pupils: Pupils are equal, round, and reactive to light.  Cardiovascular:     Rate and Rhythm: Normal rate and regular rhythm.  Pulmonary:     Effort: Pulmonary effort is normal.     Breath sounds: Normal breath sounds.  Musculoskeletal:     Cervical back: Normal range of motion.  Lymphadenopathy:     Cervical: Cervical adenopathy present.  Skin:    General: Skin is warm.     Capillary Refill: Capillary refill takes less than 2 seconds.  Neurological:     General: No focal deficit present.     Mental Status: She is alert and oriented to person, place, and time.     Motor: No weakness.     Gait: Gait normal.  Psychiatric:        Mood and Affect: Mood normal.        Behavior: Behavior normal.        Thought Content: Thought content normal.        Judgment: Judgment normal.     UC Treatments / Results  Labs (all labs ordered are listed, but only abnormal results are displayed) Labs Reviewed - No data to  display  EKG   Radiology No results found.  Procedures Procedures (including critical care time)  Medications Ordered in UC Medications - No data to display  Initial Impression / Assessment and Plan / UC Course  I have reviewed the triage vital signs and the nursing notes.  Pertinent labs & imaging results that were available during my care of the patient were reviewed by me and considered in my medical decision making (see chart for details).    Viral illness secondary to COVID-19 infection Molnipuvir prescribed given patient Eliquis  at current dose is contraindicated for contaminant use with Paxlovid Tessalon Perles given for management of cough.  Patient is a diabetic therefore advised her to closely monitor her blood glucose as COVID can cause severe hyperglycemia.  Advise if blood sugars are 300 or greater persistently and do not respond to current therapy this is indication to go to the emergency department.  Advised if she develops any chest pain, rapid heart rate, or shortness of breath given her underlying conditions this is also indication to go immediately to the emergency department.  Patient advised to continue quarantining for total of 5 days and continue to mask for an additional 5 days when out in public.  Follow-up as needed. Final Clinical Impressions(s) / UC Diagnoses   Final diagnoses:  COVID-19  Viral illness     Discharge Instructions      If you develop any shortness of breath or chest pain- go immediately to the emergency department.       ED Prescriptions     Medication Sig Dispense Auth. Provider   molnupiravir EUA (LAGEVRIO) 200 mg CAPS  capsule Take 4 capsules (800 mg total) by mouth 2 (two) times daily for 5 days. 40 capsule Scot Jun, FNP   benzonatate (TESSALON) 100 MG capsule Take 1-2 capsules (100-200 mg total) by mouth 3 (three) times daily as needed for cough. 40 capsule Scot Jun, FNP      PDMP not reviewed this  encounter.   Scot Jun, FNP 02/10/21 2044

## 2021-02-10 NOTE — Discharge Instructions (Addendum)
If you develop any shortness of breath or chest pain- go immediately to the emergency department.

## 2021-02-10 NOTE — ED Triage Notes (Signed)
Patient took a at home covid test today and it was positive.  Patient has a scratchy throat, dry cough, sinus drainage and runny nose, chills, and fever 100.2, no appetite.  Patient thinks she was exposed on Monday night.  Monday night, patient met with choir.  Later was notified someone in choir had covid.  Patient started feeling bad with scratchy throat last night, 02/09/2021 - 02/10/2021

## 2021-02-13 ENCOUNTER — Other Ambulatory Visit: Payer: Self-pay | Admitting: Internal Medicine

## 2021-02-17 DIAGNOSIS — U071 COVID-19: Secondary | ICD-10-CM | POA: Diagnosis not present

## 2021-02-25 DIAGNOSIS — Z20822 Contact with and (suspected) exposure to covid-19: Secondary | ICD-10-CM | POA: Diagnosis not present

## 2021-03-09 DIAGNOSIS — E1042 Type 1 diabetes mellitus with diabetic polyneuropathy: Secondary | ICD-10-CM | POA: Diagnosis not present

## 2021-03-09 DIAGNOSIS — E1022 Type 1 diabetes mellitus with diabetic chronic kidney disease: Secondary | ICD-10-CM | POA: Diagnosis not present

## 2021-03-09 DIAGNOSIS — Z794 Long term (current) use of insulin: Secondary | ICD-10-CM | POA: Diagnosis not present

## 2021-03-09 DIAGNOSIS — Z7189 Other specified counseling: Secondary | ICD-10-CM | POA: Diagnosis not present

## 2021-03-09 DIAGNOSIS — I10 Essential (primary) hypertension: Secondary | ICD-10-CM | POA: Diagnosis not present

## 2021-03-09 DIAGNOSIS — E11319 Type 2 diabetes mellitus with unspecified diabetic retinopathy without macular edema: Secondary | ICD-10-CM | POA: Diagnosis not present

## 2021-03-09 DIAGNOSIS — N1832 Chronic kidney disease, stage 3b: Secondary | ICD-10-CM | POA: Diagnosis not present

## 2021-03-09 DIAGNOSIS — E1165 Type 2 diabetes mellitus with hyperglycemia: Secondary | ICD-10-CM | POA: Diagnosis not present

## 2021-03-09 DIAGNOSIS — E039 Hypothyroidism, unspecified: Secondary | ICD-10-CM | POA: Diagnosis not present

## 2021-04-04 NOTE — Progress Notes (Incomplete)
Cardiology Office Note   Date:  04/04/2021   ID:  Courtney Pearson, DOB Mar 02, 1950, MRN [id number]  PCP:  Courtney Jordan, MD  Cardiologist:  Courtney Light Oval Linsey, MD, [facility]  Electrophysiologist: Dr. Rayann Pearson Nephrologist: Dr. Joelyn Pearson  No chief complaint on file.   History of Present Illness: Courtney Pearson is a 71 y.o. female with persistent atrial fibrillation/flutter s/p ablation 12/2017 and DCCV, coronary calcification, hypertension, hyperlipidemia, and diabetes here for follow up.  Courtney Pearson saw her endocrinologist 03/2017 and was noted to be in atrial flutter with ventricular rates in the 120s-140s.  Propranolol was switched to metoprolol and she was started on Eliquis.  She subsequently followed up in atrial fibrillation clinic and had an echo 04/12/2017 that revealed LVEF 55 to 60% with mildly elevated pulmonary pressures.  She was noted to have a small pericardial effusion but no evidence of tamponade.  She underwent cardioversion 04/2017 but had recurrent atrial for relation.  She wore an event monitor 05/2017 that showed she was in atrial fibrillation 50% of the time.  She also had a 3.3-second postconversion pause.  She was subsequently started on flecainide.  She has also been on amiodarone and failed multiple cardioversion.  She underwent atrial fibrillation ablation 12/2017.  She had recurrent atrial fibrillation and DCCV that was unsuccessful.  She had a consultation with Dr. Orvan Seen and is considering surgical ablation.  Prior to her ablation she had a preablation CT that had a calcium score 1712.  She had a [employer] 03/2019 that was negative for ischemia.  She feels her heart skipping around.  She gets short of breath very easily.  She notices it even walking form one side of the house to the other.  She had a consultation with Dr. Orvan Seen regarding MAZE.  She is concerned that the success rate may not be high and she doesn't want to go through it if it won't work.   Ms.  Pearson was referred back to atrial fibrillation clinic and was advised that Tikosyn was an option.  She continues to have frequent atrial fibrillation. She was volume overloaded and advised to take extra Lasix for 3 days. She called our office 05/2020 and her heart rate was in the 30s. She was advised to stop taking her metoprolol.  She brought strips from her [employer] mobile device that showed that she was in sinus bradycardia in the 30s.  It was one of the episodes with a postconversion pause but she remained in sinus bradycardia.   Her heart rates remained poorly controlled in Afib. She was considering the MAZE procedure She was also having bradycardia at home. She wore a 14-day Zio monitor that showed an average heart rate of 92 bpm with a minimum of 47 bpm and a 3.2 second pause.   At her last visit, she complained of weakness and continued to be in atrial flutter. Rate control was difficult to to sick sinus syndrome. Her first ablation was ineffective and she was considering the MAZE procedure or repeat ablation. She was referred to the PREP program. She saw Dr. Rayann Pearson 12/2020 for repeat ablation. She presented to the ED 01/2021 for antiviral therapy after a positive COVID home test. She was given molnupiravir and Tessalon Perles.   Today, she states she is feeling about the same overall. At home her heart rate varies "quite a bit" but averages in the 60s, and may be as high as the 120s. She is still considering the MAZE procedure. Generally,  her legs feel weak, and she is unable to step up without steadying herself on something. She has difficulty climbing stairs and walking for long periods. Therefore her formal exercise is severely limited. She purchased a stationary bicycle but has not used this. Her LE edema is less severe in the mornings. She has been wearing light compression socks. Typically she takes her first dose of lasix around noon. When she first began taking a statin, this was mostly  preventative. Over a year ago she completed the PREP program. She denies any palpitations, chest pain, or shortness of breath. No lightheadedness, headaches, syncope, orthopnea, or PND.  Today, ***  She denies any palpitations, chest pain, or shortness of breath, lightheadedness, headaches, syncope, orthopnea, PND, lower extremity edema or exertional symptoms.  Past Medical History:  Diagnosis Date   Acute on chronic diastolic heart failure ([place]) 03/12/2020   Atypical atrial flutter ([place]) 03/12/2020   CAD in native artery 03/20/2019   Diabetes ([place])    HTN (hypertension)    Hyperlipidemia    Hypothyroidism    Palpitation    Persistent atrial fibrillation ([place]) 04/03/2017   Renal insufficiency    Sick sinus syndrome ([place]) 07/06/2020   Weakness 10/06/2020    Past Surgical History:  Procedure Laterality Date   ATRIAL FIBRILLATION ABLATION N/A 01/01/2018   Procedure: ATRIAL FIBRILLATION ABLATION;  Surgeon: Thompson Grayer, MD;  Location: [place] [facility];  Service: Cardiovascular;  Laterality: N/A;   ATRIAL FIBRILLATION ABLATION N/A 10/31/2018   Procedure: ATRIAL FIBRILLATION ABLATION;  Surgeon: Thompson Grayer, MD;  Location: [place] [facility];  Service: Cardiovascular;  Laterality: N/A;   ATRIAL FIBRILLATION ABLATION N/A 01/06/2021   Procedure: ATRIAL FIBRILLATION ABLATION;  Surgeon: Thompson Grayer, MD;  Location: [place] [facility];  Service: Cardiovascular;  Laterality: N/A;   BREAST BIOPSY     CARDIOVERSION N/A 04/30/2017   Procedure: CARDIOVERSION;  Surgeon: [name], MD;  Location: [place];  Service: Cardiovascular;  Laterality: N/A;   CARDIOVERSION N/A 02/15/2018   Procedure: CARDIOVERSION;  Surgeon: [name], MD;  Location: [place];  Service: Cardiovascular;  Laterality: N/A;   CARDIOVERSION N/A 04/10/2018   Procedure: CARDIOVERSION;  Surgeon: Josue Hector, MD;  Location: [facility];  Service: Cardiovascular;  Laterality: N/A;   CESAREAN SECTION     EYE  SURGERY       Current Outpatient Medications  Medication Sig Dispense Refill   acetaminophen (TYLENOL) 500 MG tablet Take 500 mg by mouth every 8 (eight) hours as needed for moderate pain or headache.     amoxicillin (AMOXIL) 500 MG capsule Take 4 capsules by mouth See admin instructions. 1 hour prior to dental appointments (Patient not taking: Reported on 02/10/2021)     apixaban (ELIQUIS) 5 MG TABS tablet Take 1 tablet (5 mg total) by mouth 2 (two) times daily. 28 tablet 0   atorvastatin (LIPITOR) 10 MG tablet Take 10 mg by mouth every other day.      benzonatate (TESSALON) 100 MG capsule Take 1-2 capsules (100-200 mg total) by mouth 3 (three) times daily as needed for cough. 40 capsule 0   bismuth subsalicylate (PEPTO BISMOL) 262 MG/15ML suspension Take 30 mLs by mouth daily as needed for indigestion.     calcium carbonate (TUMS EX) 750 MG chewable tablet Chew 750 mg by mouth daily.      Cholecalciferol (VITAMIN D-3) 25 MCG (1000 UT) CAPS Take 1,000 Units by mouth daily.     fexofenadine (ALLEGRA) 180 MG tablet Take 180  mg by mouth as needed for allergies or rhinitis.      furosemide (LASIX) 40 MG tablet Take 40-80 mg by mouth See admin instructions. 80 mg on Monday, Wednesday, and Friday only Take 40 mg all other days     hydrocortisone cream 1 % Apply 1 application topically daily as needed for itching.     insulin aspart (NOVOLOG) 100 UNIT/ML injection Inject 25 Units into the skin daily. Via insulin pump     levothyroxine (SYNTHROID) 88 MCG tablet Take 88 mcg by mouth daily before breakfast.     loperamide (IMODIUM A-D) 2 MG tablet Take 2-4 mg by mouth as needed for diarrhea or loose stools.      Multiple Vitamins-Minerals (MULTIVITAMIN ADULT PO) Take 1 tablet by mouth once a week.     pantoprazole (PROTONIX) 40 MG tablet Take 1 tablet (40 mg total) by mouth daily. 45 tablet 0   No current facility-administered medications for this visit.    Allergies:   Patient has no known  allergies.    Social History:  The patient  reports that she has never smoked. She has never used smokeless tobacco. She reports that she does not drink alcohol and does not use drugs.   Family History:  The patient's family history includes Anemia in her father; Asthma in her sister; Heart disease in an other family member; Hypertension in her mother; Leukemia in her brother; Lupus in her child; OCD in her sister; Osteoarthritis in her mother and sister; Osteoporosis in her mother.    ROS:   Please see the history of present illness. All other systems are reviewed and negative.   PHYSICAL EXAM: VS:  There were no vitals taken for this visit. , BMI There is no height or weight on file to calculate BMI. GENERAL:  Well appearing HEENT: Pupils equal round and reactive, fundi not visualized, oral mucosa unremarkable NECK: No JVD.  waveform within normal limits, carotid upstroke brisk and symmetric, no bruits LUNGS:  Clear to auscultation bilaterally HEART: ***Tachycardic.  ***Irregularly irregular.  PMI not displaced or sustained,S1 and S2 within normal limits, no S3, no S4, no clicks, no rubs, no murmurs ABD:  Flat, positive bowel sounds normal in frequency in pitch, no bruits, no rebound, no guarding, no midline pulsatile mass, no hepatomegaly, no splenomegaly EXT:  2 plus pulses throughout, no edema.  no cyanosis no clubbing SKIN:  ***Stasis dermatitis of bilateral LE. No rashes no nodules NEURO:  Cranial nerves II through XII grossly intact, motor grossly intact throughout PSYCH:  Cognitively intact, oriented to person place and time  EKG:  EKG was not ordered today 10/06/2020: Atrial flutter. Rate 101 bpm. LAFB. 07/06/2020: Atrial Fibrillation. Rate 108 bpm. LAD. 03/12/20: Atrial flutter.  Rate 88 bpm.  LAFB.   06/27/19: atrial flutter.  Rate 70 bpm.  Afib Ablation 01/06/21 CONCLUSIONS: 1. Clockwise mitral annular reentrant atrial flutter upon presentation successfully ablated along the  mitral isthmus  2. Intracardiac echo reveals a large sized left atrium. 3.  All 4 PVs were quiescent from the prior ablation.  There was a trivial amount of electrical activity along the top of the left superior pulmonary vein.  Ablation was performed in this location. The patient did not require additional ablation of the pulmonary veins. 5. Additional mapping and ablation within the left atrium due to persistence of atrial fibrillation with a posterior wall box demonstrated  6. Multiple atypical atrial flutter circuits observed.  Successful cardioversion to sinus rhythm 7. No inducible  arrhythmias following ablation 8. No early apparent complications.  CT Cardiac Morph 12/30/20 IMPRESSION: 1. There is normal pulmonary vein drainage into the left atrium with ostial measurements above. Left common PV ostium. Measurement similar to prior study.   2. There is no thrombus in the left atrial appendage on delayed imaging.   3. The esophagus runs in proximity to the RUPV ostium   4. No PFO/ASD.   5. Normal coronary origin. Right dominance.   6. CAC score of 3153 which is percentile for age-, race-, and sex-matched controls.  Echo 08/04/2020:  1. Left ventricular ejection fraction, by estimation, is 55 to 60%. The  left ventricle has normal function. The left ventricle has no regional  wall motion abnormalities. There is mild concentric left ventricular  hypertrophy. Diastolic function is  indeterminant due to Afib.   2. Right ventricular systolic function is normal. The right ventricular  size is normal. There is normal pulmonary artery systolic pressure. The  estimated right ventricular systolic pressure is 02.7 mmHg.   3. Left atrial size was mildly dilated.   4. Right atrial size was mildly dilated.   5. A small pericardial effusion is present. The pericardial effusion is  posterior to the left ventricle.   6. The mitral valve is abnormal. There is moderate thickening of the   mitral valve leaflet(s). There is moderate calcification of the mitral  valve leaflet(s). Moderate mitral annular calcification. Mild mitral valve  regurgitation. Mild mitral stenosis.  MVA by continuity is 1.4cm2, mean gradient 53mHg at variable HR.   7. The aortic valve is tricuspid. There is mild thickening of the aortic  valve. Aortic valve regurgitation is not visualized. Mild aortic valve  sclerosis is present, with no evidence of aortic valve stenosis.   8. The inferior vena cava is normal in size with greater than 50%  respiratory variability, suggesting right atrial pressure of 3 mmHg.   Comparison(s): Compared to prior TTE in 12/2018, there is no significant  change.   Monitor 08/02/2020: 14 day Zio Monitor   Quality: Fair.  Baseline artifact. Predominant rhythm: atrial flutter Average heart rate: 92 bpm Max heart rate: 134 bpm Min heart rate: 47 bpm Pauses >2.5 seconds: One pause 3.2 sec   Rare PVCs  [place] 02/24/2020: Right: Resting right ankle-brachial index indicates noncompressible right  lower extremity arteries. The right toe-brachial index is normal.   Left: Resting left ankle-brachial index indicates noncompressible left  lower extremity arteries. The left toe-brachial index is normal.   Lexiscan Myoview 04/01/19: There was no ST segment deviation noted during stress. The study is normal. This is a low risk study with no evidence of ischemia. Systolic function is not assessed due to atrial fibrillation.  Echo 01/01/2019:  1. Left ventricular ejection fraction, by visual estimation, is 55 to  60%. The left ventricle has normal function. There is mildly increased  left ventricular hypertrophy.   2. Elevated left ventricular end-diastolic pressure.   3. Left ventricular diastolic parameters are indeterminate.   4. Global right ventricle has normal systolic function.The right  ventricular size is normal. No increase in right ventricular wall  thickness.   5.  Left atrial size was mildly dilated.   6. Right atrial size was mildly dilated.   7. Moderate mitral annular calcification.   8. The mitral valve is abnormal in structure. Trivial mitral valve  regurgitation. Mitral valve mean diastolic gradient is 5 mmHg, heart rate  variable.   9. The tricuspid valve is normal  in structure. Tricuspid valve  regurgitation mild-moderate.  10. The aortic valve is tricuspid. Aortic valve regurgitation is not  visualized. No evidence of aortic valve sclerosis or stenosis.  11. The pulmonic valve was normal in structure. Pulmonic valve  regurgitation is trivial.  12. Mildly elevated pulmonary artery systolic pressure.  13. The inferior vena cava is normal in size with <50% respiratory  variability, suggesting right atrial pressure of 8 mmHg.  14. Small pericardial effusion.  15. The pericardial effusion is posterior to the left ventricle.  Pre-ablation cardiac CT 10/25/18: IMPRESSION: 1. Single left pulmonary vein with measurements as above. Ostial diameter measurements noted to be similar to CTA dated 12/25/2017, however area measurements noted to be smaller on this study for RUPV and left common trunk. 2. There is no thrombus in the left atrial appendage. 3. The esophagus runs in close proximity to the RUPV ostium. 4. Severe biatrial enlargement. 5. 3 vessel calcified atherosclerosis. Coronary calcium score of 1712.  14 Day Event Monitor 06/07/17:   Quality: Fair.  Baseline artifact. Predominant rhythm: atrial fibrillation 58%.  Rates up to 110 bpm. Average heart rate: 77 bpm 3.3 second post conversion pause noted. PACs and PVCs, atrial bigeminy noted   Echo 04/12/17: Study Conclusions   - Left ventricle: The cavity size was normal. Wall thickness was   normal. Systolic function was normal. The estimated ejection   fraction was in the range of 55% to 60%. Wall motion was normal;   there were no regional wall motion abnormalities. The study is    not technically sufficient to allow evaluation of LV diastolic   function. - Aortic valve: Trileaflet; mildly thickened, mildly calcified   leaflets. - Mitral valve: Calcified annulus. Mildly thickened leaflets . - Pulmonary arteries: Systolic pressure was mildly to moderately   increased. PA peak pressure: 46 mm Hg (S). - Pericardium, extracardiac: A small pericardial effusion was   identified circumferential to the heart, mostly along the   posterior and right ventricular free wall. There was no evidence   of hemodynamic compromise.   Recent Labs: 05/07/2020: ALT 16 12/13/2020: BUN 31; Creatinine, Ser 1.40; Hemoglobin 11.4; Platelets 216; Potassium 4.1; Sodium 136   12/11/16: Sodium 140, potassium 4.4, BUN 26, creatinine 1.19 AST 22, ALT 18 Hemoglobin A1c 8.2% Total cholesterol 143, triglycerides 54, HDL 62, LDL 71   Lipid Panel No results found for: CHOL, TRIG, HDL, CHOLHDL, VLDL, LDLCALC, LDLDIRECT    Wt Readings from Last 3 Encounters:  02/08/21 198 lb (89.8 kg)  01/06/21 200 lb (90.7 kg)  11/15/20 198 lb (89.8 kg)      ASSESSMENT AND PLAN: No problem-specific Assessment & Plan notes found for this encounter.     Current medicines are reviewed at length with the patient today.  The patient does not have concerns regarding medicines.  The following changes have been made: none  Labs/ tests ordered today include:   No orders of the defined types were placed in this encounter.    Disposition:   FU with Tiffany C. Oval Linsey, MD, [facility] in ***  Olivia Lopez de Gutierrez as a scribe for [name], MD.,have documented all relevant documentation on the behalf of [name], MD,as directed by  [name], MD while in the presence of [name], MD.  ***  Signed, Tiffany C. Oval Linsey, MD, [facility]  04/04/2021 1:36 PM    [place]

## 2021-04-05 ENCOUNTER — Ambulatory Visit (INDEPENDENT_AMBULATORY_CARE_PROVIDER_SITE_OTHER): Payer: Medicare Other | Admitting: Cardiovascular Disease

## 2021-04-05 ENCOUNTER — Encounter (HOSPITAL_BASED_OUTPATIENT_CLINIC_OR_DEPARTMENT_OTHER): Payer: Self-pay | Admitting: Cardiovascular Disease

## 2021-04-05 ENCOUNTER — Other Ambulatory Visit: Payer: Self-pay

## 2021-04-05 DIAGNOSIS — E781 Pure hyperglyceridemia: Secondary | ICD-10-CM | POA: Diagnosis not present

## 2021-04-05 DIAGNOSIS — I2510 Atherosclerotic heart disease of native coronary artery without angina pectoris: Secondary | ICD-10-CM | POA: Diagnosis not present

## 2021-04-05 DIAGNOSIS — I484 Atypical atrial flutter: Secondary | ICD-10-CM | POA: Diagnosis not present

## 2021-04-05 DIAGNOSIS — I10 Essential (primary) hypertension: Secondary | ICD-10-CM | POA: Diagnosis not present

## 2021-04-05 DIAGNOSIS — I5032 Chronic diastolic (congestive) heart failure: Secondary | ICD-10-CM

## 2021-04-05 MED ORDER — LOSARTAN POTASSIUM 50 MG PO TABS: 50.0000 mg | ORAL_TABLET | Freq: Every day | ORAL | 3 refills | Status: DC

## 2021-04-05 NOTE — Assessment & Plan Note (Signed)
She is doing great after ablation.  She continues to maintain sinus rhythm.  Her beta-blocker was held due to bradycardia.  Continue Eliquis. ?

## 2021-04-05 NOTE — Patient Instructions (Addendum)
Medication Instructions:  ?START LOSARTAN 50 MG DAILY  ? ?*If you need a refill on your cardiac medications before your next appointment, please call your pharmacy* ? ?Lab Work: ?CALL THE OFFICE IF YOUR KIDNEY DOCTOR DOES NOT DO LABS WHEN YOU FOLLOW UP SOON  ? ?Testing/Procedures: ?Your physician has requested that you have a lexiscan myoview. For further information please visit [email or web address]. Please follow instruction sheet, as given. ? ?Follow-Up: ?At [facility], you and your health needs are our priority.  As part of our continuing mission to provide you with exceptional heart care, we have created designated Provider Care Teams.  These Care Teams include your primary Cardiologist (physician) and Advanced Practice Providers (APPs -  Physician Assistants and Nurse Practitioners) who all work together to provide you with the care you need, when you need it. ? ?We recommend signing up for the patient portal called "MyChart".  Sign up information is provided on this After Visit Summary.  MyChart is used to connect with patients for Virtual Visits (Telemedicine).  Patients are able to view lab/test results, encounter notes, upcoming appointments, etc.  Non-urgent messages can be sent to your provider as well.   ?To learn more about what you can do with MyChart, go to [email or web address].   ? ?Your next appointment:   ?6 month(s) ? ?The format for your next appointment:   ?In Person ? ?Provider:   ?[name], MD{ ? ?[place]  ?

## 2021-04-05 NOTE — Assessment & Plan Note (Signed)
She reports atypical chest pain.  She recently had a calcium score during her pulmonary vein study that showed that she was 99 percentile.  Overall, I do not think that her symptoms are ischemic.  However given her calcium score over 3000, we will get a Lexiscan Myoview to assess for ischemia.  She is unable to walk on a treadmill. ? ?Shared Decision Making/Informed Consent ? ?The risks [chest pain, shortness of breath, cardiac arrhythmias, dizziness, blood pressure fluctuations, myocardial infarction, stroke/transient ischemic attack, nausea, vomiting, allergic reaction, radiation exposure, metallic taste sensation and life-threatening complications (estimated to be 1 in 10,000)], benefits (risk stratification, diagnosing coronary artery disease, treatment guidance) and alternatives of a nuclear stress test were discussed in detail with Ms. Eslick and she agrees to proceed. ? ? ? ? ?

## 2021-04-05 NOTE — Assessment & Plan Note (Signed)
Blood pressure has been in the 130-160/60-70s at home since stopping metoprolol.  She previously tolerated losartan.  Resume '50mg'$  daily.  She states that we will not check any at this time.  She will keep checking her blood pressures at home and bring to follow-up. ?

## 2021-04-05 NOTE — Assessment & Plan Note (Signed)
She continues to have very mild lower extremity edema.  Weights have been stable.  Blood pressure is uncontrolled.  Adding losartan as above.  Continue furosemide. ?

## 2021-04-05 NOTE — Assessment & Plan Note (Signed)
Lipids are well-controlled.  Continue atorvastatin.  

## 2021-04-07 ENCOUNTER — Telehealth (HOSPITAL_COMMUNITY): Payer: Self-pay

## 2021-04-07 NOTE — Telephone Encounter (Signed)
Spoke with the patient, detailed instructions given. She stated that she would be here for her test. Asked to call back with any questions. Courtney Pearson EMTP 

## 2021-04-12 ENCOUNTER — Ambulatory Visit (HOSPITAL_COMMUNITY): Payer: Medicare Other | Attending: Cardiovascular Disease

## 2021-04-12 ENCOUNTER — Other Ambulatory Visit: Payer: Self-pay

## 2021-04-12 DIAGNOSIS — E781 Pure hyperglyceridemia: Secondary | ICD-10-CM | POA: Insufficient documentation

## 2021-04-12 DIAGNOSIS — I10 Essential (primary) hypertension: Secondary | ICD-10-CM | POA: Diagnosis not present

## 2021-04-12 DIAGNOSIS — I5032 Chronic diastolic (congestive) heart failure: Secondary | ICD-10-CM | POA: Diagnosis not present

## 2021-04-12 DIAGNOSIS — I484 Atypical atrial flutter: Secondary | ICD-10-CM | POA: Diagnosis not present

## 2021-04-12 DIAGNOSIS — I2510 Atherosclerotic heart disease of native coronary artery without angina pectoris: Secondary | ICD-10-CM | POA: Diagnosis not present

## 2021-04-12 LAB — MYOCARDIAL PERFUSION IMAGING
Base ST Depression (mm): 0 mm
LV dias vol: 70 mL (ref 46–106)
LV sys vol: 13 mL
Nuc Stress EF: 82 %
Peak HR: 77 {beats}/min
Rest HR: 61 {beats}/min
Rest Nuclear Isotope Dose: 10.6 mCi
SDS: 3
SRS: 2
SSS: 5
ST Depression (mm): 0 mm
Stress Nuclear Isotope Dose: 30.4 mCi
TID: 0.81

## 2021-04-12 MED ORDER — REGADENOSON 0.4 MG/5ML IV SOLN
0.4000 mg | Freq: Once | INTRAVENOUS | Status: AC
  Administered 2021-04-12: 0.4 mg via INTRAVENOUS

## 2021-04-12 MED ORDER — TECHNETIUM TC 99M TETROFOSMIN IV KIT
30.4000 | PACK | Freq: Once | INTRAVENOUS | Status: AC | PRN
  Administered 2021-04-12: 30.4 via INTRAVENOUS
  Filled 2021-04-12: qty 31

## 2021-04-12 MED ORDER — TECHNETIUM TC 99M TETROFOSMIN IV KIT
10.6000 | PACK | Freq: Once | INTRAVENOUS | Status: AC | PRN
  Administered 2021-04-12: 10.6 via INTRAVENOUS
  Filled 2021-04-12: qty 11

## 2021-04-14 ENCOUNTER — Encounter: Payer: Self-pay | Admitting: Internal Medicine

## 2021-04-14 ENCOUNTER — Other Ambulatory Visit: Payer: Self-pay

## 2021-04-14 ENCOUNTER — Ambulatory Visit (INDEPENDENT_AMBULATORY_CARE_PROVIDER_SITE_OTHER): Payer: Medicare Other | Admitting: Internal Medicine

## 2021-04-14 VITALS — BP 132/60 | HR 60 | Ht 67.0 in | Wt 193.6 lb

## 2021-04-14 DIAGNOSIS — I10 Essential (primary) hypertension: Secondary | ICD-10-CM | POA: Diagnosis not present

## 2021-04-14 DIAGNOSIS — I2510 Atherosclerotic heart disease of native coronary artery without angina pectoris: Secondary | ICD-10-CM | POA: Diagnosis not present

## 2021-04-14 DIAGNOSIS — I4819 Other persistent atrial fibrillation: Secondary | ICD-10-CM | POA: Diagnosis not present

## 2021-04-14 DIAGNOSIS — E1029 Type 1 diabetes mellitus with other diabetic kidney complication: Secondary | ICD-10-CM | POA: Diagnosis not present

## 2021-04-14 DIAGNOSIS — N1832 Chronic kidney disease, stage 3b: Secondary | ICD-10-CM | POA: Diagnosis not present

## 2021-04-14 DIAGNOSIS — Z20822 Contact with and (suspected) exposure to covid-19: Secondary | ICD-10-CM | POA: Diagnosis not present

## 2021-04-14 DIAGNOSIS — R809 Proteinuria, unspecified: Secondary | ICD-10-CM | POA: Diagnosis not present

## 2021-04-14 NOTE — Progress Notes (Signed)
? ?PCP: Jonathon Jordan, MD ?Primary Cardiologist: Dr Oval Linsey ? ?Courtney Pearson is a 71 y.o. female who presents today for routine electrophysiology followup.  Since his recent afib ablation, the patient reports doing very well.  she denies procedure related complications and is pleased with the results of the procedure.  Today, she denies symptoms of palpitations, chest pain, shortness of breath,  lower extremity edema, dizziness, presyncope, or syncope.  The patient is otherwise without complaint today.  ? ?Past Medical History:  ?Diagnosis Date  ? Acute on chronic diastolic heart failure ([place]) 03/12/2020  ? Atypical atrial flutter ([place]) 03/12/2020  ? CAD in native artery 03/20/2019  ? Chronic diastolic heart failure ([place]) 03/12/2020  ? Diabetes ([place])   ? HTN (hypertension)   ? Hyperlipidemia   ? Hypothyroidism   ? Palpitation   ? Persistent atrial fibrillation ([place]) 04/03/2017  ? Renal insufficiency   ? Sick sinus syndrome ([place]) 07/06/2020  ? Weakness 10/06/2020  ? ?Past Surgical History:  ?Procedure Laterality Date  ? ATRIAL FIBRILLATION ABLATION N/A 01/01/2018  ? Procedure: ATRIAL FIBRILLATION ABLATION;  Surgeon: Thompson Grayer, MD;  Location: [place];  Service: Cardiovascular;  Laterality: N/A;  ? ATRIAL FIBRILLATION ABLATION N/A 10/31/2018  ? Procedure: ATRIAL FIBRILLATION ABLATION;  Surgeon: Thompson Grayer, MD;  Location: [place] [facility];  Service: Cardiovascular;  Laterality: N/A;  ? ATRIAL FIBRILLATION ABLATION N/A 01/06/2021  ? Procedure: ATRIAL FIBRILLATION ABLATION;  Surgeon: Thompson Grayer, MD;  Location: [facility];  Service: Cardiovascular;  Laterality: N/A;  ? BREAST BIOPSY    ? CARDIOVERSION N/A 04/30/2017  ? Procedure: CARDIOVERSION;  Surgeon: [name], MD;  Location: [facility];  Service: Cardiovascular;  Laterality: N/A;  ? CARDIOVERSION N/A 02/15/2018  ? Procedure: CARDIOVERSION;  Surgeon: [name], MD;  Location: [place];  Service: Cardiovascular;   Laterality: N/A;  ? CARDIOVERSION N/A 04/10/2018  ? Procedure: CARDIOVERSION;  Surgeon: Josue Hector, MD;  Location: [facility];  Service: Cardiovascular;  Laterality: N/A;  ? CESAREAN SECTION    ? EYE SURGERY    ? ? ?ROS- all systems are personally reviewed and negatives except as per HPI above ? ?Current Outpatient Medications  ?Medication Sig Dispense Refill  ? acetaminophen (TYLENOL) 500 MG tablet Take 500 mg by mouth every 8 (eight) hours as needed for moderate pain or headache.    ? amoxicillin (AMOXIL) 500 MG capsule Take 4 capsules by mouth See admin instructions. 1 hour prior to dental appointments    ? apixaban (ELIQUIS) 5 MG TABS tablet Take 1 tablet (5 mg total) by mouth 2 (two) times daily. 28 tablet 0  ? atorvastatin (LIPITOR) 10 MG tablet Take 10 mg by mouth every other day.     ? bismuth subsalicylate (PEPTO BISMOL) 262 MG/15ML suspension Take 30 mLs by mouth daily as needed for indigestion.    ? calcium carbonate (TUMS EX) 750 MG chewable tablet Chew 750 mg by mouth daily.     ? Cholecalciferol (VITAMIN D-3) 25 MCG (1000 UT) CAPS Take 1,000 Units by mouth daily.    ? fexofenadine (ALLEGRA) 180 MG tablet Take 180 mg by mouth as needed for allergies or rhinitis.     ? furosemide (LASIX) 40 MG tablet Take 40-80 mg by mouth See admin instructions. 80 mg on Monday, Wednesday, and Friday only Take 40 mg all other days    ? hydrocortisone cream 1 % Apply 1 application topically daily as needed for itching.    ? insulin  aspart (NOVOLOG) 100 UNIT/ML injection Inject 25 Units into the skin daily. Via insulin pump    ? levothyroxine (SYNTHROID) 75 MCG tablet 1 tablet in the morning on an empty stomach    ? loperamide (IMODIUM A-D) 2 MG tablet Take 2-4 mg by mouth as needed for diarrhea or loose stools.     ? losartan (COZAAR) 50 MG tablet Take 1 tablet (50 mg total) by mouth daily. 90 tablet 3  ? Multiple Vitamins-Minerals (MULTIVITAMIN ADULT PO) Take 1 tablet by mouth once a week.    ? ?No current  facility-administered medications for this visit.  ? ? ?Physical Exam: ?Vitals:  ? 04/14/21 1240  ?BP: 132/60  ?Pulse: 60  ?SpO2: 98%  ?Weight: 193 lb 9.6 oz (87.8 kg)  ?Height: '5\' 7"'$  (1.702 m)  ? ? ?GEN- The patient is well appearing, alert and oriented x 3 today.   ?Head- normocephalic, atraumatic ?Eyes-  Sclera clear, conjunctiva pink ?Ears- hearing intact ?Oropharynx- clear ?Lungs- Clear to ausculation bilaterally, normal work of breathing ?Heart- Regular rate and rhythm, no murmurs, rubs or gallops, PMI not laterally displaced ?GI- soft, NT, ND, + BS ?Extremities- no clubbing, cyanosis, or edema ? ?EKG tracing ordered today is personally reviewed and shows sinus rhythm, PR 200 msec, RBBB ? ?Assessment and Plan: ? ?1. Persistent atrial fibrillation/ atypical atrial flutter ?Doing well s/p ablation ?chads2vasc score is 6.  Continue eliquis ? ?2. HTN ?Stable ?No change required today ? ?3. Chronic diastolic dysfunction ?Stable ?No change required today ?  ?Return to see me in 6 months ? ?Thompson Grayer MD, [facility] ?04/14/2021 ?12:47 PM ? ? ? ? ?

## 2021-04-14 NOTE — Patient Instructions (Addendum)
Medication Instructions:  ?Your physician recommends that you continue on your current medications as directed. Please refer to the Current Medication list given to you today. ?*If you need a refill on your cardiac medications before your next appointment, please call your pharmacy* ? ?Lab Work: ?None ordered. ?If you have labs (blood work) drawn today and your tests are completely normal, you will receive your results only by: ?MyChart Message (if you have MyChart) OR ?A paper copy in the mail ?If you have any lab test that is abnormal or we need to change your treatment, we will call you to review the results. ? ?Testing/Procedures: ?None ordered. ? ?Follow-Up: ?At [facility], you and your health needs are our priority.  As part of our continuing mission to provide you with exceptional heart care, we have created designated Provider Care Teams.  These Care Teams include your primary Cardiologist (physician) and Advanced Practice Providers (APPs -  Physician Assistants and Nurse Practitioners) who all work together to provide you with the care you need, when you need it. ? ?Your next appointment:   ?Your physician wants you to follow-up in: 6 months with Dr. Rayann Heman ?You will receive a reminder letter in the mail two months in advance. If you don't receive a letter, please call our office to schedule the follow-up appointment. ? ?

## 2021-04-20 ENCOUNTER — Other Ambulatory Visit (HOSPITAL_BASED_OUTPATIENT_CLINIC_OR_DEPARTMENT_OTHER): Payer: Self-pay | Admitting: Cardiovascular Disease

## 2021-04-20 NOTE — Telephone Encounter (Signed)
eliquis  

## 2021-04-20 NOTE — Telephone Encounter (Signed)
Prescription refill request for Eliquis received. ?Indication:Afib ?Last office visit:3/23 ?Scr:1.4 ?Age: 71 ?Weight:87.8 kg ? ?Prescription refilled ? ?

## 2021-05-05 ENCOUNTER — Telehealth (HOSPITAL_BASED_OUTPATIENT_CLINIC_OR_DEPARTMENT_OTHER): Payer: Self-pay

## 2021-05-05 NOTE — Telephone Encounter (Addendum)
Results called to patient who verbalizes understanding!  ? ? ?----- Message from [name], MD sent at 05/02/2021  9:23 AM EDT ----- ?Low risk stress test. ?

## 2021-05-16 DIAGNOSIS — Z20822 Contact with and (suspected) exposure to covid-19: Secondary | ICD-10-CM | POA: Diagnosis not present

## 2021-05-17 DIAGNOSIS — E039 Hypothyroidism, unspecified: Secondary | ICD-10-CM | POA: Diagnosis not present

## 2021-05-20 DIAGNOSIS — Z20822 Contact with and (suspected) exposure to covid-19: Secondary | ICD-10-CM | POA: Diagnosis not present

## 2021-05-25 DIAGNOSIS — R140 Abdominal distension (gaseous): Secondary | ICD-10-CM | POA: Diagnosis not present

## 2021-05-25 DIAGNOSIS — Z20822 Contact with and (suspected) exposure to covid-19: Secondary | ICD-10-CM | POA: Diagnosis not present

## 2021-05-25 DIAGNOSIS — I10 Essential (primary) hypertension: Secondary | ICD-10-CM | POA: Diagnosis not present

## 2021-06-02 ENCOUNTER — Other Ambulatory Visit: Payer: Self-pay | Admitting: Family Medicine

## 2021-06-02 DIAGNOSIS — E2839 Other primary ovarian failure: Secondary | ICD-10-CM

## 2021-06-06 DIAGNOSIS — I872 Venous insufficiency (chronic) (peripheral): Secondary | ICD-10-CM | POA: Diagnosis not present

## 2021-06-06 DIAGNOSIS — L821 Other seborrheic keratosis: Secondary | ICD-10-CM | POA: Diagnosis not present

## 2021-06-06 DIAGNOSIS — L853 Xerosis cutis: Secondary | ICD-10-CM | POA: Diagnosis not present

## 2021-06-06 DIAGNOSIS — D225 Melanocytic nevi of trunk: Secondary | ICD-10-CM | POA: Diagnosis not present

## 2021-06-06 DIAGNOSIS — L814 Other melanin hyperpigmentation: Secondary | ICD-10-CM | POA: Diagnosis not present

## 2021-06-07 ENCOUNTER — Ambulatory Visit (HOSPITAL_BASED_OUTPATIENT_CLINIC_OR_DEPARTMENT_OTHER): Payer: Medicare Other | Admitting: General Practice

## 2021-06-08 ENCOUNTER — Encounter (INDEPENDENT_AMBULATORY_CARE_PROVIDER_SITE_OTHER): Payer: Medicare Other | Admitting: Ophthalmology

## 2021-06-08 DIAGNOSIS — E103512 Type 1 diabetes mellitus with proliferative diabetic retinopathy with macular edema, left eye: Secondary | ICD-10-CM | POA: Diagnosis not present

## 2021-06-08 DIAGNOSIS — I10 Essential (primary) hypertension: Secondary | ICD-10-CM

## 2021-06-08 DIAGNOSIS — H43813 Vitreous degeneration, bilateral: Secondary | ICD-10-CM

## 2021-06-08 DIAGNOSIS — H35033 Hypertensive retinopathy, bilateral: Secondary | ICD-10-CM

## 2021-06-08 DIAGNOSIS — E103591 Type 1 diabetes mellitus with proliferative diabetic retinopathy without macular edema, right eye: Secondary | ICD-10-CM

## 2021-06-09 DIAGNOSIS — E039 Hypothyroidism, unspecified: Secondary | ICD-10-CM | POA: Diagnosis not present

## 2021-06-09 DIAGNOSIS — E11319 Type 2 diabetes mellitus with unspecified diabetic retinopathy without macular edema: Secondary | ICD-10-CM | POA: Diagnosis not present

## 2021-06-09 DIAGNOSIS — E1042 Type 1 diabetes mellitus with diabetic polyneuropathy: Secondary | ICD-10-CM | POA: Diagnosis not present

## 2021-06-09 DIAGNOSIS — E1022 Type 1 diabetes mellitus with diabetic chronic kidney disease: Secondary | ICD-10-CM | POA: Diagnosis not present

## 2021-06-09 DIAGNOSIS — N1832 Chronic kidney disease, stage 3b: Secondary | ICD-10-CM | POA: Diagnosis not present

## 2021-06-09 DIAGNOSIS — R2689 Other abnormalities of gait and mobility: Secondary | ICD-10-CM | POA: Diagnosis not present

## 2021-06-09 DIAGNOSIS — Z794 Long term (current) use of insulin: Secondary | ICD-10-CM | POA: Diagnosis not present

## 2021-06-09 DIAGNOSIS — I10 Essential (primary) hypertension: Secondary | ICD-10-CM | POA: Diagnosis not present

## 2021-06-14 ENCOUNTER — Encounter (HOSPITAL_BASED_OUTPATIENT_CLINIC_OR_DEPARTMENT_OTHER): Payer: Self-pay | Admitting: Family

## 2021-06-14 ENCOUNTER — Ambulatory Visit (INDEPENDENT_AMBULATORY_CARE_PROVIDER_SITE_OTHER): Payer: Medicare Other | Admitting: Family

## 2021-06-14 VITALS — BP 150/68 | HR 53 | Ht 67.0 in | Wt 194.4 lb

## 2021-06-14 DIAGNOSIS — I2510 Atherosclerotic heart disease of native coronary artery without angina pectoris: Secondary | ICD-10-CM

## 2021-06-14 DIAGNOSIS — I5032 Chronic diastolic (congestive) heart failure: Secondary | ICD-10-CM

## 2021-06-14 DIAGNOSIS — E785 Hyperlipidemia, unspecified: Secondary | ICD-10-CM | POA: Diagnosis not present

## 2021-06-14 DIAGNOSIS — I484 Atypical atrial flutter: Secondary | ICD-10-CM

## 2021-06-14 DIAGNOSIS — I10 Essential (primary) hypertension: Secondary | ICD-10-CM

## 2021-06-14 MED ORDER — LOSARTAN POTASSIUM 50 MG PO TABS: ORAL_TABLET | ORAL | 3 refills | Status: DC

## 2021-06-14 NOTE — Patient Instructions (Addendum)
Medication Instructions:  Your physician has recommended you make the following change in your medication:   Change: Losartan '25mg'$  (half tablet) at lunch and '50mg'$  (whole tablet) at bedtime   *If you need a refill on your cardiac medications before your next appointment, please call your pharmacy*   Lab Work: Your physician recommends that you return for lab work in 2 weeks at [facility] for a BMET. You do not need to be fasting.   If you have labs (blood work) drawn today and your tests are completely normal, you will receive your results only by: [place] (if you have MyChart) OR A paper copy in the mail If you have any lab test that is abnormal or we need to change your treatment, we will call you to review the results.   Testing/Procedures: None ordered today    Follow-Up: At [facility], you and your health needs are our priority.  As part of our continuing mission to provide you with exceptional heart care, we have created designated Provider Care Teams.  These Care Teams include your primary Cardiologist (physician) and Advanced Practice Providers (APPs -  Physician Assistants and Nurse Practitioners) who all work together to provide you with the care you need, when you need it.  We recommend signing up for the patient portal called "MyChart".  Sign up information is provided on this After Visit Summary.  MyChart is used to connect with patients for Virtual Visits (Telemedicine).  Patients are able to view lab/test results, encounter notes, upcoming appointments, etc.  Non-urgent messages can be sent to your provider as well.   To learn more about what you can do with MyChart, go to [email or web address].    Your next appointment:   2 month(s)  The format for your next appointment:   In Person  Provider:   [name], MD or [name], NP{  Other Instructions Heart Healthy Diet Recommendations: A low-salt diet is recommended. Meats should be grilled,  baked, or boiled. Avoid fried foods. Focus on lean protein sources like fish or chicken with vegetables and fruits. The American Heart Association is a Microbiologist!  American Heart Association Diet and Lifeystyle Recommendations   Exercise recommendations: The American Heart Association recommends 150 minutes of moderate intensity exercise weekly. Try 30 minutes of moderate intensity exercise 4-5 times per week. This could include walking, jogging, or swimming.   Important Information About Sugar

## 2021-06-14 NOTE — Progress Notes (Unsigned)
Office Visit    Patient Name: Courtney Pearson Date of Encounter: 06/14/2021  PCP:  Jonathon Jordan, [place]  Cardiologist:  [name], MD  Advanced Practice Provider:  No care team member to display Electrophysiologist:  None    Chief Complaint    Courtney Pearson is a 71 y.o. female with a hx of persistent atrial fibrillation/flutter s/p ablation 12/2017 and DCCV, CAD, hypertension, hyperlipidemia, diabetes presents today for hypertension follow up.    Past Medical History    Past Medical History:  Diagnosis Date   Acute on chronic diastolic heart failure ([place]) 03/12/2020   Atypical atrial flutter ([place]) 03/12/2020   CAD in native artery 03/20/2019   Chronic diastolic heart failure ([place]) 03/12/2020   Diabetes ([place])    HTN (hypertension)    Hyperlipidemia    Hypothyroidism    Palpitation    Persistent atrial fibrillation ([place]) 04/03/2017   Renal insufficiency    Sick sinus syndrome ([place]) 07/06/2020   Weakness 10/06/2020   Past Surgical History:  Procedure Laterality Date   ATRIAL FIBRILLATION ABLATION N/A 01/01/2018   Procedure: ATRIAL FIBRILLATION ABLATION;  Surgeon: Thompson Grayer, MD;  Location: [place] [facility];  Service: Cardiovascular;  Laterality: N/A;   ATRIAL FIBRILLATION ABLATION N/A 10/31/2018   Procedure: ATRIAL FIBRILLATION ABLATION;  Surgeon: Thompson Grayer, MD;  Location: [place] [facility];  Service: Cardiovascular;  Laterality: N/A;   ATRIAL FIBRILLATION ABLATION N/A 01/06/2021   Procedure: ATRIAL FIBRILLATION ABLATION;  Surgeon: Thompson Grayer, MD;  Location: [place] [facility];  Service: Cardiovascular;  Laterality: N/A;   BREAST BIOPSY     CARDIOVERSION N/A 04/30/2017   Procedure: CARDIOVERSION;  Surgeon: [name], MD;  Location: [place];  Service: Cardiovascular;  Laterality: N/A;   CARDIOVERSION N/A 02/15/2018   Procedure: CARDIOVERSION;  Surgeon: [name], MD;  Location: [place];   Service: Cardiovascular;  Laterality: N/A;   CARDIOVERSION N/A 04/10/2018   Procedure: CARDIOVERSION;  Surgeon: Josue Hector, MD;  Location: [facility];  Service: Cardiovascular;  Laterality: N/A;   CESAREAN SECTION     EYE SURGERY      Allergies  No Known Allergies  History of Present Illness    Courtney Pearson is a 71 y.o. female with a hx of persistent atrial fibrillation/flutter s/p ablation 12/2017 and DCCV, CAD, hypertension, hyperlipidemia, diabetes last seen by Dr. Rayann Heman 04/14/2021.  *** She is evaluated Dr. Nahser/14/23.  Due to elevated blood pressure losartan was resumed at 50 mg daily.  She had atypical chest pain no such Lexiscan Myoview was ordered. Lexiscan Myoview 04/12/2021 was low risk study with LVEF 82%.  She saw Dr. Rayann Heman 04/14/2021 and was doing well for cardioprotective and was recommended for EP follow-up in 6 months.  She presents today for follow-up. 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 Reports no shortness of breath nor dyspnea on exertion. Reports no chest pain, pressure, or tightness. No edema, orthopnea, PND. Reports no palpitations.  Reviewed lexiscan myoview and she was reassured by the result. NOtes her BP has been elevated at home. Saw Dr. Buddy Duty (endocrinology) last week and her BP was elevated but came down on recheck. Saw her PCP recently and BP was elevated. Her PCP recommended she split Losartan taking '25mg'$  twice per day. She has been taking '25mg'$  Losartan at 12pm and '25mg'$  Losartan at 12am. Notes occasional lightheadedness with 2-3 times per week. Home BP log with arm cuff: 131/56, 116/51, 127/50,  129/57, 134/50, 136/50, 140/53, 138/61, 136/50.  EKGs/Labs/Other Studies Reviewed:   The following studies were reviewed today:  Myoview3/21/23   The study is normal. The study is low risk.   No ST deviation was noted.   LV perfusion is normal.   Left ventricular function is normal. Nuclear stress EF: 82 %. The left ventricular ejection  fraction is hyperdynamic (>65%). End diastolic cavity size is normal.   Prior study available for comparison from 04/01/2019. No changes compared to prior study.   Low risk stress nuclear study with normal perfusion and normal left ventricular regional and global systolic function.  Afib Ablation 01/06/21 CONCLUSIONS: 1. Clockwise mitral annular reentrant atrial flutter upon presentation successfully ablated along the mitral isthmus  2. Intracardiac echo reveals a large sized left atrium. 3.  All 4 PVs were quiescent from the prior ablation.  There was a trivial amount of electrical activity along the top of the left superior pulmonary vein.  Ablation was performed in this location. The patient did not require additional ablation of the pulmonary veins. 5. Additional mapping and ablation within the left atrium due to persistence of atrial fibrillation with a posterior wall box demonstrated  6. Multiple atypical atrial flutter circuits observed.  Successful cardioversion to sinus rhythm 7. No inducible arrhythmias following ablation 8. No early apparent complications.   CT Cardiac Morph 12/30/20 IMPRESSION: 1. There is normal pulmonary vein drainage into the left atrium with ostial measurements above. Left common PV ostium. Measurement similar to prior study.   2. There is no thrombus in the left atrial appendage on delayed imaging.   3. The esophagus runs in proximity to the RUPV ostium   4. No PFO/ASD.   5. Normal coronary origin. Right dominance.   6. CAC score of 3153 which is 99th percentile for age-, race-, and sex-matched controls.   Echo 08/04/2020:  1. Left ventricular ejection fraction, by estimation, is 55 to 60%. The  left ventricle has normal function. The left ventricle has no regional  wall motion abnormalities. There is mild concentric left ventricular  hypertrophy. Diastolic function is  indeterminant due to Afib.   2. Right ventricular systolic function is normal.  The right ventricular  size is normal. There is normal pulmonary artery systolic pressure. The  estimated right ventricular systolic pressure is 88.4 mmHg.   3. Left atrial size was mildly dilated.   4. Right atrial size was mildly dilated.   5. A small pericardial effusion is present. The pericardial effusion is  posterior to the left ventricle.   6. The mitral valve is abnormal. There is moderate thickening of the  mitral valve leaflet(s). There is moderate calcification of the mitral  valve leaflet(s). Moderate mitral annular calcification. Mild mitral valve  regurgitation. Mild mitral stenosis.  MVA by continuity is 1.4cm2, mean gradient 70mHg at variable HR.   7. The aortic valve is tricuspid. There is mild thickening of the aortic  valve. Aortic valve regurgitation is not visualized. Mild aortic valve  sclerosis is present, with no evidence of aortic valve stenosis.   8. The inferior vena cava is normal in size with greater than 50%  respiratory variability, suggesting right atrial pressure of 3 mmHg.   Comparison(s): Compared to prior TTE in 12/2018, there is no significant  change.    Monitor 08/02/2020: 14 day Zio Monitor   Quality: Fair.  Baseline artifact. Predominant rhythm: atrial flutter Average heart rate: 92 bpm Max heart rate: 134 bpm Min heart rate: 47 bpm  Pauses >2.5 seconds: One pause 3.2 sec   Rare PVCs   [place] ABI 02/24/2020: Right: Resting right ankle-brachial index indicates noncompressible right  lower extremity arteries. The right toe-brachial index is normal.   Left: Resting left ankle-brachial index indicates noncompressible left  lower extremity arteries. The left toe-brachial index is normal.    Lexiscan Myoview 04/01/19: There was no ST segment deviation noted during stress. The study is normal. This is a low risk study with no evidence of ischemia. Systolic function is not assessed due to atrial fibrillation.   Echo 01/01/2019:  1. Left  ventricular ejection fraction, by visual estimation, is 55 to  60%. The left ventricle has normal function. There is mildly increased  left ventricular hypertrophy.   2. Elevated left ventricular end-diastolic pressure.   3. Left ventricular diastolic parameters are indeterminate.   4. Global right ventricle has normal systolic function.The right  ventricular size is normal. No increase in right ventricular wall  thickness.   5. Left atrial size was mildly dilated.   6. Right atrial size was mildly dilated.   7. Moderate mitral annular calcification.   8. The mitral valve is abnormal in structure. Trivial mitral valve  regurgitation. Mitral valve mean diastolic gradient is 5 mmHg, heart rate  variable.   9. The tricuspid valve is normal in structure. Tricuspid valve  regurgitation mild-moderate.  10. The aortic valve is tricuspid. Aortic valve regurgitation is not  visualized. No evidence of aortic valve sclerosis or stenosis.  11. The pulmonic valve was normal in structure. Pulmonic valve  regurgitation is trivial.  12. Mildly elevated pulmonary artery systolic pressure.  13. The inferior vena cava is normal in size with <50% respiratory  variability, suggesting right atrial pressure of 8 mmHg.  14. Small pericardial effusion.  15. The pericardial effusion is posterior to the left ventricle.   Pre-ablation cardiac CT 10/25/18: IMPRESSION: 1. Single left pulmonary vein with measurements as above. Ostial diameter measurements noted to be similar to CTA dated 12/25/2017, however area measurements noted to be smaller on this study for RUPV and left common trunk. 2. There is no thrombus in the left atrial appendage. 3. The esophagus runs in close proximity to the RUPV ostium. 4. Severe biatrial enlargement. 5. 3 vessel calcified atherosclerosis. Coronary calcium score of 1712.   14 Day Event Monitor 06/07/17:   Quality: Fair.  Baseline artifact. Predominant rhythm: atrial  fibrillation 58%.  Rates up to 110 bpm. Average heart rate: 77 bpm 3.3 second post conversion pause noted. PACs and PVCs, atrial bigeminy noted    Echo 04/12/17: Study Conclusions   - Left ventricle: The cavity size was normal. Wall thickness was   normal. Systolic function was normal. The estimated ejection   fraction was in the range of 55% to 60%. Wall motion was normal;   there were no regional wall motion abnormalities. The study is   not technically sufficient to allow evaluation of LV diastolic   function. - Aortic valve: Trileaflet; mildly thickened, mildly calcified   leaflets. - Mitral valve: Calcified annulus. Mildly thickened leaflets . - Pulmonary arteries: Systolic pressure was mildly to moderately   increased. PA peak pressure: 46 mm Hg (S). - Pericardium, extracardiac: A small pericardial effusion was   identified circumferential to the heart, mostly along the   posterior and right ventricular free wall. There was no evidence   of hemodynamic compromise.    EKG:  No EKG today.   Recent Labs: 12/13/2020: BUN 31; Creatinine, Ser  1.40; Hemoglobin 11.4; Platelets 216; Potassium 4.1; Sodium 136  Recent Lipid Panel No results found for: CHOL, TRIG, HDL, CHOLHDL, VLDL, LDLCALC, LDLDIRECT  Risk Assessment/Calculations:   CHA2DS2-VASc Score = 5  This indicates a 7.2% annual risk of stroke. The patient's score is based upon: CHF History: 1 HTN History: 1 Diabetes History: 0 Stroke History: 0 Vascular Disease History: 1 Age Score: 1 Gender Score: 1  Home Medications   Current Meds  Medication Sig   apixaban (ELIQUIS) 5 MG TABS tablet TAKE 1 TABLET TWICE DAILY   atorvastatin (LIPITOR) 10 MG tablet Take 10 mg by mouth every other day.    calcium carbonate (TUMS EX) 750 MG chewable tablet Chew 750 mg by mouth daily.    Cholecalciferol (VITAMIN D-3) 25 MCG (1000 UT) CAPS Take 1,000 Units by mouth daily.   furosemide (LASIX) 40 MG tablet Take 40-80 mg by mouth See  admin instructions. 80 mg on Monday, Wednesday, and Friday only Take 40 mg all other days   hydrocortisone cream 1 % Apply 1 application topically daily as needed for itching.   insulin aspart (NOVOLOG) 100 UNIT/ML injection Inject 25 Units into the skin daily. Via insulin pump   levothyroxine (SYNTHROID) 75 MCG tablet 1 tablet in the morning on an empty stomach   loperamide (IMODIUM A-D) 2 MG tablet Take 2-4 mg by mouth as needed for diarrhea or loose stools.    losartan (COZAAR) 50 MG tablet Take 1 tablet (50 mg total) by mouth daily.   Multiple Vitamins-Minerals (MULTIVITAMIN ADULT PO) Take 1 tablet by mouth once a week.     Review of Systems      All other systems reviewed and are otherwise negative except as noted above.  Physical Exam    VS:  BP (!) 150/68 (BP Location: Left Arm, Patient Position: Sitting, Cuff Size: Normal)   Pulse (!) 53   Ht '5\' 7"'$  (1.702 m)   Wt 194 lb 6.4 oz (88.2 kg)   SpO2 98%   BMI 30.45 kg/m  , BMI Body mass index is 30.45 kg/m.  Wt Readings from Last 3 Encounters:  06/14/21 194 lb 6.4 oz (88.2 kg)  04/14/21 193 lb 9.6 oz (87.8 kg)  04/12/21 192 lb (87.1 kg)    GEN: Well nourished, well developed, in no acute distress. HEENT: normal. Neck: Supple, no JVD, carotid bruits, or masses. Cardiac: RRR, no murmurs, rubs, or gallops. No clubbing, cyanosis, edema.  Radials/PT 2+ and equal bilaterally.  Respiratory:  Respirations regular and unlabored, clear to auscultation bilaterally. GI: Soft, nontender, nondistended. MS: No deformity or atrophy. Skin: Warm and dry, no rash. Neuro:  Strength and sensation are intact. Psych: Normal affect.  Assessment & Plan    HTN - BP not at goal of <130/80. Initial BP 142/56. Repeat manual BP 150/68. Presently taking Losartan '25mg'$  BID. Notes occasional lightheadedness with position changes - encouraged adequate hydration, continue compression socks, make position changes slowly. Change Losartan to '25mg'$  AM and '50mg'$   PM. BMP in 2 weeks. If BP not at goal at follow up, consider further uptitration vs transition to Valsartan vs addition of Amlodipine.  Atypical atrial flutter s/p ablation / Hypercoagulable state - Follows with Dr. Rayann Heman of EP. Continue Eliquis '5mg'$  BID, does not meet dose reduction criteria. CHA2DS2-VASc Score = 5 [CHF History: 1, HTN History: 1, Diabetes History: 0, Stroke History: 0, Vascular Disease History: 1, Age Score: 1, Gender Score: 1].  Therefore, the patient's annual risk of stroke is 7.2 %.  Chronic diastolic heart failure - Euvolemic and well compensated on exam. Continue current dose Lasix '80mg'$  and '40mg'$ . No beta blocker due to bradycardia. Low sodium diet, fluid restriction <2L, and daily weights encouraged. Educated to contact our office for weight gain of 2 lbs overnight or 5 lbs in one week.   CAD - Cardiac CT 12/2020 coronary calcium score 3153. Lexiscan 03/2021 low risk study. GDMT includes Atorvastatin. No aspirin due to chronic anticoagulation. No Metoprolol due to previous bradycardia. Heart healthy diet and regular cardiovascular exercise encouraged.    HLD, LDL goal <70 - 05/2021 LDL 65. Continue Atorvastatin '10mg'$  QD.   Hypothyroidism - Continue to follow with PCP.   Disposition: Follow up in 2 month(s) with [name], MD or APP.  Signed, Loel Dubonnet, NP 06/14/2021, 9:48 AM [place]

## 2021-06-16 ENCOUNTER — Encounter (HOSPITAL_BASED_OUTPATIENT_CLINIC_OR_DEPARTMENT_OTHER): Payer: Self-pay | Admitting: Family

## 2021-06-21 ENCOUNTER — Encounter: Payer: Self-pay | Admitting: Physical Therapy

## 2021-06-21 ENCOUNTER — Other Ambulatory Visit: Payer: Self-pay

## 2021-06-21 ENCOUNTER — Ambulatory Visit: Payer: Medicare Other | Attending: Internal Medicine | Admitting: Physical Therapy

## 2021-06-21 DIAGNOSIS — R2689 Other abnormalities of gait and mobility: Secondary | ICD-10-CM | POA: Insufficient documentation

## 2021-06-21 DIAGNOSIS — R2681 Unsteadiness on feet: Secondary | ICD-10-CM | POA: Diagnosis not present

## 2021-06-21 DIAGNOSIS — M6281 Muscle weakness (generalized): Secondary | ICD-10-CM | POA: Insufficient documentation

## 2021-06-21 NOTE — Therapy (Signed)
OUTPATIENT PHYSICAL THERAPY NEURO EVALUATION   Patient Name: Courtney Pearson MRN: [id number] DOB:1950-05-06, 71 y.o., female Today's Date: 06/22/2021   PCP: Jonathon Jordan, MD REFERRING PROVIDER: Delrae Rend, MD   PT End of Session - 06/21/21 1016     Visit Number 1    Number of Visits 16    Date for PT Re-Evaluation 08/17/21    PT Start Time 1017    PT Stop Time 1102    PT Time Calculation (min) 45 min    Activity Tolerance Patient tolerated treatment well    Behavior During Therapy [facility] for tasks assessed/performed             Past Medical History:  Diagnosis Date   Acute on chronic diastolic heart failure ([place]) 03/12/2020   Atypical atrial flutter ([place]) 03/12/2020   CAD in native artery 03/20/2019   Chronic diastolic heart failure ([place]) 03/12/2020   Diabetes ([place])    HTN (hypertension)    Hyperlipidemia    Hypothyroidism    Palpitation    Persistent atrial fibrillation ([place]) 04/03/2017   Renal insufficiency    Sick sinus syndrome ([place]) 07/06/2020   Weakness 10/06/2020   Past Surgical History:  Procedure Laterality Date   ATRIAL FIBRILLATION ABLATION N/A 01/01/2018   Procedure: ATRIAL FIBRILLATION ABLATION;  Surgeon: Thompson Grayer, MD;  Location: [place] [facility];  Service: Cardiovascular;  Laterality: N/A;   ATRIAL FIBRILLATION ABLATION N/A 10/31/2018   Procedure: ATRIAL FIBRILLATION ABLATION;  Surgeon: Thompson Grayer, MD;  Location: [place] [facility];  Service: Cardiovascular;  Laterality: N/A;   ATRIAL FIBRILLATION ABLATION N/A 01/06/2021   Procedure: ATRIAL FIBRILLATION ABLATION;  Surgeon: Thompson Grayer, MD;  Location: [place] [facility];  Service: Cardiovascular;  Laterality: N/A;   BREAST BIOPSY     CARDIOVERSION N/A 04/30/2017   Procedure: CARDIOVERSION;  Surgeon: [name], MD;  Location: [place];  Service: Cardiovascular;  Laterality: N/A;   CARDIOVERSION N/A 02/15/2018   Procedure: CARDIOVERSION;  Surgeon: [name], MD;  Location: [place];  Service: Cardiovascular;  Laterality: N/A;   CARDIOVERSION N/A 04/10/2018   Procedure: CARDIOVERSION;  Surgeon: Josue Hector, MD;  Location: [place];  Service: Cardiovascular;  Laterality: N/A;   [place]     Patient Active Problem List   Diagnosis Date Noted   Weakness 10/06/2020   Sick sinus syndrome ([place]) 07/06/2020   Chronic diastolic heart failure ([place]) 03/12/2020   Atypical atrial flutter ([employer]) 03/12/2020   CAD in native artery 03/20/2019   Persistent atrial fibrillation ([place])    Dyspnea 10/30/2012   Palpitations 10/30/2012   Essential hypertension 10/30/2012   Hyperlipidemia 10/30/2012    ONSET DATE: 06/09/2021 (order placed)  REFERRING DIAG: R26.89 (ICD-10-CM) - Other abnormalities of gait and mobility  THERAPY DIAG:  Other abnormalities of gait and mobility  Unsteadiness on feet  Muscle weakness (generalized)  Rationale for Evaluation and Treatment Rehabilitation  SUBJECTIVE:  SUBJECTIVE STATEMENT: Mentioned to my doctor that balance was getting worse.  Have neuropathy, and have had some balance issues for a while.  Feel like my leg strength is not very good.  Sometimes, I look like "I'm drunk" because I go to the side.  Have to use rail for steps.  Need assist for getting up choral risers and feel unsteady while standing.   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 No  PRECAUTIONS: Fall  WEIGHT BEARING  RESTRICTIONS No  FALLS: Has patient fallen in last 6 months? No  LIVING ENVIRONMENT: Lives with: lives with their spouse Lives in: House/apartment Stairs: Yes: Internal: 12 steps; on right going up and External: 3-4 steps; on right going up Has following equipment at home: Single point cane  PLOF: Independent  Sings in a chorus-rehearsals once/week  PATIENT GOALS Pt's goals for therapy are to get back to being more steady and be able to fully participate in chorus.  OBJECTIVE:    COGNITION: Overall cognitive status: Within functional limits for tasks assessed   SENSATION: Limited to touch, vibration sense (per pt report)   EDEMA:  Hx of edema in BLEs    POSTURE: No Significant postural limitations  LOWER EXTREMITY ROM:   WFL BLEs  Active  Right Eval Left Eval  Hip flexion    Hip extension    Hip abduction    Hip adduction    Hip internal rotation    Hip external rotation    Knee flexion    Knee extension    Ankle dorsiflexion    Ankle plantarflexion    Ankle inversion    Ankle eversion     (Blank rows = not tested)  LOWER EXTREMITY MMT:    MMT Right Eval Left Eval  Hip flexion 4/5 4/5  Hip extension    Hip abduction 4/5 4/5  Hip adduction 4/5 4/5  Hip internal rotation    Hip external rotation    Knee flexion 4/5 4/5  Knee extension 4+/5 4+/5  Ankle dorsiflexion 3+/5 3+/5  Ankle plantarflexion    Ankle inversion    Ankle eversion    (Blank rows = not tested)   TRANSFERS: Assistive device utilized: None  Sit to stand: Modified independence Stand to sit: Modified independence With UE support; able to perform 5x sit<>stand without UE support   STAIRS:SBA  Level of Assistance:   Stair Negotiation Technique: Alternating Pattern  with Bilateral Rails  Number of Stairs: 2-3   Height of Stairs: 4", 6"     GAIT: Gait pattern: step through pattern, decreased step length- Right, decreased step length- Left, lateral hip instability, and wide  BOS Distance walked: 60 Assistive device utilized: None Level of assistance: SBA and CGA Comments: Occasional veering to R and L  FUNCTIONAL TESTs:  5 times sit to stand: 18.09 sec Timed up and go (TUG): 13.91 sec Dynamic Gait Index: 13/24 Gait velocity:  19.22 sec (1.71 ft/sec)        M-CTSIB  Condition 1: Firm Surface, EO 30 Sec, Normal Sway  Condition 2: Firm Surface, EC 30 Sec, Mild Sway  Condition 3: Foam Surface, EO 30 Sec, Moderate Sway  Condition 4: Foam Surface, EC 21.66 Sec, Moderate Sway  Attempted tandem stance:  unable Attempted SLS:  unable either leg    PATIENT EDUCATION: Education details: PT eval results, POC Person educated: Patient Education method: Explanation Education comprehension: verbalized understanding   HOME EXERCISE PROGRAM: Sit<>stand from elevated surface, 5 reps, 3 sets     GOALS: Goals reviewed with patient? Yes  SHORT TERM GOALS: Target date: 07/20/2021  Pt will be independent with HEP for improved balance, strength, gait. Baseline: Goal status: INITIAL  2.  Pt will improve 5x sit<>stand to less than or equal to 15 sec to demonstrate improved functional strength and transfer efficiency. Baseline: 18.09 sec Goal status: INITIAL    LONG TERM GOALS: Target date: 08/17/2021  Pt will be independent with progression of HEP for improved strength, balance, transfers, and gait. Baseline:  Goal status: INITIAL  2.  Pt will improve 5x sit<>stand to less than or equal to 12.5 sec to demonstrate improved functional strength and transfer efficiency. Baseline:   Goal status: INITIAL  3.  Pt will improve DGI to at least 18/24  for decreased fall risk. Baseline: 13/24 Goal status: INITIAL  4.  Pt will improve gait velocity to at least 1.8 ft/sec for improved gait efficiency and safety. Baseline: 1.71 ft/sec Goal status: INITIAL  5.  Pt will verbalize understanding of fall prevention in home environment. Baseline:  Goal  status: INITIAL   ASSESSMENT:  CLINICAL IMPRESSION: Patient is a 71 y.o. female who was seen today for physical therapy evaluation and treatment for worsening balance and noticeable strength deficits.  She presents with multi-factorial balance deficits, including decreased hip and ankle strength, abnormal sensation due to neuropathy, decreased vestibular system use for balance.  She is at increased fall risk per 5x sit<>stand, DIG, TUG and gait velocity scores.  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    OBJECTIVE IMPAIRMENTS Abnormal gait, decreased balance, decreased mobility, difficulty walking, and decreased strength.   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   REHAB POTENTIAL: Good  CLINICAL DECISION MAKING: Evolving/moderate complexity  EVALUATION COMPLEXITY: Moderate  PLAN: PT FREQUENCY: 2x/week  PT DURATION: 8 weeks  PLANNED INTERVENTIONS: Therapeutic exercises, Therapeutic activity, Neuromuscular re-education, Balance training, Gait training, Patient/Family education, Stair training, and DME instructions  PLAN FOR NEXT SESSION: Review initial sit<>stand exercises; add to HEP to address hip stability, SLS, tandem stance, EO and EC on solid/foam surfaces.   Lindora Alviar W., PT 06/22/2021, 8:27 AM

## 2021-06-22 DIAGNOSIS — E103591 Type 1 diabetes mellitus with proliferative diabetic retinopathy without macular edema, right eye: Secondary | ICD-10-CM | POA: Diagnosis not present

## 2021-06-22 DIAGNOSIS — I4892 Unspecified atrial flutter: Secondary | ICD-10-CM | POA: Diagnosis not present

## 2021-06-22 DIAGNOSIS — I340 Nonrheumatic mitral (valve) insufficiency: Secondary | ICD-10-CM | POA: Diagnosis not present

## 2021-06-22 DIAGNOSIS — Z0000 Encounter for general adult medical examination without abnormal findings: Secondary | ICD-10-CM | POA: Diagnosis not present

## 2021-06-22 DIAGNOSIS — E039 Hypothyroidism, unspecified: Secondary | ICD-10-CM | POA: Diagnosis not present

## 2021-06-22 DIAGNOSIS — I8310 Varicose veins of unspecified lower extremity with inflammation: Secondary | ICD-10-CM | POA: Diagnosis not present

## 2021-06-22 DIAGNOSIS — N1832 Chronic kidney disease, stage 3b: Secondary | ICD-10-CM | POA: Diagnosis not present

## 2021-06-22 DIAGNOSIS — Z79899 Other long term (current) drug therapy: Secondary | ICD-10-CM | POA: Diagnosis not present

## 2021-06-22 DIAGNOSIS — I341 Nonrheumatic mitral (valve) prolapse: Secondary | ICD-10-CM | POA: Diagnosis not present

## 2021-06-22 DIAGNOSIS — R1310 Dysphagia, unspecified: Secondary | ICD-10-CM | POA: Diagnosis not present

## 2021-06-22 DIAGNOSIS — D692 Other nonthrombocytopenic purpura: Secondary | ICD-10-CM | POA: Diagnosis not present

## 2021-06-22 DIAGNOSIS — I700 Atherosclerosis of aorta: Secondary | ICD-10-CM | POA: Diagnosis not present

## 2021-06-22 NOTE — Addendum Note (Signed)
Addended by: Kerrie Pleasure on: 06/22/2021 09:53 AM   Modules accepted: Orders

## 2021-06-22 NOTE — Addendum Note (Signed)
Addended by: Rudell Cobb M on: 06/22/2021 01:36 PM   Modules accepted: Orders

## 2021-06-22 NOTE — Addendum Note (Signed)
Addended by: Kerrie Pleasure on: 06/22/2021 09:50 AM   Modules accepted: Orders

## 2021-06-23 ENCOUNTER — Ambulatory Visit: Payer: Medicare Other | Attending: Family Medicine | Admitting: Physical Therapy

## 2021-06-23 ENCOUNTER — Encounter: Payer: Self-pay | Admitting: Physical Therapy

## 2021-06-23 DIAGNOSIS — R2689 Other abnormalities of gait and mobility: Secondary | ICD-10-CM | POA: Insufficient documentation

## 2021-06-23 DIAGNOSIS — M6281 Muscle weakness (generalized): Secondary | ICD-10-CM | POA: Diagnosis not present

## 2021-06-23 DIAGNOSIS — R2681 Unsteadiness on feet: Secondary | ICD-10-CM | POA: Insufficient documentation

## 2021-06-23 NOTE — Therapy (Signed)
OUTPATIENT PHYSICAL THERAPY TREATMENT NOTE   Patient Name: Courtney Pearson MRN: [id number] DOB:04/09/1950, 71 y.o., female Today's Date: 06/23/2021  PCP: Jonathon Jordan, MD REFERRING PROVIDER: Delrae Rend, MD  END OF SESSION:   PT End of Session - 06/23/21 0937     Visit Number 2    Number of Visits 16    Date for PT Re-Evaluation 08/17/21    PT Start Time 0936    PT Stop Time 6073    PT Time Calculation (min) 38 min    Activity Tolerance Patient tolerated treatment well    Behavior During Therapy [facility] for tasks assessed/performed             Past Medical History:  Diagnosis Date   Acute on chronic diastolic heart failure ([place]) 03/12/2020   Atypical atrial flutter ([place]) 03/12/2020   CAD in native artery 03/20/2019   Chronic diastolic heart failure ([place]) 03/12/2020   Diabetes ([place])    HTN (hypertension)    Hyperlipidemia    Hypothyroidism    Palpitation    Persistent atrial fibrillation ([place]) 04/03/2017   Renal insufficiency    Sick sinus syndrome ([place]) 07/06/2020   Weakness 10/06/2020   Past Surgical History:  Procedure Laterality Date   ATRIAL FIBRILLATION ABLATION N/A 01/01/2018   Procedure: ATRIAL FIBRILLATION ABLATION;  Surgeon: Thompson Grayer, MD;  Location:  [facility];  Service: Cardiovascular;  Laterality: N/A;   ATRIAL FIBRILLATION ABLATION N/A 10/31/2018   Procedure: ATRIAL FIBRILLATION ABLATION;  Surgeon: Thompson Grayer, MD;  Location: [place] [facility];  Service: Cardiovascular;  Laterality: N/A;   ATRIAL FIBRILLATION ABLATION N/A 01/06/2021   Procedure: ATRIAL FIBRILLATION ABLATION;  Surgeon: Thompson Grayer, MD;  Location: [place] [facility];  Service: Cardiovascular;  Laterality: N/A;   BREAST BIOPSY     CARDIOVERSION N/A 04/30/2017   Procedure: CARDIOVERSION;  Surgeon: [name], MD;  Location: [place];  Service: Cardiovascular;  Laterality: N/A;   CARDIOVERSION N/A 02/15/2018   Procedure: CARDIOVERSION;  Surgeon: [name], MD;   Location: [place];  Service: Cardiovascular;  Laterality: N/A;   CARDIOVERSION N/A 04/10/2018   Procedure: CARDIOVERSION;  Surgeon: Josue Hector, MD;  Location: [facility];  Service: Cardiovascular;  Laterality: N/A;   [place]     Patient Active Problem List   Diagnosis Date Noted   Weakness 10/06/2020   Sick sinus syndrome ([place]) 07/06/2020   Chronic diastolic heart failure ([place]) 03/12/2020   Atypical atrial flutter ([place]) 03/12/2020   CAD in native artery 03/20/2019   Persistent atrial fibrillation ([place])    Dyspnea 10/30/2012   Palpitations 10/30/2012   Essential hypertension 10/30/2012   Hyperlipidemia 10/30/2012    REFERRING DIAG: R26.89 (ICD-10-CM) - Other abnormalities of gait and mobility  THERAPY DIAG:  Muscle weakness (generalized)  Unsteadiness on feet  Other abnormalities of gait and mobility  Rationale for Evaluation and Treatment Rehabilitation  PERTINENT HISTORY: 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   PAIN:  Are you having pain? No   OBJECTIVE:    TODAY'S TREATMENT: 06/23/2021 Activity Comments  Seated ex: Marching 2 x 10 LAQ 2 x 10 Ankle pumps 2 x 10   Sit<>stand, 5  reps from 20" surface and 5 reps from elevated mat surface, no UE support Review of HEP-pt return demo understanding.  Standing balance: Partial tandem stance, 2 x 30 seconds, light 1 UE support Single limb stance, 3 x 10 sec, light BUE support Heel/toe raises 2 x 10 reps Hip abduction x 10 reps same leg, then alternating leg x 10 reps Hip extension x 10 reps same leg, then alternating leg x 10  reps Marching in place 2 x 10 Cues for abdominal/glut activation and look ahead at target  Forward/back walking along counter, 3 reps, 1 UE support   Forward tandem walking along counter, 4 reps, 1 UE support   Short distance gait, cues for look ahead at visual target Veering to the right at times    PATIENT EDUCATION: Education details: HEP additions (see below) Person educated: Patient Education method: Explanation, Demonstration, and Handouts Education comprehension: verbalized understanding and returned demonstration   Access Code: [email or web address] URL: [email or web address] Date: 06/23/2021 Prepared by: [facility]  Exercises - Standing Romberg to 3/4 Tandem Stance  - 1 x daily - 5 x weekly - 1 sets - 3 reps - 30 sec hold - Single Leg Stance with Support  - 1 x daily - 5 x weekly - 1 sets - 3 reps - 10 sec hold - Standing Marching  - 1 x daily - 5 x weekly - 2 sets - 10 reps  GOALS: Goals reviewed with patient? Yes   SHORT TERM GOALS: Target date: 07/20/2021   Pt will be independent with HEP for improved balance, strength, gait. Baseline: Goal status: IN PROGRESS   2.  Pt will improve 5x sit<>stand to less than or equal to 15 sec to demonstrate improved functional strength and transfer efficiency. Baseline: 18.09 sec Goal status: IN PROGRESS       LONG TERM GOALS: Target date: 08/17/2021   Pt will be independent with progression of HEP for improved strength, balance, transfers, and gait. Baseline:  Goal status: IN PROGRESS   2.  Pt will improve 5x sit<>stand to less than or equal to 12.5 sec to demonstrate improved functional strength and transfer efficiency. Baseline:   Goal status: IN PROGRESS   3.  Pt will improve DGI to at least 18/24  for decreased fall risk. Baseline: 13/24 Goal status: IN PROGRESS   4.  Pt will improve gait velocity to at least 1.8 ft/sec for improved gait efficiency and safety. Baseline: 1.71  ft/sec Goal status: IN PROGRESS   5.  Pt will verbalize understanding of fall prevention in home environment. Baseline:  Goal status: IN PROGRESS     ASSESSMENT:   CLINICAL IMPRESSION: Skilled PT session focused on lower extremity strength and balance activities.  With standing balance activities at counter, she does require UE support for stability.  She demo understanding and good practice so far with sit<>stand.  With gait, pt tends to veer to R side, and pt does report that she has fallen arches more on the right than the left.  She will continue to benefit from skilled PT towards goals for improved mobility and decreased fall risk.    OBJECTIVE IMPAIRMENTS Abnormal gait, decreased balance, decreased mobility, difficulty walking, and decreased strength.    ACTIVITY LIMITATIONS standing, stairs, transfers, and locomotion level   PARTICIPATION LIMITATIONS: community activity and choral group (negotiating risers and standing to sing)   PERSONAL FACTORS 3+ comorbidities: Type 1 diabetes mellitus with proliferative diabetic retinopathy without macular  edema, Hypothyroidism, Balance problem, diabetic polyneuropathy, diabetic chronic kidney disease, Chronic kidney disease, stage 3b, Hypertension, A-fib, CHF are also affecting patient's functional outcome.    REHAB POTENTIAL: Good   CLINICAL DECISION MAKING: Evolving/moderate complexity   EVALUATION COMPLEXITY: Moderate   PLAN: PT FREQUENCY: 2x/week   PT DURATION: 8 weeks   PLANNED INTERVENTIONS: Therapeutic exercises, Therapeutic activity, Neuromuscular re-education, Balance training, Gait training, Patient/Family education, Stair training, and DME instructions   PLAN FOR NEXT SESSION: Review HEP; continue to work on hip stability, SLS, tandem stance, EO and EC on solid/foam surfaces.  Gait training with SPC.    Tyrrell Stephens W., PT 06/23/2021, 10:20 AM

## 2021-06-23 NOTE — Patient Instructions (Signed)
Access Code: [email or web address] URL: [email or web address] Date: 06/23/2021 Prepared by: [facility]  Exercises - Standing Romberg to 3/4 Tandem Stance  - 1 x daily - 5 x weekly - 1 sets - 3 reps - 30 sec hold - Single Leg Stance with Support  - 1 x daily - 5 x weekly - 1 sets - 3 reps - 10 sec hold - Standing Marching  - 1 x daily - 5 x weekly - 2 sets - 10 reps

## 2021-06-28 ENCOUNTER — Encounter: Payer: Self-pay | Admitting: Physical Therapy

## 2021-06-28 ENCOUNTER — Ambulatory Visit: Payer: Medicare Other | Admitting: Physical Therapy

## 2021-06-28 DIAGNOSIS — M6281 Muscle weakness (generalized): Secondary | ICD-10-CM

## 2021-06-28 DIAGNOSIS — R2681 Unsteadiness on feet: Secondary | ICD-10-CM | POA: Diagnosis not present

## 2021-06-28 DIAGNOSIS — R2689 Other abnormalities of gait and mobility: Secondary | ICD-10-CM

## 2021-06-28 DIAGNOSIS — E1042 Type 1 diabetes mellitus with diabetic polyneuropathy: Secondary | ICD-10-CM | POA: Diagnosis not present

## 2021-06-28 NOTE — Patient Instructions (Addendum)
Access Code: [email or web address] URL: [email or web address] Date: 06/28/2021 Prepared by: [facility]  Exercises - Standing Romberg to 3/4 Tandem Stance  - 1 x daily - 5 x weekly - 1 sets - 3 reps - 30 sec hold - Single Leg Stance with Support  - 1 x daily - 5 x weekly - 1 sets - 3 reps - 10 sec hold - Standing Marching  - 1 x daily - 5 x weekly - 2 sets - 10 reps  Added 06/28/2021: - Standing Balance in Corner  - 1 x daily - 5 x weekly - 1 sets - 5-10 reps - Corner Balance Feet Together With Eyes Open  - 1 x daily - 5 x weekly - 1 sets - 5-10 reps

## 2021-06-28 NOTE — Therapy (Signed)
OUTPATIENT PHYSICAL THERAPY TREATMENT NOTE   Patient Name: Courtney Pearson MRN: [id number] DOB:02-28-1950, 71 y.o., female Today's Date: 06/28/2021  PCP: Jonathon Jordan, MD REFERRING PROVIDER: Delrae Rend, MD  END OF SESSION:   PT End of Session - 06/28/21 1230     Visit Number 3    Number of Visits 16    Date for PT Re-Evaluation 08/17/21    Authorization Type Medicare    Progress Note Due on Visit 10    PT Start Time 1233    PT Stop Time 1314    PT Time Calculation (min) 41 min    Activity Tolerance Patient tolerated treatment well    Behavior During Therapy [facility] for tasks assessed/performed             Past Medical History:  Diagnosis Date   Acute on chronic diastolic heart failure ([place]) 03/12/2020   Atypical atrial flutter ([place]) 03/12/2020   CAD in native artery 03/20/2019   Chronic diastolic heart failure ([place]) 03/12/2020   Diabetes ([place])    HTN (hypertension)    Hyperlipidemia    Hypothyroidism    Palpitation    Persistent atrial fibrillation ([place]) 04/03/2017   Renal insufficiency    Sick sinus syndrome ([place]) 07/06/2020   Weakness 10/06/2020   Past Surgical History:  Procedure Laterality Date   ATRIAL FIBRILLATION ABLATION N/A 01/01/2018   Procedure: ATRIAL FIBRILLATION ABLATION;  Surgeon: Thompson Grayer, MD;  Location: [place] [facility];  Service: Cardiovascular;  Laterality: N/A;   ATRIAL FIBRILLATION ABLATION N/A 10/31/2018   Procedure: ATRIAL FIBRILLATION ABLATION;  Surgeon: Thompson Grayer, MD;  Location: [place] [facility];  Service: Cardiovascular;  Laterality: N/A;   ATRIAL FIBRILLATION ABLATION N/A 01/06/2021   Procedure: ATRIAL FIBRILLATION ABLATION;  Surgeon: Thompson Grayer, MD;  Location: [place] [facility];  Service: Cardiovascular;  Laterality: N/A;   BREAST BIOPSY     CARDIOVERSION N/A 04/30/2017   Procedure: CARDIOVERSION;  Surgeon: [name], MD;  Location: [place];  Service: Cardiovascular;  Laterality: N/A;   CARDIOVERSION N/A  02/15/2018   Procedure: CARDIOVERSION;  Surgeon: [name], MD;  Location: [place];  Service: Cardiovascular;  Laterality: N/A;   CARDIOVERSION N/A 04/10/2018   Procedure: CARDIOVERSION;  Surgeon: Josue Hector, MD;  Location: [facility];  Service: Cardiovascular;  Laterality: N/A;   [place]     Patient Active Problem List   Diagnosis Date Noted   Weakness 10/06/2020   Sick sinus syndrome ([place]) 07/06/2020   Chronic diastolic heart failure ([place]) 03/12/2020   Atypical atrial flutter ([place]) 03/12/2020   CAD in native artery 03/20/2019   Persistent atrial fibrillation ([place])    Dyspnea 10/30/2012   Palpitations 10/30/2012   Essential hypertension 10/30/2012   Hyperlipidemia 10/30/2012    REFERRING DIAG: R26.89 (ICD-10-CM) - Other abnormalities of gait and mobility  THERAPY DIAG:  Muscle weakness (generalized)  Unsteadiness on feet  Other abnormalities of gait and mobility  Rationale for Evaluation and Treatment Rehabilitation  PERTINENT HISTORY: 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  PAIN:  Are you having pain? Yes: NPRS scale: 3-4/10 Pain location: R great toe joint Pain description: achy Aggravating factors: putting pressure on it Relieving factors: taking pressure off of it;  used Voltaren  OBJECTIVE:    TODAY'S TREATMENT: 06/28/2021 Activity Comments  Tandem stance, 1 rep each position, x 30 sec Intermittent UE support  Standing balance at counter: Hip abduction alternating leg 2 x 10 reps Hip extension alternating legs 2 x 10 reps  Heel/toe raises 2 x 10 reps Hip strategy work, ant/post weightshift through  hips, 2 x 10 reps   Seated and standing pelvic tilts-anterior/posterior, with cues for finding pelvic neutral Per pt report sometimes back pain with overarching back  Forward step ups:   Step up/up, down/down 10 reps each Step ups single leg, 10 reps 1 UE support, from 4" step  Forward step downs x 10 from 4" step 1-2 UE support  Standing on solid surface, feet apart/feet together with head turns x 5, head nods x 5, with EO and EC Intermittent UE support  Standing on Airex, feet apart head turns x 5, head nods x 5 with EO; EC head steady x 10 seconds UE support    Exercises-Reviewed from HEP, with pt return demo understanding. - Standing Romberg to 3/4 Tandem Stance  - 1 x daily - 5 x weekly - 1 sets - 3 reps - 30 sec hold - Single Leg Stance with Support  - 1 x daily - 5 x weekly - 1 sets - 3 reps - 10 sec hold - Standing Marching  - 1 x daily - 5 x weekly - 2 sets - 10 reps   PATIENT EDUCATION: Education details: Additions to HEP-see instructions ([email or web address])  Person educated: Patient Education method: Explanation, Demonstration, and Handouts Education comprehension: verbalized understanding and returned demonstration   GOALS: Goals reviewed with patient? Yes   SHORT TERM GOALS: Target date: 07/20/2021   Pt will be independent with HEP for improved balance, strength, gait. Baseline: Goal status: IN PROGRESS   2.  Pt will improve 5x sit<>stand to less than or equal to 15 sec to demonstrate improved functional strength and transfer efficiency. Baseline: 18.09 sec Goal status: IN PROGRESS       LONG TERM GOALS: Target date: 08/17/2021   Pt will be independent with progression of HEP for improved strength, balance, transfers, and gait. Baseline:  Goal status: IN PROGRESS   2.  Pt will improve 5x sit<>stand to less than or equal to 12.5 sec to demonstrate improved functional strength and transfer efficiency. Baseline:   Goal status: IN PROGRESS   3.  Pt will improve DGI to at  least 18/24  for decreased fall risk. Baseline: 13/24 Goal status: IN PROGRESS   4.  Pt will improve gait velocity to at least 1.8 ft/sec for improved gait efficiency and safety. Baseline: 1.71 ft/sec Goal status: IN PROGRESS   5.  Pt will verbalize understanding of fall prevention in home environment. Baseline:  Goal status: IN PROGRESS     ASSESSMENT:   CLINICAL IMPRESSION: Progressed standing balance exercise focusing on single limb stance and varied foot postiion/eyes open/closed positions.  She is able to perform most exercises with intermittent UE support.  Increased unsteadiness with compliant surfaces and with eyes closed.  She will continue to benefit from skilled PT towards goals.   OBJECTIVE IMPAIRMENTS Abnormal gait, decreased balance, decreased mobility, difficulty walking, and decreased strength.    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    REHAB POTENTIAL: Good   CLINICAL DECISION MAKING: Evolving/moderate complexity   EVALUATION COMPLEXITY: Moderate   PLAN: PT FREQUENCY: 2x/week   PT DURATION: 8 weeks   PLANNED INTERVENTIONS: Therapeutic exercises, Therapeutic activity, Neuromuscular re-education, Balance training, Gait training, Patient/Family education, Stair training, and DME instructions   PLAN FOR NEXT SESSION: Review updates to HEP; continue to work on hip stability, SLS, tandem stance, EO and EC on solid/foam surfaces.  Gait training with SPC.    Nina Hoar W., PT 06/28/2021, 2:06 PM

## 2021-06-30 ENCOUNTER — Ambulatory Visit: Payer: Medicare Other | Admitting: Physical Therapy

## 2021-07-04 ENCOUNTER — Ambulatory Visit: Payer: Medicare Other | Admitting: Physical Therapy

## 2021-07-04 ENCOUNTER — Encounter: Payer: Self-pay | Admitting: Physical Therapy

## 2021-07-04 DIAGNOSIS — R2689 Other abnormalities of gait and mobility: Secondary | ICD-10-CM

## 2021-07-04 DIAGNOSIS — M6281 Muscle weakness (generalized): Secondary | ICD-10-CM

## 2021-07-04 DIAGNOSIS — R2681 Unsteadiness on feet: Secondary | ICD-10-CM | POA: Diagnosis not present

## 2021-07-04 NOTE — Therapy (Signed)
OUTPATIENT PHYSICAL THERAPY TREATMENT NOTE   Patient Name: Courtney Pearson MRN: [id number] DOB:Dec 06, 1950, 71 y.o., female Today's Date: 07/04/2021  PCP: Jonathon Jordan, MD REFERRING PROVIDER: Delrae Rend, MD  END OF SESSION:   PT End of Session - 07/04/21 1234     Visit Number 4    Number of Visits 16    Date for PT Re-Evaluation 08/17/21    Authorization Type Medicare    Progress Note Due on Visit 10    PT Start Time 1235    PT Stop Time 1315    PT Time Calculation (min) 40 min    Activity Tolerance Patient tolerated treatment well    Behavior During Therapy [facility] for tasks assessed/performed              Past Medical History:  Diagnosis Date   Acute on chronic diastolic heart failure ([place]) 03/12/2020   Atypical atrial flutter ([place]) 03/12/2020   CAD in native artery 03/20/2019   Chronic diastolic heart failure ([place]) 03/12/2020   Diabetes ([place])    HTN (hypertension)    Hyperlipidemia    Hypothyroidism    Palpitation    Persistent atrial fibrillation ([place]) 04/03/2017   Renal insufficiency    Sick sinus syndrome ([place]) 07/06/2020   Weakness 10/06/2020   Past Surgical History:  Procedure Laterality Date   ATRIAL FIBRILLATION ABLATION N/A 01/01/2018   Procedure: ATRIAL FIBRILLATION ABLATION;  Surgeon: Thompson Grayer, MD;  Location: [place] [facility];  Service: Cardiovascular;  Laterality: N/A;   ATRIAL FIBRILLATION ABLATION N/A 10/31/2018   Procedure: ATRIAL FIBRILLATION ABLATION;  Surgeon: Thompson Grayer, MD;  Location: [place] [facility];  Service: Cardiovascular;  Laterality: N/A;   ATRIAL FIBRILLATION ABLATION N/A 01/06/2021   Procedure: ATRIAL FIBRILLATION ABLATION;  Surgeon: Thompson Grayer, MD;  Location: [place] [facility];  Service: Cardiovascular;  Laterality: N/A;   BREAST BIOPSY     CARDIOVERSION N/A 04/30/2017   Procedure: CARDIOVERSION;  Surgeon: [name], MD;  Location: [place];  Service: Cardiovascular;  Laterality: N/A;   CARDIOVERSION N/A  02/15/2018   Procedure: CARDIOVERSION;  Surgeon: [name], MD;  Location: [place];  Service: Cardiovascular;  Laterality: N/A;   CARDIOVERSION N/A 04/10/2018   Procedure: CARDIOVERSION;  Surgeon: Josue Hector, MD;  Location: [facility];  Service: Cardiovascular;  Laterality: N/A;   [place]     Patient Active Problem List   Diagnosis Date Noted   Weakness 10/06/2020   Sick sinus syndrome ([place]) 07/06/2020   Chronic diastolic heart failure ([place]) 03/12/2020   Atypical atrial flutter ([place]) 03/12/2020   CAD in native artery 03/20/2019   Persistent atrial fibrillation ([place])    Dyspnea 10/30/2012   Palpitations 10/30/2012   Essential hypertension 10/30/2012   Hyperlipidemia 10/30/2012    REFERRING DIAG: R26.89 (ICD-10-CM) - Other abnormalities of gait and mobility  THERAPY DIAG:  Unsteadiness on feet  Other abnormalities of gait and mobility  Muscle weakness (generalized)  Rationale for Evaluation and Treatment Rehabilitation  PERTINENT HISTORY: 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  Did my exercises.  PAIN:  Are you having pain? Yes: NPRS scale: 3-4/10 Pain location: R great toe joint Pain description: achy Aggravating factors: putting pressure on  it Relieving factors: taking pressure off of it; used Voltaren  OBJECTIVE:    TODAY'S TREATMENT: 07/04/2021 Activity Comments  Reviewed HEP additions from last visit: Feet apart head turns/nods EO and EC Feet together head turns/nods EO and EC Intermittent UE support; pt return demo understanding  Standing on Airex: Feet apart EO head turns/nods x 10 Feet apart EC  head steady, 3 x 30 sec Marching in place 2 x 10 reps Heel/toe raises 2 x 10 Lateral wide BOS weightshifting 2 x 10 reps Alternating forward step/taps x 10 reps Alternating back step/taps x 10 reps Light UE support throughout  On rockerboard: Ant/posterior, then lateral weigthshifting working on ankle/hip strategies, limits of stability  Min guard assist and cues  Forward/back walking in parallel bars 3 reps x 3:  with BUE support, then 1 UE support, then no UE support   Short distance gait with cues for longer strides, arm swing with gait for improved stability with gait Pt appears more steady with longer strides.       Most recent HEP addition:  06/28/2021:  Access Code: [email or web address] URL: [email or web address] Date: 07/04/2021 Prepared by: [facility]  Exercises - Standing Romberg to 3/4 Tandem Stance  - 1 x daily - 5 x weekly - 1 sets - 3 reps - 30 sec hold - Single Leg Stance with Support  - 1 x daily - 5 x weekly - 1 sets - 3 reps - 10 sec hold - Standing Marching  - 1 x daily - 5 x weekly - 2 sets - 10 reps - Standing Balance in Corner  - 1 x daily - 5 x weekly - 1 sets - 5-10 reps - Corner Balance Feet Together With Eyes Open  - 1 x daily - 5 x weekly - 1 sets - 5-10 reps   GOALS: Goals reviewed with patient? Yes   SHORT TERM GOALS: Target date: 07/20/2021   Pt will be independent with HEP for improved balance, strength, gait. Baseline: Goal status: IN PROGRESS   2.  Pt will improve 5x sit<>stand to less than or equal to 15 sec to demonstrate improved functional strength and transfer efficiency. Baseline: 18.09 sec Goal status: IN PROGRESS       LONG TERM GOALS: Target date: 08/17/2021   Pt will be independent with progression of HEP for improved strength, balance, transfers, and gait. Baseline:  Goal status: IN PROGRESS   2.  Pt will improve 5x sit<>stand to less than or equal to 12.5 sec to demonstrate improved  functional strength and transfer efficiency. Baseline:   Goal status: IN PROGRESS   3.  Pt will improve DGI to at least 18/24  for decreased fall risk. Baseline: 13/24 Goal status: IN PROGRESS   4.  Pt will improve gait velocity to at least 1.8 ft/sec for improved gait efficiency and safety. Baseline: 1.71 ft/sec Goal status: IN PROGRESS   5.  Pt will verbalize understanding of fall prevention in home environment. Baseline:  Goal status: IN PROGRESS     ASSESSMENT:   CLINICAL IMPRESSION: Skilled PT session focused on balance work on solid and compliant surfaces, with focus on limits of stability and use of ankle/hip strategy for balance.  With ant/post limits of stability on rockerboard, pt has decreased control of finding center when UE support removed.  With short distance gait, focused on increased step length with arm swing, with PT noting improved stability with gait, versus small, shuffling steps with  short distance gait.  She will continue to benefit from skilled PT towards goals for improved overall functional mobility and decreased fall risk.  OBJECTIVE IMPAIRMENTS Abnormal gait, decreased balance, decreased mobility, difficulty walking, and decreased strength.    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    REHAB POTENTIAL: Good   CLINICAL DECISION MAKING: Evolving/moderate complexity   EVALUATION COMPLEXITY: Moderate   PLAN: PT FREQUENCY: 2x/week   PT DURATION: 8 weeks   PLANNED INTERVENTIONS: Therapeutic  exercises, Therapeutic activity, Neuromuscular re-education, Balance training, Gait training, Patient/Family education, Stair training, and DME instructions   PLAN FOR NEXT SESSION: Gait with walking poles/ gait with arm swing-consider walking program for home.  Continue to work on hip stability, SLS, tandem stance, EO and EC on solid/foam surfaces.  Gait training with SPC.    Hjalmer Iovino W., PT 07/04/2021, 2:16 PM

## 2021-07-05 ENCOUNTER — Other Ambulatory Visit: Payer: Self-pay | Admitting: Family Medicine

## 2021-07-05 DIAGNOSIS — Z1231 Encounter for screening mammogram for malignant neoplasm of breast: Secondary | ICD-10-CM

## 2021-07-07 ENCOUNTER — Encounter: Payer: Self-pay | Admitting: Physical Therapy

## 2021-07-07 ENCOUNTER — Ambulatory Visit: Payer: Medicare Other | Admitting: Physical Therapy

## 2021-07-07 DIAGNOSIS — M6281 Muscle weakness (generalized): Secondary | ICD-10-CM | POA: Diagnosis not present

## 2021-07-07 DIAGNOSIS — R2689 Other abnormalities of gait and mobility: Secondary | ICD-10-CM | POA: Diagnosis not present

## 2021-07-07 DIAGNOSIS — R2681 Unsteadiness on feet: Secondary | ICD-10-CM | POA: Diagnosis not present

## 2021-07-07 NOTE — Therapy (Signed)
OUTPATIENT PHYSICAL THERAPY TREATMENT NOTE   Patient Name: Courtney Pearson MRN: [id number] DOB:09/16/1950, 71 y.o., female Today's Date: 07/07/2021  PCP: Jonathon Jordan, MD REFERRING PROVIDER: Delrae Rend, MD  END OF SESSION:   PT End of Session - 07/07/21 1403     Visit Number 5    Number of Visits 16    Date for PT Re-Evaluation 08/17/21    Authorization Type Medicare    Progress Note Due on Visit 10    PT Start Time 7628    PT Stop Time 1444    PT Time Calculation (min) 42 min    Activity Tolerance Patient tolerated treatment well    Behavior During Therapy [facility] for tasks assessed/performed               Past Medical History:  Diagnosis Date   Acute on chronic diastolic heart failure ([place]) 03/12/2020   Atypical atrial flutter ([place]) 03/12/2020   CAD in native artery 03/20/2019   Chronic diastolic heart failure ([place]) 03/12/2020   Diabetes ([place])    HTN (hypertension)    Hyperlipidemia    Hypothyroidism    Palpitation    Persistent atrial fibrillation ([place]) 04/03/2017   Renal insufficiency    Sick sinus syndrome ([place]) 07/06/2020   Weakness 10/06/2020   Past Surgical History:  Procedure Laterality Date   ATRIAL FIBRILLATION ABLATION N/A 01/01/2018   Procedure: ATRIAL FIBRILLATION ABLATION;  Surgeon: [place] Grayer, MD;  Location: [place] [facility];  Service: Cardiovascular;  Laterality: N/A;   ATRIAL FIBRILLATION ABLATION N/A 10/31/2018   Procedure: ATRIAL FIBRILLATION ABLATION;  Surgeon: [place] Grayer, MD;  Location: [place] [facility];  Service: Cardiovascular;  Laterality: N/A;   ATRIAL FIBRILLATION ABLATION N/A 01/06/2021   Procedure: ATRIAL FIBRILLATION ABLATION;  Surgeon: [place] Grayer, MD;  Location: [place] [facility];  Service: Cardiovascular;  Laterality: N/A;   BREAST BIOPSY     CARDIOVERSION N/A 04/30/2017   Procedure: CARDIOVERSION;  Surgeon: [name], MD;  Location: [place];  Service: Cardiovascular;  Laterality: N/A;   CARDIOVERSION N/A  02/15/2018   Procedure: CARDIOVERSION;  Surgeon: [name], MD;  Location: [place];  Service: Cardiovascular;  Laterality: N/A;   CARDIOVERSION N/A 04/10/2018   Procedure: CARDIOVERSION;  Surgeon: Josue Hector, MD;  [facility] END[facility];  Service: Cardiovascular;  Laterality: N/A;   [place]     Patient Active Problem List   Diagnosis Date Noted   Weakness 10/06/2020   Sick sinus syndrome ([place]) 07/06/2020   Chronic diastolic heart failure ([place]) 03/12/2020   Atypical atrial flutter ([place]) 03/12/2020   CAD in native artery 03/20/2019   Persistent atrial fibrillation ([place])    Dyspnea 10/30/2012   Palpitations 10/30/2012   Essential hypertension 10/30/2012   Hyperlipidemia 10/30/2012    REFERRING DIAG: R26.89 (ICD-10-CM) - Other abnormalities of gait and mobility  THERAPY DIAG:  Unsteadiness on feet  Other abnormalities of gait and mobility  Muscle weakness (generalized)  Rationale for Evaluation and Treatment Rehabilitation  PERTINENT HISTORY: 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  Had episode the other day of low blood sugar/high HR earlier this week.  PAIN:  Are  you having pain? No c/o pain   OBJECTIVE:    TODAY'S TREATMENT: 07/07/2021 Activity Comments  Sit to stand, 10 reps from mat height Cues for glut set upon standing, controlled descent upon return to sit  Single limb step ups, 4" step, 6" step, x 10 reps each leg  Light UE/intermittent UE support  Single limb side step taps, 10 reps to 6" step 1 UE support  Standing on Airex: Feet apart EO head turns/nods x  10, 2 sets> progressed to EO feet together x 10 Feet apart EC head steady, 3 x 30 sec>progressed to EC feet together, 3 x 30 sec Marching in place 2 x 10 reps Alternating forward step/taps x 10 reps Alternating back step/taps x 10 reps Alternating side step taps/return to middle, x 10 reps With step tap/return to midline exercises on Airex, pt has several reps where she catches foot upon return to midline.  On rockerboard: Ant/posterior, then lateral weigthshifting working on ankle/hip strategies, limits of stability  Able to hold balance in center without UE support for 2-3 seconds in a/p and lateral directions  Short distance gait noted improved arm swing and step length.  Discussed short bouts of gait at home/driveway, 2-3 times per day (2-3 minutes each) with focused attention on arm swing/step length    PATIENT EDUCATION: Education details: Walking program for home (see above) Person educated: Patient Education method: Explanation Education comprehension: verbalized understanding   Most recent HEP addition:  06/28/2021:  Access Code: [email or web address] URL: [email or web address] Date: 07/04/2021 Prepared by: [facility]  Exercises - Standing Romberg to 3/4 Tandem Stance  - 1 x daily - 5 x weekly - 1 sets - 3 reps - 30 sec hold - Single Leg Stance with Support  - 1 x daily - 5 x weekly - 1 sets - 3 reps - 10 sec hold - Standing Marching  - 1 x daily - 5 x weekly - 2 sets - 10 reps - Standing Balance in Corner  - 1 x daily - 5 x weekly - 1 sets - 5-10 reps - Corner Balance Feet Together With Eyes Open  - 1 x daily - 5 x weekly - 1 sets - 5-10 reps   GOALS: Goals reviewed with patient? Yes   SHORT TERM GOALS: Target date: 07/20/2021   Pt will be independent with HEP for improved balance, strength, gait. Baseline: Goal status: IN PROGRESS   2.  Pt will improve 5x sit<>stand to less than or equal to 15 sec to demonstrate improved  functional strength and transfer efficiency. Baseline: 18.09 sec Goal status: IN PROGRESS       LONG TERM GOALS: Target date: 08/17/2021   Pt will be independent with progression of HEP for improved strength, balance, transfers, and gait. Baseline:  Goal status: IN PROGRESS   2.  Pt will improve 5x sit<>stand to less than or equal to 12.5 sec to demonstrate improved functional strength and transfer efficiency. Baseline:   Goal status: IN PROGRESS   3.  Pt will improve DGI to at least 18/24  for decreased fall risk. Baseline: 13/24 Goal status: IN PROGRESS   4.  Pt will improve gait velocity to at least 1.8 ft/sec for improved gait efficiency and safety. Baseline: 1.71 ft/sec Goal status: IN PROGRESS   5.  Pt will verbalize understanding of fall prevention in home environment. Baseline:  Goal status: IN PROGRESS     ASSESSMENT:   CLINICAL IMPRESSION: Continued  focus on balance activities on compliant surfaces today.  With repetition for vision removed activities on Airex foam, pt demo improved stability with repetition.  She also demonstrates improved control with rockerboard stationary standing exercises as compared to first trial of these last visit.  She will continue to benefit from skilled PT towards goals for improved balance, functional mobility, and decreased fall risk.    OBJECTIVE IMPAIRMENTS Abnormal gait, decreased balance, decreased mobility, difficulty walking, and decreased strength.    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    REHAB POTENTIAL: Good   CLINICAL DECISION MAKING: Evolving/moderate complexity   EVALUATION COMPLEXITY: Moderate   PLAN: PT FREQUENCY: 2x/week   PT DURATION: 8 weeks   PLANNED INTERVENTIONS: Therapeutic exercises, Therapeutic activity, Neuromuscular re-education, Balance training, Gait training, Patient/Family education, Stair training, and DME instructions   PLAN FOR NEXT SESSION: Ask about walking at home.  Gait with walking poles/ gait with arm swing.  Continue to work on hip stability, SLS, tandem stance, EO and EC on solid/foam surfaces.  Gait training with SPC.    Rollin Kotowski W., PT 07/07/2021, 4:00 PM

## 2021-07-07 NOTE — Patient Instructions (Signed)
Try to walk at home or in driveway, 2-3 minutes, 2-3 times per day with attention on arm swing, step length, posture.

## 2021-07-11 ENCOUNTER — Encounter: Payer: Self-pay | Admitting: Physical Therapy

## 2021-07-11 ENCOUNTER — Ambulatory Visit: Payer: Medicare Other | Admitting: Physical Therapy

## 2021-07-11 DIAGNOSIS — R2689 Other abnormalities of gait and mobility: Secondary | ICD-10-CM

## 2021-07-11 DIAGNOSIS — R2681 Unsteadiness on feet: Secondary | ICD-10-CM | POA: Diagnosis not present

## 2021-07-11 DIAGNOSIS — M6281 Muscle weakness (generalized): Secondary | ICD-10-CM | POA: Diagnosis not present

## 2021-07-11 NOTE — Therapy (Signed)
OUTPATIENT PHYSICAL THERAPY TREATMENT NOTE   Patient Name: Courtney Pearson MRN: [id number] DOB:[birth year], 71 y.o., female Today's Date: 07/11/2021  PCP: Jonathon Jordan, MD REFERRING PROVIDER: Delrae Rend, MD  END OF SESSION:   PT End of Session - 07/11/21 1151     Visit Number 6    Number of Visits 16    Date for PT Re-Evaluation 08/17/21    Authorization Type Medicare    Progress Note Due on Visit 10    PT Start Time 1023    PT Stop Time 1105    PT Time Calculation (min) 42 min    Equipment Utilized During Treatment Gait belt    Activity Tolerance Patient tolerated treatment well    Behavior During Therapy WFL for tasks assessed/performed                Past Medical History:  Diagnosis Date   Acute on chronic diastolic heart failure ([place]) 03/12/2020   Atypical atrial flutter ([place]) 03/12/2020   CAD in native artery 03/20/2019   Chronic diastolic heart failure ([place]) 03/12/2020   Diabetes ([place])    HTN (hypertension)    Hyperlipidemia    Hypothyroidism    Palpitation    Persistent atrial fibrillation ([place]) 04/03/2017   Renal insufficiency    Sick sinus syndrome ([place]) 07/06/2020   Weakness 10/06/2020   Past Surgical History:  Procedure Laterality Date   ATRIAL FIBRILLATION ABLATION N/A 01/01/2018   Procedure: ATRIAL FIBRILLATION ABLATION;  Surgeon: [place] Grayer, MD;  Location: [place] [facility];  Service: Cardiovascular;  Laterality: N/A;   ATRIAL FIBRILLATION ABLATION N/A 10/31/2018   Procedure: ATRIAL FIBRILLATION ABLATION;  Surgeon: [place] Grayer, MD;  Location: [place] [facility];  Service: Cardiovascular;  Laterality: N/A;   ATRIAL FIBRILLATION ABLATION N/A 01/06/2021   Procedure: ATRIAL FIBRILLATION ABLATION;  Surgeon: [place] Grayer, MD;  Location: [place] [facility];  Service: Cardiovascular;  Laterality: N/A;   BREAST BIOPSY     CARDIOVERSION N/A 04/30/2017   Procedure: CARDIOVERSION;  Surgeon: [name], MD;  Location: [place];  Service:  Cardiovascular;  Laterality: N/A;   CARDIOVERSION N/A 02/15/2018   Procedure: CARDIOVERSION;  Surgeon: [name], MD;  Location: [place];  Service: Cardiovascular;  Laterality: N/A;   CARDIOVERSION N/A 04/10/2018   Procedure: CARDIOVERSION;  Surgeon: Josue Hector, MD;  Location: [facility];  Service: Cardiovascular;  Laterality: N/A;   [place]     Patient Active Problem List   Diagnosis Date Noted   Weakness 10/06/2020   Sick sinus syndrome ([place]) 07/06/2020   Chronic diastolic heart failure ([place]) 03/12/2020   Atypical atrial flutter ([place]) 03/12/2020   CAD in native artery 03/20/2019   Persistent atrial fibrillation ([place])    Dyspnea 10/30/2012   Palpitations 10/30/2012   Essential hypertension 10/30/2012   Hyperlipidemia 10/30/2012    REFERRING DIAG: R26.89 (ICD-10-CM) - Other abnormalities of gait and mobility  THERAPY DIAG:  Unsteadiness on feet  Other abnormalities of gait and mobility  Muscle weakness (generalized)  Rationale for Evaluation and Treatment Rehabilitation  PERTINENT HISTORY: 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  Noticing some improvements in balance and more awareness in things to  help my balance.  Stairs and steps still seem difficult.  PAIN:  Are you having pain? No c/o pain   OBJECTIVE:    TODAY'S TREATMENT: 07/11/2021 Activity Comments  5x sit<>stand:  17.94 sec without UE support From 18" clinic chair  Single limb step ups, 4" step, 6" step, x 15 reps each leg  Trailing leg in air, BUE support, cues to increase hold time for quad strength on stance  leg  Forward step downs from 4" step, BUE support x 10 reps   Single limb side step taps, 15 reps to 6" step 1 UE support  Standing on airex, forward step taps 15 reps to 6" step   Forward/back walking 20 ft x 3 reps Mild LOB 1-2 times in posterior direction; min guard  Sidestepping R and L 20 ft x 2, with added arm swing, then with added head turns R and L Min guard  Forward walking with head turns, 20 ft x 4 reps; then with head nods 20 ft x 2 Increased posterior weightshift and near posterior LOB with head turns, min guard  Stagger stance forward/back rocking 2 x 10 reps   Standing heel raises 2 x 10 reps; seated heel raises 2 x 10 reps (3 sec hold); seated toe raises 2 x 10 reps          Most recent HEP addition:  06/28/2021:  Access Code: [email or web address] URL: [email or web address] Date: 07/04/2021 Prepared by: [facility]  Exercises - Standing Romberg to 3/4 Tandem Stance  - 1 x daily - 5 x weekly - 1 sets - 3 reps - 30 sec hold - Single Leg Stance with Support  - 1 x daily - 5 x weekly - 1 sets - 3 reps - 10 sec hold - Standing Marching  - 1 x daily - 5 x weekly - 2 sets - 10 reps - Standing Balance in Corner  - 1 x daily - 5 x weekly - 1 sets - 5-10 reps - Corner Balance Feet Together With Eyes Open  - 1 x daily - 5 x weekly - 1 sets - 5-10 reps   GOALS: Goals reviewed with patient? Yes   SHORT TERM GOALS: Target date: 07/20/2021   Pt will be independent with HEP for improved balance, strength, gait. Baseline: Goal status: IN PROGRESS   2.  Pt will improve 5x sit<>stand to less than or equal to 15 sec to demonstrate improved functional strength and transfer efficiency. Baseline: 18.09 sec Goal status: IN PROGRESS       LONG TERM GOALS: Target date: 08/17/2021   Pt will be independent with progression of HEP for improved strength, balance, transfers, and gait. Baseline:  Goal status: IN PROGRESS   2.  Pt will improve 5x  sit<>stand to less than or equal to 12.5 sec to demonstrate improved functional strength and transfer efficiency. Baseline:   Goal status: IN PROGRESS   3.  Pt will improve DGI to at least 18/24  for decreased fall risk. Baseline: 13/24 Goal status: IN PROGRESS   4.  Pt will improve gait velocity to at least 1.8 ft/sec for improved gait efficiency and safety. Baseline: 1.71 ft/sec Goal status: IN PROGRESS   5.  Pt will verbalize understanding of fall prevention in home environment. Baseline:  Goal status: IN PROGRESS     ASSESSMENT:   CLINICAL IMPRESSION: Skilled PT session today focused on single limb stance, functional strengthening and dynamic gait.  With  dynamic gait activities, pt demonstrates increased posterior weightshift and several mild LOB.  Addressed at end of session with anterior/posterior weightshifting through hip/ankles, with some limitations due to ankle weakness/flat-foot position.  She continues to benefit from skilled PT to address goals for improved balance, functional mobility.  May look to decrease frequency, as pt may be out of town travelling and pt is likely to continue to be compliant with HEP.  OBJECTIVE IMPAIRMENTS Abnormal gait, decreased balance, decreased mobility, difficulty walking, and decreased strength.    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    REHAB POTENTIAL: Good   CLINICAL DECISION MAKING: Evolving/moderate complexity   EVALUATION COMPLEXITY: Moderate   PLAN: PT FREQUENCY:  2x/week   PT DURATION: 8 weeks   PLANNED INTERVENTIONS: Therapeutic exercises, Therapeutic activity, Neuromuscular re-education, Balance training, Gait training, Patient/Family education, Stair training, and DME instructions   PLAN FOR NEXT SESSION: Update HEP for dynamic gait, progression of balance.  Gait with walking poles/ gait with arm swing.  Continue to work on hip stability, SLS, tandem stance, EO and EC on solid/foam surfaces.  Gait training with SPC.    Tulani Kidney W., PT 07/11/2021, 11:59 AM

## 2021-07-12 ENCOUNTER — Ambulatory Visit
Admission: RE | Admit: 2021-07-12 | Discharge: 2021-07-12 | Disposition: A | Discharge status: Home / Self Care | Payer: Medicare Other | Source: Ambulatory Visit | Attending: Family Medicine | Admitting: Family Medicine

## 2021-07-12 DIAGNOSIS — E2839 Other primary ovarian failure: Secondary | ICD-10-CM

## 2021-07-12 DIAGNOSIS — M85852 Other specified disorders of bone density and structure, left thigh: Secondary | ICD-10-CM | POA: Diagnosis not present

## 2021-07-12 DIAGNOSIS — Z780 Asymptomatic menopausal state: Secondary | ICD-10-CM | POA: Diagnosis not present

## 2021-07-12 DIAGNOSIS — M810 Age-related osteoporosis without current pathological fracture: Secondary | ICD-10-CM | POA: Diagnosis not present

## 2021-07-12 DIAGNOSIS — Z1231 Encounter for screening mammogram for malignant neoplasm of breast: Secondary | ICD-10-CM

## 2021-07-13 ENCOUNTER — Encounter (HOSPITAL_BASED_OUTPATIENT_CLINIC_OR_DEPARTMENT_OTHER): Payer: Self-pay

## 2021-08-05 ENCOUNTER — Ambulatory Visit (HOSPITAL_COMMUNITY): Payer: Medicare Other | Attending: Cardiology

## 2021-08-05 DIAGNOSIS — I5033 Acute on chronic diastolic (congestive) heart failure: Secondary | ICD-10-CM | POA: Diagnosis not present

## 2021-08-05 DIAGNOSIS — I480 Paroxysmal atrial fibrillation: Secondary | ICD-10-CM | POA: Diagnosis not present

## 2021-08-05 LAB — ECHOCARDIOGRAM COMPLETE
Area-P 1/2: 3.65 cm2
MV VTI: 1.4 cm2
S' Lateral: 2.6 cm

## 2021-08-16 ENCOUNTER — Ambulatory Visit (INDEPENDENT_AMBULATORY_CARE_PROVIDER_SITE_OTHER): Payer: Medicare Other

## 2021-08-16 ENCOUNTER — Encounter (HOSPITAL_BASED_OUTPATIENT_CLINIC_OR_DEPARTMENT_OTHER): Payer: Self-pay | Admitting: Family

## 2021-08-16 ENCOUNTER — Ambulatory Visit (INDEPENDENT_AMBULATORY_CARE_PROVIDER_SITE_OTHER): Payer: Medicare Other | Admitting: Family

## 2021-08-16 VITALS — BP 148/74 | HR 64 | Ht 66.0 in | Wt 191.0 lb

## 2021-08-16 DIAGNOSIS — I050 Rheumatic mitral stenosis: Secondary | ICD-10-CM | POA: Diagnosis not present

## 2021-08-16 DIAGNOSIS — E785 Hyperlipidemia, unspecified: Secondary | ICD-10-CM | POA: Diagnosis not present

## 2021-08-16 DIAGNOSIS — I10 Essential (primary) hypertension: Secondary | ICD-10-CM | POA: Diagnosis not present

## 2021-08-16 DIAGNOSIS — I484 Atypical atrial flutter: Secondary | ICD-10-CM | POA: Diagnosis not present

## 2021-08-16 DIAGNOSIS — R001 Bradycardia, unspecified: Secondary | ICD-10-CM

## 2021-08-16 DIAGNOSIS — I25118 Atherosclerotic heart disease of native coronary artery with other forms of angina pectoris: Secondary | ICD-10-CM

## 2021-08-16 DIAGNOSIS — I5032 Chronic diastolic (congestive) heart failure: Secondary | ICD-10-CM | POA: Diagnosis not present

## 2021-08-16 NOTE — Progress Notes (Signed)
Office Visit    Patient Name: Courtney Pearson Date of Encounter: 08/16/2021  PCP:  Jonathon Jordan, [place]  Cardiologist:  [name], MD  Advanced Practice Provider:  No care team member to display Electrophysiologist:  None    Chief Complaint    Courtney Pearson is a 71 y.o. female with a hx of persistent atrial fibrillation/flutter s/p ablation 12/2017 and DCCV, CAD, hypertension, hyperlipidemia, diabetes presents today for hypertension follow up.    Past Medical History    Past Medical History:  Diagnosis Date   Acute on chronic diastolic heart failure ([place]) 03/12/2020   Atypical atrial flutter ([place]) 03/12/2020   CAD in native artery 03/20/2019   Chronic diastolic heart failure ([place]) 03/12/2020   Diabetes ([place])    HTN (hypertension)    Hyperlipidemia    Hypothyroidism    Palpitation    Persistent atrial fibrillation ([place]) 04/03/2017   Renal insufficiency    Sick sinus syndrome ([place]) 07/06/2020   Weakness 10/06/2020   Past Surgical History:  Procedure Laterality Date   ATRIAL FIBRILLATION ABLATION N/A 01/01/2018   Procedure: ATRIAL FIBRILLATION ABLATION;  Surgeon: Thompson Grayer, MD;  Location: [facility];  Service: Cardiovascular;  Laterality: N/A;   ATRIAL FIBRILLATION ABLATION N/A 10/31/2018   Procedure: ATRIAL FIBRILLATION ABLATION;  Surgeon: Thompson Grayer, MD;  Location: [facility];  Service: Cardiovascular;  Laterality: N/A;   ATRIAL FIBRILLATION ABLATION N/A 01/06/2021   Procedure: ATRIAL FIBRILLATION ABLATION;  Surgeon: Thompson Grayer, MD;  Location: [facility];  Service: Cardiovascular;  Laterality: N/A;   BREAST BIOPSY     BREAST EXCISIONAL BIOPSY Right 1993   CARDIOVERSION N/A 04/30/2017   Procedure: CARDIOVERSION;  Surgeon: [name], MD;  Location: [facility];  Service: Cardiovascular;  Laterality: N/A;   CARDIOVERSION N/A 02/15/2018   Procedure: CARDIOVERSION;  Surgeon: [name], MD;  Location: [facility];  Service: Cardiovascular;  Laterality: N/A;   CARDIOVERSION N/A 04/10/2018   Procedure: CARDIOVERSION;  Surgeon: Josue Hector, MD;  Location: [facility];  Service: Cardiovascular;  Laterality: N/A;   CESAREAN SECTION     EYE SURGERY      Allergies  No Known Allergies  History of Present Illness    Courtney Pearson is a 71 y.o. female with a hx of persistent atrial fibrillation/flutter s/p ablation 12/2017 and DCCV and repeat ablation 01/12/2019, CAD, hypertension, hyperlipidemia, diabetes last seen 06/14/21  She was seen by endocrinology 03/2017 and noted to be in atrial flutter.  Followed in the atrial fibrillation clinic with echo 04/12/2017 with EF 55 to 60%, mildly elevated pulmonary pressures, small pericardial effusion with no tamponade.  Underwent cardioversion 04/2017 but had recurrent atrial fibrillation.  Event monitor 05/2017 with 50% atrial fibrillation burden as well as 3.3-second postconversion pause.  She started on flecainide.  Also been on amiodarone and failed multiple cardioversion.  Underwent atrial fibrillation ablation 12/2017.  Recurrent atrial fibrillation DCCV that was unsuccessful.  Palliation CT with calcium score 1712.  Myoview 03/2019 negative for ischemia.  She has had consultation for possible maze procedure with cardiothoracic surgery.  Referred back to the atrial fibrillation clinic and underwent repeat ablation 12/2020.  02/08/2021 beta-blocker was held for symptomatic bradycardia.  Seen 04/05/21 by Dr. Oval Linsey and due to elevated blood pressure losartan was resumed at 50 mg daily.  She had atypical chest pain with Lexiscan Myoview 04/12/2021 was low risk study with LVEF 82%.  She saw Dr.  Allred 04/14/2021 and was doing well from cardiac perspective and was recommended for EP follow-up in 6 months.  Seen 06/14/21. BP was elevated at home often >130/80. She was taking Losartan '25mg'$  BID and was recommended to increase to '25mg'$  AM and  '50mg'$  PM.   She presents today for follow up. 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 Reports no shortness of breath nor dyspnea on exertion. Reports no chest pain, pressure, or tightness. No edema, orthopnea, PND. Reports no palpitations. Does note occasional lightheadedness. Her Bp at home has been at goal of <130/80. Occasional readings as low as 110s/60s. HR has routinely been in the 40s with the exception of one day it was in the 90s. Some irregularly by Jodelle Red - difficult to determine atrial fib vs atrial arrhythmia due to artifact.   EKGs/Labs/Other Studies Reviewed:   The following studies were reviewed today:  Myoview3/21/23   The study is normal. The study is low risk.   No ST deviation was noted.   LV perfusion is normal.   Left ventricular function is normal. Nuclear stress EF: 82 %. The left ventricular ejection fraction is hyperdynamic (>65%). End diastolic cavity size is normal.   Prior study available for comparison from 04/01/2019. No changes compared to prior study.   Low risk stress nuclear study with normal perfusion and normal left ventricular regional and global systolic function.  Afib Ablation 01/06/21 CONCLUSIONS: 1. Clockwise mitral annular reentrant atrial flutter upon presentation successfully ablated along the mitral isthmus  2. Intracardiac echo reveals a large sized left atrium. 3.  All 4 PVs were quiescent from the prior ablation.  There was a trivial amount of electrical activity along the top of the left superior pulmonary vein.  Ablation was performed in this location. The patient did not require additional ablation of the pulmonary veins. 5. Additional mapping and ablation within the left atrium due to persistence of atrial fibrillation with a posterior wall box demonstrated  6. Multiple atypical atrial flutter circuits observed.  Successful cardioversion to sinus rhythm 7. No inducible arrhythmias following ablation 8. No early apparent  complications.   CT Cardiac Morph 12/30/20 IMPRESSION: 1. There is normal pulmonary vein drainage into the left atrium with ostial measurements above. Left common PV ostium. Measurement similar to prior study.   2. There is no thrombus in the left atrial appendage on delayed imaging.   3. The esophagus runs in proximity to the RUPV ostium   4. No PFO/ASD.   5. Normal coronary origin. Right dominance.   6. CAC score of 3153 which is 99th percentile for age-, race-, and sex-matched controls.   Echo 08/04/2020:  1. Left ventricular ejection fraction, by estimation, is 55 to 60%. The  left ventricle has normal function. The left ventricle has no regional  wall motion abnormalities. There is mild concentric left ventricular  hypertrophy. Diastolic function is  indeterminant due to Afib.   2. Right ventricular systolic function is normal. The right ventricular  size is normal. There is normal pulmonary artery systolic pressure. The  estimated right ventricular systolic pressure is 44.0 mmHg.   3. Left atrial size was mildly dilated.   4. Right atrial size was mildly dilated.   5. A small pericardial effusion is present. The pericardial effusion is  posterior to the left ventricle.   6. The mitral valve is abnormal. There is moderate thickening of the  mitral valve leaflet(s). There is moderate calcification of the mitral  valve leaflet(s). Moderate mitral  annular calcification. Mild mitral valve  regurgitation. Mild mitral stenosis.  MVA by continuity is 1.4cm2, mean gradient 16mHg at variable HR.   7. The aortic valve is tricuspid. There is mild thickening of the aortic  valve. Aortic valve regurgitation is not visualized. Mild aortic valve  sclerosis is present, with no evidence of aortic valve stenosis.   8. The inferior vena cava is normal in size with greater than 50%  respiratory variability, suggesting right atrial pressure of 3 mmHg.   Comparison(s): Compared to prior TTE  in 12/2018, there is no significant  change.    Monitor 08/02/2020: 14 day Zio Monitor   Quality: Fair.  Baseline artifact. Predominant rhythm: atrial flutter Average heart rate: 92 bpm Max heart rate: 134 bpm Min heart rate: 47 bpm Pauses >2.5 seconds: One pause 3.2 sec   Rare PVCs   [place] 02/24/2020: Right: Resting right ankle-brachial index indicates noncompressible right  lower extremity arteries. The right toe-brachial index is normal.   Left: Resting left ankle-brachial index indicates noncompressible left  lower extremity arteries. The left toe-brachial index is normal.    Lexiscan Myoview 04/01/19: There was no ST segment deviation noted during stress. The study is normal. This is a low risk study with no evidence of ischemia. Systolic function is not assessed due to atrial fibrillation.   Echo 01/01/2019:  1. Left ventricular ejection fraction, by visual estimation, is 55 to  60%. The left ventricle has normal function. There is mildly increased  left ventricular hypertrophy.   2. Elevated left ventricular end-diastolic pressure.   3. Left ventricular diastolic parameters are indeterminate.   4. Global right ventricle has normal systolic function.The right  ventricular size is normal. No increase in right ventricular wall  thickness.   5. Left atrial size was mildly dilated.   6. Right atrial size was mildly dilated.   7. Moderate mitral annular calcification.   8. The mitral valve is abnormal in structure. Trivial mitral valve  regurgitation. Mitral valve mean diastolic gradient is 5 mmHg, heart rate  variable.   9. The tricuspid valve is normal in structure. Tricuspid valve  regurgitation mild-moderate.  10. The aortic valve is tricuspid. Aortic valve regurgitation is not  visualized. No evidence of aortic valve sclerosis or stenosis.  11. The pulmonic valve was normal in structure. Pulmonic valve  regurgitation is trivial.  12. Mildly elevated pulmonary artery  systolic pressure.  13. The inferior vena cava is normal in size with <50% respiratory  variability, suggesting right atrial pressure of 8 mmHg.  14. Small pericardial effusion.  15. The pericardial effusion is posterior to the left ventricle.   Pre-ablation cardiac CT 10/25/18: IMPRESSION: 1. Single left pulmonary vein with measurements as above. Ostial diameter measurements noted to be similar to CTA dated 12/25/2017, however area measurements noted to be smaller on this study for RUPV and left common trunk. 2. There is no thrombus in the left atrial appendage. 3. The esophagus runs in close proximity to the RUPV ostium. 4. Severe biatrial enlargement. 5. 3 vessel calcified atherosclerosis. Coronary calcium score of 1712.   14 Day Event Monitor 06/07/17:   Quality: Fair.  Baseline artifact. Predominant rhythm: atrial fibrillation 58%.  Rates up to 110 bpm. Average heart rate: 77 bpm 3.3 second post conversion pause noted. PACs and PVCs, atrial bigeminy noted    Echo 04/12/17: Study Conclusions   - Left ventricle: The cavity size was normal. Wall thickness was   normal. Systolic function was normal. The  estimated ejection   fraction was in the range of 55% to 60%. Wall motion was normal;   there were no regional wall motion abnormalities. The study is   not technically sufficient to allow evaluation of LV diastolic   function. - Aortic valve: Trileaflet; mildly thickened, mildly calcified   leaflets. - Mitral valve: Calcified annulus. Mildly thickened leaflets . - Pulmonary arteries: Systolic pressure was mildly to moderately   increased. PA peak pressure: 46 mm Hg (S). - Pericardium, extracardiac: A small pericardial effusion was   identified circumferential to the heart, mostly along the   posterior and right ventricular free wall. There was no evidence   of hemodynamic compromise.    EKG:  No EKG today.   Recent Labs: 12/13/2020: BUN 31; Creatinine, Ser 1.40;  Hemoglobin 11.4; Platelets 216; Potassium 4.1; Sodium 136  Recent Lipid Panel No results found for: "CHOL", "TRIG", "HDL", "CHOLHDL", "VLDL", "LDLCALC", "LDLDIRECT"  Risk Assessment/Calculations:   CHA2DS2-VASc Score = 5  This indicates a 7.2% annual risk of stroke. The patient's score is based upon: CHF History: 1 HTN History: 1 Diabetes History: 0 Stroke History: 0 Vascular Disease History: 1 Age Score: 1 Gender Score: 1  Home Medications   Current Meds  Medication Sig   acetaminophen (TYLENOL) 500 MG tablet Take 500 mg by mouth every 8 (eight) hours as needed for moderate pain or headache.   amoxicillin (AMOXIL) 500 MG capsule Take 4 capsules by mouth See admin instructions. 1 hour prior to dental appointments   apixaban (ELIQUIS) 5 MG TABS tablet TAKE 1 TABLET TWICE DAILY   atorvastatin (LIPITOR) 10 MG tablet Take 10 mg by mouth every other day.    bismuth subsalicylate (PEPTO BISMOL) 262 MG/15ML suspension Take 30 mLs by mouth daily as needed for indigestion.   calcium carbonate (TUMS EX) 750 MG chewable tablet Chew 750 mg by mouth daily.    Cholecalciferol (VITAMIN D-3) 25 MCG (1000 UT) CAPS Take 1,000 Units by mouth daily.   fexofenadine (ALLEGRA) 180 MG tablet Take 180 mg by mouth as needed for allergies or rhinitis.   furosemide (LASIX) 40 MG tablet Take 40-80 mg by mouth See admin instructions. 80 mg on Monday, Wednesday, and Friday only Take 40 mg all other days   hydrocortisone cream 1 % Apply 1 application topically daily as needed for itching.   insulin aspart (NOVOLOG) 100 UNIT/ML injection Inject 25 Units into the skin daily. Via insulin pump   levothyroxine (SYNTHROID) 75 MCG tablet 1 tablet in the morning on an empty stomach   loperamide (IMODIUM A-D) 2 MG tablet Take 2-4 mg by mouth as needed for diarrhea or loose stools.    losartan (COZAAR) 50 MG tablet Please take '25mg'$  ( half tablet) at lunch time and '50mg'$  ( whole tablet) at bedtime   Multiple  Vitamins-Minerals (MULTIVITAMIN ADULT PO) Take 1 tablet by mouth once a week.    Review of Systems      All other systems reviewed and are otherwise negative except as noted above.  Physical Exam    VS:  BP (!) 148/74   Pulse 64   Ht '5\' 6"'$  (1.676 m)   Wt 191 lb (86.6 kg)   BMI 30.83 kg/m  , BMI Body mass index is 30.83 kg/m.  Wt Readings from Last 3 Encounters:  08/16/21 191 lb (86.6 kg)  06/14/21 194 lb 6.4 oz (88.2 kg)  04/14/21 193 lb 9.6 oz (87.8 kg)    GEN: Well nourished, well developed, in  no acute distress. HEENT: normal. Neck: Supple, no JVD, carotid bruits, or masses. Cardiac: RRR, no murmurs, rubs, or gallops. No clubbing, cyanosis, edema.  Radials/PT 2+ and equal bilaterally.  Respiratory:  Respirations regular and unlabored, clear to auscultation bilaterally. GI: Soft, nontender, nondistended. MS: No deformity or atrophy. Skin: Warm and dry, no rash. Neuro:  Strength and sensation are intact. Psych: Normal affect.  Assessment & Plan    HTN - BP at goal <130/80 by home monitoring. Continue Losartan '25mg'$  AM and '50mg'$  PM. Notes occasional lightheadedness with position changes - encouraged adequate hydration, continue compression socks, make position changes slowly.   Atypical atrial flutter s/p ablation / Hypercoagulable state - Follows with Dr. Rayann Heman of EP. Continue Eliquis '5mg'$  BID, does not meet dose reduction criteria. CHA2DS2-VASc Score = 5 [CHF History: 1, HTN History: 1, Diabetes History: 0, Stroke History: 0, Vascular Disease History: 1, Age Score: 1, Gender Score: 1].  Therefore, the patient's annual risk of stroke is 7.2 %.    Beta blocker previously discontinued due to bradycardia.Due to some lightheadedness with bradycardia in the 40s, 5-7 day ZIO placed in clinic.   Chronic diastolic heart failure / MItral stenosis - Euvolemic and well compensated on exam. Echo 07/2021 normal LVEF, gr2DD, mild to moderate mitral stenosis. Continue current dose Lasix '80mg'$  and  '40mg'$ . No beta blocker due to bradycardia. Low sodium diet, fluid restriction <2L, and daily weights encouraged. Educated to contact our office for weight gain of 2 lbs overnight or 5 lbs in one week.   CAD - Cardiac CT 12/2020 coronary calcium score 3153. Lexiscan 03/2021 low risk study. GDMT includes Atorvastatin. No aspirin due to chronic anticoagulation. No Metoprolol due to previous bradycardia. Heart healthy diet and regular cardiovascular exercise encouraged.    DM2 - Continue to follow with PCP.   HLD, LDL goal <70 - 05/2021 LDL 65. Continue Atorvastatin '10mg'$  QD.   Hypothyroidism - Continue to follow with PCP.   Disposition: Follow up in 3 month(s)  with [name], MD or APP.  Signed, Loel Dubonnet, NP 08/16/2021, 10:04 AM [place]

## 2021-08-16 NOTE — Patient Instructions (Addendum)
Medication Instructions:  Continue your current medications.   *If you need a refill on your cardiac medications before your next appointment, please call your pharmacy*   Lab Work: None ordered today.  Testing/Procedures: Your physician has recommended that you wear a 5-7 day Zio monitor.   This monitor is a medical device that records the heart's electrical activity. Doctors most often use these monitors to diagnose arrhythmias. Arrhythmias are problems with the speed or rhythm of the heartbeat. The monitor is a small device applied to your chest. You can wear one while you do your normal daily activities. While wearing this monitor if you have any symptoms to push the button and record what you felt. Once you have worn this monitor for the period of time provider prescribed (for 5-7 days), you will return the monitor device in the postage paid box. Once it is returned they will download the data collected and provide [place] with a report which the provider will then review and we will call you with those results. Important tips:  Avoid showering during the first 24 hours of wearing the monitor. Avoid excessive sweating to help maximize wear time. Do not submerge the device, no hot tubs, and no swimming pools. Keep any lotions or oils away from the patch. After 24 hours you may shower with the patch on. Take brief showers with your back facing the shower head.  Do not remove patch once it has been placed because that will interrupt data and decrease adhesive wear time. Push the button when you have any symptoms and write down what you were feeling. Once you have completed wearing your monitor, remove and place into box which has postage paid and place in your outgoing mailbox.  If for some reason you have misplaced your box then call our office and we can provide another box and/or mail it off for you.   Follow-Up: At [facility], you and your health needs are our priority.  As part of our  continuing mission to provide you with exceptional heart care, we have created designated Provider Care Teams.  These Care Teams include your primary Cardiologist (physician) and Advanced Practice Providers (APPs -  Physician Assistants and Nurse Practitioners) who all work together to provide you with the care you need, when you need it.  We recommend signing up for the patient portal called "MyChart".  Sign up information is provided on this After Visit Summary.  MyChart is used to connect with patients for Virtual Visits (Telemedicine).  Patients are able to view lab/test results, encounter notes, upcoming appointments, etc.  Non-urgent messages can be sent to your provider as well.   To learn more about what you can do with MyChart, go to [email or web address].    Your next appointment:   3 month(s)  The format for your next appointment:   In Person  Provider:   [name], MD or [name], NP    Other Instructions  Heart Healthy Diet Recommendations: A low-salt diet is recommended. Meats should be grilled, baked, or boiled. Avoid fried foods. Focus on lean protein sources like fish or chicken with vegetables and fruits. The American Heart Association is a Microbiologist!  American Heart Association Diet and Lifeystyle Recommendations   Exercise recommendations: The American Heart Association recommends 150 minutes of moderate intensity exercise weekly. Try 30 minutes of moderate intensity exercise 4-5 times per week. This could include walking, jogging, or swimming.   Important Information About Sugar

## 2021-08-23 ENCOUNTER — Encounter (HOSPITAL_BASED_OUTPATIENT_CLINIC_OR_DEPARTMENT_OTHER): Payer: Self-pay

## 2021-08-29 DIAGNOSIS — R001 Bradycardia, unspecified: Secondary | ICD-10-CM | POA: Diagnosis not present

## 2021-09-09 DIAGNOSIS — N1832 Chronic kidney disease, stage 3b: Secondary | ICD-10-CM | POA: Diagnosis not present

## 2021-09-09 DIAGNOSIS — Z794 Long term (current) use of insulin: Secondary | ICD-10-CM | POA: Diagnosis not present

## 2021-09-09 DIAGNOSIS — I10 Essential (primary) hypertension: Secondary | ICD-10-CM | POA: Diagnosis not present

## 2021-09-09 DIAGNOSIS — E11319 Type 2 diabetes mellitus with unspecified diabetic retinopathy without macular edema: Secondary | ICD-10-CM | POA: Diagnosis not present

## 2021-09-09 DIAGNOSIS — E1022 Type 1 diabetes mellitus with diabetic chronic kidney disease: Secondary | ICD-10-CM | POA: Diagnosis not present

## 2021-09-09 DIAGNOSIS — E1042 Type 1 diabetes mellitus with diabetic polyneuropathy: Secondary | ICD-10-CM | POA: Diagnosis not present

## 2021-09-09 DIAGNOSIS — E039 Hypothyroidism, unspecified: Secondary | ICD-10-CM | POA: Diagnosis not present

## 2021-09-12 ENCOUNTER — Encounter: Payer: Self-pay | Admitting: Physical Therapy

## 2021-09-12 NOTE — Therapy (Unsigned)
[facility] [address], [place], [place], [address] Phone: [phone]   Fax:  [phone]  Patient Details  Name: Courtney Pearson MRN: [id number] Date of Birth: May 20, 1950 Referring Provider:  No ref. provider found  Encounter Date: 09/12/2021 PHYSICAL THERAPY DISCHARGE SUMMARY  Visits from Start of Care: 6  Current functional level related to goals / functional outcomes: STGs and LTGs were not fully assessed, due to pt not returning (due to plans to be out of town) after 07/11/21 visit.   Remaining deficits: See eval-balance, weakness   Education / Equipment: Initiated HEP, not able to fully progress, due to pt not returning.   Patient agrees to discharge. Patient goals were not met. Patient is being discharged due to not returning since the last visit.   [name], PT 09/12/2021, 7:58 AM  [facility] [address] [place], [place], [address] Phone: [phone]   Fax:  [phone]

## 2021-09-30 ENCOUNTER — Ambulatory Visit (INDEPENDENT_AMBULATORY_CARE_PROVIDER_SITE_OTHER): Payer: Medicare Other | Admitting: Podiatry

## 2021-09-30 DIAGNOSIS — Z7901 Long term (current) use of anticoagulants: Secondary | ICD-10-CM

## 2021-09-30 DIAGNOSIS — B351 Tinea unguium: Secondary | ICD-10-CM | POA: Diagnosis not present

## 2021-09-30 NOTE — Progress Notes (Unsigned)
Subjective: Chief Complaint  Patient presents with   Ingrown Toenail    Right hallux ingrown toenail, A1c-7.2 Bg-150 Nail trim    LEFT hallux nail worse than right, left nail thick  No drainage, swelling.   Denies any systemic complaints such as fevers, chills, nausea, vomiting. No acute changes since last appointment, and no other complaints at this time.   Objective: AAO x3, NAD DP/PT pulses palpable bilaterally, CRT less than 3 seconds Left hallux dried blood at tip (where she "pinched the skin tyring to trim it).   No pain with calf compression, swelling, warmth, erythema  Assessment:  Plan: -All treatment options discussed with the patient including all alternatives, risks, complications.   -Patient encouraged to call the office with any questions, concerns, change in symptoms.

## 2021-10-18 ENCOUNTER — Ambulatory Visit: Payer: Medicare Other | Attending: Cardiology | Admitting: Cardiology

## 2021-10-18 ENCOUNTER — Encounter: Payer: Self-pay | Admitting: Cardiology

## 2021-10-18 VITALS — BP 140/53 | HR 61 | Ht 66.0 in | Wt 190.0 lb

## 2021-10-18 DIAGNOSIS — I2510 Atherosclerotic heart disease of native coronary artery without angina pectoris: Secondary | ICD-10-CM

## 2021-10-18 DIAGNOSIS — I4819 Other persistent atrial fibrillation: Secondary | ICD-10-CM | POA: Insufficient documentation

## 2021-10-18 DIAGNOSIS — D6869 Other thrombophilia: Secondary | ICD-10-CM | POA: Insufficient documentation

## 2021-10-18 NOTE — Patient Instructions (Signed)
Medication Instructions:  Your physician recommends that you continue on your current medications as directed. Please refer to the Current Medication list given to you today.  *If you need a refill on your cardiac medications before your next appointment, please call your pharmacy*   Lab Work: None ordered  Testing/Procedures: None ordered   Follow-Up: At [facility], you and your health needs are our priority.  As part of our continuing mission to provide you with exceptional heart care, we have created designated Provider Care Teams.  These Care Teams include your primary Cardiologist (physician) and Advanced Practice Providers (APPs -  Physician Assistants and Nurse Practitioners) who all work together to provide you with the care you need, when you need it.   Your next appointment:   6 month(s)  The format for your next appointment:   In Person  Provider:   You will see one of the following Advanced Practice Providers on your designated Care Team:   Renee Ursuy, PA-C Michael "Andy" Tillery, PA-C    Thank you for choosing [facility]!!   Refujio Haymer, RN [phone]  Other Instructions   Important Information About Sugar           

## 2021-10-18 NOTE — Progress Notes (Signed)
Electrophysiology Office Note   Date:  10/18/2021   ID:  TAJI SATHER, DOB 1950-08-29, MRN [id number]  PCP:  Jonathon Jordan, MD  Cardiologist:  Oval Linsey Primary Electrophysiologist:  Zerek Litsey Meredith Leeds, MD    Chief Complaint: AF   History of Present Illness: Courtney Pearson is a 71 y.o. female who is being seen today for the evaluation of AF at the request of Jonathon Jordan, MD. Presenting today for electrophysiology evaluation.  She has a history significant for atrial fibrillation/flutter, coronary disease, hypertension, hyperlipidemia, diabetes.  He had ablation for atrial fibrillation 01/01/2018 with repeat ablation 01/06/2021.  The patient has had PVI with posterior wall isolation.  She is also had mitral reentry atrial flutter and has had ablation from the right superior pulmonary vein to the mitral valve.  She is continue to have short episodes of palpitations.  Palpitations occur on a weekly basis.  She says that the palpitations last a few seconds.  She wore a cardiac monitor that showed episodes of atrial fibrillation/flutter with a less than 1% burden.  She does feel weak and fatigued.  She states that her heart rate at home is in the high 40s to 50s.  She feels that this may be contributing, but she does understand that there are other likely contributing factors.  Today, she denies symptoms of palpitations, chest pain, shortness of breath, orthopnea, PND, lower extremity edema, claudication, dizziness, presyncope, syncope, bleeding, or neurologic sequela. The patient is tolerating medications without difficulties.    Past Medical History:  Diagnosis Date   Acute on chronic diastolic heart failure ([place]) 03/12/2020   Atypical atrial flutter ([place]) 03/12/2020   CAD in native artery 03/20/2019   Chronic diastolic heart failure ([place]) 03/12/2020   Diabetes ([place])    HTN (hypertension)    Hyperlipidemia    Hypothyroidism    Palpitation    Persistent atrial fibrillation  ([place]) 04/03/2017   Renal insufficiency    Sick sinus syndrome ([place]) 07/06/2020   Weakness 10/06/2020   Past Surgical History:  Procedure Laterality Date   ATRIAL FIBRILLATION ABLATION N/A 01/01/2018   Procedure: ATRIAL FIBRILLATION ABLATION;  Surgeon: Thompson Grayer, MD;  Location: [facility];  Service: Cardiovascular;  Laterality: N/A;   ATRIAL FIBRILLATION ABLATION N/A 10/31/2018   Procedure: ATRIAL FIBRILLATION ABLATION;  Surgeon: Thompson Grayer, MD;  Location: [facility];  Service: Cardiovascular;  Laterality: N/A;   ATRIAL FIBRILLATION ABLATION N/A 01/06/2021   Procedure: ATRIAL FIBRILLATION ABLATION;  Surgeon: Thompson Grayer, MD;  Location: [facility];  Service: Cardiovascular;  Laterality: N/A;   BREAST BIOPSY     BREAST EXCISIONAL BIOPSY Right 1993   CARDIOVERSION N/A 04/30/2017   Procedure: CARDIOVERSION;  Surgeon: [name], MD;  Location: [facility];  Service: Cardiovascular;  Laterality: N/A;   CARDIOVERSION N/A 02/15/2018   Procedure: CARDIOVERSION;  Surgeon: [name], MD;  Location: [facility];  Service: Cardiovascular;  Laterality: N/A;   CARDIOVERSION N/A 04/10/2018   Procedure: CARDIOVERSION;  Surgeon: Josue Hector, MD;  Location: [facility];  Service: Cardiovascular;  Laterality: N/A;   CESAREAN SECTION     EYE SURGERY       Current Outpatient Medications  Medication Sig Dispense Refill   acetaminophen (TYLENOL) 500 MG tablet Take 500 mg by mouth every 8 (eight) hours as needed for moderate pain or headache.     amoxicillin (AMOXIL) 500 MG capsule Take 4 capsules by mouth See admin instructions. 1 hour prior to dental appointments  apixaban (ELIQUIS) 5 MG TABS tablet TAKE 1 TABLET TWICE DAILY 180 tablet 1   atorvastatin (LIPITOR) 10 MG tablet Take 10 mg by mouth every other day.      bismuth subsalicylate (PEPTO BISMOL) 262 MG/15ML suspension Take 30 mLs by mouth daily as needed for indigestion.     calcium carbonate  (TUMS EX) 750 MG chewable tablet Chew 750 mg by mouth daily.      Cholecalciferol (VITAMIN D-3) 25 MCG (1000 UT) CAPS Take 1,000 Units by mouth daily.     fexofenadine (ALLEGRA) 180 MG tablet Take 180 mg by mouth as needed for allergies or rhinitis.     FIASP 100 UNIT/ML SOLN SMARTSIG:0-25 Unit(s) SUB-Q Daily     furosemide (LASIX) 40 MG tablet Take 40-80 mg by mouth See admin instructions. 80 mg on Monday, Wednesday, and Friday only Take 40 mg all other days     hydrocortisone cream 1 % Apply 1 application topically daily as needed for itching.     levothyroxine (SYNTHROID) 75 MCG tablet 1 tablet in the morning on an empty stomach     loperamide (IMODIUM A-D) 2 MG tablet Take 2-4 mg by mouth as needed for diarrhea or loose stools.      losartan (COZAAR) 50 MG tablet Please take '25mg'$  ( half tablet) at lunch time and '50mg'$  ( whole tablet) at bedtime 120 tablet 3   Multiple Vitamins-Minerals (MULTIVITAMIN ADULT PO) Take 1 tablet by mouth once a week.     insulin aspart (NOVOLOG) 100 UNIT/ML injection Inject 25 Units into the skin daily. Via insulin pump (Patient not taking: Reported on 10/18/2021)     No current facility-administered medications for this visit.    Allergies:   Patient has no known allergies.   Social History:  The patient  reports that she has never smoked. She has never used smokeless tobacco. She reports that she does not drink alcohol and does not use drugs.   Family History:  The patient's family history includes Anemia in her father; Asthma in her sister; Breast cancer in her maternal aunt; Heart disease in an other family member; Hypertension in her mother; Leukemia in her brother; Lupus in her child; OCD in her sister; Osteoarthritis in her mother and sister; Osteoporosis in her mother.    ROS:  Please see the history of present illness.   Otherwise, review of systems is positive for none.   All other systems are reviewed and negative.    PHYSICAL EXAM: VS:  BP (!)  140/53   Pulse 61   Ht '5\' 6"'$  (1.676 m)   Wt 190 lb (86.2 kg)   SpO2 99%   BMI 30.67 kg/m  , BMI Body mass index is 30.67 kg/m. GEN: Well nourished, well developed, in no acute distress  HEENT: normal  Neck: no JVD, carotid bruits, or masses Cardiac: RRR; no murmurs, rubs, or gallops,no edema  Respiratory:  clear to auscultation bilaterally, normal work of breathing GI: soft, nontender, nondistended, + BS MS: no deformity or atrophy  Skin: warm and dry Neuro:  Strength and sensation are intact Psych: euthymic mood, full affect  EKG:  EKG is ordered today. Personal review of the ekg ordered shows sinus rhythm, rate 63  Recent Labs: 12/13/2020: BUN 31; Creatinine, Ser 1.40; Hemoglobin 11.4; Platelets 216; Potassium 4.1; Sodium 136    Lipid Panel  No results found for: "CHOL", "TRIG", "HDL", "CHOLHDL", "VLDL", "LDLCALC", "LDLDIRECT"   Wt Readings from Last 3 Encounters:  10/18/21 190 lb (  86.2 kg)  08/16/21 191 lb (86.6 kg)  06/14/21 194 lb 6.4 oz (88.2 kg)      Other studies Reviewed: Additional studies/ records that were reviewed today include: TTE 08/05/21  Review of the above records today demonstrates:   1. Left ventricular ejection fraction, by estimation, is 65 to 70%. The  left ventricle has normal function. The left ventricle has no regional  wall motion abnormalities. Left ventricular diastolic parameters are  consistent with Grade II diastolic  dysfunction (pseudonormalization). Elevated left atrial pressure. The  average left ventricular global longitudinal strain is -26.9 %.   2. Right ventricular systolic function is mildly reduced. The right  ventricular size is normal. There is moderately elevated pulmonary artery  systolic pressure. The estimated right ventricular systolic pressure is  88.8 mmHg.   3. The mitral valve is degenerative. Mild mitral valve regurgitation.  Mild to moderate mitral stenosis. The mean mitral valve gradient is 5.0  mmHg. Moderate  mitral annular calcification. MVA 1.4 cm^2 by continuity  equation   4. The aortic valve is tricuspid. Aortic valve regurgitation is not  visualized. No aortic stenosis is present.   5. The inferior vena cava is normal in size with <50% respiratory  variability, suggesting right atrial pressure of 8 mmHg.   Cardiac monitor 09/21/2021 personally reviewed Predominant rhythm: sinus rhythm Average heart rate: 48 bpm Max heart rate: 90 bpm Min heart rate: 33 bpm Pauses >2.5 seconds: Up to 4.8s post-termination pause noted overnight 6 minutes 11 seconds of atrial fibrillation Short runs of atrial fibrillation with aberrancy    ASSESSMENT AND PLAN:  1.  Atrial fibrillation/atypical atrial flutter: CHA2DS2-VASc of 5.  Currently on Eliquis 5 mg twice daily. Her beta-blocker was stopped due to lightheadedness.  Her monitor with short episodes of atrial fibrillation and a less than 1% burden.  We Nezzie Manera make no further changes.  2.  Hypertension: Elevated on initial check.  Has been coming down in clinic.  We Katheleen Stella continue with current management.  3.  Chronic diastolic dysfunction: No obvious volume overload.  4.  Mitral valve regurgitation/stenosis: No obvious symptoms.  Continue with current management per primary cardiology.    Current medicines are reviewed at length with the patient today.   The patient does not have concerns regarding her medicines.  The following changes were made today:  none  Labs/ tests ordered today include:  Orders Placed This Encounter  Procedures   EKG 12-Lead     Disposition:   FU 6 months  Signed, Lenetta Piche Meredith Leeds, MD  10/18/2021 10:23 AM     [address] [place] [address] [phone] (office) [phone] (fax)

## 2021-11-05 DIAGNOSIS — Z23 Encounter for immunization: Secondary | ICD-10-CM | POA: Diagnosis not present

## 2021-11-16 ENCOUNTER — Ambulatory Visit (INDEPENDENT_AMBULATORY_CARE_PROVIDER_SITE_OTHER): Payer: Medicare Other | Admitting: Cardiovascular Disease

## 2021-11-16 ENCOUNTER — Encounter (HOSPITAL_BASED_OUTPATIENT_CLINIC_OR_DEPARTMENT_OTHER): Payer: Self-pay | Admitting: Cardiovascular Disease

## 2021-11-16 VITALS — BP 164/58 | HR 62 | Ht 66.0 in | Wt 189.0 lb

## 2021-11-16 DIAGNOSIS — I5032 Chronic diastolic (congestive) heart failure: Secondary | ICD-10-CM

## 2021-11-16 DIAGNOSIS — I480 Paroxysmal atrial fibrillation: Secondary | ICD-10-CM | POA: Diagnosis not present

## 2021-11-16 DIAGNOSIS — I050 Rheumatic mitral stenosis: Secondary | ICD-10-CM | POA: Diagnosis not present

## 2021-11-16 DIAGNOSIS — I2510 Atherosclerotic heart disease of native coronary artery without angina pectoris: Secondary | ICD-10-CM

## 2021-11-16 DIAGNOSIS — I10 Essential (primary) hypertension: Secondary | ICD-10-CM

## 2021-11-16 DIAGNOSIS — Z79899 Other long term (current) drug therapy: Secondary | ICD-10-CM | POA: Diagnosis not present

## 2021-11-16 DIAGNOSIS — R0609 Other forms of dyspnea: Secondary | ICD-10-CM | POA: Diagnosis not present

## 2021-11-16 HISTORY — DX: Rheumatic mitral stenosis: I05.0

## 2021-11-16 MED ORDER — LOSARTAN POTASSIUM 50 MG PO TABS: 50.0000 mg | ORAL_TABLET | Freq: Two times a day (BID) | ORAL | 3 refills | Status: DC

## 2021-11-16 NOTE — Assessment & Plan Note (Signed)
She had some mild swelling.  No orthopnea or PND.  She has chronic dyspnea that I think is more related to physical inactivity and obesity.  Referring to prep as above.  When she comes back for labs next week we will check a BNP as well.

## 2021-11-16 NOTE — Assessment & Plan Note (Signed)
Coronary calcium score was over 3000.  She has no ischemic symptoms.  She did have a nuclear stress test that was negative.  Continue with medical management.  Lipids are well controlled.  She is not on aspirin given that she is on Eliquis.  Not on beta-blocker due to bradycardia.

## 2021-11-16 NOTE — Patient Instructions (Signed)
Medication Instructions:  Your physician has recommended you make the following change in your medication:   Change: Increase Losartan '50mg'$  twice daily   *If you need a refill on your cardiac medications before your next appointment, please call your pharmacy*   Lab Work: Please return for Lab work in one week for BMP/BNP. You may come to the...   [facility] (3rd floor) [address], [place], [place] [address]  Open: 8am-Noon and 1pm-4:30pm  Please ring the doorbell on the small table when you exit the elevator and the Lab Tech will come get you  [place] [facility] [address] [place], [place], [place] [address] Open: 8am-1pm, then 2pm-4:30pm   [place]- Please see attached locations sheet stapled to your lab work with address and hours.   If you have labs (blood work) drawn today and your tests are completely normal, you will receive your results only by: [place] (if you have MyChart) OR A paper copy in the mail If you have any lab test that is abnormal or we need to change your treatment, we will call you to review the results.  Follow-Up: At [facility], you and your health needs are our priority.  As part of our continuing mission to provide you with exceptional heart care, we have created designated Provider Care Teams.  These Care Teams include your primary Cardiologist (physician) and Advanced Practice Providers (APPs -  Physician Assistants and Nurse Practitioners) who all work together to provide you with the care you need, when you need it.  We recommend signing up for the patient portal called "MyChart".  Sign up information is provided on this After Visit Summary.  MyChart is used to connect with patients for Virtual Visits (Telemedicine).  Patients are able to view lab/test results, encounter notes, upcoming appointments, etc.  Non-urgent messages can be sent to your provider as well.   To learn more  about what you can do with MyChart, go to [email or web address].    Your next appointment:   3 month(s)  The format for your next appointment:   In Person  Provider:   [name], MD or [name], NP  Other Instructions We have referred you to the PREP program, they will reach out to get you started.   Important Information About Sugar

## 2021-11-16 NOTE — Assessment & Plan Note (Signed)
She notes occasional fluttering.  Overall she has been stable.  She is not on nodal agents due to bradycardia.  She notes that her heart rate goes up to the 90s a few times per week and she thinks that is the afib.

## 2021-11-16 NOTE — Assessment & Plan Note (Signed)
Blood pressure at home has been in the 130s/50s. She doesn't think that she has any low BPs and denies dizziness.  Increase losartan '50mg'$  bid.  Encouraged her to work on increasing her exercise.  We will refer her to the prep program at the [facility].

## 2021-11-16 NOTE — Progress Notes (Signed)
Cardiology Office Note  Date:  11/16/2021   ID:  JANDI SWIGER, DOB 17-Nov-1950, MRN [id number]  PCP:  Jonathon Jordan, MD  Cardiologist:  Lilli Light Oval Linsey, MD, [facility]  Electrophysiologist: Dr. Rayann Heman Nephrologist: Dr. Joelyn Oms  No chief complaint on file.   History of Present Illness: Courtney Pearson is a 71 y.o. female with persistent atrial fibrillation/flutter s/p ablation 12/2017 and DCCV, coronary calcification, hypertension, hyperlipidemia, and diabetes here for follow up.  [name] saw her endocrinologist 03/2017 and was noted to be in atrial flutter with ventricular rates in the 120s-140s.  Propranolol was switched to metoprolol and she was started on Eliquis.  She subsequently followed up in atrial fibrillation clinic and had an echo 04/12/2017 that revealed LVEF 55 to 60% with mildly elevated pulmonary pressures.  She was noted to have a small pericardial effusion but no evidence of tamponade.  She underwent cardioversion 04/2017 but had recurrent atrial for relation.  She wore an event monitor 05/2017 that showed she was in atrial fibrillation 50% of the time.  She also had a 3.3-second postconversion pause.  She was subsequently started on flecainide.  She has also been on amiodarone and failed multiple cardioversion.  She underwent atrial fibrillation ablation 12/2017.  She had recurrent atrial fibrillation and DCCV that was unsuccessful.  She had a consultation with Dr. Orvan Seen and is considering surgical ablation.  Prior to her ablation she had a preablation CT that had a calcium score 1712.  She had a [employer] 03/2019 that was negative for ischemia.  She feels her heart skipping around.  She gets short of breath very easily.  She notices it even walking form one side of the house to the other.  She had a consultation with Dr. Orvan Seen regarding MAZE.  She is concerned that the success rate may not be high and she doesn't want to go through it if it won't work.   [name]  was referred back to atrial fibrillation clinic and was advised that Tikosyn was an option.  She continues to have frequent atrial fibrillation. She was volume overloaded and advised to take extra Lasix for 3 days. She called our office 05/2020 and her heart rate was in the 30s. She was advised to stop taking her metoprolol.  She brought strips from her Jodelle Red mobile device that showed that she was in sinus bradycardia in the 30s.  It was one of the episodes with a postconversion pause but she remained in sinus bradycardia.   Her heart rates remained poorly controlled in Afib. She was considering the MAZE procedure She was also having bradycardia at home. She wore a 14-day Zio monitor that showed an average heart rate of 92 bpm with a minimum of 47 bpm and a 3.2 second pause.   She complained of weakness and continued to be in atrial flutter. Rate control was difficult to to sick sinus syndrome. Her first ablation was ineffective and she was considering the MAZE procedure or repeat ablation. She was referred to the PREP program. Calcium score was 3153 which was 99th percentile for age-, race-, and sex-matched controls. She saw Dr. Rayann Heman 12/2020 for repeat ablation. She saw [name] in the Afib clinic 02/08/21 and was doing well. Her beta-blocker was stopped for symptomatic bradycardia. She presented to the ED 01/2021 for antiviral therapy after a positive COVID home test. She was given molnupiravir and Tessalon Perles.  At the last visit she was doing well and had no recent episodes  of Afib. She had stopped metoprolol due to bradycardia. She was exercising regularly but did have some exertional dyspnea. Given that her pre-procedural CT showed a calcium score in the 99th percentile, she had a nuclear stress test 03/2021 that was low-risk. She saw [name], NP 05/2021 and losartan was increased to 25 mg in the morning and 50 mg at night. At follow-up her blood pressure was high in the office but had been  controlled at home, so no changes were made. She reported some lightheadedness so a monitor was placed which showed her average heart rate was 48 bpm with a max of 90 and a min of 33 bpm. She also had short runs of atrial fibrillation and up to 4.8 second post-termination pauses. She saw Dr. Curt Bears 09/2021 and he did not recommend any changes.   Today, she reports everything seems stable since her last appointment. Occasionally she feels brief flutters, maybe 3 times a week. Sometimes this is detected by her Fitbit readings. Her heart rate may increase to 95-100 bpm about 3-4 times a week, but will go back down. At times she notices a minor discomfort in her chest, usually occurring randomly. She believes she has reported this same discomfort previously. At home she usually sees blood pressures in the 130s/50s. This morning it was 155/60s while preparing for today's appointment. She has not been aware of feeling any low blood pressures. Also, she continues to have LE edema. Today she is wearing her compression socks. She denies orthopnea. After taking Lasix she does notice increased urinary output. Lately she has been moderately active, but has not had much formal exercise. She is limited by feeling like she has low leg strength. Even with walking down to the mailbox and back she develops significant fatigue in her legs. During her prior physical therapy for balance, she didn't see much improvement. 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 They perform mild physical warm-ups and breathing exercises as well before the rehearsal. She denies any lightheadedness, headaches, syncope, orthopnea, or PND.  Past Medical History:  Diagnosis Date   Acute on chronic diastolic heart failure ([place]) 03/12/2020   Atypical atrial flutter ([place]) 03/12/2020   CAD in native artery 03/20/2019   Chronic diastolic heart failure ([place]) 03/12/2020   Diabetes ([place])    HTN (hypertension)    Hyperlipidemia    Hypothyroidism     Mitral stenosis 11/16/2021   Palpitation    Persistent atrial fibrillation ([place]) 04/03/2017   Renal insufficiency    Sick sinus syndrome ([place]) 07/06/2020   Weakness 10/06/2020    Past Surgical History:  Procedure Laterality Date   ATRIAL FIBRILLATION ABLATION N/A 01/01/2018   Procedure: ATRIAL FIBRILLATION ABLATION;  Surgeon: Thompson Grayer, MD;  Location: [place] [facility];  Service: Cardiovascular;  Laterality: N/A;   ATRIAL FIBRILLATION ABLATION N/A 10/31/2018   Procedure: ATRIAL FIBRILLATION ABLATION;  Surgeon: Thompson Grayer, MD;  Location: [place] [facility];  Service: Cardiovascular;  Laterality: N/A;   ATRIAL FIBRILLATION ABLATION N/A 01/06/2021   Procedure: ATRIAL FIBRILLATION ABLATION;  Surgeon: Thompson Grayer, MD;  Location: [place] [facility];  Service: Cardiovascular;  Laterality: N/A;   BREAST BIOPSY     BREAST EXCISIONAL BIOPSY Right 1993   CARDIOVERSION N/A 04/30/2017   Procedure: CARDIOVERSION;  Surgeon: [name], MD;  Location: [facility] ENDOSCOPY;  Service: Cardiovascular;  Laterality: N/A;   CARDIOVERSION N/A 02/15/2018   Procedure: CARDIOVERSION;  Surgeon: [name], MD;  Location: [place];  Service:  Cardiovascular;  Laterality: N/A;   CARDIOVERSION N/A 04/10/2018   Procedure: CARDIOVERSION;  Surgeon: Josue Hector, MD;  Location: [facility] ENDOSCOPY;  Service: Cardiovascular;  Laterality: N/A;   CESAREAN SECTION     EYE SURGERY       Current Outpatient Medications  Medication Sig Dispense Refill   acetaminophen (TYLENOL) 500 MG tablet Take 500 mg by mouth every 8 (eight) hours as needed for moderate pain or headache.     amoxicillin (AMOXIL) 500 MG capsule Take 4 capsules by mouth See admin instructions. 1 hour prior to dental appointments     apixaban (ELIQUIS) 5 MG TABS tablet TAKE 1 TABLET TWICE DAILY 180 tablet 1   atorvastatin (LIPITOR) 10 MG tablet Take 10 mg by mouth every other day.      bismuth subsalicylate (PEPTO BISMOL) 262 MG/15ML  suspension Take 30 mLs by mouth daily as needed for indigestion.     calcium carbonate (TUMS EX) 750 MG chewable tablet Chew 750 mg by mouth daily.      Cholecalciferol (VITAMIN D-3) 25 MCG (1000 UT) CAPS Take 1,000 Units by mouth daily.     fexofenadine (ALLEGRA) 180 MG tablet Take 180 mg by mouth as needed for allergies or rhinitis.     FIASP 100 UNIT/ML SOLN SMARTSIG:0-25 Unit(s) SUB-Q Daily     furosemide (LASIX) 40 MG tablet Take 40-80 mg by mouth See admin instructions. 80 mg on Monday, Wednesday, and Friday only Take 40 mg all other days     hydrocortisone cream 1 % Apply 1 application topically daily as needed for itching.     levothyroxine (SYNTHROID) 75 MCG tablet 1 tablet in the morning on an empty stomach     loperamide (IMODIUM A-D) 2 MG tablet Take 2-4 mg by mouth as needed for diarrhea or loose stools.      Multiple Vitamins-Minerals (MULTIVITAMIN ADULT PO) Take 1 tablet by mouth once a week.     losartan (COZAAR) 50 MG tablet Take 1 tablet (50 mg total) by mouth 2 (two) times daily. Please take '25mg'$  ( half tablet) at lunch time and '50mg'$  ( whole tablet) at bedtime 180 tablet 3   No current facility-administered medications for this visit.    Allergies:   Patient has no known allergies.    Social History:  The patient  reports that she has never smoked. She has never used smokeless tobacco. She reports that she does not drink alcohol and does not use drugs.   Family History:  The patient's family history includes Anemia in her father; Asthma in her sister; Breast cancer in her maternal aunt; Heart disease in an other family member; Hypertension in her mother; Leukemia in her brother; Lupus in her child; OCD in her sister; Osteoarthritis in her mother and sister; Osteoporosis in her mother.    ROS:   Please see the history of present illness. (+) Palpitations (+) Minor chest discomfort (+) Bilateral LE fatigue/weakness (+) LE edema All other systems are reviewed and  negative.   PHYSICAL EXAM: VS:  BP (!) 164/58 (BP Location: Right Arm, Patient Position: Sitting, Cuff Size: Large)   Pulse 62   Ht '5\' 6"'$  (1.676 m)   Wt 189 lb (85.7 kg)   BMI 30.51 kg/m  , BMI Body mass index is 30.51 kg/m. GENERAL:  Well appearing HEENT: Pupils equal round and reactive, fundi not visualized, oral mucosa unremarkable NECK:  No jugular venous distention, waveform within normal limits, carotid upstroke brisk and symmetric, no bruits, no  thyromegaly LUNGS:  Clear to auscultation bilaterally HEART:  RRR.  PMI not displaced or sustained,S1 and S2 within normal limits, no S3, no S4, no clicks, no rubs, no murmurs ABD:  Flat, positive bowel sounds normal in frequency in pitch, no bruits, no rebound, no guarding, no midline pulsatile mass, no hepatomegaly, no splenomegaly EXT:  2 plus pulses throughout, no edema, no cyanosis no clubbing SKIN:  No rashes no nodules NEURO:  Cranial nerves II through XII grossly intact, motor grossly intact throughout PSYCH:  Cognitively intact, oriented to person place and time  EKG:   EKG is personally reviewed. 11/16/2021:  EKG was not ordered. 10/06/2020: Atrial flutter. Rate 101 bpm. LAFB. 07/06/2020: Atrial Fibrillation. Rate 108 bpm. LAD. 03/12/20: Atrial flutter.  Rate 88 bpm.  LAFB.   06/27/19: atrial flutter.  Rate 70 bpm.  Monitor 08/2021: 7 Day Zio Monitor   Quality: Fair.  Baseline artifact. Predominant rhythm: sinus rhythm Average heart rate: 48 bpm Max heart rate: 90 bpm Min heart rate: 33 bpm Pauses >2.5 seconds: Up to 4.8s post-termination pause noted overnight 6 minutes 11 seconds of atrial fibrillation Short runs of atrial fibrillation with aberrancy   Rare ventricular couplets 4 beats NSVT  Echo  08/05/2021:  1. Left ventricular ejection fraction, by estimation, is 65 to 70%. The  left ventricle has normal function. The left ventricle has no regional  wall motion abnormalities. Left ventricular diastolic parameters  are  consistent with Grade II diastolic  dysfunction (pseudonormalization). Elevated left atrial pressure. The  average left ventricular global longitudinal strain is -26.9 %.   2. Right ventricular systolic function is mildly reduced. The right  ventricular size is normal. There is moderately elevated pulmonary artery  systolic pressure. The estimated right ventricular systolic pressure is  56.4 mmHg.   3. The mitral valve is degenerative. Mild mitral valve regurgitation.  Mild to moderate mitral stenosis. The mean mitral valve gradient is 5.0  mmHg. Moderate mitral annular calcification. MVA 1.4 cm^2 by continuity  equation   4. The aortic valve is tricuspid. Aortic valve regurgitation is not  visualized. No aortic stenosis is present.   5. The inferior vena cava is normal in size with <50% respiratory  variability, suggesting right atrial pressure of 8 mmHg.   Comparison(s): 08/04/20 EF 55-60%. PA pressure 54mHg. Mild MS, 640mg mean  PG, 1625m peak PG.   Lexiscan Myoview  04/12/2021:   The study is normal. The study is low risk.   No ST deviation was noted.   LV perfusion is normal.   Left ventricular function is normal. Nuclear stress EF: 82 %. The left ventricular ejection fraction is hyperdynamic (>65%). End diastolic cavity size is normal.   Prior study available for comparison from 04/01/2019. No changes compared to prior study.   Low risk stress nuclear study with normal perfusion and normal left ventricular regional and global systolic function.  Afib Ablation 01/06/21 CONCLUSIONS: 1. Clockwise mitral annular reentrant atrial flutter upon presentation successfully ablated along the mitral isthmus  2. Intracardiac echo reveals a large sized left atrium. 3.  All 4 PVs were quiescent from the prior ablation.  There was a trivial amount of electrical activity along the top of the left superior pulmonary vein.  Ablation was performed in this location. The patient did not require  additional ablation of the pulmonary veins. 5. Additional mapping and ablation within the left atrium due to persistence of atrial fibrillation with a posterior wall box demonstrated  6. Multiple atypical atrial  flutter circuits observed.  Successful cardioversion to sinus rhythm 7. No inducible arrhythmias following ablation 8. No early apparent complications.  CT Cardiac Morph 12/30/20 IMPRESSION: 1. There is normal pulmonary vein drainage into the left atrium with ostial measurements above. Left common PV ostium. Measurement similar to prior study.   2. There is no thrombus in the left atrial appendage on delayed imaging.   3. The esophagus runs in proximity to the RUPV ostium   4. No PFO/ASD.   5. Normal coronary origin. Right dominance.   6. CAC score of 3153 which is 99th percentile for age-, race-, and sex-matched controls.  Echo 08/04/2020:  1. Left ventricular ejection fraction, by estimation, is 55 to 60%. The  left ventricle has normal function. The left ventricle has no regional  wall motion abnormalities. There is mild concentric left ventricular  hypertrophy. Diastolic function is  indeterminant due to Afib.   2. Right ventricular systolic function is normal. The right ventricular  size is normal. There is normal pulmonary artery systolic pressure. The  estimated right ventricular systolic pressure is 00.8 mmHg.   3. Left atrial size was mildly dilated.   4. Right atrial size was mildly dilated.   5. A small pericardial effusion is present. The pericardial effusion is  posterior to the left ventricle.   6. The mitral valve is abnormal. There is moderate thickening of the  mitral valve leaflet(s). There is moderate calcification of the mitral  valve leaflet(s). Moderate mitral annular calcification. Mild mitral valve  regurgitation. Mild mitral stenosis.  MVA by continuity is 1.4cm2, mean gradient 49mHg at variable HR.   7. The aortic valve is tricuspid. There is  mild thickening of the aortic  valve. Aortic valve regurgitation is not visualized. Mild aortic valve  sclerosis is present, with no evidence of aortic valve stenosis.   8. The inferior vena cava is normal in size with greater than 50%  respiratory variability, suggesting right atrial pressure of 3 mmHg.   Comparison(s): Compared to prior TTE in 12/2018, there is no significant  change.   Monitor 08/02/2020: 14 day Zio Monitor   Quality: Fair.  Baseline artifact. Predominant rhythm: atrial flutter Average heart rate: 92 bpm Max heart rate: 134 bpm Min heart rate: 47 bpm Pauses >2.5 seconds: One pause 3.2 sec   Rare PVCs  [place] 02/24/2020: Right: Resting right ankle-brachial index indicates noncompressible right  lower extremity arteries. The right toe-brachial index is normal.   Left: Resting left ankle-brachial index indicates noncompressible left  lower extremity arteries. The left toe-brachial index is normal.   Lexiscan Myoview 04/01/19: There was no ST segment deviation noted during stress. The study is normal. This is a low risk study with no evidence of ischemia. Systolic function is not assessed due to atrial fibrillation.  Echo 01/01/2019:  1. Left ventricular ejection fraction, by visual estimation, is 55 to  60%. The left ventricle has normal function. There is mildly increased  left ventricular hypertrophy.   2. Elevated left ventricular end-diastolic pressure.   3. Left ventricular diastolic parameters are indeterminate.   4. Global right ventricle has normal systolic function.The right  ventricular size is normal. No increase in right ventricular wall  thickness.   5. Left atrial size was mildly dilated.   6. Right atrial size was mildly dilated.   7. Moderate mitral annular calcification.   8. The mitral valve is abnormal in structure. Trivial mitral valve  regurgitation. Mitral valve mean diastolic gradient is 5 mmHg,  heart rate  variable.   9. The  tricuspid valve is normal in structure. Tricuspid valve  regurgitation mild-moderate.  10. The aortic valve is tricuspid. Aortic valve regurgitation is not  visualized. No evidence of aortic valve sclerosis or stenosis.  11. The pulmonic valve was normal in structure. Pulmonic valve  regurgitation is trivial.  12. Mildly elevated pulmonary artery systolic pressure.  13. The inferior vena cava is normal in size with <50% respiratory  variability, suggesting right atrial pressure of 8 mmHg.  14. Small pericardial effusion.  15. The pericardial effusion is posterior to the left ventricle.  Pre-ablation cardiac CT 10/25/18: IMPRESSION: 1. Single left pulmonary vein with measurements as above. Ostial diameter measurements noted to be similar to CTA dated 12/25/2017, however area measurements noted to be smaller on this study for RUPV and left common trunk. 2. There is no thrombus in the left atrial appendage. 3. The esophagus runs in close proximity to the RUPV ostium. 4. Severe biatrial enlargement. 5. 3 vessel calcified atherosclerosis. Coronary calcium score of 1712.  14 Day Event Monitor 06/07/17:   Quality: Fair.  Baseline artifact. Predominant rhythm: atrial fibrillation 58%.  Rates up to 110 bpm. Average heart rate: 77 bpm 3.3 second post conversion pause noted. PACs and PVCs, atrial bigeminy noted   Echo 04/12/17: Study Conclusions   - Left ventricle: The cavity size was normal. Wall thickness was   normal. Systolic function was normal. The estimated ejection   fraction was in the range of 55% to 60%. Wall motion was normal;   there were no regional wall motion abnormalities. The study is   not technically sufficient to allow evaluation of LV diastolic   function. - Aortic valve: Trileaflet; mildly thickened, mildly calcified   leaflets. - Mitral valve: Calcified annulus. Mildly thickened leaflets . - Pulmonary arteries: Systolic pressure was mildly to moderately    increased. PA peak pressure: 46 mm Hg (S). - Pericardium, extracardiac: A small pericardial effusion was   identified circumferential to the heart, mostly along the   posterior and right ventricular free wall. There was no evidence   of hemodynamic compromise.   Recent Labs: 12/13/2020: BUN 31; Creatinine, Ser 1.40; Hemoglobin 11.4; Platelets 216; Potassium 4.1; Sodium 136   12/11/16: Sodium 140, potassium 4.4, BUN 26, creatinine 1.19 AST 22, ALT 18 Hemoglobin A1c 8.2% Total cholesterol 143, triglycerides 54, HDL 62, LDL 71   Lipid Panel No results found for: "CHOL", "TRIG", "HDL", "CHOLHDL", "VLDL", "LDLCALC", "LDLDIRECT"    Wt Readings from Last 3 Encounters:  11/16/21 189 lb (85.7 kg)  10/18/21 190 lb (86.2 kg)  08/16/21 191 lb (86.6 kg)      ASSESSMENT AND PLAN:  Paroxysmal atrial fibrillation (HCC) She notes occasional fluttering.  Overall she has been stable.  She is not on nodal agents due to bradycardia.  She notes that her heart rate goes up to the 90s a few times per week and she thinks that is the afib.  Essential hypertension Blood pressure at home has been in the 130s/50s. She doesn't think that she has any low BPs and denies dizziness.  Increase losartan '50mg'$  bid.  Encouraged her to work on increasing her exercise.  We will refer her to the prep program at the [facility].  Chronic diastolic heart failure ([place]) She had some mild swelling.  No orthopnea or PND.  She has chronic dyspnea that I think is more related to physical inactivity and obesity.  Referring to prep as above.  When  she comes back for labs next week we will check a BNP as well.  Mitral stenosis Mild to moderate mitral stenosis on echo 07/2021.  Continue to monitor.  CAD in native artery Coronary calcium score was over 3000.  She has no ischemic symptoms.  She did have a nuclear stress test that was negative.  Continue with medical management.  Lipids are well controlled.  She is not on aspirin given  that she is on Eliquis.  Not on beta-blocker due to bradycardia.   Disposition:   FU with Jamyah Folk C. Oval Linsey, MD, [facility] in 3 months  Current medicines are reviewed at length with the patient today.  The patient does not have concerns regarding medicines.  The following changes have been made: Increase losartan to '50mg'$  bid  Labs/ tests ordered today include:   Orders Placed This Encounter  Procedures   Basic metabolic panel   Brain natriuretic peptide   Amb Referral To Provider Referral Exercise Program (P.R.E.P)    I,Mathew Stumpf,acting as a scribe for [name], MD.,have documented all relevant documentation on the behalf of [name], MD,as directed by  [name], MD while in the presence of [name], MD.  I, [place] Oval Linsey, MD have reviewed all documentation for this visit.  The documentation of the exam, diagnosis, procedures, and orders on 11/16/2021 are all accurate and complete.  Signed, Saddie Sandeen C. Oval Linsey, MD, [facility]  11/16/2021 9:08 AM    [place]

## 2021-11-16 NOTE — Assessment & Plan Note (Signed)
Mild to moderate mitral stenosis on echo 07/2021.  Continue to monitor.

## 2021-11-25 DIAGNOSIS — I480 Paroxysmal atrial fibrillation: Secondary | ICD-10-CM | POA: Diagnosis not present

## 2021-11-25 DIAGNOSIS — I5032 Chronic diastolic (congestive) heart failure: Secondary | ICD-10-CM | POA: Diagnosis not present

## 2021-11-25 DIAGNOSIS — R0609 Other forms of dyspnea: Secondary | ICD-10-CM | POA: Diagnosis not present

## 2021-11-25 DIAGNOSIS — I050 Rheumatic mitral stenosis: Secondary | ICD-10-CM | POA: Diagnosis not present

## 2021-11-25 DIAGNOSIS — Z79899 Other long term (current) drug therapy: Secondary | ICD-10-CM | POA: Diagnosis not present

## 2021-11-25 DIAGNOSIS — I10 Essential (primary) hypertension: Secondary | ICD-10-CM | POA: Diagnosis not present

## 2021-11-26 ENCOUNTER — Other Ambulatory Visit (HOSPITAL_BASED_OUTPATIENT_CLINIC_OR_DEPARTMENT_OTHER): Payer: Self-pay | Admitting: Cardiovascular Disease

## 2021-11-26 DIAGNOSIS — I4819 Other persistent atrial fibrillation: Secondary | ICD-10-CM

## 2021-11-26 LAB — BASIC METABOLIC PANEL
BUN/Creatinine Ratio: 23 (ref 12–28)
BUN: 39 mg/dL — ABNORMAL HIGH (ref 8–27)
CO2: 24 mmol/L (ref 20–29)
Calcium: 9.9 mg/dL (ref 8.7–10.3)
Chloride: 102 mmol/L (ref 96–106)
Creatinine, Ser: 1.67 mg/dL — ABNORMAL HIGH (ref 0.57–1.00)
Glucose: 125 mg/dL — ABNORMAL HIGH (ref 70–99)
Potassium: 4.4 mmol/L (ref 3.5–5.2)
Sodium: 141 mmol/L (ref 134–144)
eGFR: 32 mL/min/{1.73_m2} — ABNORMAL LOW (ref >59)

## 2021-11-26 LAB — BRAIN NATRIURETIC PEPTIDE: BNP: 179.4 pg/mL — ABNORMAL HIGH (ref 0.0–100.0)

## 2021-11-28 NOTE — Telephone Encounter (Signed)
Please review for refill. Thank you! 

## 2021-11-28 NOTE — Telephone Encounter (Signed)
Prescription refill request for Eliquis received. Indication: Afib  Last office visit: 11/16/21 Oval Linsey)  Scr: 1.67 (11/25/21)  Age: 71 Weight: 85.7kg  Appropriate dose and refill sent to requested pharmacy.

## 2021-12-05 DIAGNOSIS — E039 Hypothyroidism, unspecified: Secondary | ICD-10-CM | POA: Diagnosis not present

## 2021-12-05 DIAGNOSIS — E11319 Type 2 diabetes mellitus with unspecified diabetic retinopathy without macular edema: Secondary | ICD-10-CM | POA: Diagnosis not present

## 2021-12-05 DIAGNOSIS — N1832 Chronic kidney disease, stage 3b: Secondary | ICD-10-CM | POA: Diagnosis not present

## 2021-12-05 DIAGNOSIS — I10 Essential (primary) hypertension: Secondary | ICD-10-CM | POA: Diagnosis not present

## 2021-12-05 DIAGNOSIS — Z794 Long term (current) use of insulin: Secondary | ICD-10-CM | POA: Diagnosis not present

## 2021-12-05 DIAGNOSIS — E1042 Type 1 diabetes mellitus with diabetic polyneuropathy: Secondary | ICD-10-CM | POA: Diagnosis not present

## 2021-12-05 DIAGNOSIS — E1022 Type 1 diabetes mellitus with diabetic chronic kidney disease: Secondary | ICD-10-CM | POA: Diagnosis not present

## 2021-12-07 ENCOUNTER — Encounter (INDEPENDENT_AMBULATORY_CARE_PROVIDER_SITE_OTHER): Payer: Medicare Other | Admitting: Ophthalmology

## 2021-12-07 DIAGNOSIS — E113591 Type 2 diabetes mellitus with proliferative diabetic retinopathy without macular edema, right eye: Secondary | ICD-10-CM

## 2021-12-07 DIAGNOSIS — I10 Essential (primary) hypertension: Secondary | ICD-10-CM | POA: Diagnosis not present

## 2021-12-07 DIAGNOSIS — H35033 Hypertensive retinopathy, bilateral: Secondary | ICD-10-CM | POA: Diagnosis not present

## 2021-12-07 DIAGNOSIS — E113512 Type 2 diabetes mellitus with proliferative diabetic retinopathy with macular edema, left eye: Secondary | ICD-10-CM

## 2021-12-07 DIAGNOSIS — H43813 Vitreous degeneration, bilateral: Secondary | ICD-10-CM | POA: Diagnosis not present

## 2021-12-14 ENCOUNTER — Other Ambulatory Visit (HOSPITAL_BASED_OUTPATIENT_CLINIC_OR_DEPARTMENT_OTHER): Payer: Self-pay | Admitting: Cardiovascular Disease

## 2021-12-22 ENCOUNTER — Other Ambulatory Visit (HOSPITAL_BASED_OUTPATIENT_CLINIC_OR_DEPARTMENT_OTHER): Payer: Self-pay | Admitting: Cardiovascular Disease

## 2021-12-22 ENCOUNTER — Encounter (HOSPITAL_BASED_OUTPATIENT_CLINIC_OR_DEPARTMENT_OTHER): Payer: Self-pay | Admitting: Cardiovascular Disease

## 2021-12-22 MED ORDER — FUROSEMIDE 40 MG PO TABS: 40.0000 mg | ORAL_TABLET | ORAL | 6 refills | Status: DC

## 2021-12-23 ENCOUNTER — Encounter (HOSPITAL_BASED_OUTPATIENT_CLINIC_OR_DEPARTMENT_OTHER): Payer: Self-pay | Admitting: Cardiovascular Disease

## 2022-01-23 DIAGNOSIS — M810 Age-related osteoporosis without current pathological fracture: Secondary | ICD-10-CM

## 2022-01-23 HISTORY — DX: Age-related osteoporosis without current pathological fracture: M81.0

## 2022-02-28 ENCOUNTER — Encounter (HOSPITAL_BASED_OUTPATIENT_CLINIC_OR_DEPARTMENT_OTHER): Payer: Self-pay | Admitting: Cardiovascular Disease

## 2022-02-28 ENCOUNTER — Ambulatory Visit (INDEPENDENT_AMBULATORY_CARE_PROVIDER_SITE_OTHER): Payer: Medicare Other | Admitting: Cardiovascular Disease

## 2022-02-28 VITALS — BP 132/60 | HR 57 | Ht 66.0 in | Wt 186.2 lb

## 2022-02-28 DIAGNOSIS — I2510 Atherosclerotic heart disease of native coronary artery without angina pectoris: Secondary | ICD-10-CM

## 2022-02-28 DIAGNOSIS — I10 Essential (primary) hypertension: Secondary | ICD-10-CM | POA: Diagnosis not present

## 2022-02-28 DIAGNOSIS — I4819 Other persistent atrial fibrillation: Secondary | ICD-10-CM | POA: Diagnosis not present

## 2022-02-28 DIAGNOSIS — I495 Sick sinus syndrome: Secondary | ICD-10-CM | POA: Diagnosis not present

## 2022-02-28 MED ORDER — LOSARTAN POTASSIUM 25 MG PO TABS: 25.0000 mg | ORAL_TABLET | Freq: Every evening | ORAL | 3 refills | Status: DC

## 2022-02-28 NOTE — Assessment & Plan Note (Addendum)
Currently maintaining sinus rhythm.  She is not on any nodal agents due to bradycardia as above.  Continue Eliquis.  Continue to encourage her to increase physical activity.

## 2022-02-28 NOTE — Progress Notes (Signed)
Cardiology Office Note  Date:  02/28/2022   ID:  Courtney Pearson, DOB 1950-11-13, MRN [id number]  PCP:  Jonathon Jordan, MD  Cardiologist:  Lilli Light Oval Linsey, MD, [facility]  Electrophysiologist: Dr. Rayann Heman Nephrologist: Dr. Joelyn Oms  No chief complaint on file.   History of Present Illness: Courtney Pearson is a 72 y.o. female with persistent atrial fibrillation/flutter s/p ablation 12/2017 and DCCV, coronary calcification, hypertension, hyperlipidemia, and diabetes here for follow up.  Courtney Pearson saw her endocrinologist 03/2017 and was noted to be in atrial flutter with ventricular rates in the 120s-140s.  Propranolol was switched to metoprolol and she was started on Eliquis.  She subsequently followed up in atrial fibrillation clinic and had an echo 04/12/2017 that revealed LVEF 55 to 60% with mildly elevated pulmonary pressures.  She was noted to have a small pericardial effusion but no evidence of tamponade.  She underwent cardioversion 04/2017 but had recurrent atrial for relation.  She wore an event monitor 05/2017 that showed she was in atrial fibrillation 50% of the time.  She also had a 3.3-second postconversion pause.  She was subsequently started on flecainide.  She has also been on amiodarone and failed multiple cardioversion.  She underwent atrial fibrillation ablation 12/2017.  She had recurrent atrial fibrillation and DCCV that was unsuccessful.  She had a consultation with Dr. Orvan Seen and is considering surgical ablation.  Prior to her ablation she had a preablation CT that had a calcium score 1712.  She had a [employer] 03/2019 that was negative for ischemia.  She feels her heart skipping around.  She gets short of breath very easily.  She notices it even walking form one side of the house to the other.  She had a consultation with Dr. Orvan Seen regarding MAZE.  She is concerned that the success rate may not be high and she doesn't want to go through it if it won't work.   Courtney Pearson  was referred back to atrial fibrillation clinic and was advised that Tikosyn was an option.  She continues to have frequent atrial fibrillation. She was volume overloaded and advised to take extra Lasix for 3 days. She called our office 05/2020 and her heart rate was in the 30s. She was advised to stop taking her metoprolol.  She brought strips from her Jodelle Red mobile device that showed that she was in sinus bradycardia in the 30s.  It was one of the episodes with a postconversion pause but she remained in sinus bradycardia.   Her heart rates remained poorly controlled in Afib. She was considering the MAZE procedure She was also having bradycardia at home. She wore a 14-day Zio monitor that showed an average heart rate of 92 bpm with a minimum of 47 bpm and a 3.2 second pause.   She complained of weakness and continued to be in atrial flutter. Rate control was difficult to to sick sinus syndrome. Her first ablation was ineffective and she was considering the MAZE procedure or repeat ablation. She was referred to the PREP program. Calcium score was 3153 which was 99th percentile for age-, race-, and sex-matched controls. She saw Dr. Rayann Heman 12/2020 for repeat ablation. She saw Rayshell Goecke in the Afib clinic 02/08/21 and was doing well. Her beta-blocker was stopped for symptomatic bradycardia. She presented to the ED 01/2021 for antiviral therapy after a positive COVID home test. She was given molnupiravir and Tessalon Perles.  She stopped metoprolol due to bradycardia. She had been exercising regularly but did  have some exertional dyspnea. She had a pre-procedural CT showed a calcium score in the 99th percentile, and a nuclear stress test 03/2021 that was low-risk. She saw [name], NP 05/2021 and losartan was increased to 25 mg in the morning and 50 mg at night. At follow-up her blood pressure was high in the office but had been controlled at home, so no changes were made. She reported some lightheadedness so  a monitor was placed which showed her average heart rate was 48 bpm with a max of 90 and a min of 33 bpm. She also had short runs of atrial fibrillation and up to 4.8 second post-termination pauses. She saw Dr. Curt Bears 09/2021 and he did not recommend any changes.  At the last visit she was encouraged to increase her exercise and was referred to the prep program.  Today, the patient states that she has been doing well. She has noticed some intermittent lightheadedness that occurs when standing which lasts for a few seconds. The feeling started a few weeks ago after going to lunch and kept recurring since. Last night she had gotten up to go to the bathroom and had an episode of palpitations, she checked her reading with an at home consumer EKG machine and felt dizzy upon going to get it. She was in regular rhythm, blood pressure was low at 113/51 at the time and her heart rate ranged from 48 to 54. She doesn't check her blood pressure very frequently but she states that she doesn't think it is often high and that she more often checks it when she thinks it is low. She infrequently has been taking her half dose of losartan at noon due to not wanting to lower her blood pressure.   She denies any chest pain, shortness of breath, or peripheral edema, headaches, syncope, orthopnea, or PND.  Past Medical History:  Diagnosis Date   Acute on chronic diastolic heart failure ([place]) 03/12/2020   Atypical atrial flutter ([place]) 03/12/2020   CAD in native artery 03/20/2019   Chronic diastolic heart failure ([place]) 03/12/2020   Diabetes ([place])    HTN (hypertension)    Hyperlipidemia    Hypothyroidism    Mitral stenosis 11/16/2021   Palpitation    Persistent atrial fibrillation ([place]) 04/03/2017   Renal insufficiency    Sick sinus syndrome ([place]) 07/06/2020   Weakness 10/06/2020    Past Surgical History:  Procedure Laterality Date   ATRIAL FIBRILLATION ABLATION N/A 01/01/2018   Procedure: ATRIAL FIBRILLATION ABLATION;   Surgeon: Thompson Grayer, MD;  Location: [place] [facility];  Service: Cardiovascular;  Laterality: N/A;   ATRIAL FIBRILLATION ABLATION N/A 10/31/2018   Procedure: ATRIAL FIBRILLATION ABLATION;  Surgeon: Thompson Grayer, MD;  Location: [place] [facility];  Service: Cardiovascular;  Laterality: N/A;   ATRIAL FIBRILLATION ABLATION N/A 01/06/2021   Procedure: ATRIAL FIBRILLATION ABLATION;  Surgeon: Thompson Grayer, MD;  Location: [place] [facility];  Service: Cardiovascular;  Laterality: N/A;   BREAST BIOPSY     BREAST EXCISIONAL BIOPSY Right 1993   CARDIOVERSION N/A 04/30/2017   Procedure: CARDIOVERSION;  Surgeon: [name], MD;  Location: [facility] ENDOSCOPY;  Service: Cardiovascular;  Laterality: N/A;   CARDIOVERSION N/A 02/15/2018   Procedure: CARDIOVERSION;  Surgeon: [name], MD;  Location: [place];  Service: Cardiovascular;  Laterality: N/A;   CARDIOVERSION N/A 04/10/2018   Procedure: CARDIOVERSION;  Surgeon: Josue Hector, MD;  Location: [place];  Service: Cardiovascular;  Laterality: N/A;   CESAREAN SECTION  EYE SURGERY       Current Outpatient Medications  Medication Sig Dispense Refill   acetaminophen (TYLENOL) 500 MG tablet Take 500 mg by mouth every 8 (eight) hours as needed for moderate pain or headache.     amoxicillin (AMOXIL) 500 MG capsule Take 4 capsules by mouth See admin instructions. 1 hour prior to dental appointments     apixaban (ELIQUIS) 5 MG TABS tablet TAKE 1 TABLET TWICE DAILY 180 tablet 3   atorvastatin (LIPITOR) 10 MG tablet Take 10 mg by mouth every other day.      bismuth subsalicylate (PEPTO BISMOL) 262 MG/15ML suspension Take 30 mLs by mouth daily as needed for indigestion.     calcium carbonate (TUMS EX) 750 MG chewable tablet Chew 750 mg by mouth daily.      Cholecalciferol (VITAMIN D-3) 25 MCG (1000 UT) CAPS Take 1,000 Units by mouth daily.     fexofenadine (ALLEGRA) 180 MG tablet Take 180 mg by mouth as needed for allergies or  rhinitis.     FIASP 100 UNIT/ML SOLN SMARTSIG:0-25 Unit(s) SUB-Q Daily     furosemide (LASIX) 40 MG tablet Take 1-2 tablets (40-80 mg total) by mouth See admin instructions. 80 mg on Monday, Wednesday, and Friday only Take 40 mg all other days 30 tablet 6   hydrocortisone cream 1 % Apply 1 application topically daily as needed for itching.     levothyroxine (SYNTHROID) 75 MCG tablet 1 tablet in the morning on an empty stomach     loperamide (IMODIUM A-D) 2 MG tablet Take 2-4 mg by mouth as needed for diarrhea or loose stools.      Multiple Vitamins-Minerals (MULTIVITAMIN ADULT PO) Take 1 tablet by mouth once a week.     losartan (COZAAR) 25 MG tablet Take 1 tablet (25 mg total) by mouth every evening. 90 tablet 3   No current facility-administered medications for this visit.    Allergies:   Patient has no known allergies.    Social History:  The patient  reports that she has never smoked. She has never used smokeless tobacco. She reports that she does not drink alcohol and does not use drugs.   Family History:  The patient's family history includes Anemia in her father; Asthma in her sister; Breast cancer in her maternal aunt; Heart disease in an other family member; Hypertension in her mother; Leukemia in her brother; Lupus in her child; OCD in her sister; Osteoarthritis in her mother and sister; Osteoporosis in her mother.    ROS:   Please see the history of present illness. (+) Lightheadedness (+) Dizziness (+) Palpitations All other systems are reviewed and negative.   PHYSICAL EXAM: VS:  BP 132/60 (BP Location: Left Arm, Patient Position: Sitting, Cuff Size: Large)   Pulse (!) 57   Ht '5\' 6"'$  (1.676 m)   Wt 186 lb 3.2 oz (84.5 kg)   SpO2 99%   BMI 30.05 kg/m  , BMI Body mass index is 30.05 kg/m. GENERAL:  Well appearing HEENT: Pupils equal round and reactive, fundi not visualized, oral mucosa unremarkable NECK:  No jugular venous distention, waveform within normal limits,  carotid upstroke brisk and symmetric, no bruits, no thyromegaly LUNGS:  Clear to auscultation bilaterally HEART:  RRR.  PMI not displaced or sustained,S1 and S2 within normal limits, no S3, no S4, no clicks, no rubs, no murmurs ABD:  Flat, positive bowel sounds normal in frequency in pitch, no bruits, no rebound, no guarding, no midline pulsatile mass,  no hepatomegaly, no splenomegaly EXT:  2 plus pulses throughout, no edema, no cyanosis no clubbing SKIN:  No rashes no nodules NEURO:  Cranial nerves II through XII grossly intact, motor grossly intact throughout PSYCH:  Cognitively intact, oriented to person place and time  EKG:   The EKG is personally reviewed. 02/28/2022: EKG was not ordered. 11/16/2021:  EKG was not ordered. 10/06/2020: Atrial flutter. Rate 101 bpm. LAFB. 07/06/2020: Atrial Fibrillation. Rate 108 bpm. LAD. 03/12/20: Atrial flutter.  Rate 88 bpm.  LAFB.   06/27/19: atrial flutter.  Rate 70 bpm.  Monitor 08/2021: 7 Day Zio Monitor   Quality: Fair.  Baseline artifact. Predominant rhythm: sinus rhythm Average heart rate: 48 bpm Max heart rate: 90 bpm Min heart rate: 33 bpm Pauses >2.5 seconds: Up to 4.8s post-termination pause noted overnight 6 minutes 11 seconds of atrial fibrillation Short runs of atrial fibrillation with aberrancy   Rare ventricular couplets 4 beats NSVT  Echo  08/05/2021:  1. Left ventricular ejection fraction, by estimation, is 65 to 70%. The  left ventricle has normal function. The left ventricle has no regional  wall motion abnormalities. Left ventricular diastolic parameters are  consistent with Grade II diastolic  dysfunction (pseudonormalization). Elevated left atrial pressure. The  average left ventricular global longitudinal strain is -26.9 %.   2. Right ventricular systolic function is mildly reduced. The right  ventricular size is normal. There is moderately elevated pulmonary artery  systolic pressure. The estimated right ventricular  systolic pressure is  09.6 mmHg.   3. The mitral valve is degenerative. Mild mitral valve regurgitation.  Mild to moderate mitral stenosis. The mean mitral valve gradient is 5.0  mmHg. Moderate mitral annular calcification. MVA 1.4 cm^2 by continuity  equation   4. The aortic valve is tricuspid. Aortic valve regurgitation is not  visualized. No aortic stenosis is present.   5. The inferior vena cava is normal in size with <50% respiratory  variability, suggesting right atrial pressure of 8 mmHg.   Comparison(s): 08/04/20 EF 55-60%. PA pressure 57mHg. Mild MS, 692mg mean  PG, 1625m peak PG.   Lexiscan Myoview  04/12/2021:   The study is normal. The study is low risk.   No ST deviation was noted.   LV perfusion is normal.   Left ventricular function is normal. Nuclear stress EF: 82 %. The left ventricular ejection fraction is hyperdynamic (>65%). End diastolic cavity size is normal.   Prior study available for comparison from 04/01/2019. No changes compared to prior study.   Low risk stress nuclear study with normal perfusion and normal left ventricular regional and global systolic function.  Afib Ablation 01/06/21 CONCLUSIONS: 1. Clockwise mitral annular reentrant atrial flutter upon presentation successfully ablated along the mitral isthmus  2. Intracardiac echo reveals a large sized left atrium. 3.  All 4 PVs were quiescent from the prior ablation.  There was a trivial amount of electrical activity along the top of the left superior pulmonary vein.  Ablation was performed in this location. The patient did not require additional ablation of the pulmonary veins. 5. Additional mapping and ablation within the left atrium due to persistence of atrial fibrillation with a posterior wall box demonstrated  6. Multiple atypical atrial flutter circuits observed.  Successful cardioversion to sinus rhythm 7. No inducible arrhythmias following ablation 8. No early apparent complications.  CT  Cardiac Morph 12/30/20 IMPRESSION: 1. There is normal pulmonary vein drainage into the left atrium with ostial measurements above. Left common PV ostium. Measurement  similar to prior study.   2. There is no thrombus in the left atrial appendage on delayed imaging.   3. The esophagus runs in proximity to the RUPV ostium   4. No PFO/ASD.   5. Normal coronary origin. Right dominance.   6. CAC score of 3153 which is 99th percentile for age-, race-, and sex-matched controls.  Echo 08/04/2020:  1. Left ventricular ejection fraction, by estimation, is 55 to 60%. The  left ventricle has normal function. The left ventricle has no regional  wall motion abnormalities. There is mild concentric left ventricular  hypertrophy. Diastolic function is  indeterminant due to Afib.   2. Right ventricular systolic function is normal. The right ventricular  size is normal. There is normal pulmonary artery systolic pressure. The  estimated right ventricular systolic pressure is 23.5 mmHg.   3. Left atrial size was mildly dilated.   4. Right atrial size was mildly dilated.   5. A small pericardial effusion is present. The pericardial effusion is  posterior to the left ventricle.   6. The mitral valve is abnormal. There is moderate thickening of the  mitral valve leaflet(s). There is moderate calcification of the mitral  valve leaflet(s). Moderate mitral annular calcification. Mild mitral valve  regurgitation. Mild mitral stenosis.  MVA by continuity is 1.4cm2, mean gradient 63mHg at variable HR.   7. The aortic valve is tricuspid. There is mild thickening of the aortic  valve. Aortic valve regurgitation is not visualized. Mild aortic valve  sclerosis is present, with no evidence of aortic valve stenosis.   8. The inferior vena cava is normal in size with greater than 50%  respiratory variability, suggesting right atrial pressure of 3 mmHg.   Comparison(s): Compared to prior TTE in 12/2018, there is no  significant  change.   Monitor 08/02/2020: 14 day Zio Monitor   Quality: Fair.  Baseline artifact. Predominant rhythm: atrial flutter Average heart rate: 92 bpm Max heart rate: 134 bpm Min heart rate: 47 bpm Pauses >2.5 seconds: One pause 3.2 sec   Rare PVCs  [place] 02/24/2020: Right: Resting right ankle-brachial index indicates noncompressible right  lower extremity arteries. The right toe-brachial index is normal.   Left: Resting left ankle-brachial index indicates noncompressible left  lower extremity arteries. The left toe-brachial index is normal.   Lexiscan Myoview 04/01/19: There was no ST segment deviation noted during stress. The study is normal. This is a low risk study with no evidence of ischemia. Systolic function is not assessed due to atrial fibrillation.  Echo 01/01/2019:  1. Left ventricular ejection fraction, by visual estimation, is 55 to  60%. The left ventricle has normal function. There is mildly increased  left ventricular hypertrophy.   2. Elevated left ventricular end-diastolic pressure.   3. Left ventricular diastolic parameters are indeterminate.   4. Global right ventricle has normal systolic function.The right  ventricular size is normal. No increase in right ventricular wall  thickness.   5. Left atrial size was mildly dilated.   6. Right atrial size was mildly dilated.   7. Moderate mitral annular calcification.   8. The mitral valve is abnormal in structure. Trivial mitral valve  regurgitation. Mitral valve mean diastolic gradient is 5 mmHg, heart rate  variable.   9. The tricuspid valve is normal in structure. Tricuspid valve  regurgitation mild-moderate.  10. The aortic valve is tricuspid. Aortic valve regurgitation is not  visualized. No evidence of aortic valve sclerosis or stenosis.  11. The pulmonic valve  was normal in structure. Pulmonic valve  regurgitation is trivial.  12. Mildly elevated pulmonary artery systolic pressure.  13. The  inferior vena cava is normal in size with <50% respiratory  variability, suggesting right atrial pressure of 8 mmHg.  14. Small pericardial effusion.  15. The pericardial effusion is posterior to the left ventricle.  Pre-ablation cardiac CT 10/25/18: IMPRESSION: 1. Single left pulmonary vein with measurements as above. Ostial diameter measurements noted to be similar to CTA dated 12/25/2017, however area measurements noted to be smaller on this study for RUPV and left common trunk. 2. There is no thrombus in the left atrial appendage. 3. The esophagus runs in close proximity to the RUPV ostium. 4. Severe biatrial enlargement. 5. 3 vessel calcified atherosclerosis. Coronary calcium score of 1712.  14 Day Event Monitor 06/07/17:   Quality: Fair.  Baseline artifact. Predominant rhythm: atrial fibrillation 58%.  Rates up to 110 bpm. Average heart rate: 77 bpm 3.3 second post conversion pause noted. PACs and PVCs, atrial bigeminy noted   Echo 04/12/17: Study Conclusions   - Left ventricle: The cavity size was normal. Wall thickness was   normal. Systolic function was normal. The estimated ejection   fraction was in the range of 55% to 60%. Wall motion was normal;   there were no regional wall motion abnormalities. The study is   not technically sufficient to allow evaluation of LV diastolic   function. - Aortic valve: Trileaflet; mildly thickened, mildly calcified   leaflets. - Mitral valve: Calcified annulus. Mildly thickened leaflets . - Pulmonary arteries: Systolic pressure was mildly to moderately   increased. PA peak pressure: 46 mm Hg (S). - Pericardium, extracardiac: A small pericardial effusion was   identified circumferential to the heart, mostly along the   posterior and right ventricular free wall. There was no evidence   of hemodynamic compromise.   Recent Labs: 11/25/2021: BNP 179.4; BUN 39; Creatinine, Ser 1.67; Potassium 4.4; Sodium 141   12/11/16: Sodium 140,  potassium 4.4, BUN 26, creatinine 1.19 AST 22, ALT 18 Hemoglobin A1c 8.2% Total cholesterol 143, triglycerides 54, HDL 62, LDL 71   Lipid Panel No results found for: "CHOL", "TRIG", "HDL", "CHOLHDL", "VLDL", "LDLCALC", "LDLDIRECT"    Wt Readings from Last 3 Encounters:  02/28/22 186 lb 3.2 oz (84.5 kg)  11/16/21 189 lb (85.7 kg)  10/18/21 190 lb (86.2 kg)      ASSESSMENT AND PLAN:  Sick sinus syndrome (HCC) She has had sinus pauses on monitoring.  She checked her cardia mobile device and her heart rate was in the 40s recently when she was feeling poorly.  I suspect that her bradycardia is becoming more symptomatic.  Blood pressures have also been somewhat low at that time.  What we will do is reduce her losartan to 25 mg in the evenings.  She will track both her blood pressure and heart rate twice daily.  In 1 month we will determine whether she needs to be referred back to EP for consideration of a pacemaker.  Persistent atrial fibrillation (HCC) Currently maintaining sinus rhythm.  She is not on any nodal agents due to bradycardia as above.  Continue Eliquis.  Continue to encourage her to increase physical activity.  CAD in native artery Coronary calcification noted on her preablation CT.  Subsequent nuclear stress testing was negative for ischemia.  She has no symptoms.  She is not on aspirin given that she is on Eliquis.  Continue atorvastatin.  No beta-blockers due to bradycardia as  above.  Essential hypertension Reducing losartan due to dizziness as above.  Check blood pressures and heart rates twice daily and bring to follow-up.   Current medicines are reviewed at length with the patient today.  The patient does not have concerns regarding medicines.  The following changes have been made: Increase losartan to '50mg'$  bid  Labs/ tests ordered today include:   No orders of the defined types were placed in this encounter.  Disposition:   FU with Delvis Kau C. Oval Linsey, MD, [facility]  in 6 months   FU with [name] in 1 month to monitor medication changes.  Plan: Possible pacemaker due to low heart rate. Reduce losartan half (to 25) at night and monitor blood pressure and heart rate 2x a day to see if it modulates symptoms.  I,Coren O'Brien,acting as a Education administrator for [place], MD.,have documented all relevant documentation on the behalf of [name], MD,as directed by  [name], MD while in the presence of [name], MD.  I, [place] Oval Linsey, MD have reviewed all documentation for this visit.  The documentation of the exam, diagnosis, procedures, and orders on 02/28/2022 are all accurate and complete.

## 2022-02-28 NOTE — Assessment & Plan Note (Signed)
Reducing losartan due to dizziness as above.  Check blood pressures and heart rates twice daily and bring to follow-up.

## 2022-02-28 NOTE — Patient Instructions (Addendum)
Medication Instructions:  DECREASE LOSARTAN TO 25 MG DAILY IN THE EVENING   *If you need a refill on your cardiac medications before your next appointment, please call your pharmacy*   Lab Work: NONE   Testing/Procedures: NONE  Follow-Up: At [facility], you and your health needs are our priority.  As part of our continuing mission to provide you with exceptional heart care, we have created designated Provider Care Teams.  These Care Teams include your primary Cardiologist (physician) and Advanced Practice Providers (APPs -  Physician Assistants and Nurse Practitioners) who all work together to provide you with the care you need, when you need it.  We recommend signing up for the patient portal called "MyChart".  Sign up information is provided on this After Visit Summary.  MyChart is used to connect with patients for Virtual Visits (Telemedicine).  Patients are able to view lab/test results, encounter notes, upcoming appointments, etc.  Non-urgent messages can be sent to your provider as well.   To learn more about what you can do with MyChart, go to [email or web address].    Your next appointment:   1 month(s)  Provider:   [name], MD or [name], NP    IF FOLLOW UP WITH [place] SEE DR [place] IN 6 MONTHS  Other Instructions MONITOR AND LOG BLOOD PRESSURE/HEART RATE TWICE A DAY. BRING LOG TO FOLLOW UP IN 1 MONTH   Exercise recommendations: The American Heart Association recommends 150 minutes of moderate intensity exercise weekly. Try 30 minutes of moderate intensity exercise 4-5 times per week. This could include walking, jogging, or swimming.

## 2022-02-28 NOTE — Assessment & Plan Note (Signed)
Coronary calcification noted on her preablation CT.  Subsequent nuclear stress testing was negative for ischemia.  She has no symptoms.  She is not on aspirin given that she is on Eliquis.  Continue atorvastatin.  No beta-blockers due to bradycardia as above.

## 2022-02-28 NOTE — Assessment & Plan Note (Signed)
She has had sinus pauses on monitoring.  She checked her cardia mobile device and her heart rate was in the 40s recently when she was feeling poorly.  I suspect that her bradycardia is becoming more symptomatic.  Blood pressures have also been somewhat low at that time.  What we will do is reduce her losartan to 25 mg in the evenings.  She will track both her blood pressure and heart rate twice daily.  In 1 month we will determine whether she needs to be referred back to EP for consideration of a pacemaker.

## 2022-03-02 ENCOUNTER — Encounter (HOSPITAL_BASED_OUTPATIENT_CLINIC_OR_DEPARTMENT_OTHER): Payer: Self-pay | Admitting: Cardiovascular Disease

## 2022-03-02 ENCOUNTER — Encounter (HOSPITAL_COMMUNITY): Payer: Self-pay | Admitting: *Deleted

## 2022-03-02 MED ORDER — FUROSEMIDE 40 MG PO TABS: 40.0000 mg | ORAL_TABLET | ORAL | 3 refills | Status: DC

## 2022-03-07 DIAGNOSIS — E1042 Type 1 diabetes mellitus with diabetic polyneuropathy: Secondary | ICD-10-CM | POA: Diagnosis not present

## 2022-03-07 DIAGNOSIS — E11319 Type 2 diabetes mellitus with unspecified diabetic retinopathy without macular edema: Secondary | ICD-10-CM | POA: Diagnosis not present

## 2022-03-07 DIAGNOSIS — E1022 Type 1 diabetes mellitus with diabetic chronic kidney disease: Secondary | ICD-10-CM | POA: Diagnosis not present

## 2022-03-07 DIAGNOSIS — I10 Essential (primary) hypertension: Secondary | ICD-10-CM | POA: Diagnosis not present

## 2022-03-07 DIAGNOSIS — Z794 Long term (current) use of insulin: Secondary | ICD-10-CM | POA: Diagnosis not present

## 2022-03-07 DIAGNOSIS — E039 Hypothyroidism, unspecified: Secondary | ICD-10-CM | POA: Diagnosis not present

## 2022-03-07 DIAGNOSIS — N1832 Chronic kidney disease, stage 3b: Secondary | ICD-10-CM | POA: Diagnosis not present

## 2022-03-22 ENCOUNTER — Telehealth: Payer: Self-pay

## 2022-03-22 NOTE — Patient Outreach (Signed)
  Care Coordination   03/22/2022 Name: Courtney Pearson MRN: [id number] DOB: [birth year]   Care Coordination Outreach Attempts:  An unsuccessful telephone outreach was attempted today to offer the patient information about available care coordination services as a benefit of their health plan.   Follow Up Plan:  Additional outreach attempts will be made to offer the patient care coordination information and services.   Encounter Outcome:  No Answer   Care Coordination Interventions:  No, not indicated    Peter Garter RN, BSN,CCM, CDE Care Management Coordinator [facility] [phone]

## 2022-03-24 ENCOUNTER — Ambulatory Visit (INDEPENDENT_AMBULATORY_CARE_PROVIDER_SITE_OTHER): Payer: Medicare Other | Admitting: Podiatry

## 2022-03-24 ENCOUNTER — Encounter: Payer: Self-pay | Admitting: Podiatry

## 2022-03-24 DIAGNOSIS — B351 Tinea unguium: Secondary | ICD-10-CM

## 2022-03-24 DIAGNOSIS — M79675 Pain in left toe(s): Secondary | ICD-10-CM | POA: Diagnosis not present

## 2022-03-24 DIAGNOSIS — M79674 Pain in right toe(s): Secondary | ICD-10-CM

## 2022-03-24 NOTE — Progress Notes (Signed)
This patient returns to my office for at risk foot care.  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  This patient is unable to cut nails himself since the patient cannot reach his nails.These nails are painful walking and wearing shoes.  This patient presents for at risk foot care today.  General Appearance  Alert, conversant and in no acute stress.  Vascular  Dorsalis pedis and posterior tibial  pulses are palpable  bilaterally.  Capillary return is within normal limits  bilaterally. Temperature is within normal limits  bilaterally.  Neurologic  Senn-Weinstein monofilament wire test diminished bilaterally. Muscle power within normal limits bilaterally.  Nails Thick disfigured discolored nails with subungual debris  from hallux to fifth toes bilaterally. No evidence of bacterial infection or drainage bilaterally.  Orthopedic  No limitations of motion  feet .  No crepitus or effusions noted.  No bony pathology or digital deformities noted.  Midfoot  DJD  B/L.  Mild  HAV  B/L.  Skin  normotropic skin with no porokeratosis noted bilaterally.  No signs of infections or ulcers noted.     Onychomycosis  Pain in right toes  Pain in left toes  Consent was obtained for treatment procedures.   Mechanical debridement of nails 1-5  bilaterally performed with a nail nipper.  Filed with dremel without incident.    Return office visit   prn                  Told patient to return for periodic foot care and evaluation due to potential at risk complications.   Gardiner Barefoot DPM

## 2022-03-28 ENCOUNTER — Ambulatory Visit (INDEPENDENT_AMBULATORY_CARE_PROVIDER_SITE_OTHER): Payer: Medicare Other | Admitting: Family

## 2022-03-28 ENCOUNTER — Encounter (HOSPITAL_BASED_OUTPATIENT_CLINIC_OR_DEPARTMENT_OTHER): Payer: Self-pay | Admitting: Family

## 2022-03-28 VITALS — BP 142/68 | HR 55 | Ht 66.0 in | Wt 190.0 lb

## 2022-03-28 DIAGNOSIS — I5032 Chronic diastolic (congestive) heart failure: Secondary | ICD-10-CM

## 2022-03-28 DIAGNOSIS — I10 Essential (primary) hypertension: Secondary | ICD-10-CM | POA: Diagnosis not present

## 2022-03-28 DIAGNOSIS — D6859 Other primary thrombophilia: Secondary | ICD-10-CM

## 2022-03-28 DIAGNOSIS — I480 Paroxysmal atrial fibrillation: Secondary | ICD-10-CM

## 2022-03-28 NOTE — Patient Instructions (Signed)
Medication Instructions:  Continue your current medications.   *If you need a refill on your cardiac medications before your next appointment, please call your pharmacy*  Lab Work/Testing/Procedures: None ordered today.   Follow-Up: At [facility], you and your health needs are our priority.  As part of our continuing mission to provide you with exceptional heart care, we have created designated Provider Care Teams.  These Care Teams include your primary Cardiologist (physician) and Advanced Practice Providers (APPs -  Physician Assistants and Nurse Practitioners) who all work together to provide you with the care you need, when you need it.  We recommend signing up for the patient portal called "MyChart".  Sign up information is provided on this After Visit Summary.  MyChart is used to connect with patients for Virtual Visits (Telemedicine).  Patients are able to view lab/test results, encounter notes, upcoming appointments, etc.  Non-urgent messages can be sent to your provider as well.   To learn more about what you can do with MyChart, go to [email or web address].    Your next appointment:   As scheduled with Dr. Oval Linsey  Other Instructions  We have referred you to PREP exercise program at the [facility].  Recommend starting an exercise program to gradually increase activity.   We will get you in for your routine follow up with Dr. Curt Bears.   If we do not find that your exercise tolerance or level of activity does not increase with exercise Dr. Curt Bears may consider a pacemaker.

## 2022-03-28 NOTE — Progress Notes (Unsigned)
Office Visit    Patient Name: Courtney Pearson Date of Encounter: 03/28/2022  PCP:  Jonathon Jordan, [place]  Cardiologist:  [name], MD  Advanced Practice Provider:  No care team member to display Electrophysiologist:  Will Meredith Leeds, MD    Chief Complaint    Courtney Pearson is a 72 y.o. female with a hx of persistent atrial fibrillation/flutter s/p ablation 12/2017 and DCCV, CAD, hypertension, hyperlipidemia, diabetes presents today for hypertension follow up.    Past Medical History    Past Medical History:  Diagnosis Date   Acute on chronic diastolic heart failure ([place]) 03/12/2020   Atypical atrial flutter ([place]) 03/12/2020   CAD in native artery 03/20/2019   Chronic diastolic heart failure ([place]) 03/12/2020   Diabetes ([place])    HTN (hypertension)    Hyperlipidemia    Hypothyroidism    Mitral stenosis 11/16/2021   Palpitation    Persistent atrial fibrillation ([place]) 04/03/2017   Renal insufficiency    Sick sinus syndrome ([place]) 07/06/2020   Weakness 10/06/2020   Past Surgical History:  Procedure Laterality Date   ATRIAL FIBRILLATION ABLATION N/A 01/01/2018   Procedure: ATRIAL FIBRILLATION ABLATION;  Surgeon: Thompson Grayer, MD;  Location: [place] [facility];  Service: Cardiovascular;  Laterality: N/A;   ATRIAL FIBRILLATION ABLATION N/A 10/31/2018   Procedure: ATRIAL FIBRILLATION ABLATION;  Surgeon: Thompson Grayer, MD;  Location: [place] [facility];  Service: Cardiovascular;  Laterality: N/A;   ATRIAL FIBRILLATION ABLATION N/A 01/06/2021   Procedure: ATRIAL FIBRILLATION ABLATION;  Surgeon: Thompson Grayer, MD;  Location: [place] [facility];  Service: Cardiovascular;  Laterality: N/A;   BREAST BIOPSY     BREAST EXCISIONAL BIOPSY Right 1993   CARDIOVERSION N/A 04/30/2017   Procedure: CARDIOVERSION;  Surgeon: [name], MD;  Location: [place];  Service: Cardiovascular;  Laterality: N/A;   CARDIOVERSION N/A 02/15/2018    Procedure: CARDIOVERSION;  Surgeon: [name], MD;  Location: [place];  Service: Cardiovascular;  Laterality: N/A;   CARDIOVERSION N/A 04/10/2018   Procedure: CARDIOVERSION;  Surgeon: Josue Hector, MD;  Location: [facility] ENDOSCOPY;  Service: Cardiovascular;  Laterality: N/A;   CESAREAN SECTION     EYE SURGERY      Allergies  No Known Allergies  History of Present Illness    Courtney Pearson is a 72 y.o. female with a hx of persistent atrial fibrillation/flutter s/p ablation 12/2017 and DCCV and repeat ablation 01/12/2019, CAD, hypertension, hyperlipidemia, diabetes last seen 06/14/21  She was seen by endocrinology 03/2017 and noted to be in atrial flutter.  Followed in the atrial fibrillation clinic with echo 04/12/2017 with EF 55 to 60%, mildly elevated pulmonary pressures, small pericardial effusion with no tamponade.  Underwent cardioversion 04/2017 but had recurrent atrial fibrillation.  Event monitor 05/2017 with 50% atrial fibrillation burden as well as 3.3-second postconversion pause.  She started on flecainide.  Also been on amiodarone and failed multiple cardioversion.  Underwent atrial fibrillation ablation 12/2017.  Recurrent atrial fibrillation DCCV that was unsuccessful.  Palliation CT with calcium score 1712.  Myoview 03/2019 negative for ischemia.  She has had consultation for possible maze procedure with cardiothoracic surgery.  Referred back to the atrial fibrillation clinic and underwent repeat ablation 12/2020.  02/08/2021 beta-blocker was held for symptomatic bradycardia.  Seen 04/05/21 by Dr. Oval Linsey and due to elevated blood pressure losartan was resumed at 50 mg daily.  She had atypical chest pain with Lexiscan Myoview 04/12/2021 was low risk  study with LVEF 82%.  She saw Dr. Rayann Heman 04/14/2021 and was doing well from cardiac perspective and was recommended for EP follow-up in 6 months.  Seen 06/14/21. BP was elevated at home often >130/80.Losartan increased to '25mg'$  AM and  '50mg'$  PM. Monitor due to lightheadedness average heart rate 48 bpm (max 90 bpm and minimum 33 bpm) with up to 4.8 second post termination pauses. She saw Dr. Curt Bears 09/2021 with no changes recommended.   Last saw Dr. Oval Linsey 02/28/22 with intermittent lightheadedness. She had heart rates in the 40s but also hypotension. Losartan reduced to '25mg'$  QHS. If no improvement in symptoms, plans to rediscuss possible PPM.  She presents today for follow up. 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 Since splitting the dose of Losartan she is not certain she feels much different. She is taking at bedtime. Does note she has had fewer dizzy spells. She feels she does normal things but feels fatigued. Home BP log with average SBP 127 mmHg. Her two symptomatic episodes were with a heart rrate of 42 bpm. She is hesitant to have PPM but is interested if it may improve her symptoms. BP range at home 98/56-149/60 with readings most often 110s-120s.  EKGs/Labs/Other Studies Reviewed:   The following studies were reviewed today: Cardiac Studies & Procedures     STRESS TESTS  MYOCARDIAL PERFUSION IMAGING 04/12/2021  Narrative   The study is normal. The study is low risk.   No ST deviation was noted.   LV perfusion is normal.   Left ventricular function is normal. Nuclear stress EF: 82 %. The left ventricular ejection fraction is hyperdynamic (>65%). End diastolic cavity size is normal.   Prior study available for comparison from 04/01/2019. No changes compared to prior study.  Low risk stress nuclear study with normal perfusion and normal left ventricular regional and global systolic function.   ECHOCARDIOGRAM  ECHOCARDIOGRAM COMPLETE 08/05/2021  Narrative ECHOCARDIOGRAM REPORT    Patient Name:   Courtney Pearson Date of Exam: 08/05/2021 Medical Rec #:  [id number]        Height:       67.0 in Accession #:    [id number]       Weight:       194.4 lb Date of Birth:  11/07/1950        BSA:           1.998 m Patient Age:    31 years         BP:           150/68 mmHg Patient Gender: F                HR:           66 bpm. Exam Location:  [facility]  Procedure: 2D Echo, 3D Echo, Cardiac Doppler, Color Doppler and Strain Analysis  Indications:    I50.33 CHF  History:        Patient has prior history of Echocardiogram examinations, most recent 08/04/2020. CAD, Arrythmias:Atrial Fibrillation, Atrial Flutter and Sick sinus syndrome. Palpitation, Signs/Symptoms:Dyspnea; Risk Factors:Hypertension and Dyslipidemia.  Sonographer:    Basilia Jumbo BS, RDCS Referring Phys: [id number] TIFFANY Parker  IMPRESSIONS   1. Left ventricular ejection fraction, by estimation, is 65 to 70%. The left ventricle has normal function. The left ventricle has no regional wall motion abnormalities. Left ventricular diastolic parameters are consistent with Grade II diastolic dysfunction (pseudonormalization). Elevated left atrial pressure. The average left ventricular global longitudinal strain is -26.9 %.  2. Right ventricular systolic function is mildly reduced. The right ventricular size is normal. There is moderately elevated pulmonary artery systolic pressure. The estimated right ventricular systolic pressure is 99991111 mmHg. 3. The mitral valve is degenerative. Mild mitral valve regurgitation. Mild to moderate mitral stenosis. The mean mitral valve gradient is 5.0 mmHg. Moderate mitral annular calcification. MVA 1.4 cm^2 by continuity equation 4. The aortic valve is tricuspid. Aortic valve regurgitation is not visualized. No aortic stenosis is present. 5. The inferior vena cava is normal in size with <50% respiratory variability, suggesting right atrial pressure of 8 mmHg.  Comparison(s): 08/04/20 EF 55-60%. PA pressure 4mHg. Mild MS, 653mg mean PG, 1693m peak PG.  FINDINGS Left Ventricle: Left ventricular ejection fraction, by estimation, is 65 to 70%. The left ventricle has normal function. The left  ventricle has no regional wall motion abnormalities. The average left ventricular global longitudinal strain is -26.9 %. 3D left ventricular ejection fraction analysis performed but not reported based on interpreter judgement due to suboptimal tracking. The left ventricular internal cavity size was normal in size. There is no left ventricular hypertrophy. Left ventricular diastolic parameters are consistent with Grade II diastolic dysfunction (pseudonormalization). Elevated left atrial pressure.  Right Ventricle: The right ventricular size is normal. No increase in right ventricular wall thickness. Right ventricular systolic function is mildly reduced. There is moderately elevated pulmonary artery systolic pressure. The tricuspid regurgitant velocity is 3.20 m/s, and with an assumed right atrial pressure of 8 mmHg, the estimated right ventricular systolic pressure is 49.99991111Hg.  Left Atrium: Left atrial size was normal in size.  Right Atrium: Right atrial size was normal in size.  Pericardium: Trivial pericardial effusion is present.  Mitral Valve: The mitral valve is degenerative in appearance. Moderate mitral annular calcification. Mild mitral valve regurgitation. Mild to moderate mitral valve stenosis. MV peak gradient, 13.8 mmHg. The mean mitral valve gradient is 5.0 mmHg with average heart rate of 58 bpm.  Tricuspid Valve: The tricuspid valve is normal in structure. Tricuspid valve regurgitation is mild.  Aortic Valve: The aortic valve is tricuspid. Aortic valve regurgitation is not visualized. No aortic stenosis is present.  Pulmonic Valve: The pulmonic valve was grossly normal. Pulmonic valve regurgitation is mild.  Aorta: The aortic root and ascending aorta are structurally normal, with no evidence of dilitation.  Venous: The inferior vena cava is normal in size with less than 50% respiratory variability, suggesting right atrial pressure of 8 mmHg.  IAS/Shunts: The interatrial  septum was not well visualized.   LEFT VENTRICLE PLAX 2D LVIDd:         4.80 cm   Diastology LVIDs:         2.60 cm   LV e' medial:    4.95 cm/s LV PW:         0.90 cm   LV E/e' medial:  38.2 LV IVS:        0.90 cm   LV e' lateral:   5.91 cm/s LVOT diam:     2.00 cm   LV E/e' lateral: 32.0 LV SV:         85 LV SV Index:   42        2D Longitudinal Strain LVOT Area:     3.14 cm  2D Strain GLS (A2C):   -28.9 % 2D Strain GLS (A3C):   -26.7 % 2D Strain GLS (A4C):   -25.3 % 2D Strain GLS Avg:     -26.9 %  3D Volume EF:  3D EF:        73 % LV EDV:       110 ml LV ESV:       29 ml LV SV:        80 ml  RIGHT VENTRICLE            IVC RV Basal diam:  3.10 cm    IVC diam: 1.70 cm RV S prime:     7.83 cm/s TAPSE (M-mode): 1.6 cm RVSP:           44.0 mmHg  LEFT ATRIUM             Index        RIGHT ATRIUM           Index LA diam:        4.30 cm 2.15 cm/m   RA Pressure: 3.00 mmHg LA Vol (A2C):   75.6 ml 37.84 ml/m  RA Area:     15.90 cm LA Vol (A4C):   46.7 ml 23.37 ml/m  RA Volume:   38.70 ml  19.37 ml/m LA Biplane Vol: 59.4 ml 29.73 ml/m AORTIC VALVE LVOT Vmax:   108.00 cm/s LVOT Vmean:  73.900 cm/s LVOT VTI:    0.269 m  AORTA Ao Root diam: 3.20 cm Ao Asc diam:  3.20 cm  MITRAL VALVE                TRICUSPID VALVE TR Peak grad:   41.0 mmHg MV Area VTI:   1.40 cm     TR Vmax:        320.00 cm/s MV Peak grad:  13.8 mmHg    Estimated RAP:  3.00 mmHg MV Mean grad:  5.0 mmHg     RVSP:           44.0 mmHg MV Vmax:       1.86 m/s MV Vmean:      102.0 cm/s   SHUNTS MV Decel Time: 208 msec     Systemic VTI:  0.27 m MV E velocity: 189.00 cm/s  Systemic Diam: 2.00 cm MV A velocity: 109.00 cm/s MV E/A ratio:  1.73  Oswaldo Milian MD Electronically signed by Oswaldo Milian MD Signature Date/Time: 08/05/2021/2:45:21 PM    Final    MONITORS  LONG TERM MONITOR (3-14 DAYS) 09/21/2021  Narrative 7 Day Zio Monitor  Quality: Fair.  Baseline  artifact. Predominant rhythm: sinus rhythm Average heart rate: 48 bpm Max heart rate: 90 bpm Min heart rate: 33 bpm Pauses >2.5 seconds: Up to 4.8s post-termination pause noted overnight 6 minutes 11 seconds of atrial fibrillation Short runs of atrial fibrillation with aberrancy  Rare ventricular couplets 4 beats NSVT   Tiffany C. Oval Linsey, MD, [facility] 09/21/2021 1:59 PM             EKG:  No EKG today.   Recent Labs: 11/25/2021: BNP 179.4; BUN 39; Creatinine, Ser 1.67; Potassium 4.4; Sodium 141  Recent Lipid Panel No results found for: "CHOL", "TRIG", "HDL", "CHOLHDL", "VLDL", "LDLCALC", "LDLDIRECT"  Risk Assessment/Calculations:   CHA2DS2-VASc Score = 5  This indicates a 7.2% annual risk of stroke. The patient's score is based upon: CHF History: 1 HTN History: 1 Diabetes History: 0 Stroke History: 0 Vascular Disease History: 1 Age Score: 1 Gender Score: 1  Home Medications   Current Meds  Medication Sig   acetaminophen (TYLENOL) 500 MG tablet Take 500 mg by mouth every 8 (eight) hours as needed for moderate pain or headache.  amoxicillin (AMOXIL) 500 MG capsule Take 4 capsules by mouth See admin instructions. 1 hour prior to dental appointments   apixaban (ELIQUIS) 5 MG TABS tablet TAKE 1 TABLET TWICE DAILY   atorvastatin (LIPITOR) 10 MG tablet Take 10 mg by mouth every other day.    bismuth subsalicylate (PEPTO BISMOL) 262 MG/15ML suspension Take 30 mLs by mouth daily as needed for indigestion.   calcium carbonate (TUMS EX) 750 MG chewable tablet Chew 750 mg by mouth daily.    Cholecalciferol (VITAMIN D-3) 25 MCG (1000 UT) CAPS Take 1,000 Units by mouth daily.   fexofenadine (ALLEGRA) 180 MG tablet Take 180 mg by mouth as needed for allergies or rhinitis.   FIASP 100 UNIT/ML SOLN SMARTSIG:0-25 Unit(s) SUB-Q Daily   furosemide (LASIX) 40 MG tablet Take 1-2 tablets (40-80 mg total) by mouth See admin instructions. 80 mg on Monday, Wednesday, and Friday only Take 40 mg  all other days   hydrocortisone cream 1 % Apply 1 application topically daily as needed for itching.   levothyroxine (SYNTHROID) 75 MCG tablet 1 tablet in the morning on an empty stomach   loperamide (IMODIUM A-D) 2 MG tablet Take 2-4 mg by mouth as needed for diarrhea or loose stools.    losartan (COZAAR) 25 MG tablet Take 1 tablet (25 mg total) by mouth every evening.   Multiple Vitamins-Minerals (MULTIVITAMIN ADULT PO) Take 1 tablet by mouth once a week.    Review of Systems      All other systems reviewed and are otherwise negative except as noted above.  Physical Exam    VS:  BP 128/70   Pulse (!) 55   Ht '5\' 6"'$  (1.676 m)   Wt 190 lb (86.2 kg)   BMI 30.67 kg/m  , BMI Body mass index is 30.67 kg/m.  Wt Readings from Last 3 Encounters:  03/28/22 190 lb (86.2 kg)  02/28/22 186 lb 3.2 oz (84.5 kg)  11/16/21 189 lb (85.7 kg)    GEN: Well nourished, well developed, in no acute distress. HEENT: normal. Neck: Supple, no JVD, carotid bruits, or masses. Cardiac: RRR, no murmurs, rubs, or gallops. No clubbing, cyanosis, edema.  Radials/PT 2+ and equal bilaterally.  Respiratory:  Respirations regular and unlabored, clear to auscultation bilaterally. GI: Soft, nontender, nondistended. MS: No deformity or atrophy. Skin: Warm and dry, no rash. Neuro:  Strength and sensation are intact. Psych: Normal affect.  Assessment & Plan    HTN - BP at goal <130/80 by home monitoring. Continue Losartan '25mg'$  QHS. Still with episodes of lightheadedness despite reduced dose antihypertension. Concern related to bradycardia, plan to follow up with EP to discuss possible EP as below..   Atypical atrial flutter s/p ablation / Hypercoagulable state / Bradycardia - Follows with Dr. Curt Bears of EP. Continue Eliquis '5mg'$  BID, does not meet dose reduction criteria. CHA2DS2-VASc Score = 5 [CHF History: 1, HTN History: 1, Diabetes History: 0, Stroke History: 0, Vascular Disease History: 1, Age Score: 1, Gender  Score: 1].  Therefore, the patient's annual risk of stroke is 7.2 %.    Beta blocker previously discontinued due to bradycardia. Notes lightheadedness associated with heart rates in the 40s - plan to follow up with Dr. Curt Bears to discuss possible PPM. She plans to increase her exercise prior to see if any improvement in her symptoms.   Chronic diastolic heart failure / MItral stenosis - Euvolemic and well compensated on exam.  Continue current dose Lasix '80mg'$  and '40mg'$ . No beta blocker due to  bradycardia. Low sodium diet, fluid restriction <2L, and daily weights encouraged. Educated to contact our office for weight gain of 2 lbs overnight or 5 lbs in one week.   CAD - Cardiac CT 12/2020 coronary calcium score 3153. Lexiscan 03/2021 low risk study. Stable with no anginal symptoms. No indication for ischemic evaluation.  GDMT includes Atorvastatin. No aspirin due to chronic anticoagulation. No Metoprolol due to previous bradycardia. Heart healthy diet and regular cardiovascular exercise encouraged.    DM2 - Continue to follow with PCP.   HLD, LDL goal <70 - 05/2021 LDL 65. Continue Atorvastatin '10mg'$  QD.   Hypothyroidism - Continue to follow with PCP.   Disposition: Follow up  as scheduled   with [name], MD or APP.  Signed, Loel Dubonnet, NP 03/28/2022, 11:09 AM [place]

## 2022-03-29 ENCOUNTER — Encounter (HOSPITAL_BASED_OUTPATIENT_CLINIC_OR_DEPARTMENT_OTHER): Payer: Self-pay | Admitting: Family

## 2022-03-29 ENCOUNTER — Telehealth: Payer: Self-pay

## 2022-03-29 NOTE — Patient Outreach (Signed)
  Care Coordination   Initial Visit Note   03/29/2022 Name: Courtney Pearson MRN: [id number] DOB: 06/09/1950  Courtney Pearson is a 72 y.o. year old female who sees Jonathon Jordan, MD for primary care. I spoke with  Courtney Pearson by phone today.  What matters to the patients health and wellness today?  No concerns today. States she sees her endocrinologist every 3 months and she is keeping her Type 1 diabetes under good control.  No further interventions needed   Goals Addressed             This Visit's Progress    COMPLETED: Care Coordination Activities - no follow up required       Interventions Today    Flowsheet Row Most Recent Value  Chronic Disease   Chronic disease during today's visit Diabetes  General Interventions   General Interventions Discussed/Reviewed General Interventions Discussed, Annual Eye Exam, Annual Foot Exam, Durable Medical Equipment (DME), Doctor Visits  Doctor Visits Discussed/Reviewed Doctor Visits Discussed, Annual Wellness Visits  Durable Medical Equipment (DME) Insulin Pump, Other  [CGM]  Education Interventions   Education Provided Provided Education  Provided Verbal Education On Other  [care coordination services]              SDOH assessments and interventions completed:  Yes  SDOH Interventions Today    Flowsheet Row Most Recent Value  SDOH Interventions   Food Insecurity Interventions Intervention Not Indicated  Housing Interventions Intervention Not Indicated  Transportation Interventions Intervention Not Indicated  Utilities Interventions Intervention Not Indicated        Care Coordination Interventions:  Yes, provided   Follow up plan: No further intervention required.   Encounter Outcome:  Pt. Visit Completed  Peter Garter RN, BSN,CCM, CDE Care Management Coordinator [facility] [phone]

## 2022-03-29 NOTE — Patient Instructions (Signed)
Visit Information  Thank you for taking time to visit with me today. Please don't hesitate to contact me if I can be of assistance to you.   Following are the goals we discussed today:   Goals Addressed             This Visit's Progress    COMPLETED: Care Coordination Activities - no follow up required       Interventions Today    Flowsheet Row Most Recent Value  Chronic Disease   Chronic disease during today's visit Diabetes  General Interventions   General Interventions Discussed/Reviewed General Interventions Discussed, Annual Eye Exam, Annual Foot Exam, Durable Medical Equipment (DME), Doctor Visits  Doctor Visits Discussed/Reviewed Doctor Visits Discussed, Annual Wellness Visits  Durable Medical Equipment (DME) Insulin Pump, Other  [CGM]  Education Interventions   Education Provided Provided Education  Provided Verbal Education On Other  [care coordination services]               If you are experiencing a Mental Health or [place] or need someone to talk to, please call the Suicide and Crisis Lifeline: 988 call the [place] National Suicide Prevention Lifeline: 434-151-6679 or TTY: (401) 588-9180 [place] 3127080033) to talk to a trained counselor call 1-800-273-TALK (toll free, 24 hour hotline) go to [facility] [place] [phone]) call 911   Patient verbalizes understanding of instructions and care plan provided today and agrees to view in [place]. Active MyChart status and patient understanding of how to access instructions and care plan via MyChart confirmed with patient.     Follow up with provider re:    Ara Kussmaul RN, BSN,CCM, CDE Care Management Coordinator [facility] [phone]

## 2022-03-31 ENCOUNTER — Ambulatory Visit: Payer: Medicare Other | Admitting: Podiatry

## 2022-04-04 DIAGNOSIS — R809 Proteinuria, unspecified: Secondary | ICD-10-CM | POA: Diagnosis not present

## 2022-04-04 DIAGNOSIS — I10 Essential (primary) hypertension: Secondary | ICD-10-CM | POA: Diagnosis not present

## 2022-04-04 DIAGNOSIS — E1029 Type 1 diabetes mellitus with other diabetic kidney complication: Secondary | ICD-10-CM | POA: Diagnosis not present

## 2022-04-04 DIAGNOSIS — N1832 Chronic kidney disease, stage 3b: Secondary | ICD-10-CM | POA: Diagnosis not present

## 2022-05-01 ENCOUNTER — Telehealth: Payer: Self-pay

## 2022-05-01 NOTE — Telephone Encounter (Signed)
She called me, wants to enroll in PREP, referral from provider in 2023 and 2024; was enrolled in 2021, but interrupted by COVID; she would like to attend May 14 class at [facility], every T/Th 2-3:15; will contact end of April/first of May to set up assessment visit.

## 2022-05-04 ENCOUNTER — Ambulatory Visit: Payer: Medicare Other | Admitting: Cardiology

## 2022-05-18 DIAGNOSIS — E119 Type 2 diabetes mellitus without complications: Secondary | ICD-10-CM | POA: Diagnosis not present

## 2022-05-26 ENCOUNTER — Telehealth: Payer: Self-pay

## 2022-05-26 NOTE — Telephone Encounter (Signed)
Called to confirm participation in next PREP class at [facility] on May 14; left voicemail requesting return call

## 2022-05-29 ENCOUNTER — Telehealth: Payer: Self-pay

## 2022-05-29 NOTE — Telephone Encounter (Signed)
Returned her call, wants to attend next PREP class at [facility] on May 14, assessment visit scheduled for 5/8 at 1pm.

## 2022-05-30 DIAGNOSIS — N1832 Chronic kidney disease, stage 3b: Secondary | ICD-10-CM | POA: Diagnosis not present

## 2022-05-30 DIAGNOSIS — E1042 Type 1 diabetes mellitus with diabetic polyneuropathy: Secondary | ICD-10-CM | POA: Diagnosis not present

## 2022-05-30 DIAGNOSIS — E039 Hypothyroidism, unspecified: Secondary | ICD-10-CM | POA: Diagnosis not present

## 2022-05-30 DIAGNOSIS — E1022 Type 1 diabetes mellitus with diabetic chronic kidney disease: Secondary | ICD-10-CM | POA: Diagnosis not present

## 2022-05-31 NOTE — Progress Notes (Signed)
[facility] PREP Evaluation  Patient Details  Name: Courtney Pearson MRN: [id number] Date of Birth: April 20, 1950 Age: 72 y.o. PCP: Mila Palmer, MD  Vitals:   05/31/22 1329  BP: (!) 152/60  Pulse: 70  SpO2: 98%  Weight: 190 lb 6.4 oz (86.4 kg)     [facility] Eval - 05/31/22 1300       [facility] "PREP" Location   [facility] "PREP" Location [facility]      Referral    Referring Provider Walker    Reason for referral Diabetes;Hypertension;Inactivity;Obesitity/Overweight;Heart Failure    Program Start Date 06/06/22      Measurement   Waist Circumference 35 inches    Hip Circumference 45.5 inches    Body fat 42.7 percent      Information for Trainer   Goals --   Increase stamina, stength training and improve balance   Current Exercise none    Pertinent Medical History --   Afib/flutter, CHF, HTN, diabetes     Mobility and Daily Activities   I find it easy to walk up or down two or more flights of stairs. 1    I have no trouble taking out the trash. 2    I do housework such as vacuuming and dusting on my own without difficulty. 2    I can easily lift a gallon of milk (8lbs). 4    I can easily walk a mile. 1    I have no trouble reaching into high cupboards or reaching down to pick up something from the floor. 2    I do not have trouble doing out-door work such as Loss adjuster, chartered, raking leaves, or gardening. 2      Mobility and Daily Activities   I feel younger than my age. 1    I feel independent. 4    I feel energetic. 1    I live an active life.  2    I feel strong. 2    I feel healthy. 1    I feel active as other people my age. 2      How fit and strong are you.   Fit and Strong Total Score 27            Past Medical History:  Diagnosis Date   Acute on chronic diastolic heart failure (HCC) 03/12/2020   Atypical atrial flutter (HCC) 03/12/2020   CAD in native artery 03/20/2019   Chronic diastolic heart failure (HCC) 03/12/2020   Diabetes (HCC)    HTN (hypertension)     Hyperlipidemia    Hypothyroidism    Mitral stenosis 11/16/2021   Palpitation    Persistent atrial fibrillation (HCC) 04/03/2017   Renal insufficiency    Sick sinus syndrome (HCC) 07/06/2020   Weakness 10/06/2020   Past Surgical History:  Procedure Laterality Date   ATRIAL FIBRILLATION ABLATION N/A 01/01/2018   Procedure: ATRIAL FIBRILLATION ABLATION;  Surgeon: Hillis Range, MD;  [facility];  Service: Cardiovascular;  Laterality: N/A;   ATRIAL FIBRILLATION ABLATION N/A 10/31/2018   Procedure: ATRIAL FIBRILLATION ABLATION;  Surgeon: Hillis Range, MD;  [facility];  Service: Cardiovascular;  Laterality: N/A;   ATRIAL FIBRILLATION ABLATION N/A 01/06/2021   Procedure: ATRIAL FIBRILLATION ABLATION;  Surgeon: Hillis Range, MD;  [facility];  Service: Cardiovascular;  Laterality: N/A;   BREAST BIOPSY     BREAST EXCISIONAL BIOPSY Right 1993   CARDIOVERSION N/A 04/30/2017   Procedure: CARDIOVERSION;  Surgeon: Chilton Si, MD;  [facility]: [facility];  Service: Cardiovascular;  Laterality: N/A;   CARDIOVERSION N/A 02/15/2018   Procedure: CARDIOVERSION;  Surgeon: Chilton Si, MD;  Location: [facility] ENDOSCOPY;  Service: Cardiovascular;  Laterality: N/A;   CARDIOVERSION N/A 04/10/2018   Procedure: CARDIOVERSION;  Surgeon: Wendall Stade, MD;  Location: [facility] ENDOSCOPY;  Service: Cardiovascular;  Laterality: N/A;   CESAREAN SECTION     EYE SURGERY     Social History   Tobacco Use  Smoking Status Never  Smokeless Tobacco Never  To begin PREP class at [facility] on May 14, every T/Th 2-3:15  Donterius Filley B Ameshia Pewitt 05/31/2022, 1:32 PM

## 2022-06-06 DIAGNOSIS — N1832 Chronic kidney disease, stage 3b: Secondary | ICD-10-CM | POA: Diagnosis not present

## 2022-06-06 DIAGNOSIS — E039 Hypothyroidism, unspecified: Secondary | ICD-10-CM | POA: Diagnosis not present

## 2022-06-06 DIAGNOSIS — E1022 Type 1 diabetes mellitus with diabetic chronic kidney disease: Secondary | ICD-10-CM | POA: Diagnosis not present

## 2022-06-06 DIAGNOSIS — E11319 Type 2 diabetes mellitus with unspecified diabetic retinopathy without macular edema: Secondary | ICD-10-CM | POA: Diagnosis not present

## 2022-06-06 DIAGNOSIS — I10 Essential (primary) hypertension: Secondary | ICD-10-CM | POA: Diagnosis not present

## 2022-06-06 DIAGNOSIS — E1042 Type 1 diabetes mellitus with diabetic polyneuropathy: Secondary | ICD-10-CM | POA: Diagnosis not present

## 2022-06-06 DIAGNOSIS — Z794 Long term (current) use of insulin: Secondary | ICD-10-CM | POA: Diagnosis not present

## 2022-06-06 NOTE — Progress Notes (Signed)
[facility] PREP Weekly Session  Patient Details  Name: Courtney Pearson MRN: [id number] Date of Birth: 08-05-1950 Age: 72 y.o. PCP: Mila Palmer, MD  There were no vitals filed for this visit.   [facility] Weekly seesion - 06/06/22 1500       [facility] "PREP" Location   [facility]PREP[facility] Location [facility]      Weekly Session   Topic Discussed Goal setting and welcome to the program   Introductions, reveiw of notebook, tour of facility   Classes attended to date 1             Sonia Baller 06/06/2022, 3:43 PM

## 2022-06-07 ENCOUNTER — Encounter (INDEPENDENT_AMBULATORY_CARE_PROVIDER_SITE_OTHER): Payer: Medicare Other | Admitting: Ophthalmology

## 2022-06-07 DIAGNOSIS — H35372 Puckering of macula, left eye: Secondary | ICD-10-CM

## 2022-06-07 DIAGNOSIS — E103512 Type 1 diabetes mellitus with proliferative diabetic retinopathy with macular edema, left eye: Secondary | ICD-10-CM

## 2022-06-07 DIAGNOSIS — H43813 Vitreous degeneration, bilateral: Secondary | ICD-10-CM | POA: Diagnosis not present

## 2022-06-07 DIAGNOSIS — I10 Essential (primary) hypertension: Secondary | ICD-10-CM | POA: Diagnosis not present

## 2022-06-07 DIAGNOSIS — E103591 Type 1 diabetes mellitus with proliferative diabetic retinopathy without macular edema, right eye: Secondary | ICD-10-CM | POA: Diagnosis not present

## 2022-06-07 DIAGNOSIS — H35033 Hypertensive retinopathy, bilateral: Secondary | ICD-10-CM

## 2022-06-07 DIAGNOSIS — Z794 Long term (current) use of insulin: Secondary | ICD-10-CM | POA: Diagnosis not present

## 2022-06-13 NOTE — Progress Notes (Signed)
[facility] PREP Weekly Session  Patient Details  Name: Courtney Pearson MRN: [id number] Date of Birth: [birth year] Age: 72 y.o. PCP: Mila Palmer, MD  Vitals:   06/13/22 1546  Weight: 187 lb 3.2 oz (84.9 kg)     [facility] Weekly seesion - 06/13/22 1500       [facility] "PREP" Location   [facility]PREP[facility] Location [facility]      Weekly Session   Topic Discussed Importance of resistance training;Other ways to be active   Cardio: work up to 150 min/wk  Strength training: 2-3 times/wk for 20-40 minutes   Minutes exercised this week 25 minutes    Classes attended to date 3             Sonia Baller 06/13/2022, 3:46 PM

## 2022-06-21 NOTE — Progress Notes (Signed)
[facility] PREP Weekly Session  Patient Details  Name: Courtney Pearson MRN: [id number] Date of Birth: 1950/10/09 Age: 72 y.o. PCP: Mila Palmer, MD  Vitals:   06/20/22 1530  Weight: 189 lb (85.7 kg)     [facility] Weekly seesion - 06/21/22 1200       [facility] "PREP" Location   [facility]PREP[facility] Location [facility]      Weekly Session   Topic Discussed Healthy eating tips   introduced YUKA app, foods to reduce, foods to increase; eat the rainbow of colors   Minutes exercised this week 70 minutes    Classes attended to date 5             Courtney Pearson Courtney Pearson 06/21/2022, 12:12 PM

## 2022-06-27 NOTE — Progress Notes (Signed)
[facility] PREP Weekly Session  Patient Details  Name: Courtney Pearson MRN: [id number] Date of Birth: July 23, 1950 Age: 72 y.o. PCP: Mila Palmer, MD  Vitals:   06/27/22 1539  Weight: 190 lb 3.2 oz (86.3 kg)     [facility] Weekly seesion - 06/27/22 1500       [facility] "PREP" Location   [facility]PREP[facility] Location [facility]      Weekly Session   Topic Discussed Health habits   sugar demo   Minutes exercised this week 55 minutes    Classes attended to date 7             Sonia Baller 06/27/2022, 3:39 PM

## 2022-07-04 NOTE — Progress Notes (Signed)
[facility] PREP Weekly Session  Patient Details  Name: Courtney Pearson MRN: [id number] Date of Birth: 1950-07-07 Age: 72 y.o. PCP: Mila Palmer, MD  Vitals:   07/04/22 1614  Weight: 188 lb 9.6 oz (85.5 kg)     [facility] Weekly seesion - 07/04/22 1600       [facility] "PREP" Location   [facility]PREP[facility] Location [facility]      Weekly Session   Topic Discussed Restaurant Eating   limit Na intake to 1500-2300 mg/day; salt demo   Minutes exercised this week 90 minutes    Classes attended to date 67             Courtney Pearson 07/04/2022, 4:15 PM

## 2022-07-11 NOTE — Progress Notes (Signed)
[facility] PREP Weekly Session  Patient Details  Name: Courtney Pearson MRN: [id number] Date of Birth: December 03, 1950 Age: 72 y.o. PCP: Mila Palmer, MD  Vitals:   07/11/22 1512  Weight: 186 lb 9.6 oz (84.6 kg)     [facility] Weekly seesion - 07/11/22 1500       [facility] "PREP" Location   [facility] "PREP[facility] Location [facility]      Weekly Session   Topic Discussed Stress management and problem solving   importance of sleep 7-9 hrs/night; finger tip mudra breathwork   Minutes exercised this week 110 minutes    Classes attended to date 19             Mieke Brinley B Janani Chamber 07/11/2022, 3:13 PM

## 2022-07-17 DIAGNOSIS — Z79899 Other long term (current) drug therapy: Secondary | ICD-10-CM | POA: Diagnosis not present

## 2022-07-17 DIAGNOSIS — D631 Anemia in chronic kidney disease: Secondary | ICD-10-CM | POA: Diagnosis not present

## 2022-07-17 DIAGNOSIS — N1832 Chronic kidney disease, stage 3b: Secondary | ICD-10-CM | POA: Diagnosis not present

## 2022-07-17 DIAGNOSIS — E1042 Type 1 diabetes mellitus with diabetic polyneuropathy: Secondary | ICD-10-CM | POA: Diagnosis not present

## 2022-07-18 NOTE — Progress Notes (Signed)
[facility] PREP Weekly Session  Patient Details  Name: Courtney Pearson MRN: [id number] Date of Birth: [birth year] Age: 72 y.o. PCP: Mila Palmer, MD  Vitals:   07/18/22 1456  Weight: 188 lb 3.2 oz (85.4 kg)     [facility] Weekly seesion - 07/18/22 1400       [facility] "PREP" Location   [facility] "PREP[facility] Location [facility]      Weekly Session   Topic Discussed Expectations and non-scale victories   Halfway through program, time to revisit/restate/refocus goals; Staying positive, recognize non scale victories   Minutes exercised this week 110 minutes    Classes attended to date 22             Beola Vasallo B Mana Haberl 07/18/2022, 2:56 PM

## 2022-07-19 DIAGNOSIS — N1832 Chronic kidney disease, stage 3b: Secondary | ICD-10-CM | POA: Diagnosis not present

## 2022-07-19 DIAGNOSIS — D61818 Other pancytopenia: Secondary | ICD-10-CM | POA: Diagnosis not present

## 2022-07-19 DIAGNOSIS — D6869 Other thrombophilia: Secondary | ICD-10-CM | POA: Diagnosis not present

## 2022-07-19 DIAGNOSIS — Z79899 Other long term (current) drug therapy: Secondary | ICD-10-CM | POA: Diagnosis not present

## 2022-07-19 DIAGNOSIS — I5032 Chronic diastolic (congestive) heart failure: Secondary | ICD-10-CM | POA: Diagnosis not present

## 2022-07-19 DIAGNOSIS — I480 Paroxysmal atrial fibrillation: Secondary | ICD-10-CM | POA: Diagnosis not present

## 2022-07-19 DIAGNOSIS — E1042 Type 1 diabetes mellitus with diabetic polyneuropathy: Secondary | ICD-10-CM | POA: Diagnosis not present

## 2022-07-19 DIAGNOSIS — I700 Atherosclerosis of aorta: Secondary | ICD-10-CM | POA: Diagnosis not present

## 2022-07-19 DIAGNOSIS — E1022 Type 1 diabetes mellitus with diabetic chronic kidney disease: Secondary | ICD-10-CM | POA: Diagnosis not present

## 2022-07-19 DIAGNOSIS — Z0000 Encounter for general adult medical examination without abnormal findings: Secondary | ICD-10-CM | POA: Diagnosis not present

## 2022-07-21 ENCOUNTER — Encounter (HOSPITAL_BASED_OUTPATIENT_CLINIC_OR_DEPARTMENT_OTHER): Payer: Self-pay

## 2022-07-25 NOTE — Progress Notes (Signed)
[facility] PREP Weekly Session  Patient Details  Name: GLENDELL GREB MRN: [id number] Date of Birth: 06/09/1950 Age: 72 y.o. PCP: Mila Palmer, MD  Vitals:   07/25/22 1516  Weight: 188 lb 3.2 oz (85.4 kg)     [facility] Weekly seesion - 07/25/22 1500       [facility] "PREP" Location   [facility] "PREP[facility] Location [facility]      Weekly Session   Minutes exercised this week 120 minutes    Classes attended to date 46             Alexandr Oehler B Omolola Mittman 07/25/2022, 3:16 PM

## 2022-08-01 NOTE — Progress Notes (Signed)
[facility] PREP Weekly Session  Patient Details  Name: Courtney Pearson MRN: [id number] Date of Birth: 1950-05-13 Age: 72 y.o. PCP: Mila Palmer, MD  Vitals:   08/01/22 1550  Weight: 187 lb 9.6 oz (85.1 kg)     [facility] Weekly seesion - 08/01/22 1500       [facility] "PREP" Location   [facility]PREP[facility] Location [facility]      Weekly Session   Topic Discussed Finding support   Review of food labels/ingredients of foods brought in from home   Minutes exercised this week 75 minutes    Classes attended to date 60             Taytum Scheck B Berklie Dethlefs 08/01/2022, 3:51 PM

## 2022-08-04 ENCOUNTER — Ambulatory Visit (INDEPENDENT_AMBULATORY_CARE_PROVIDER_SITE_OTHER): Payer: Medicare Other | Admitting: Podiatry

## 2022-08-04 DIAGNOSIS — B351 Tinea unguium: Secondary | ICD-10-CM | POA: Diagnosis not present

## 2022-08-04 DIAGNOSIS — Z7901 Long term (current) use of anticoagulants: Secondary | ICD-10-CM | POA: Diagnosis not present

## 2022-08-04 DIAGNOSIS — M79674 Pain in right toe(s): Secondary | ICD-10-CM | POA: Diagnosis not present

## 2022-08-04 DIAGNOSIS — M79675 Pain in left toe(s): Secondary | ICD-10-CM | POA: Diagnosis not present

## 2022-08-04 NOTE — Progress Notes (Unsigned)
Subjective: Chief Complaint  Patient presents with   Ingrown Toenail    Ingrown to bilateral hallux-lateral borders. Patient is diabetic.    No drainage or pus, just sore.  Last A1c was 7.2 she thinks.    Objective: AAO x3, NAD DP/PT pulses palpable bilaterally, CRT less than 3 seconds Protective sensation intact with Simms Weinstein monofilament, vibratory sensation intact, Achilles tendon reflex intact No areas of pinpoint bony tenderness or pain with vibratory sensation. MMT 5/5, ROM WNL. No edema, erythema, increase in warmth to bilateral lower extremities.  No open lesions or pre-ulcerative lesions.  No pain with calf compression, swelling, warmth, erythema  Assessment:  Plan: -All treatment options discussed with the patient including all alternatives, risks, complications.   -Patient encouraged to call the office with any questions, concerns, change in symptoms.

## 2022-08-08 NOTE — Progress Notes (Signed)
[facility] PREP Weekly Session  Patient Details  Name: Courtney Pearson MRN: [id number] Date of Birth: 1950-01-27 Age: 72 y.o. PCP: Mila Palmer, MD  Vitals:   08/08/22 1527  Weight: 186 lb 12.8 oz (84.7 kg)     [facility] Weekly seesion - 08/08/22 1500       [facility] "PREP" Location   [facility] "PREP" Location [facility]      Weekly Session   Topic Discussed Calorie breakdown   Simple vs. Complex carbs, fats, proteins---USDA recommended daily intakes; supplements, membership talk w/[facility]   Minutes exercised this week 100 minutes    Classes attended to date 17             Habiba Treloar B Irma Roulhac 08/08/2022, 3:28 PM

## 2022-08-15 NOTE — Progress Notes (Signed)
[facility] PREP Weekly Session  Patient Details  Name: Courtney Pearson MRN: [id number] Date of Birth: 1950/02/21 Age: 72 y.o. PCP: Mila Palmer, MD  Vitals:   08/15/22 1534  Weight: 186 lb 6.4 oz (84.6 kg)     [facility] Weekly seesion - 08/15/22 1500       [facility] "PREP" Location   [facility] "PREP" Location [facility]      Weekly Session   Topic Discussed Hitting roadblocks   Review of PREP Rx for sugar, salt, exercise/strength training, fiber, sleep, water; discussed completing goals and activity plan and PREP surbery to bring to final assessment visit next week.   Minutes exercised this week 120 minutes    Classes attended to date 36             Sonia Baller 08/15/2022, 3:35 PM

## 2022-08-22 NOTE — Progress Notes (Signed)
[facility] PREP Weekly Session  Patient Details  Name: BAHAR TURTURRO MRN: [id number] Date of Birth: [birth year] Age: 72 y.o. PCP: Mila Palmer, MD  Wt: 186.4  Fit testing completed; final assessment visit scheduled; review of goals and activity plan for next 90 days and PREP survey--to complete both and bring to final assessment visit; how fit and strong survey completed.     Sonia Baller 08/22/2022, 3:29 PM

## 2022-08-23 ENCOUNTER — Telehealth (HOSPITAL_BASED_OUTPATIENT_CLINIC_OR_DEPARTMENT_OTHER): Payer: Self-pay | Admitting: *Deleted

## 2022-08-23 NOTE — Telephone Encounter (Signed)
Received call from [facility]. RN with PREP program at [facility]  Patient at [facility] today not feeling well and thought patient may need to be seen Called and spoke with patient who has been feeling very tired and overall not good She did find out her blood sugar was low today  Advised patient if she is feeling better tomorrow she can call and cancel appointment tomorrow but keep visit next week as scheduled

## 2022-08-23 NOTE — Progress Notes (Signed)
[facility] PREP Evaluation  Patient Details  Name: Courtney Pearson MRN: [id number] Date of Birth: Nov 10, 1950 Age: 72 y.o. PCP: Mila Palmer, MD  Vitals:   08/23/22 1521  BP: (!) 142/64  Pulse: (!) 49  SpO2: 99%  Weight: 186 lb 6.4 oz (84.6 kg)     [facility] Eval - 08/23/22 1500       [facility] "PREP" Location   [facility] "PREP" Location [facility]      Referral    Program Start Date 06/06/22    Program End Date 08/23/22      Measurement   Waist Circumference 35 inches    Waist Circumference End Program 34 inches    Hip Circumference 43.5 inches    Hip Circumference End Program 43.5 inches    Body fat 42.3 percent      Mobility and Daily Activities   I find it easy to walk up or down two or more flights of stairs. 1    I have no trouble taking out the trash. 3    I do housework such as vacuuming and dusting on my own without difficulty. 3    I can easily lift a gallon of milk (8lbs). 3    I can easily walk a mile. 1    I have no trouble reaching into high cupboards or reaching down to pick up something from the floor. 3    I do not have trouble doing out-door work such as Loss adjuster, chartered, raking leaves, or gardening. 2      Mobility and Daily Activities   I feel younger than my age. 2    I feel independent. 4    I feel energetic. 2    I live an active life.  2    I feel strong. 3    I feel healthy. 2    I feel active as other people my age. 3      How fit and strong are you.   Fit and Strong Total Score 34            Past Medical History:  Diagnosis Date   Acute on chronic diastolic heart failure (HCC) 03/12/2020   Atypical atrial flutter (HCC) 03/12/2020   CAD in native artery 03/20/2019   Chronic diastolic heart failure (HCC) 03/12/2020   Diabetes (HCC)    HTN (hypertension)    Hyperlipidemia    Hypothyroidism    Mitral stenosis 11/16/2021   Palpitation    Persistent atrial fibrillation (HCC) 04/03/2017   Renal insufficiency    Sick sinus syndrome (HCC)  07/06/2020   Weakness 10/06/2020   Past Surgical History:  Procedure Laterality Date   ATRIAL FIBRILLATION ABLATION N/A 01/01/2018   Procedure: ATRIAL FIBRILLATION ABLATION;  Surgeon: Hillis Range, MD;  [facility];  Service: Cardiovascular;  Laterality: N/A;   ATRIAL FIBRILLATION ABLATION N/A 10/31/2018   Procedure: ATRIAL FIBRILLATION ABLATION;  Surgeon: Hillis Range, MD;  [facility];  Service: Cardiovascular;  Laterality: N/A;   ATRIAL FIBRILLATION ABLATION N/A 01/06/2021   Procedure: ATRIAL FIBRILLATION ABLATION;  Surgeon: Hillis Range, MD;  [facility];  Service: Cardiovascular;  Laterality: N/A;   BREAST BIOPSY     BREAST EXCISIONAL BIOPSY Right 1993   CARDIOVERSION N/A 04/30/2017   Procedure: CARDIOVERSION;  Surgeon: Chilton Si, MD;  Location: [facility] ENDOSCOPY;  Service: Cardiovascular;  Laterality: N/A;   CARDIOVERSION N/A 02/15/2018   Procedure: CARDIOVERSION;  Surgeon: Chilton Si, MD;  Location:  [facility]OS[facility];  Service: Cardiovascular;  Laterality: N/A;   CARDIOVERSION N/A 04/10/2018   Procedure: CARDIOVERSION;  Surgeon: Wendall Stade, MD;  Location: [facility]OS[facility];  Service: Cardiovascular;  Laterality: N/A;   CESAREAN SECTION     EYE SURGERY     Social History   Tobacco Use  Smoking Status Never  Smokeless Tobacco Never  Upon arrival today, she reports feeling fatigued, washed out, low blood sugar; CBG: 64, has had some juice w/sugar; HR is slow, irreg, hx of AFIB, s/p ablations X2; to see both Dr. Duke Salvia and Dr. Gershon Crane PA next week; have sent message to Regis Bill and Gillian Shields, will be seen in office tomorrow at 10am;  How fit and strong survey: 06/06/22: 26 08/23/22: 34 Wt loss: 4 pounds Inches lost: 1 inch Education sessions completed: 12 Workout sessions completed: 11  Velencia Lenart B Epiphany Seltzer 08/23/2022, 3:25 PM

## 2022-08-24 ENCOUNTER — Ambulatory Visit (HOSPITAL_BASED_OUTPATIENT_CLINIC_OR_DEPARTMENT_OTHER): Payer: Medicare Other | Admitting: Family

## 2022-08-29 ENCOUNTER — Telehealth (HOSPITAL_BASED_OUTPATIENT_CLINIC_OR_DEPARTMENT_OTHER): Payer: Self-pay | Admitting: *Deleted

## 2022-08-29 ENCOUNTER — Encounter (HOSPITAL_BASED_OUTPATIENT_CLINIC_OR_DEPARTMENT_OTHER): Payer: Self-pay | Admitting: Cardiovascular Disease

## 2022-08-29 ENCOUNTER — Ambulatory Visit (INDEPENDENT_AMBULATORY_CARE_PROVIDER_SITE_OTHER): Payer: Medicare Other | Admitting: Cardiovascular Disease

## 2022-08-29 VITALS — BP 164/62 | HR 57 | Ht 66.0 in | Wt 184.7 lb

## 2022-08-29 DIAGNOSIS — E1022 Type 1 diabetes mellitus with diabetic chronic kidney disease: Secondary | ICD-10-CM

## 2022-08-29 DIAGNOSIS — E039 Hypothyroidism, unspecified: Secondary | ICD-10-CM | POA: Insufficient documentation

## 2022-08-29 DIAGNOSIS — N189 Chronic kidney disease, unspecified: Secondary | ICD-10-CM

## 2022-08-29 DIAGNOSIS — I5032 Chronic diastolic (congestive) heart failure: Secondary | ICD-10-CM

## 2022-08-29 DIAGNOSIS — I4819 Other persistent atrial fibrillation: Secondary | ICD-10-CM | POA: Diagnosis not present

## 2022-08-29 DIAGNOSIS — I480 Paroxysmal atrial fibrillation: Secondary | ICD-10-CM

## 2022-08-29 DIAGNOSIS — Z862 Personal history of diseases of the blood and blood-forming organs and certain disorders involving the immune mechanism: Secondary | ICD-10-CM | POA: Diagnosis not present

## 2022-08-29 DIAGNOSIS — E781 Pure hyperglyceridemia: Secondary | ICD-10-CM | POA: Diagnosis not present

## 2022-08-29 DIAGNOSIS — I10 Essential (primary) hypertension: Secondary | ICD-10-CM

## 2022-08-29 DIAGNOSIS — N1832 Chronic kidney disease, stage 3b: Secondary | ICD-10-CM | POA: Diagnosis not present

## 2022-08-29 DIAGNOSIS — Z794 Long term (current) use of insulin: Secondary | ICD-10-CM

## 2022-08-29 DIAGNOSIS — I2510 Atherosclerotic heart disease of native coronary artery without angina pectoris: Secondary | ICD-10-CM | POA: Diagnosis not present

## 2022-08-29 DIAGNOSIS — D61818 Other pancytopenia: Secondary | ICD-10-CM

## 2022-08-29 DIAGNOSIS — E109 Type 1 diabetes mellitus without complications: Secondary | ICD-10-CM

## 2022-08-29 HISTORY — DX: Other pancytopenia: D61.818

## 2022-08-29 HISTORY — DX: Chronic kidney disease, unspecified: N18.9

## 2022-08-29 MED ORDER — DAPAGLIFLOZIN PROPANEDIOL 10 MG PO TABS: 10.0000 mg | ORAL_TABLET | Freq: Every day | ORAL | Status: DC

## 2022-08-29 MED ORDER — DAPAGLIFLOZIN PROPANEDIOL 10 MG PO TABS: 10.0000 mg | ORAL_TABLET | Freq: Every day | ORAL | 5 refills | Status: DC

## 2022-08-29 NOTE — Progress Notes (Signed)
Cardiology Office Note:  .    Date:  08/29/2022  ID:  Courtney Pearson, DOB 1950/11/05, MRN [id number] PCP: Mila Palmer, MD  [facility] Providers Cardiologist:  Chilton Si, MD Electrophysiologist:  Will Jorja Loa, MD     History of Present Illness: .    Courtney Pearson is a 72 y.o. female with persistent atrial fibrillation/flutter s/p ablation 12/2017 and DCCV, coronary calcification, hypertension, hyperlipidemia, and diabetes, here for follow up.  Courtney Pearson saw her endocrinologist 03/2017 and was noted to be in atrial flutter with ventricular rates in the 120s-140s.  Propranolol was switched to metoprolol and she was started on Eliquis.  She subsequently followed up in atrial fibrillation clinic and had an echo 04/12/2017 that revealed LVEF 55 to 60% with mildly elevated pulmonary pressures.  She was noted to have a small pericardial effusion but no evidence of tamponade.  She underwent cardioversion 04/2017 but had recurrent atrial for relation.  She wore an event monitor 05/2017 that showed she was in atrial fibrillation 50% of the time.  She also had a 3.3-second postconversion pause.  She was subsequently started on flecainide.  She has also been on amiodarone and failed multiple cardioversion.  She underwent atrial fibrillation ablation 12/2017.  She had recurrent atrial fibrillation and DCCV that was unsuccessful.  She had a consultation with Dr. Vickey Sages and is considering surgical ablation.  Prior to her ablation she had a preablation CT that had a calcium score 1712.  She had a [employer] 03/2019 that was negative for ischemia.    Courtney Pearson was referred back to atrial fibrillation clinic and was advised that Tikosyn was an option.  She continues to have frequent atrial fibrillation. She was volume overloaded and advised to take extra Lasix for 3 days. She called our office 05/2020 and her heart rate was in the 30s. She was advised to stop taking her metoprolol.   She brought strips from her [employer] mobile device that showed that she was in sinus bradycardia in the 30s.  It was one of the episodes with a postconversion pause but she remained in sinus bradycardia.    Her heart rates remained poorly controlled in Afib. She was considering the MAZE procedure. She was also having bradycardia at home. She wore a 14-day Zio monitor that showed an average heart rate of 92 bpm with a minimum of 47 bpm and a 3.2 second pause.    She complained of weakness and continued to be in atrial flutter. Rate control was difficult to to sick sinus syndrome. Her first ablation was ineffective and she was considering the MAZE procedure or repeat ablation. She was referred to the PREP program. Calcium score was 3153 which was 99th percentile for age-, race-, and sex-matched controls. She saw Dr. Johney Frame 12/2020 for repeat ablation. She saw Courtney Pearson in the Afib clinic 02/08/21 and was doing well. Her beta-blocker was stopped for symptomatic bradycardia. She presented to the ED 01/2021 for antiviral therapy after a positive COVID home test. She was given molnupiravir and Tessalon Perles.   She stopped metoprolol due to bradycardia. She had been exercising regularly but did have some exertional dyspnea. She had a pre-procedural CT showed a calcium score in the 99th percentile, and a nuclear stress test 03/2021 that was low-risk. She saw Gillian Shields, NP 05/2021 and losartan was increased to 25 mg in the morning and 50 mg at night. At follow-up her blood pressure was high in the office but had  been controlled at home, so no changes were made. She reported some lightheadedness so a monitor was placed which showed her average heart rate was 48 bpm with a max of 90 and a min of 33 bpm. She also had short runs of atrial fibrillation and up to 4.8 second post-termination pauses. She saw Dr. Elberta Fortis 09/2021 and he did not recommend any changes.   At her visit 02/2022, she complained of intermittent  lightheadedness and palpitations. She had felt poorly and measured her heart rate in the 40's, blood pressure 113/51. Losartan was reduced to 25 mg in the evenings. She followed up with Gillian Shields, NP 03/2022 and reported fewer dizzy spells but didn't feel much different since splitting the losartan. BP range at home 98/56-149/60 with readings most often 110s-120s. Planned to follow-up with Dr. Elberta Fortis to revisit discussion for PPM. She had also been referred to PREP; while there on 08/23/22 she was feeling poorly and very fatigued in the setting of hypoglycemia.    Today, she states she is feeling about the same. She continues to have fatigue on a daily basis, starting with when she wakes up in the mornings. She sleeps at least 8-9 hours, but in the afternoons she may fall asleep again. Lately she hasn't tracked her home blood pressures. Pulse rates per her Fit Bit has shown resting heart rates consistently in the low 40's with some higher spikes especially at night. Her heart rate has been measured up to 108 bpm on her exercise days with the PREP program. In the past month she has reconsidered pursuing a PPM due to worsening symptoms. She is unable to see Dr. Elberta Fortis until 9/23, but also has an appointment to see Courtney Glenn, PA-C on 08/31/22. In the office her blood pressure is 146/60 initially, and 164/62 on manual recheck. She confirms having some labile blood pressures at home such as 119/75 and 136/65. Currently on losartan. Every now and then she feels a mild twinge at rest, but denies any anginal symptoms. She complains of swelling that is very noticeable, wears compression hose. She follows her Lasix regimen of 80 mg on MWF and 40 mg on all other days. Additionally she notes that she was found to be anemic one month ago at her PCP visit. She follows with her nephrologist once a year. She denies any shortness of breath, lightheadedness, headaches, syncope, orthopnea, or PND.  ROS:  Please see the  history of present illness. All other systems are reviewed and negative.  (+) Fatigue/Malaise (+) Daytime somnolence (+) LE edema bilaterally  Studies Reviewed: Marland Kitchen   EKG Interpretation Date/Time:  Tuesday August 29 2022 09:30:07 EDT Ventricular Rate:  57 PR Interval:  220 QRS Duration:  90 QT Interval:  420 QTC Calculation: 408 R Axis:   -39  Text Interpretation: Sinus bradycardia with 1st degree A-V block Left axis deviation Pulmonary disease pattern RSR' or QR pattern in V1 suggests right ventricular conduction delay When compared with ECG of 08-Feb-2021 13:39, PR interval has increased T wave inversion no longer evident in Inferior leads T wave inversion less evident in Anterolateral leads Confirmed by Chilton Si ([id number]) on 08/29/2022 9:43:42 AM    Risk Assessment/Calculations:    CHA2DS2-VASc Score = 6   This indicates a 9.7% annual risk of stroke. The patient's score is based upon: CHF History: 1 HTN History: 1 Diabetes History: 1 Stroke History: 0 Vascular Disease History: 1 Age Score: 1 Gender Score: 1    HYPERTENSION CONTROL Vitals:  08/29/22 0927 08/29/22 0958  BP: (!) 146/60 (!) 164/62    The patient's blood pressure is elevated above target today.  In order to address the patient's elevated BP: Blood pressure will be monitored at home to determine if medication changes need to be made.          Physical Exam:    VS:  BP (!) 164/62 (BP Location: Right Arm, Patient Position: Sitting, Cuff Size: Large)   Pulse (!) 57   Ht 5\' 6"  (1.676 m)   Wt 184 lb 11.2 oz (83.8 kg)   BMI 29.81 kg/m  , BMI Body mass index is 29.81 kg/m. GENERAL:  Well appearing HEENT: Pupils equal round and reactive, fundi not visualized, oral mucosa unremarkable NECK:  No jugular venous distention, waveform within normal limits, carotid upstroke brisk and symmetric, no bruits, no thyromegaly LUNGS:  Clear to auscultation bilaterally HEART:  RRR.  PMI not displaced or sustained,S1  and S2 within normal limits, no S3, no S4, no clicks, no rubs, no murmurs ABD:  Flat, positive bowel sounds normal in frequency in pitch, no bruits, no rebound, no guarding, no midline pulsatile mass, no hepatomegaly, no splenomegaly EXT:  2 plus pulses throughout, 1+ LE edema bilaterally with chronic venous stasis changes, no cyanosis no clubbing SKIN:  Chronic stasis dermatitis NEURO:  Cranial nerves II through XII grossly intact, motor grossly intact throughout PSYCH:  Cognitively intact, oriented to person place and time  Wt Readings from Last 3 Encounters:  08/29/22 184 lb 11.2 oz (83.8 kg)  08/23/22 186 lb 6.4 oz (84.6 kg)  08/22/22 186 lb 6.4 oz (84.6 kg)     ASSESSMENT AND PLAN: .      # Chronic Fatigue Persistent fatigue despite adequate sleep. No clear etiology identified.  Bradycardia and anemia both likely contributing.  TSH wnl.   -Continue current management and monitor symptoms.  # Bradycardia Resting heart rate consistently around 42-43 bpm. Exercise heart rate reaches 108 bpm. Discussed potential need for pacemaker with Dr. Elberta Fortis and she now feels ready.  She has both post-termination pauses and resting bradycardia.  -Follow up with EP later this week for further evaluation and management.  # Hypertension Blood pressure readings with significant variation. Currently on Losartan.  BP was low on higher doses.   -Track blood pressure readings twice daily for two weeks and report findings. - Avoid nodal agents.   # HFpEF:  # Chronic Edema Noted chronic stasis dermatitis. Currently on Lasix. -Start Marcelline Deist to help manage fluid retention and protect kidney function. Monitor for improvement in edema and energy levels.  # Anemia Hemoglobin at 9, likely secondary to chronic kidney disease. -Follow up with nephrology for potential erythropoietin treatment.  Follow-up in two months to assess response to [place] and overall symptom management.      Dispo:  FU with  APP/PharmD in 2 months. FU with  C. Duke Salvia, MD, [facility] in 6 months.  I,Mathew Stumpf,acting as a Neurosurgeon for Chilton Si, MD.,have documented all relevant documentation on the behalf of Chilton Si, MD,as directed by  Chilton Si, MD while in the presence of Chilton Si, MD.  I,  C. Duke Salvia, MD have reviewed all documentation for this visit.  The documentation of the exam, diagnosis, procedures, and orders on 08/29/2022 are all accurate and complete.   Signed, Chilton Si, MD

## 2022-08-29 NOTE — Patient Instructions (Addendum)
Medication Instructions:  START FARXIGA 10 MG DAILY   *If you need a refill on your cardiac medications before your next appointment, please call your pharmacy*  Lab Work: NONE  Testing/Procedures: NONE  Follow-Up: At [facility], you and your health needs are our priority.  As part of our continuing mission to provide you with exceptional heart care, we have created designated Provider Care Teams.  These Care Teams include your primary Cardiologist (physician) and Advanced Practice Providers (APPs -  Physician Assistants and Nurse Practitioners) who all work together to provide you with the care you need, when you need it.  We recommend signing up for the patient portal called "MyChart".  Sign up information is provided on this After Visit Summary.  MyChart is used to connect with patients for Virtual Visits (Telemedicine).  Patients are able to view lab/test results, encounter notes, upcoming appointments, etc.  Non-urgent messages can be sent to your provider as well.   To learn more about what you can do with MyChart, go to [email or web address].    Your next appointment:   2 month(s)  Provider:   Gillian Shields, NP OR KRISTIN PHARM D    Other Instructions MONITOR YOUR BLOOD PRESSURE TWICE A DAY. SEND MYCHART MESSAGE OR CALL WITH READINGS IN COUPLE WEEKS

## 2022-08-29 NOTE — Telephone Encounter (Signed)
Patient seen in office by Dr Duke Salvia, started on Farxiga 10 mg daily Will forward to prior auth team to initiate

## 2022-08-30 ENCOUNTER — Other Ambulatory Visit (HOSPITAL_COMMUNITY): Payer: Self-pay

## 2022-08-30 NOTE — Progress Notes (Signed)
  Electrophysiology Office Note:   Date:  08/31/2022  ID:  Courtney Pearson, DOB December 07, 1950, MRN [id number]  Primary Cardiologist: Chilton Si, MD Electrophysiologist: Will Jorja Loa, MD      History of Present Illness:   Courtney Pearson is a 72 y.o. female with h/o AF/AFL s/p mutiple ablations, HLD, CAD, HTN, HLD, and DM2 seen today for routine electrophysiology followup.   Since last being seen in our clinic the patient reports doing about the same. She complains of general fatigue. She is not very active now that she has finished PREP. Most active is chores around the house and taking out the garbage. Resting HR is 42-43 by her fit bit, but gets up into the 90-100s with exertion. She denies chest pain, outright SOB, or tachy-palpitations.  She feels like her symptoms have been overall "stable" though she has become increasingly interested in whether a pacemaker would help her symptoms or not.   Review of systems complete and found to be negative unless listed in HPI.   EP Information / Studies Reviewed:    EKG is not ordered today. EKG from 08/29/2022 reviewed which showed sinus bradycardia at 57 with 1st degree AV block at  Physical Exam:   VS:  BP 130/80   Pulse 64   Ht 5\' 6"  (1.676 m)   Wt 185 lb 12.8 oz (84.3 kg)   SpO2 98%   BMI 29.99 kg/m    Wt Readings from Last 3 Encounters:  08/31/22 185 lb 12.8 oz (84.3 kg)  08/29/22 184 lb 11.2 oz (83.8 kg)  08/23/22 186 lb 6.4 oz (84.6 kg)     GEN: Well nourished, well developed in no acute distress NECK: No JVD; No carotid bruits CARDIAC: Regular rate and rhythm, no murmurs, rubs, gallops RESPIRATORY:  Clear to auscultation without rales, wheezing or rhonchi  ABDOMEN: Soft, non-tender, non-distended EXTREMITIES:  No edema; No deformity   ASSESSMENT AND PLAN:    Persistent atrial flutter Atypical atrial flutter EKG 8/6 showed NSR S/p ablation  Continue eliquis 5 mg BID for CHA2DS2VASc  of at least 5.  Sinus  bradycardia  It is unclear how symptomatic she is, with decent chronotropic competence by report.  She does have resting bradycardia and some degree of deconditioning which also confounds her symptoms.  Discussed with Dr. Elberta Fortis and pt has a borderline indication for pacing. If her symptoms can be more clearly correlated with HRs, we will be able to better tell if she is likely to benefit from pacing.  2 week monitor and keep September follow up with Dr. Elberta Fortis.  I instructed pt to be dilligent about marking her symptoms on the monitor.   Follow up with Dr. Elberta Fortis next month as scheduled.   Signed, Graciella Freer, PA-C

## 2022-08-31 ENCOUNTER — Ambulatory Visit: Payer: Medicare Other | Admitting: Student

## 2022-08-31 ENCOUNTER — Ambulatory Visit (INDEPENDENT_AMBULATORY_CARE_PROVIDER_SITE_OTHER): Payer: Medicare Other

## 2022-08-31 ENCOUNTER — Encounter: Payer: Self-pay | Admitting: Student

## 2022-08-31 VITALS — BP 130/80 | HR 64 | Ht 66.0 in | Wt 185.8 lb

## 2022-08-31 DIAGNOSIS — I872 Venous insufficiency (chronic) (peripheral): Secondary | ICD-10-CM | POA: Diagnosis not present

## 2022-08-31 DIAGNOSIS — L821 Other seborrheic keratosis: Secondary | ICD-10-CM | POA: Diagnosis not present

## 2022-08-31 DIAGNOSIS — R001 Bradycardia, unspecified: Secondary | ICD-10-CM | POA: Diagnosis not present

## 2022-08-31 DIAGNOSIS — R5383 Other fatigue: Secondary | ICD-10-CM

## 2022-08-31 DIAGNOSIS — L814 Other melanin hyperpigmentation: Secondary | ICD-10-CM | POA: Diagnosis not present

## 2022-08-31 DIAGNOSIS — D225 Melanocytic nevi of trunk: Secondary | ICD-10-CM | POA: Diagnosis not present

## 2022-08-31 DIAGNOSIS — L218 Other seborrheic dermatitis: Secondary | ICD-10-CM | POA: Diagnosis not present

## 2022-08-31 DIAGNOSIS — R202 Paresthesia of skin: Secondary | ICD-10-CM | POA: Diagnosis not present

## 2022-08-31 NOTE — Telephone Encounter (Signed)
No p/a is req. However, per the pts pref'd pharmacy the rx is 284.00 for a 42month supply at the pharmacy and has not been picked up   Received from prior auth dept, will forward and see if test claim can be run for Jardiance to see if less expensive

## 2022-08-31 NOTE — Patient Instructions (Signed)
Medication Instructions:  Your physician recommends that you continue on your current medications as directed. Please refer to the Current Medication list given to you today.  *If you need a refill on your cardiac medications before your next appointment, please call your pharmacy*  Lab Work: None ordered If you have labs (blood work) drawn today and your tests are completely normal, you will receive your results only by: MyChart Message (if you have MyChart) OR A paper copy in the mail If you have any lab test that is abnormal or we need to change your treatment, we will call you to review the results.   Testing/Procedures: Courtney Pearson- Long Term Monitor Instructions  Your physician has requested you wear a ZIO patch monitor for 14 days.  This is a single patch monitor. [employer] supplies one patch monitor per enrollment. Additional stickers are not available. Please do not apply patch if you will be having a Nuclear Stress Test,  Echocardiogram, Cardiac CT, MRI, or Chest Xray during the period you would be wearing the  monitor. The patch cannot be worn during these tests. You cannot remove and re-apply the  ZIO XT patch monitor.  Your ZIO patch monitor will be mailed 3 day USPS to your address on file. It may take 3-5 days  to receive your monitor after you have been enrolled.  Once you have received your monitor, please review the enclosed instructions. Your monitor  has already been registered assigning a specific monitor serial # to you.  Billing and Patient Assistance Program Information  We have supplied [employer] with any of your insurance information on file for billing purposes. [employer] offers a sliding scale Patient Assistance Program for patients that do not have  insurance, or whose insurance does not completely cover the cost of the ZIO monitor.  You must apply for the Patient Assistance Program to qualify for this discounted rate.  To apply, please call [employer] at [phone],  select option 4, select option 2, ask to apply for  Patient Assistance Program. Meredeth Ide will ask your household income, and how many people  are in your household. They will quote your out-of-pocket cost based on that information.  [employer] will also be able to set up a 72-month, interest-free payment plan if needed.  Applying the monitor   Shave hair from upper left chest.  Hold abrader disc by orange tab. Rub abrader in 40 strokes over the upper left chest as  indicated in your monitor instructions.  Clean area with 4 enclosed alcohol pads. Let dry.  Apply patch as indicated in monitor instructions. Patch will be placed under collarbone on left  side of chest with arrow pointing upward.  Rub patch adhesive wings for 2 minutes. Remove white label marked "1". Remove the white  label marked "2". Rub patch adhesive wings for 2 additional minutes.  While looking in a mirror, press and release button in center of patch. A small green light will  flash 3-4 times. This will be your only indicator that the monitor has been turned on.  Do not shower for the first 24 hours. You may shower after the first 24 hours.  Press the button if you feel a symptom. You will hear a small click. Record Date, Time and  Symptom in the Patient Logbook.  When you are ready to remove the patch, follow instructions on the last 2 pages of Patient  Logbook. Stick patch monitor onto the last page of Patient Logbook.  Place Patient Logbook in  the blue and white box. Use locking tab on box and tape box closed  securely. The blue and white box has prepaid postage on it. Please place it in the mailbox as  soon as possible. Your physician should have your test results approximately 7 days after the  monitor has been mailed back to [facility].  Call [facility] Customer Care at [phone] if you have questions regarding  your ZIO XT patch monitor. Call them immediately if you see an orange light blinking on your   monitor.  If your monitor falls off in less than 4 days, contact our Monitor department at [phone].  If your monitor becomes loose or falls off after 4 days call [employer] at [phone] for  suggestions on securing your monitor    Follow-Up: At [facility], you and your health needs are our priority.  As part of our continuing mission to provide you with exceptional heart care, we have created designated Provider Care Teams.  These Care Teams include your primary Cardiologist (physician) and Advanced Practice Providers (APPs -  Physician Assistants and Nurse Practitioners) who all work together to provide you with the care you need, when you need it.  Your next appointment:   10/16/22 at 12:30 PM  Provider:   Loman Brooklyn, MD

## 2022-08-31 NOTE — Progress Notes (Unsigned)
Enrolled patient for a 14 day Zio XT monitor to be mailed to patients home  ° °[place] to read °

## 2022-09-01 ENCOUNTER — Other Ambulatory Visit (HOSPITAL_COMMUNITY): Payer: Self-pay

## 2022-09-01 ENCOUNTER — Telehealth: Payer: Self-pay

## 2022-09-01 MED ORDER — EMPAGLIFLOZIN 10 MG PO TABS: 10.0000 mg | ORAL_TABLET | Freq: Every day | ORAL | 6 refills | Status: DC

## 2022-09-01 NOTE — Telephone Encounter (Signed)
   No p/a req for jardiance. Cost is around/about 47.00/45mth

## 2022-09-01 NOTE — Telephone Encounter (Signed)
Looks like based on test claims, jardiance preferred by her insurance over [place], okay to switch scripts?

## 2022-09-01 NOTE — Telephone Encounter (Signed)
Called patient and provided the following update, patient verbalizes agreement and is okay with new script being sent in.     "Okay to utilize Jardiance 10 mg daily in place of [place].  Gillian Shields, NP "

## 2022-09-05 DIAGNOSIS — R5383 Other fatigue: Secondary | ICD-10-CM | POA: Diagnosis not present

## 2022-09-05 DIAGNOSIS — R001 Bradycardia, unspecified: Secondary | ICD-10-CM

## 2022-09-07 DIAGNOSIS — Z794 Long term (current) use of insulin: Secondary | ICD-10-CM | POA: Diagnosis not present

## 2022-09-07 DIAGNOSIS — E1022 Type 1 diabetes mellitus with diabetic chronic kidney disease: Secondary | ICD-10-CM | POA: Diagnosis not present

## 2022-09-07 DIAGNOSIS — E11319 Type 2 diabetes mellitus with unspecified diabetic retinopathy without macular edema: Secondary | ICD-10-CM | POA: Diagnosis not present

## 2022-09-07 DIAGNOSIS — E039 Hypothyroidism, unspecified: Secondary | ICD-10-CM | POA: Diagnosis not present

## 2022-09-07 DIAGNOSIS — E1042 Type 1 diabetes mellitus with diabetic polyneuropathy: Secondary | ICD-10-CM | POA: Diagnosis not present

## 2022-09-07 DIAGNOSIS — I10 Essential (primary) hypertension: Secondary | ICD-10-CM | POA: Diagnosis not present

## 2022-09-07 DIAGNOSIS — N1832 Chronic kidney disease, stage 3b: Secondary | ICD-10-CM | POA: Diagnosis not present

## 2022-09-29 DIAGNOSIS — R350 Frequency of micturition: Secondary | ICD-10-CM | POA: Diagnosis not present

## 2022-10-05 DIAGNOSIS — R001 Bradycardia, unspecified: Secondary | ICD-10-CM | POA: Diagnosis not present

## 2022-10-05 DIAGNOSIS — R5383 Other fatigue: Secondary | ICD-10-CM | POA: Diagnosis not present

## 2022-10-11 ENCOUNTER — Encounter (HOSPITAL_BASED_OUTPATIENT_CLINIC_OR_DEPARTMENT_OTHER): Payer: Self-pay | Admitting: Cardiovascular Disease

## 2022-10-16 ENCOUNTER — Ambulatory Visit: Payer: Medicare Other | Attending: Cardiology | Admitting: Cardiology

## 2022-10-16 ENCOUNTER — Encounter: Payer: Self-pay | Admitting: Cardiology

## 2022-10-16 VITALS — BP 130/74 | HR 74 | Ht 66.0 in | Wt 179.0 lb

## 2022-10-16 DIAGNOSIS — D6869 Other thrombophilia: Secondary | ICD-10-CM | POA: Diagnosis not present

## 2022-10-16 DIAGNOSIS — I4819 Other persistent atrial fibrillation: Secondary | ICD-10-CM | POA: Diagnosis not present

## 2022-10-16 NOTE — Patient Instructions (Signed)
Medication Instructions:  Your physician recommends that you continue on your current medications as directed. Please refer to the Current Medication list given to you today.  *If you need a refill on your cardiac medications before your next appointment, please call your pharmacy*   Lab Work: None ordered   Testing/Procedures: None ordered   Follow-Up: At [facility], you and your health needs are our priority.  As part of our continuing mission to provide you with exceptional heart care, we have created designated Provider Care Teams.  These Care Teams include your primary Cardiologist (physician) and Advanced Practice Providers (APPs -  Physician Assistants and Nurse Practitioners) who all work together to provide you with the care you need, when you need it.  Your next appointment:   6 month(s)  The format for your next appointment:   In Person  Provider:   You will see one of the following Advanced Practice Providers on your designated Care Team:   Francis Dowse, [place] "Mardelle Matte" [place], [place] Canary Brim, NP    Thank you for choosing [facility]!!   Dory Horn, RN [phone]

## 2022-10-16 NOTE — Progress Notes (Signed)
Electrophysiology Office Note:   Date:  10/16/2022  ID:  Courtney Pearson, DOB 1950-04-10, MRN [id number]  Primary Cardiologist: Chilton Si, MD Electrophysiologist: Regan Lemming, MD      History of Present Illness:   Courtney Pearson is a 72 y.o. female with h/o atrial fibrillation/flutter with multiple ablations, hyperlipidemia, coronary disease, hypertension, diabetes seen today for routine electrophysiology followup.   Since last being seen in our clinic the patient reports doing decently well.  She is concerned about heart rates that have been slow.  She feels somewhat fatigued and short of breath.  She did wear a cardiac monitor.  Review of the monitor showed sinus rhythm without episodes of atrial fibrillation.  She triggered episodes when her heart rates were in the 50s and 60s, 1 episode with heart rates in the 30s.  She thinks that her heart rates have been slow, in the 40s when measured on home monitoring.  She feels poorly when this occurs she is recently started [place] which she thinks has improved her overall fatigue.  she denies chest pain, palpitations, dyspnea, PND, orthopnea, nausea, vomiting, dizziness, syncope, edema, weight gain, or early satiety.   Review of systems complete and found to be negative unless listed in HPI.   EP Information / Studies Reviewed:    EKG is not ordered today. EKG from 08/29/22 reviewed which showed [place] rhythm, first-degree AV block, anterior T wave inversions        Risk Assessment/Calculations:    CHA2DS2-VASc Score = 6   This indicates a 9.7% annual risk of stroke. The patient's score is based upon: CHF History: 1 HTN History: 1 Diabetes History: 1 Stroke History: 0 Vascular Disease History: 1 Age Score: 1 Gender Score: 1             Physical Exam:   VS:  BP 130/74 (BP Location: Right Arm, Patient Position: Sitting, Cuff Size: Normal)   Pulse 74   Ht 5\' 6"  (1.676 m)   Wt 179 lb (81.2 kg)   SpO2 98%   BMI  28.89 kg/m    Wt Readings from Last 3 Encounters:  10/16/22 179 lb (81.2 kg)  08/31/22 185 lb 12.8 oz (84.3 kg)  08/29/22 184 lb 11.2 oz (83.8 kg)     GEN: Well nourished, well developed in no acute distress NECK: No JVD; No carotid bruits CARDIAC: Regular rate and rhythm, no murmurs, rubs, gallops RESPIRATORY:  Clear to auscultation without rales, wheezing or rhonchi  ABDOMEN: Soft, non-tender, non-distended EXTREMITIES:  No edema; No deformity   ASSESSMENT AND PLAN:    1.  Persistent atrial fibrillation/atypical atrial flutter: Post ablation.  Most recent EKG with sinus rhythm.  2.  Secondary hypercoagulable state: Currently on Eliquis for atrial fibrillation  3.  Sinus bradycardia: Wore cardiac monitor with sinus bradycardia and no major abnormality.  She did have symptoms on her monitor with heart rates in the 50s to 60s.  Additionally, her heart rate is in the 70s and she was short of breath walking into the office today.  At this point, I do not think that a pacemaker would provide her much relief.  She did have an episode in the 30s with symptoms, and if this recurs, pacemaker implant may be warranted.  She is feeling better on Jardiance.  4.  Hypertension: Currently well-controlled  Follow up with EP APP in 6 months  Signed, Kunta Hilleary Jorja Loa, MD

## 2022-10-31 ENCOUNTER — Encounter (HOSPITAL_BASED_OUTPATIENT_CLINIC_OR_DEPARTMENT_OTHER): Payer: Self-pay | Admitting: Family

## 2022-10-31 ENCOUNTER — Ambulatory Visit (INDEPENDENT_AMBULATORY_CARE_PROVIDER_SITE_OTHER): Payer: Medicare Other | Admitting: Family

## 2022-10-31 VITALS — BP 116/62 | HR 71 | Ht 66.0 in | Wt 179.8 lb

## 2022-10-31 DIAGNOSIS — D6859 Other primary thrombophilia: Secondary | ICD-10-CM | POA: Diagnosis not present

## 2022-10-31 DIAGNOSIS — I25118 Atherosclerotic heart disease of native coronary artery with other forms of angina pectoris: Secondary | ICD-10-CM

## 2022-10-31 DIAGNOSIS — R001 Bradycardia, unspecified: Secondary | ICD-10-CM

## 2022-10-31 DIAGNOSIS — E785 Hyperlipidemia, unspecified: Secondary | ICD-10-CM

## 2022-10-31 DIAGNOSIS — I10 Essential (primary) hypertension: Secondary | ICD-10-CM | POA: Diagnosis not present

## 2022-10-31 DIAGNOSIS — I5032 Chronic diastolic (congestive) heart failure: Secondary | ICD-10-CM

## 2022-10-31 NOTE — Progress Notes (Unsigned)
Cardiology Office Note:  .   Date:  10/31/2022  ID:  Courtney Pearson, DOB 1950/04/01, MRN [id number] PCP: Mila Palmer, MD  [facility] Providers Cardiologist:  Chilton Si, MD Electrophysiologist:  Will Jorja Loa, MD { Click to update primary MD,subspecialty MD or APP then REFRESH:1}   History of Present Illness: .   Courtney Pearson is a 72 y.o. female with a hx of persistent atrial fibrillation/flutter s/p ablation 12/2017 and DCCV and repeat ablation 01/12/2019, CAD, hypertension, hyperlipidemia, diabetes.   She was seen by endocrinology 03/2017 and noted to be in atrial flutter.  Followed in the atrial fibrillation clinic with echo 04/12/2017 with EF 55 to 60%, mildly elevated pulmonary pressures, small pericardial effusion with no tamponade.  Underwent cardioversion 04/2017 but had recurrent atrial fibrillation.  Event monitor 05/2017 with 50% atrial fibrillation burden as well as 3.3-second postconversion pause.  She started on flecainide.  Also been on amiodarone and failed multiple cardioversion.  Underwent atrial fibrillation ablation 12/2017.  Recurrent atrial fibrillation DCCV that was unsuccessful.  Palliation CT with calcium score 1712.  Myoview 03/2019 negative for ischemia.  She has had consultation for possible maze procedure with cardiothoracic surgery.  Referred back to the atrial fibrillation clinic and underwent repeat ablation 12/2020.  02/08/2021 beta-blocker was held for symptomatic bradycardia.   Seen 04/05/21 by Dr. Duke Salvia and due to elevated blood pressure losartan resumed at 50 mg daily.  She had atypical chest pain with Lexiscan Myoview 04/12/2021 was low risk study with LVEF 82%.     Seen 06/14/21, Losartan increased to 25mg  AM and 50mg  PM. Monitor due to lightheadedness average heart rate 48 bpm (max 90 bpm and minimum 33 bpm) with up to 4.8 second post termination pauses. She saw Dr. Elberta Fortis 09/2021 with no changes recommended.   At visit 02/2023  Losartan reduced to 25mg  at bedtime due to hypotension.  ***   She presents today for follow up. 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 Since splitting the dose of Losartan she is not certain she feels much different. She is taking at bedtime. Does note she has had fewer dizzy spells. She feels she does normal things but feels fatigued. Home BP log with average SBP 127 mmHg. Her two symptomatic episodes were with a heart rrate of 42 bpm. She is hesitant to have PPM but is interested if it may improve her symptoms. BP range at home 98/56-149/60 with readings most often 110s-120s.  She completed PREP program in July.   Saw Dr. Duke Salvia *** was started on Jardiance. Does feel like the Jasrdiance has helped some  Saw EP with no recommendation for PPM did not feel it would provide much relief.   Her blood pressure at home has been   BP yesterday and today hypotensive Rare episodes of fast heart rate  ROS: Please see the history of present illness.    All other systems reviewed and are negative.   Studies Reviewed: .        Cardiac Studies & Procedures     STRESS TESTS  MYOCARDIAL PERFUSION IMAGING 04/12/2021  Narrative   The study is normal. The study is low risk.   No ST deviation was noted.   LV perfusion is normal.   Left ventricular function is normal. Nuclear stress EF: 82 %. The left ventricular ejection fraction is hyperdynamic (>65%). End diastolic cavity size is normal.   Prior study available for comparison from 04/01/2019. No changes compared to prior study.  Low risk stress nuclear study with normal perfusion and normal left ventricular regional and global systolic function.   ECHOCARDIOGRAM  ECHOCARDIOGRAM COMPLETE 08/05/2021  Narrative ECHOCARDIOGRAM REPORT    Patient Name:   Courtney Pearson Date of Exam: 08/05/2021 Medical Rec #:  [id number]        Height:       67.0 in Accession #:    [id number]       Weight:       194.4 lb Date of Birth:   [birth year]        BSA:          1.998 m Patient Age:    71 years         BP:           150/68 mmHg Patient Gender: F                HR:           66 bpm. Exam Location:  [address]  Procedure: 2D Echo, 3D Echo, Cardiac Doppler, Color Doppler and Strain Analysis  Indications:    I50.33 CHF  History:        Patient has prior history of Echocardiogram examinations, most recent 08/04/2020. CAD, Arrythmias:Atrial Fibrillation, Atrial Flutter and Sick sinus syndrome. Palpitation, Signs/Symptoms:Dyspnea; Risk Factors:Hypertension and Dyslipidemia.  Sonographer:    Jorje Guild BS, RDCS Referring Phys: [id number] TIFFANY [place]  IMPRESSIONS   1. Left ventricular ejection fraction, by estimation, is 65 to 70%. The left ventricle has normal function. The left ventricle has no regional wall motion abnormalities. Left ventricular diastolic parameters are consistent with Grade II diastolic dysfunction (pseudonormalization). Elevated left atrial pressure. The average left ventricular global longitudinal strain is -26.9 %. 2. Right ventricular systolic function is mildly reduced. The right ventricular size is normal. There is moderately elevated pulmonary artery systolic pressure. The estimated right ventricular systolic pressure is 49.0 mmHg. 3. The mitral valve is degenerative. Mild mitral valve regurgitation. Mild to moderate mitral stenosis. The mean mitral valve gradient is 5.0 mmHg. Moderate mitral annular calcification. MVA 1.4 cm^2 by continuity equation 4. The aortic valve is tricuspid. Aortic valve regurgitation is not visualized. No aortic stenosis is present. 5. The inferior vena cava is normal in size with <50% respiratory variability, suggesting right atrial pressure of 8 mmHg.  Comparison(s): 08/04/20 EF 55-60%. PA pressure . Mild MS, mean PG, peak PG.  FINDINGS Left Ventricle: Left ventricular ejection fraction, by estimation, is 65 to 70%. The left ventricle has  normal function. The left ventricle has no regional wall motion abnormalities. The average left ventricular global longitudinal strain is -26.9 %. 3D left ventricular ejection fraction analysis performed but not reported based on interpreter judgement due to suboptimal tracking. The left ventricular internal cavity size was normal in size. There is no left ventricular hypertrophy. Left ventricular diastolic parameters are consistent with Grade II diastolic dysfunction (pseudonormalization). Elevated left atrial pressure.  Right Ventricle: The right ventricular size is normal. No increase in right ventricular wall thickness. Right ventricular systolic function is mildly reduced. There is moderately elevated pulmonary artery systolic pressure. The tricuspid regurgitant velocity is 3.20 m/s, and with an assumed right atrial pressure of 8 mmHg, the estimated right ventricular systolic pressure is 49.0 mmHg.  Left Atrium: Left atrial size was normal in size.  Right Atrium: Right atrial size was normal in size.  Pericardium: Trivial pericardial effusion is present.  Mitral Valve: The mitral valve is degenerative in appearance. Moderate mitral  Cardiology Office Note:  .   Date:  10/31/2022  ID:  Courtney Pearson, DOB 1950/04/01, MRN [id number] PCP: Mila Palmer, MD  [facility] Providers Cardiologist:  Chilton Si, MD Electrophysiologist:  Will Jorja Loa, MD { Click to update primary MD,subspecialty MD or APP then REFRESH:1}   History of Present Illness: .   Courtney Pearson is a 72 y.o. female with a hx of persistent atrial fibrillation/flutter s/p ablation 12/2017 and DCCV and repeat ablation 01/12/2019, CAD, hypertension, hyperlipidemia, diabetes.   She was seen by endocrinology 03/2017 and noted to be in atrial flutter.  Followed in the atrial fibrillation clinic with echo 04/12/2017 with EF 55 to 60%, mildly elevated pulmonary pressures, small pericardial effusion with no tamponade.  Underwent cardioversion 04/2017 but had recurrent atrial fibrillation.  Event monitor 05/2017 with 50% atrial fibrillation burden as well as 3.3-second postconversion pause.  She started on flecainide.  Also been on amiodarone and failed multiple cardioversion.  Underwent atrial fibrillation ablation 12/2017.  Recurrent atrial fibrillation DCCV that was unsuccessful.  Palliation CT with calcium score 1712.  Myoview 03/2019 negative for ischemia.  She has had consultation for possible maze procedure with cardiothoracic surgery.  Referred back to the atrial fibrillation clinic and underwent repeat ablation 12/2020.  02/08/2021 beta-blocker was held for symptomatic bradycardia.   Seen 04/05/21 by Dr. Duke Salvia and due to elevated blood pressure losartan resumed at 50 mg daily.  She had atypical chest pain with Lexiscan Myoview 04/12/2021 was low risk study with LVEF 82%.     Seen 06/14/21, Losartan increased to 25mg  AM and 50mg  PM. Monitor due to lightheadedness average heart rate 48 bpm (max 90 bpm and minimum 33 bpm) with up to 4.8 second post termination pauses. She saw Dr. Elberta Fortis 09/2021 with no changes recommended.   At visit 02/2023  Losartan reduced to 25mg  at bedtime due to hypotension.  ***   She presents today for follow up. 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 Since splitting the dose of Losartan she is not certain she feels much different. She is taking at bedtime. Does note she has had fewer dizzy spells. She feels she does normal things but feels fatigued. Home BP log with average SBP 127 mmHg. Her two symptomatic episodes were with a heart rrate of 42 bpm. She is hesitant to have PPM but is interested if it may improve her symptoms. BP range at home 98/56-149/60 with readings most often 110s-120s.  She completed PREP program in July.   Saw Dr. Duke Salvia *** was started on Jardiance. Does feel like the Jasrdiance has helped some  Saw EP with no recommendation for PPM did not feel it would provide much relief.   Her blood pressure at home has been   BP yesterday and today hypotensive Rare episodes of fast heart rate  ROS: Please see the history of present illness.    All other systems reviewed and are negative.   Studies Reviewed: .        Cardiac Studies & Procedures     STRESS TESTS  MYOCARDIAL PERFUSION IMAGING 04/12/2021  Narrative   The study is normal. The study is low risk.   No ST deviation was noted.   LV perfusion is normal.   Left ventricular function is normal. Nuclear stress EF: 82 %. The left ventricular ejection fraction is hyperdynamic (>65%). End diastolic cavity size is normal.   Prior study available for comparison from 04/01/2019. No changes compared to prior study.  Cardiology Office Note:  .   Date:  10/31/2022  ID:  Courtney Pearson, DOB 1950/04/01, MRN [id number] PCP: Mila Palmer, MD  [facility] Providers Cardiologist:  Chilton Si, MD Electrophysiologist:  Will Jorja Loa, MD { Click to update primary MD,subspecialty MD or APP then REFRESH:1}   History of Present Illness: .   Courtney Pearson is a 72 y.o. female with a hx of persistent atrial fibrillation/flutter s/p ablation 12/2017 and DCCV and repeat ablation 01/12/2019, CAD, hypertension, hyperlipidemia, diabetes.   She was seen by endocrinology 03/2017 and noted to be in atrial flutter.  Followed in the atrial fibrillation clinic with echo 04/12/2017 with EF 55 to 60%, mildly elevated pulmonary pressures, small pericardial effusion with no tamponade.  Underwent cardioversion 04/2017 but had recurrent atrial fibrillation.  Event monitor 05/2017 with 50% atrial fibrillation burden as well as 3.3-second postconversion pause.  She started on flecainide.  Also been on amiodarone and failed multiple cardioversion.  Underwent atrial fibrillation ablation 12/2017.  Recurrent atrial fibrillation DCCV that was unsuccessful.  Palliation CT with calcium score 1712.  Myoview 03/2019 negative for ischemia.  She has had consultation for possible maze procedure with cardiothoracic surgery.  Referred back to the atrial fibrillation clinic and underwent repeat ablation 12/2020.  02/08/2021 beta-blocker was held for symptomatic bradycardia.   Seen 04/05/21 by Dr. Duke Salvia and due to elevated blood pressure losartan resumed at 50 mg daily.  She had atypical chest pain with Lexiscan Myoview 04/12/2021 was low risk study with LVEF 82%.     Seen 06/14/21, Losartan increased to 25mg  AM and 50mg  PM. Monitor due to lightheadedness average heart rate 48 bpm (max 90 bpm and minimum 33 bpm) with up to 4.8 second post termination pauses. She saw Dr. Elberta Fortis 09/2021 with no changes recommended.   At visit 02/2023  Losartan reduced to 25mg  at bedtime due to hypotension.  ***   She presents today for follow up. 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 Since splitting the dose of Losartan she is not certain she feels much different. She is taking at bedtime. Does note she has had fewer dizzy spells. She feels she does normal things but feels fatigued. Home BP log with average SBP 127 mmHg. Her two symptomatic episodes were with a heart rrate of 42 bpm. She is hesitant to have PPM but is interested if it may improve her symptoms. BP range at home 98/56-149/60 with readings most often 110s-120s.  She completed PREP program in July.   Saw Dr. Duke Salvia *** was started on Jardiance. Does feel like the Jasrdiance has helped some  Saw EP with no recommendation for PPM did not feel it would provide much relief.   Her blood pressure at home has been   BP yesterday and today hypotensive Rare episodes of fast heart rate  ROS: Please see the history of present illness.    All other systems reviewed and are negative.   Studies Reviewed: .        Cardiac Studies & Procedures     STRESS TESTS  MYOCARDIAL PERFUSION IMAGING 04/12/2021  Narrative   The study is normal. The study is low risk.   No ST deviation was noted.   LV perfusion is normal.   Left ventricular function is normal. Nuclear stress EF: 82 %. The left ventricular ejection fraction is hyperdynamic (>65%). End diastolic cavity size is normal.   Prior study available for comparison from 04/01/2019. No changes compared to prior study.  Cardiology Office Note:  .   Date:  10/31/2022  ID:  Courtney Pearson, DOB 1950/04/01, MRN [id number] PCP: Mila Palmer, MD  [facility] Providers Cardiologist:  Chilton Si, MD Electrophysiologist:  Will Jorja Loa, MD { Click to update primary MD,subspecialty MD or APP then REFRESH:1}   History of Present Illness: .   Courtney Pearson is a 72 y.o. female with a hx of persistent atrial fibrillation/flutter s/p ablation 12/2017 and DCCV and repeat ablation 01/12/2019, CAD, hypertension, hyperlipidemia, diabetes.   She was seen by endocrinology 03/2017 and noted to be in atrial flutter.  Followed in the atrial fibrillation clinic with echo 04/12/2017 with EF 55 to 60%, mildly elevated pulmonary pressures, small pericardial effusion with no tamponade.  Underwent cardioversion 04/2017 but had recurrent atrial fibrillation.  Event monitor 05/2017 with 50% atrial fibrillation burden as well as 3.3-second postconversion pause.  She started on flecainide.  Also been on amiodarone and failed multiple cardioversion.  Underwent atrial fibrillation ablation 12/2017.  Recurrent atrial fibrillation DCCV that was unsuccessful.  Palliation CT with calcium score 1712.  Myoview 03/2019 negative for ischemia.  She has had consultation for possible maze procedure with cardiothoracic surgery.  Referred back to the atrial fibrillation clinic and underwent repeat ablation 12/2020.  02/08/2021 beta-blocker was held for symptomatic bradycardia.   Seen 04/05/21 by Dr. Duke Salvia and due to elevated blood pressure losartan resumed at 50 mg daily.  She had atypical chest pain with Lexiscan Myoview 04/12/2021 was low risk study with LVEF 82%.     Seen 06/14/21, Losartan increased to 25mg  AM and 50mg  PM. Monitor due to lightheadedness average heart rate 48 bpm (max 90 bpm and minimum 33 bpm) with up to 4.8 second post termination pauses. She saw Dr. Elberta Fortis 09/2021 with no changes recommended.   At visit 02/2023  Losartan reduced to 25mg  at bedtime due to hypotension.  ***   She presents today for follow up. 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 Since splitting the dose of Losartan she is not certain she feels much different. She is taking at bedtime. Does note she has had fewer dizzy spells. She feels she does normal things but feels fatigued. Home BP log with average SBP 127 mmHg. Her two symptomatic episodes were with a heart rrate of 42 bpm. She is hesitant to have PPM but is interested if it may improve her symptoms. BP range at home 98/56-149/60 with readings most often 110s-120s.  She completed PREP program in July.   Saw Dr. Duke Salvia *** was started on Jardiance. Does feel like the Jasrdiance has helped some  Saw EP with no recommendation for PPM did not feel it would provide much relief.   Her blood pressure at home has been   BP yesterday and today hypotensive Rare episodes of fast heart rate  ROS: Please see the history of present illness.    All other systems reviewed and are negative.   Studies Reviewed: .        Cardiac Studies & Procedures     STRESS TESTS  MYOCARDIAL PERFUSION IMAGING 04/12/2021  Narrative   The study is normal. The study is low risk.   No ST deviation was noted.   LV perfusion is normal.   Left ventricular function is normal. Nuclear stress EF: 82 %. The left ventricular ejection fraction is hyperdynamic (>65%). End diastolic cavity size is normal.   Prior study available for comparison from 04/01/2019. No changes compared to prior study.

## 2022-10-31 NOTE — Patient Instructions (Addendum)
Medication Instructions:  Your physician has recommended you make the following change in your medication:   STOP Losartan   We will send you a MyChart message in 2 weeks to check in on your blood pressure.   *If you need a refill on your cardiac medications before your next appointment, please call your pharmacy*  Follow-Up: At [facility], you and your health needs are our priority.  As part of our continuing mission to provide you with exceptional heart care, we have created designated Provider Care Teams.  These Care Teams include your primary Cardiologist (physician) and Advanced Practice Providers (APPs -  Physician Assistants and Nurse Practitioners) who all work together to provide you with the care you need, when you need it.  We recommend signing up for the patient portal called "MyChart".  Sign up information is provided on this After Visit Summary.  MyChart is used to connect with patients for Virtual Visits (Telemedicine).  Patients are able to view lab/test results, encounter notes, upcoming appointments, etc.  Non-urgent messages can be sent to your provider as well.   To learn more about what you can do with MyChart, go to [email or web address].    Your next appointment:   February or March 2025 with Dr. Duke Salvia  Other Instructions  Heart Healthy Diet Recommendations: A low-salt diet is recommended. Meats should be grilled, baked, or boiled. Avoid fried foods. Focus on lean protein sources like fish or chicken with vegetables and fruits. The American Heart Association is a Chief Technology Officer!  American Heart Association Diet and Lifeystyle Recommendations   Exercise recommendations: The American Heart Association recommends 150 minutes of moderate intensity exercise weekly. Try 30 minutes of moderate intensity exercise 4-5 times per week. This could include walking, jogging, or swimming.

## 2022-11-06 ENCOUNTER — Ambulatory Visit (INDEPENDENT_AMBULATORY_CARE_PROVIDER_SITE_OTHER): Payer: Medicare Other | Admitting: Podiatry

## 2022-11-06 DIAGNOSIS — M79675 Pain in left toe(s): Secondary | ICD-10-CM | POA: Diagnosis not present

## 2022-11-06 DIAGNOSIS — M79674 Pain in right toe(s): Secondary | ICD-10-CM

## 2022-11-06 DIAGNOSIS — Z7901 Long term (current) use of anticoagulants: Secondary | ICD-10-CM

## 2022-11-06 DIAGNOSIS — B351 Tinea unguium: Secondary | ICD-10-CM

## 2022-11-06 NOTE — Progress Notes (Unsigned)
Subjective: No chief complaint on file.   72 year old female presents the office today with concerns of thick, long urinalysis not able to trim her self.  As of nail skin along with a new cause discomfort.  The ingrown toenail is not causing as much discomfort or issues that they had been previously. Currently denies any redness or drainage to the toenails.   She is on Eliquis   Objective: AAO x3, NAD DP/PT pulses palpable bilaterally, CRT less than 3 seconds Nails are hypertrophic, dystrophic, brittle, discolored, elongated 10.  Mild incurvation of toenails without any signs of infection.  No surrounding redness or drainage. Tenderness nails 1-5 bilaterally. No open lesions or pre-ulcerative lesions are identified today. No pain with calf compression, swelling, warmth, erythema  Assessment: Bilateral hallux symptomatic onychomycosis, on Eliquis  Plan: -All treatment options discussed with the patient including all alternatives, risks, complications.  -Sharp debridement nails x 10 without any complications or bleeding. -Monitor ingrown toenails and monitoring signs or symptoms of infection or worsening discomfort. If symptoms persist may need to proceed with partial nail avulsion but we will continue with conservative treatment for now.  Vivi Barrack DPM

## 2022-11-10 DIAGNOSIS — Z23 Encounter for immunization: Secondary | ICD-10-CM | POA: Diagnosis not present

## 2022-11-13 ENCOUNTER — Encounter (HOSPITAL_BASED_OUTPATIENT_CLINIC_OR_DEPARTMENT_OTHER): Payer: Self-pay

## 2022-12-04 DIAGNOSIS — E1022 Type 1 diabetes mellitus with diabetic chronic kidney disease: Secondary | ICD-10-CM | POA: Diagnosis not present

## 2022-12-06 ENCOUNTER — Encounter (INDEPENDENT_AMBULATORY_CARE_PROVIDER_SITE_OTHER): Payer: Medicare Other | Admitting: Ophthalmology

## 2022-12-06 DIAGNOSIS — Z794 Long term (current) use of insulin: Secondary | ICD-10-CM

## 2022-12-06 DIAGNOSIS — H43813 Vitreous degeneration, bilateral: Secondary | ICD-10-CM

## 2022-12-06 DIAGNOSIS — H35033 Hypertensive retinopathy, bilateral: Secondary | ICD-10-CM

## 2022-12-06 DIAGNOSIS — E103512 Type 1 diabetes mellitus with proliferative diabetic retinopathy with macular edema, left eye: Secondary | ICD-10-CM

## 2022-12-06 DIAGNOSIS — H35372 Puckering of macula, left eye: Secondary | ICD-10-CM | POA: Diagnosis not present

## 2022-12-06 DIAGNOSIS — E103591 Type 1 diabetes mellitus with proliferative diabetic retinopathy without macular edema, right eye: Secondary | ICD-10-CM | POA: Diagnosis not present

## 2022-12-06 DIAGNOSIS — I10 Essential (primary) hypertension: Secondary | ICD-10-CM | POA: Diagnosis not present

## 2022-12-13 DIAGNOSIS — E1042 Type 1 diabetes mellitus with diabetic polyneuropathy: Secondary | ICD-10-CM | POA: Diagnosis not present

## 2022-12-13 DIAGNOSIS — Z794 Long term (current) use of insulin: Secondary | ICD-10-CM | POA: Diagnosis not present

## 2022-12-13 DIAGNOSIS — E1022 Type 1 diabetes mellitus with diabetic chronic kidney disease: Secondary | ICD-10-CM | POA: Diagnosis not present

## 2022-12-13 DIAGNOSIS — E039 Hypothyroidism, unspecified: Secondary | ICD-10-CM | POA: Diagnosis not present

## 2022-12-13 DIAGNOSIS — I10 Essential (primary) hypertension: Secondary | ICD-10-CM | POA: Diagnosis not present

## 2022-12-13 DIAGNOSIS — E11319 Type 2 diabetes mellitus with unspecified diabetic retinopathy without macular edema: Secondary | ICD-10-CM | POA: Diagnosis not present

## 2022-12-13 DIAGNOSIS — N1832 Chronic kidney disease, stage 3b: Secondary | ICD-10-CM | POA: Diagnosis not present

## 2023-01-07 DIAGNOSIS — S99921A Unspecified injury of right foot, initial encounter: Secondary | ICD-10-CM | POA: Diagnosis not present

## 2023-01-07 DIAGNOSIS — R509 Fever, unspecified: Secondary | ICD-10-CM | POA: Diagnosis not present

## 2023-01-07 DIAGNOSIS — R5383 Other fatigue: Secondary | ICD-10-CM | POA: Diagnosis not present

## 2023-01-07 DIAGNOSIS — Z03818 Encounter for observation for suspected exposure to other biological agents ruled out: Secondary | ICD-10-CM | POA: Diagnosis not present

## 2023-01-07 DIAGNOSIS — S92331A Displaced fracture of third metatarsal bone, right foot, initial encounter for closed fracture: Secondary | ICD-10-CM | POA: Diagnosis not present

## 2023-01-07 DIAGNOSIS — R0989 Other specified symptoms and signs involving the circulatory and respiratory systems: Secondary | ICD-10-CM | POA: Diagnosis not present

## 2023-01-07 DIAGNOSIS — R051 Acute cough: Secondary | ICD-10-CM | POA: Diagnosis not present

## 2023-01-10 ENCOUNTER — Other Ambulatory Visit (HOSPITAL_BASED_OUTPATIENT_CLINIC_OR_DEPARTMENT_OTHER): Payer: Self-pay | Admitting: Cardiovascular Disease

## 2023-01-10 DIAGNOSIS — I4819 Other persistent atrial fibrillation: Secondary | ICD-10-CM

## 2023-01-10 NOTE — Telephone Encounter (Signed)
Prescription refill request for Eliquis received. Indication: AF Last office visit: 10/31/22  Brunetta Genera NP Scr: 1.75 on 07/17/22 Age: 72 Weight: 81.6kg  Based on above findings Eliquis 5mg  twice daily is the appropriate dose.  Refill approved.

## 2023-01-11 DIAGNOSIS — S92331A Displaced fracture of third metatarsal bone, right foot, initial encounter for closed fracture: Secondary | ICD-10-CM | POA: Diagnosis not present

## 2023-01-20 ENCOUNTER — Encounter (HOSPITAL_BASED_OUTPATIENT_CLINIC_OR_DEPARTMENT_OTHER): Payer: Self-pay | Admitting: Cardiovascular Disease

## 2023-01-20 DIAGNOSIS — I5032 Chronic diastolic (congestive) heart failure: Secondary | ICD-10-CM

## 2023-01-20 DIAGNOSIS — I10 Essential (primary) hypertension: Secondary | ICD-10-CM

## 2023-01-22 MED ORDER — FUROSEMIDE 40 MG PO TABS
40.0000 mg | ORAL_TABLET | ORAL | 1 refills | Status: DC
Start: 2023-01-22 — End: 2023-02-13

## 2023-02-08 ENCOUNTER — Ambulatory Visit: Payer: Medicare Other | Admitting: Podiatry

## 2023-02-10 ENCOUNTER — Other Ambulatory Visit (HOSPITAL_BASED_OUTPATIENT_CLINIC_OR_DEPARTMENT_OTHER): Payer: Self-pay | Admitting: Cardiovascular Disease

## 2023-02-10 DIAGNOSIS — I5032 Chronic diastolic (congestive) heart failure: Secondary | ICD-10-CM

## 2023-02-10 DIAGNOSIS — I10 Essential (primary) hypertension: Secondary | ICD-10-CM

## 2023-02-12 DIAGNOSIS — S92332A Displaced fracture of third metatarsal bone, left foot, initial encounter for closed fracture: Secondary | ICD-10-CM | POA: Diagnosis not present

## 2023-03-15 DIAGNOSIS — E1042 Type 1 diabetes mellitus with diabetic polyneuropathy: Secondary | ICD-10-CM | POA: Diagnosis not present

## 2023-03-15 DIAGNOSIS — Z794 Long term (current) use of insulin: Secondary | ICD-10-CM | POA: Diagnosis not present

## 2023-03-15 DIAGNOSIS — R2689 Other abnormalities of gait and mobility: Secondary | ICD-10-CM | POA: Diagnosis not present

## 2023-03-15 DIAGNOSIS — E1022 Type 1 diabetes mellitus with diabetic chronic kidney disease: Secondary | ICD-10-CM | POA: Diagnosis not present

## 2023-03-15 DIAGNOSIS — I10 Essential (primary) hypertension: Secondary | ICD-10-CM | POA: Diagnosis not present

## 2023-03-15 DIAGNOSIS — K5900 Constipation, unspecified: Secondary | ICD-10-CM | POA: Diagnosis not present

## 2023-03-15 DIAGNOSIS — E11319 Type 2 diabetes mellitus with unspecified diabetic retinopathy without macular edema: Secondary | ICD-10-CM | POA: Diagnosis not present

## 2023-03-15 DIAGNOSIS — N1832 Chronic kidney disease, stage 3b: Secondary | ICD-10-CM | POA: Diagnosis not present

## 2023-03-15 DIAGNOSIS — E039 Hypothyroidism, unspecified: Secondary | ICD-10-CM | POA: Diagnosis not present

## 2023-03-16 DIAGNOSIS — S92331D Displaced fracture of third metatarsal bone, right foot, subsequent encounter for fracture with routine healing: Secondary | ICD-10-CM | POA: Diagnosis not present

## 2023-03-29 ENCOUNTER — Ambulatory Visit (HOSPITAL_BASED_OUTPATIENT_CLINIC_OR_DEPARTMENT_OTHER): Payer: Medicare Other | Admitting: Cardiovascular Disease

## 2023-03-29 ENCOUNTER — Encounter (HOSPITAL_BASED_OUTPATIENT_CLINIC_OR_DEPARTMENT_OTHER): Payer: Self-pay | Admitting: Cardiovascular Disease

## 2023-03-29 VITALS — BP 122/74 | HR 64 | Ht 66.0 in | Wt 173.6 lb

## 2023-03-29 DIAGNOSIS — I10 Essential (primary) hypertension: Secondary | ICD-10-CM

## 2023-03-29 NOTE — Progress Notes (Signed)
 Cardiology Office Note:  .   Date:  03/29/2023  ID:  Courtney Pearson, DOB 1950/07/22, MRN [id number] PCP: Mila Palmer, MD  [facility] Providers Cardiologist:  Chilton Si, MD Electrophysiologist:  Will Jorja Loa, MD    History of Present Illness: .    Courtney Pearson is a 73 y.o. female with persistent atrial fibrillation/flutter s/p ablation 12/2017 and DCCV, coronary calcification, hypertension, hyperlipidemia, and diabetes, here for follow up.  Courtney Pearson saw her endocrinologist 03/2017 and was noted to be in atrial flutter with ventricular rates in the 120s-140s.  Propranolol was switched to metoprolol and she was started on Eliquis.  She subsequently followed up in atrial fibrillation clinic and had an echo 04/12/2017 that revealed LVEF 55 to 60% with mildly elevated pulmonary pressures.  She was noted to have a small pericardial effusion but no evidence of tamponade.  She underwent cardioversion 04/2017 but had recurrent atrial for relation.  She wore an event monitor 05/2017 that showed she was in atrial fibrillation 50% of the time.  She also had a 3.3-second postconversion pause.  She was subsequently started on flecainide.  She has also been on amiodarone and failed multiple cardioversion.  She underwent atrial fibrillation ablation 12/2017.  She had recurrent atrial fibrillation and DCCV that was unsuccessful.  She had a consultation with Dr. Vickey Sages and is considering surgical ablation.  Prior to her ablation she had a preablation CT that had a calcium score 1712.  She had a [employer] 03/2019 that was negative for ischemia.    Courtney Pearson was referred back to atrial fibrillation clinic and was advised that Tikosyn was an option.  She continues to have frequent atrial fibrillation. She was volume overloaded and advised to take extra Lasix for 3 days. She called our office 05/2020 and her heart rate was in the 30s. She was advised to stop taking her metoprolol.  She  brought strips from her Courtney Pearson that showed that she was in sinus bradycardia in the 30s.  It was one of the episodes with a postconversion pause but she remained in sinus bradycardia.    Her heart rates remained poorly controlled in Afib. She was considering the MAZE procedure. She was also having bradycardia at home. She wore a 14-day Zio monitor that showed an average heart rate of 92 bpm with a minimum of 47 bpm and a 3.2 second pause.    She complained of weakness and continued to be in atrial flutter. Rate control was difficult to to sick sinus syndrome. Her first ablation was ineffective and she was considering the MAZE procedure or repeat ablation. She was referred to the PREP program. Calcium score was 3153 which was 99th percentile for age-, race-, and sex-matched controls. She saw Dr. Johney Frame 12/2020 for repeat ablation. She saw Courtney Pearson in the Afib clinic 02/08/21 and was doing well. Her beta-blocker was stopped for symptomatic bradycardia. She presented to the ED 01/2021 for antiviral therapy after a positive COVID home test. She was given molnupiravir and Tessalon Perles.   She stopped metoprolol due to bradycardia. She had been exercising regularly but did have some exertional dyspnea. She had a pre-procedural CT showed a calcium score in the 99th percentile, and a nuclear stress test 03/2021 that was low-risk. She saw Courtney Pearson, Courtney Pearson and losartan was increased to 25 mg in the morning and 50 mg at night. At follow-up her blood pressure was high in the office but had been controlled  at home, so no changes were made. She reported some lightheadedness so a monitor was placed which showed her average heart rate was 48 bpm with a max of 90 and a min of 33 bpm. She also had short runs of atrial fibrillation and up to 4.8 second post-termination pauses. She saw Dr. Elberta Pearson 09/2021 and he did not recommend any changes.    At her visit 02/2022, she complained of intermittent  lightheadedness and palpitations. She had felt poorly and measured her heart rate in the 40's, blood pressure 113/51. Losartan was reduced to 25 mg in the evenings. She followed up with Courtney Pearson, Courtney Pearson and reported fewer dizzy spells but didn't feel much different since splitting the losartan. BP range at home 98/56-149/60 with readings most often 110s-120s. Planned to follow-up with Dr. Elberta Pearson to revisit discussion for PPM. She had also been referred to PREP; while there on 08/23/22 she was feeling poorly and very fatigued in the setting of hypoglycemia.    At her/2024 she noted some heart rates consistently in the 40s on her Fitbit.  She was participating in the PR EP program at the [facility].  Given worsening symptoms she was considering follow-up with EP in getting a pacemaker.  She last saw Dr. Elberta Pearson on 09/2022.  She remained in sinus rhythm after ablation.  He noted because she reported symptoms of shortness of breath when her heart rate was in the 70s as well as in the 50s and 60s, he was not certain that having a pacemaker would provide relief.  He did report an episode in the 30s with symptoms and felt that if that recurred he would pursue pacemaker implantation.  He recommended follow-up in 6 months.   She last saw Courtney Shields, Courtney Pearson 10/2022 and noted some hypotensive episodes.  Losartan was discontinued.  Courtney Pearson experiences episodes of bradycardia, with heart rates often in the forties and occasionally in the fifties and sixties. There are no consistent symptoms associated with these low heart rates.   She describes a persistent feeling of imbalance, distinct from lightheadedness and not associated with hypotension. The imbalance is related to head movement and differs from vertigo, which she has experienced infrequently in the past year. In December, she sustained a foot fracture, initially contributing to her imbalance, but the fracture has since healed. Despite this, the imbalance  persists, and she is awaiting a neurology appointment in June. She has a history of therapy for balance issues previously related to weakness in her feet and legs, but the current sensation is different and more centered in her head.  She is currently on Jardiance and Lasix, which cause frequent urination. She is concerned about potential dehydration contributing to her symptoms, noting a dry mouth. No feeling of impending syncope, orthopnea, or known episodes of atrial fibrillation.  She has not been getting much exercise recently but owns a recumbent bike.    ROS:  As per HPI  Studies Reviewed: Marland Kitchen   EKG Interpretation Date/Time:  Thursday March 29 2023 10:01:23 EST Ventricular Rate:  64 PR Interval:  246 QRS Duration:  86 QT Interval:  422 QTC Calculation: 435 R Axis:   -47  Text Interpretation: Sinus rhythm with 1st degree A-V block with Premature supraventricular complexes Pulmonary disease pattern Left anterior fascicular block When compared with ECG of 29-Aug-2022 09:30, Premature supraventricular complexes are now Present Confirmed by Chilton Si ([id number]) on 03/29/2023 10:05:00 AM     Risk Assessment/Calculations:    CHA2DS2-VASc Score = 6  This indicates a 9.7% annual risk of stroke. The patient's score is based upon: CHF History: 1 HTN History: 1 Diabetes History: 1 Stroke History: 0 Vascular Disease History: 1 Age Score: 1 Gender Score: 1            Physical Exam:   VS:  BP 122/74   Pulse 64   Ht 5\' 6"  (1.676 m)   Wt 173 lb 9.6 oz (78.7 kg)   SpO2 97%   BMI 28.02 kg/m  , BMI Body mass index is 28.02 kg/m. GENERAL:  Well appearing HEENT: Pupils equal round and reactive, fundi not visualized, oral mucosa unremarkable NECK:  No jugular venous distention, waveform within normal limits, carotid upstroke brisk and symmetric, no bruits, no thyromegaly LUNGS:  Clear to auscultation bilaterally HEART:  RRR.  PMI not displaced or sustained,S1 and S2 within normal  limits, no S3, no S4, no clicks, no rubs, II/VI systolic murmur at the LUSB ABD:  Flat, positive bowel sounds normal in frequency in pitch, no bruits, no rebound, no guarding, no midline pulsatile mass, no hepatomegaly, no splenomegaly EXT:  2 plus pulses throughout, trace LE edema, no cyanosis no clubbing SKIN:  +Chronic stasis dermatitis NEURO:  Cranial nerves II through XII grossly intact, motor grossly intact throughout PSYCH:  Cognitively intact, oriented to person place and time   ASSESSMENT AND PLAN: .    # Bradycardia Reports heart rates in the 40s, but no associated symptoms of lightheadedness or dizziness. EKG shows no significant abnormalities. -Continue current management and monitor symptoms.  Continue EP follow up as planned.   # Imbalance Reports feeling of imbalance, not associated with low blood pressure or heart rate. No vertigo or room spinning sensation. Not associated with bradycardia or orthostasis.  Neurology referral made. -Await neurology consultation in June.  # Diabetes Reports feeling better on Jardiance. -Continue Jardiance as prescribed.  # Edema Mild fluid retention noted in lower extremities, but no shortness of breath or other symptoms of fluid overload. -Continue current diuretic regimen.   # Atrial fibrillation/flutter: S/p ablation.  She denies recurrent symptoms.  Continue Eliquis.   General Health Maintenance -Check kidney function at upcoming nephrology appointment. -Consider use of compression stockings for edema. -Encourage use of recumbent bike for exercise. -Follow up in 6 months.      Signed, Chilton Si, MD

## 2023-03-29 NOTE — Patient Instructions (Signed)
 Medication Instructions:  Your physician recommends that you continue on your current medications as directed. Please refer to the Current Medication list given to you today.  *If you need a refill on your cardiac medications before your next appointment, please call your pharmacy*  Lab Work: HAVE YOUR KIDNEY DOCTOR SEND DR [place] YOUR LABS WHEN YOU HAVE THEM DONE   Testing/Procedures: NONE  Follow-Up: At [facility], you and your health needs are our priority.  As part of our continuing mission to provide you with exceptional heart care, we have created designated Provider Care Teams.  These Care Teams include your primary Cardiologist (physician) and Advanced Practice Providers (APPs -  Physician Assistants and Nurse Practitioners) who all work together to provide you with the care you need, when you need it.  We recommend signing up for the patient portal called "MyChart".  Sign up information is provided on this After Visit Summary.  MyChart is used to connect with patients for Virtual Visits (Telemedicine).  Patients are able to view lab/test results, encounter notes, upcoming appointments, etc.  Non-urgent messages can be sent to your provider as well.   To learn more about what you can do with MyChart, go to [email or web address].    Your next appointment:   6 month(s)  The format for your next appointment:   In Person  Provider:   DR [facility] OR Ronn Melena NP

## 2023-04-03 ENCOUNTER — Other Ambulatory Visit (HOSPITAL_BASED_OUTPATIENT_CLINIC_OR_DEPARTMENT_OTHER): Payer: Self-pay | Admitting: Family

## 2023-04-04 DIAGNOSIS — N1832 Chronic kidney disease, stage 3b: Secondary | ICD-10-CM | POA: Diagnosis not present

## 2023-04-04 DIAGNOSIS — I10 Essential (primary) hypertension: Secondary | ICD-10-CM | POA: Diagnosis not present

## 2023-04-04 DIAGNOSIS — E1029 Type 1 diabetes mellitus with other diabetic kidney complication: Secondary | ICD-10-CM | POA: Diagnosis not present

## 2023-04-04 DIAGNOSIS — R809 Proteinuria, unspecified: Secondary | ICD-10-CM | POA: Diagnosis not present

## 2023-04-14 DIAGNOSIS — L03115 Cellulitis of right lower limb: Secondary | ICD-10-CM | POA: Diagnosis not present

## 2023-04-23 ENCOUNTER — Encounter: Payer: Self-pay | Admitting: Family Medicine

## 2023-04-23 DIAGNOSIS — L0390 Cellulitis, unspecified: Secondary | ICD-10-CM | POA: Diagnosis not present

## 2023-04-23 DIAGNOSIS — I10 Essential (primary) hypertension: Secondary | ICD-10-CM | POA: Diagnosis not present

## 2023-04-23 DIAGNOSIS — I8310 Varicose veins of unspecified lower extremity with inflammation: Secondary | ICD-10-CM | POA: Diagnosis not present

## 2023-04-23 DIAGNOSIS — R0989 Other specified symptoms and signs involving the circulatory and respiratory systems: Secondary | ICD-10-CM | POA: Diagnosis not present

## 2023-04-24 ENCOUNTER — Other Ambulatory Visit (HOSPITAL_COMMUNITY): Payer: Self-pay | Admitting: Family Medicine

## 2023-04-24 DIAGNOSIS — R0989 Other specified symptoms and signs involving the circulatory and respiratory systems: Secondary | ICD-10-CM

## 2023-04-27 ENCOUNTER — Ambulatory Visit (HOSPITAL_COMMUNITY)

## 2023-05-08 ENCOUNTER — Ambulatory Visit (HOSPITAL_COMMUNITY)
Admission: RE | Admit: 2023-05-08 | Discharge: 2023-05-08 | Disposition: A | Discharge status: Home / Self Care | Source: Ambulatory Visit | Attending: Family Medicine | Admitting: Family Medicine

## 2023-05-08 DIAGNOSIS — R0989 Other specified symptoms and signs involving the circulatory and respiratory systems: Secondary | ICD-10-CM | POA: Insufficient documentation

## 2023-05-08 LAB — VAS US ABI WITH/WO TBI

## 2023-05-23 ENCOUNTER — Ambulatory Visit: Attending: Vascular Surgery | Admitting: Vascular Surgery

## 2023-05-23 ENCOUNTER — Encounter: Payer: Self-pay | Admitting: Vascular Surgery

## 2023-05-23 VITALS — BP 163/65 | HR 68 | Temp 97.9°F | Resp 20 | Ht 66.0 in | Wt 176.5 lb

## 2023-05-23 DIAGNOSIS — I872 Venous insufficiency (chronic) (peripheral): Secondary | ICD-10-CM

## 2023-05-23 DIAGNOSIS — R6889 Other general symptoms and signs: Secondary | ICD-10-CM

## 2023-05-23 NOTE — Progress Notes (Signed)
 Patient ID: Courtney Pearson, female   DOB: Aug 14, 1950, 73 y.o.   MRN: [id number]  Reason for Consult: New Patient (Initial Visit)   Referred by Espinoza, Alejandra, DO  Subjective:     HPI:  Courtney Pearson is a 73 y.o. female without previous history of vascular disease.  She had an infection on the dorsum of her right foot which cleared with antibiotics.  She has had diabetes for over 50 years but states that she controls this well with diet and medications.  She also has risk factors of hypertension and hyperlipidemia.  She has a long history of swelling in her legs more recently right has been greater than left this has also been controlled with diuretics and occasional compression stockings.  She has skin changes that have been present for many years and also has varicosities in the right lower extremity without any previous venous interventions.  Past Medical History:  Diagnosis Date   Acute on chronic diastolic heart failure (HCC) 03/12/2020   Atypical atrial flutter (HCC) 03/12/2020   CAD in native artery 03/20/2019   Chronic diastolic heart failure (HCC) 03/12/2020   Diabetes mellitus type 1 (HCC) 08/29/2022   History of anemia due to chronic kidney disease 08/29/2022   HTN (hypertension)    Hyperlipidemia    Hypothyroidism    Mitral stenosis 11/16/2021   Palpitation    Pancytopenia (HCC) 08/29/2022   Persistent atrial fibrillation (HCC) 04/03/2017   Renal insufficiency    Sick sinus syndrome (HCC) 07/06/2020   Weakness 10/06/2020   Family History  Problem Relation Age of Onset   Hypertension Mother    Osteoarthritis Mother    Osteoporosis Mother    Anemia Father    Asthma Sister    Osteoarthritis Sister    OCD Sister    Breast cancer Maternal Aunt    Leukemia Brother    Lupus Child    Heart disease Other        No family history   Past Surgical History:  Procedure Laterality Date   ATRIAL FIBRILLATION ABLATION N/A 01/01/2018   Procedure: ATRIAL  FIBRILLATION ABLATION;  Surgeon: Jolly Needle, MD;  Location: [facility];  Service: Cardiovascular;  Laterality: N/A;   ATRIAL FIBRILLATION ABLATION N/A 10/31/2018   Procedure: ATRIAL FIBRILLATION ABLATION;  Surgeon: Jolly Needle, MD;  Location: [facility];  Service: Cardiovascular;  Laterality: N/A;   ATRIAL FIBRILLATION ABLATION N/A 01/06/2021   Procedure: ATRIAL FIBRILLATION ABLATION;  Surgeon: Jolly Needle, MD;  Location: [facility];  Service: Cardiovascular;  Laterality: N/A;   BREAST BIOPSY     BREAST EXCISIONAL BIOPSY Right 1993   CARDIOVERSION N/A 04/30/2017   Procedure: CARDIOVERSION;  Surgeon: Maudine Sos, MD;  Location: [facility];  Service: Cardiovascular;  Laterality: N/A;   CARDIOVERSION N/A 02/15/2018   Procedure: CARDIOVERSION;  Surgeon: Maudine Sos, MD;  Location: [facility]COPY;  Service: Cardiovascular;  Laterality: N/A;   CARDIOVERSION N/A 04/10/2018   Procedure: CARDIOVERSION;  Surgeon: Loyde Rule, MD;  Location: [facility] ENDOSCOPY;  Service: Cardiovascular;  Laterality: N/A;   CESAREAN SECTION     EYE SURGERY      Short Social History:  Social History   Tobacco Use   Smoking status: Never   Smokeless tobacco: Never  Substance Use Topics   Alcohol use: No    No Known Allergies  Current Outpatient Medications  Medication Sig Dispense Refill   Accu-Chek Softclix Lancets lancets use to check blood sugar, fingerstick for 100  days once a day IC-10 CODE: E10.43     acetaminophen  (TYLENOL ) 500 MG tablet Take 500 mg by mouth every 8 (eight) hours as needed for moderate pain or headache.     amoxicillin (AMOXIL) 500 MG capsule Take 4 capsules by mouth See admin instructions. 1 hour prior to dental appointments     apixaban  (ELIQUIS ) 5 MG TABS tablet TAKE 1 TABLET TWICE DAILY 180 tablet 1   atorvastatin (LIPITOR) 10 MG tablet Take 10 mg by mouth every other day.      bismuth subsalicylate (PEPTO BISMOL) 262 MG/15ML suspension Take  30 mLs by mouth daily as needed for indigestion.     Blood Glucose Monitoring Suppl (ACCU-CHEK GUIDE) w/Device KIT use to check blood sugar for 365 days once a day IC-10 CODE: E10.43     calcium  carbonate (TUMS EX) 750 MG chewable tablet Chew 750 mg by mouth daily.      Cholecalciferol (VITAMIN D-3) 25 MCG (1000 UT) CAPS Take 1,000 Units by mouth daily.     Continuous Glucose Receiver (FREESTYLE LIBRE 3 READER) DEVI change sensor every 15 days for 365 days     Continuous Glucose Sensor (FREESTYLE LIBRE 3 PLUS SENSOR) MISC change sensor every 15 days for 90 days     fexofenadine (ALLEGRA) 180 MG tablet Take 180 mg by mouth as needed for allergies or rhinitis.     FIASP 100 UNIT/ML SOLN SMARTSIG:0-25 Unit(s) SUB-Q Daily     furosemide  (LASIX ) 40 MG tablet TAKE 80 MG ON MONDAY, WEDNESDAY, AND FRIDAY ONLY TAKE 40 MG ALL OTHER DAYS 90 tablet 3   hydrocortisone cream 1 % Apply 1 application topically daily as needed for itching.     JARDIANCE  10 MG TABS tablet TAKE 1 TABLET BY MOUTH DAILY BEFORE BREAKFAST. 30 tablet 6   levothyroxine (SYNTHROID) 75 MCG tablet 1 tablet in the morning on an empty stomach     loperamide (IMODIUM A-D) 2 MG tablet Take 2-4 mg by mouth as needed for diarrhea or loose stools.      Multiple Vitamins-Minerals (MULTIVITAMIN ADULT PO) Take 1 tablet by mouth once a week.     No current facility-administered medications for this visit.    Review of Systems  Constitutional:  Constitutional negative. HENT: HENT negative.  Eyes: Eyes negative.  Respiratory: Respiratory negative.  Cardiovascular: Positive for leg swelling.  GI: Gastrointestinal negative.  Musculoskeletal: Positive for leg pain.  Neurological: Neurological negative. Hematologic: Hematologic/lymphatic negative.  Psychiatric: Psychiatric negative.        Objective:  Objective   Vitals:   05/23/23 1120  BP: (!) 163/65  Pulse: 68  Resp: 20  Temp: 97.9 F (36.6 C)  SpO2: 98%  Weight: 176 lb 8 oz (80.1  kg)  Height: 5\' 6"  (1.676 m)   Body mass index is 28.49 kg/m.  Physical Exam HENT:     Head: Normocephalic.     Mouth/Throat:     Mouth: Mucous membranes are moist.  Eyes:     Pupils: Pupils are equal, round, and reactive to light.  Cardiovascular:     Rate and Rhythm: Normal rate.     Pulses:          Posterior tibial pulses are 1+ on the right side and 1+ on the left side.  Pulmonary:     Effort: Pulmonary effort is normal.  Abdominal:     General: Abdomen is flat.  Musculoskeletal:     Right lower leg: Edema present.     Left  lower leg: Edema present.     Comments: Thick skin bilaterally with hemosiderin deposition  Skin:    General: Skin is warm.     Capillary Refill: Capillary refill takes less than 2 seconds.  Neurological:     General: No focal deficit present.     Mental Status: She is alert.  Psychiatric:        Mood and Affect: Mood normal.        Thought Content: Thought content normal.        Judgment: Judgment normal.     Data: ABI Findings:  +---------+------------------+-----+---------+----------------+  Right   Rt Pressure (mmHg)IndexWaveform Comment           +---------+------------------+-----+---------+----------------+  Brachial 157                    triphasic                  +---------+------------------+-----+---------+----------------+  ATA     255               1.48 triphasicnon compressible  +---------+------------------+-----+---------+----------------+  PTA     255               1.48 triphasicnon compressible  +---------+------------------+-----+---------+----------------+  Great Toe88                0.51 Abnormal                   +---------+------------------+-----+---------+----------------+   +---------+------------------+-----+---------+-------+  Left    Lt Pressure (mmHg)IndexWaveform Comment  +---------+------------------+-----+---------+-------+  Brachial 172                     triphasic         +---------+------------------+-----+---------+-------+  ATA     255               1.48 triphasic         +---------+------------------+-----+---------+-------+  PTA     255               1.48 triphasic         +---------+------------------+-----+---------+-------+  Great Toe111               0.65 Abnormal          +---------+------------------+-----+---------+-------+   +-------+----------------+-----------+------------+------------+  ABI/TBIToday's ABI     Today's TBIPrevious ABIPrevious TBI  +-------+----------------+-----------+------------+------------+  Right non compressible0.51       [place]          0.76          +-------+----------------+-----------+------------+------------+  Left  non compressible0.65       [place]          0.74          +-------+----------------+-----------+------------+------------+       Summary:  Right: Resting right ankle-brachial index indicates noncompressible right  lower extremity arteries. The right toe-brachial index is abnormal.   Left: Resting left ankle-brachial index indicates noncompressible left  lower extremity arteries. The left toe-brachial index is abnormal.      Assessment/Plan:    73 year old female with history of recent right foot infection appears more related to venous insufficiency than arterial insufficiency with preserved ABIs although noncompressible with triphasic waveforms bilaterally.  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  I have recommended continued compression stockings as tolerated elevation of her legs when she is resting.  Patient currently not interested in any procedures that are not absolutely necessary and  as such can see me on an as-needed basis. Prn with venous reflux RLE     Adine Hoof MD [facility]

## 2023-05-24 ENCOUNTER — Encounter: Payer: Self-pay | Admitting: Vascular Surgery

## 2023-06-06 ENCOUNTER — Encounter (INDEPENDENT_AMBULATORY_CARE_PROVIDER_SITE_OTHER): Payer: Medicare Other | Admitting: Ophthalmology

## 2023-06-06 ENCOUNTER — Other Ambulatory Visit (HOSPITAL_BASED_OUTPATIENT_CLINIC_OR_DEPARTMENT_OTHER): Payer: Self-pay | Admitting: Cardiovascular Disease

## 2023-06-06 DIAGNOSIS — E103512 Type 1 diabetes mellitus with proliferative diabetic retinopathy with macular edema, left eye: Secondary | ICD-10-CM

## 2023-06-06 DIAGNOSIS — H35033 Hypertensive retinopathy, bilateral: Secondary | ICD-10-CM

## 2023-06-06 DIAGNOSIS — I10 Essential (primary) hypertension: Secondary | ICD-10-CM

## 2023-06-06 DIAGNOSIS — H35372 Puckering of macula, left eye: Secondary | ICD-10-CM | POA: Diagnosis not present

## 2023-06-06 DIAGNOSIS — H43813 Vitreous degeneration, bilateral: Secondary | ICD-10-CM | POA: Diagnosis not present

## 2023-06-06 DIAGNOSIS — E103591 Type 1 diabetes mellitus with proliferative diabetic retinopathy without macular edema, right eye: Secondary | ICD-10-CM

## 2023-06-06 DIAGNOSIS — H35342 Macular cyst, hole, or pseudohole, left eye: Secondary | ICD-10-CM

## 2023-06-06 DIAGNOSIS — I4819 Other persistent atrial fibrillation: Secondary | ICD-10-CM

## 2023-06-06 NOTE — Telephone Encounter (Signed)
 Prescription refill request for Eliquis  received. Indication:afib Last office visit:3/25 Scr:1.75  6/24 Age: 73 Weight:80.1  kg  Prescription refilled

## 2023-06-13 DIAGNOSIS — E1042 Type 1 diabetes mellitus with diabetic polyneuropathy: Secondary | ICD-10-CM | POA: Diagnosis not present

## 2023-06-13 DIAGNOSIS — E039 Hypothyroidism, unspecified: Secondary | ICD-10-CM | POA: Diagnosis not present

## 2023-06-14 DIAGNOSIS — R748 Abnormal levels of other serum enzymes: Secondary | ICD-10-CM | POA: Diagnosis not present

## 2023-06-14 DIAGNOSIS — E039 Hypothyroidism, unspecified: Secondary | ICD-10-CM | POA: Diagnosis not present

## 2023-06-14 DIAGNOSIS — E11319 Type 2 diabetes mellitus with unspecified diabetic retinopathy without macular edema: Secondary | ICD-10-CM | POA: Diagnosis not present

## 2023-06-14 DIAGNOSIS — Z794 Long term (current) use of insulin: Secondary | ICD-10-CM | POA: Diagnosis not present

## 2023-06-14 DIAGNOSIS — I10 Essential (primary) hypertension: Secondary | ICD-10-CM | POA: Diagnosis not present

## 2023-06-14 DIAGNOSIS — E1042 Type 1 diabetes mellitus with diabetic polyneuropathy: Secondary | ICD-10-CM | POA: Diagnosis not present

## 2023-06-14 DIAGNOSIS — N1832 Chronic kidney disease, stage 3b: Secondary | ICD-10-CM | POA: Diagnosis not present

## 2023-06-14 DIAGNOSIS — R932 Abnormal findings on diagnostic imaging of liver and biliary tract: Secondary | ICD-10-CM | POA: Diagnosis not present

## 2023-06-14 DIAGNOSIS — E1022 Type 1 diabetes mellitus with diabetic chronic kidney disease: Secondary | ICD-10-CM | POA: Diagnosis not present

## 2023-07-04 ENCOUNTER — Telehealth: Payer: Self-pay | Admitting: Diagnostic Neuroimaging

## 2023-07-04 ENCOUNTER — Ambulatory Visit (INDEPENDENT_AMBULATORY_CARE_PROVIDER_SITE_OTHER): Payer: Medicare Other | Admitting: Diagnostic Neuroimaging

## 2023-07-04 ENCOUNTER — Encounter: Payer: Self-pay | Admitting: Diagnostic Neuroimaging

## 2023-07-04 VITALS — BP 138/66 | HR 60 | Ht 66.0 in | Wt 174.8 lb

## 2023-07-04 DIAGNOSIS — R269 Unspecified abnormalities of gait and mobility: Secondary | ICD-10-CM | POA: Diagnosis not present

## 2023-07-04 DIAGNOSIS — R42 Dizziness and giddiness: Secondary | ICD-10-CM | POA: Diagnosis not present

## 2023-07-04 NOTE — Patient Instructions (Signed)
-   check MRI brain - refer to PT - use cane / walker

## 2023-07-04 NOTE — Telephone Encounter (Signed)
 no auth required sent to GI [phone]

## 2023-07-04 NOTE — Progress Notes (Signed)
 [facility]  PATIENT: Courtney Pearson DOB: 28-Mar-1950  REFERRING CLINICIAN: Gordy Lauber, MD HISTORY FROM: patient  REASON FOR VISIT: new consult   HISTORICAL  CHIEF COMPLAINT:  Chief Complaint  Patient presents with   New Patient (Initial Visit)    RM 6, Pt alone. Referred by endocrinologist. Pt states she has had an off balance feeling in her head. Pt reports having neuropathy in BLE. Denies any falls in last 6 months. Using a cane for ambulation today and reports using it a little more than usual. Pt states HR is sometimes in 40s    HISTORY OF PRESENT ILLNESS:   73 year old female here for evaluation of dizziness and abnormal sensations.  Since February 2025 patient has had abnormal feeling in her head feeling somewhat swimmy and off balance.  No vertigo or lightheadedness.  She has a hard time describing the sensations.  For 5 years patient has had gradual onset progressive gait and balance difficulty, initially starting with some mild stumbling issues to starting to use a cane about 1 year ago.  Has some burning sensations in the toes and feet with some aching pain related to neuropathy.  Sometimes she veers to the right left side.  History of type 1 diabetes since age 38 years old.   REVIEW OF SYSTEMS: Full 14 system review of systems performed and negative with exception of: as per HPI.  ALLERGIES: No Known Allergies  HOME MEDICATIONS: Outpatient Medications Prior to Visit  Medication Sig Dispense Refill   Accu-Chek Softclix Lancets lancets use to check blood sugar, fingerstick for 100 days once a day IC-10 CODE: E10.43     acetaminophen  (TYLENOL ) 500 MG tablet Take 500 mg by mouth every 8 (eight) hours as needed for moderate pain or headache.     atorvastatin (LIPITOR) 10 MG tablet Take 10 mg by mouth every other day.      bismuth subsalicylate (PEPTO BISMOL) 262 MG/15ML suspension Take 30 mLs by mouth daily as needed for indigestion.      Blood Glucose Monitoring Suppl (ACCU-CHEK GUIDE) w/Device KIT use to check blood sugar for 365 days once a day IC-10 CODE: E10.43     calcium  carbonate (TUMS EX) 750 MG chewable tablet Chew 750 mg by mouth daily.      Cholecalciferol (VITAMIN D-3) 25 MCG (1000 UT) CAPS Take 1,000 Units by mouth daily.     Continuous Glucose Receiver (FREESTYLE LIBRE 3 READER) DEVI change sensor every 15 days for 365 days     Continuous Glucose Sensor (FREESTYLE LIBRE 3 PLUS SENSOR) MISC change sensor every 15 days for 90 days     ELIQUIS  5 MG TABS tablet TAKE 1 TABLET TWICE DAILY 180 tablet 3   fexofenadine (ALLEGRA) 180 MG tablet Take 180 mg by mouth as needed for allergies or rhinitis.     FIASP 100 UNIT/ML SOLN SMARTSIG:0-25 Unit(s) SUB-Q Daily     furosemide  (LASIX ) 40 MG tablet TAKE 80 MG ON MONDAY, WEDNESDAY, AND FRIDAY ONLY TAKE 40 MG ALL OTHER DAYS 90 tablet 3   hydrocortisone cream 1 % Apply 1 application topically daily as needed for itching.     JARDIANCE  10 MG TABS tablet TAKE 1 TABLET BY MOUTH DAILY BEFORE BREAKFAST. 30 tablet 6   levothyroxine (SYNTHROID) 75 MCG tablet 1 tablet in the morning on an empty stomach     loperamide (IMODIUM A-D) 2 MG tablet Take 2-4 mg by mouth as needed for diarrhea or loose stools.  Multiple Vitamins-Minerals (MULTIVITAMIN ADULT PO) Take 1 tablet by mouth once a week.     amoxicillin (AMOXIL) 500 MG capsule Take 4 capsules by mouth See admin instructions. 1 hour prior to dental appointments (Patient not taking: Reported on 07/04/2023)     No facility-administered medications prior to visit.    PAST MEDICAL HISTORY: Past Medical History:  Diagnosis Date   Acute on chronic diastolic heart failure (HCC) 03/12/2020   Atypical atrial flutter (HCC) 03/12/2020   CAD in native artery 03/20/2019   Chronic diastolic heart failure (HCC) 03/12/2020   Diabetes mellitus type 1 (HCC) 1969   History of anemia due to chronic kidney disease 08/29/2022   HTN (hypertension)     Hyperlipidemia    Hypothyroidism    Mitral stenosis 11/16/2021   Osteoporosis 2024   Palpitation    Pancytopenia (HCC) 08/29/2022   Persistent atrial fibrillation (HCC) 04/03/2017   Renal insufficiency    Sick sinus syndrome (HCC) 07/06/2020   Weakness 10/06/2020    PAST SURGICAL HISTORY: Past Surgical History:  Procedure Laterality Date   ATRIAL FIBRILLATION ABLATION N/A 01/01/2018   Procedure: ATRIAL FIBRILLATION ABLATION;  Surgeon: Jolly Needle, MD;  Location: [facility];  Service: Cardiovascular;  Laterality: N/A;   ATRIAL FIBRILLATION ABLATION N/A 10/31/2018   Procedure: ATRIAL FIBRILLATION ABLATION;  Surgeon: Jolly Needle, MD;  Location: [facility];  Service: Cardiovascular;  Laterality: N/A;   ATRIAL FIBRILLATION ABLATION N/A 01/06/2021   Procedure: ATRIAL FIBRILLATION ABLATION;  Surgeon: Jolly Needle, MD;  Location: [facility];  Service: Cardiovascular;  Laterality: N/A;   BREAST BIOPSY     BREAST EXCISIONAL BIOPSY Right 1993   CARDIOVERSION N/A 04/30/2017   Procedure: CARDIOVERSION;  Surgeon: Maudine Sos, MD;  Location: [facility] ENDOSCOPY;  Service: Cardiovascular;  Laterality: N/A;   CARDIOVERSION N/A 02/15/2018   Procedure: CARDIOVERSION;  Surgeon: Maudine Sos, MD;  Location: [facility] ENDOSCOPY;  Service: Cardiovascular;  Laterality: N/A;   CARDIOVERSION N/A 04/10/2018   Procedure: CARDIOVERSION;  Surgeon: Loyde Rule, MD;  Location: [facility] ENDOSCOPY;  Service: Cardiovascular;  Laterality: N/A;   CESAREAN SECTION     EYE SURGERY      FAMILY HISTORY: Family History  Problem Relation Age of Onset   Hypertension Mother    Osteoarthritis Mother    Osteoporosis Mother    Anemia Father    Asthma Sister    Osteoarthritis Sister    OCD Sister    Breast cancer Maternal Aunt    Leukemia Brother    Lupus Child    Heart disease Other        No family history    SOCIAL HISTORY: Social History   Socioeconomic History   Marital status:  Married    Spouse name: Not on file   Number of children: 2   Years of education: Not on file   Highest education level: Not on file  Occupational History   Not on file  Tobacco Use   Smoking status: Never   Smokeless tobacco: Never  Vaping Use   Vaping status: Never Used  Substance and Sexual Activity   Alcohol use: No   Drug use: Never   Sexual activity: Not on file  Other Topics Concern   Not on file  Social History Narrative   Not on file   Social Drivers of Health   Financial Resource Strain: Low Risk  (10/06/2020)   Overall Financial Resource Strain (CARDIA)    Difficulty of Paying Living Expenses: Not hard  at all  Food Insecurity: No Food Insecurity (03/29/2022)   Hunger Vital Sign    Worried About Running Out of Food in the Last Year: Never true    Ran Out of Food in the Last Year: Never true  Transportation Needs: No Transportation Needs (03/29/2022)   PRAPARE - Administrator, Civil Service (Medical): No    Lack of Transportation (Non-Medical): No  Physical Activity: Inactive (10/06/2020)   Exercise Vital Sign    Days of Exercise per Week: 0 days    Minutes of Exercise per Session: 0 min  Stress: Not on file  Social Connections: Not on file  Intimate Partner Violence: Not on file     PHYSICAL EXAM  GENERAL EXAM/CONSTITUTIONAL: Vitals:  Vitals:   07/04/23 0933  BP: 138/66  Pulse: 60  Weight: 174 lb 12.8 oz (79.3 kg)  Height: 5' 6 (1.676 m)   Body mass index is 28.21 kg/m. Wt Readings from Last 3 Encounters:  07/04/23 174 lb 12.8 oz (79.3 kg)  05/23/23 176 lb 8 oz (80.1 kg)  03/29/23 173 lb 9.6 oz (78.7 kg)   Patient is in no distress; well developed, nourished and groomed; neck is supple  CARDIOVASCULAR: Examination of carotid arteries is normal; no carotid bruits Regular rate and rhythm, no murmurs Examination of peripheral vascular system by observation and palpation is normal  EYES: Ophthalmoscopic exam of optic discs and  posterior segments is normal; no papilledema or hemorrhages No results found.  MUSCULOSKELETAL: Gait, strength, tone, movements noted in Neurologic exam below  NEUROLOGIC: MENTAL STATUS:      No data to display         awake, alert, oriented to person, place and time recent and remote memory intact normal attention and concentration language fluent, comprehension intact, naming intact fund of knowledge appropriate  CRANIAL NERVE:  2nd - no papilledema on fundoscopic exam 2nd, 3rd, 4th, 6th - pupils equal and reactive to light, visual fields full to confrontation, extraocular muscles intact; MILD NYSTAGMUS ON LEFT GAZE (3-4 BEATS END GAZE) 5th - facial sensation symmetric 7th - facial strength symmetric 8th - hearing intact 9th - palate elevates symmetrically, uvula midline 11th - shoulder shrug symmetric 12th - tongue protrusion midline  MOTOR:  normal bulk and tone, full strength in the BUE, BLE; EXCEPT RIGHT FOOT DF 4 (LIMITED BY PAIN)  SENSORY:  normal and symmetric to light touch, temperature, vibration; ABSENT VIB AT TOES AND ANKLES  COORDINATION:  finger-nose-finger, fine finger movements normal  REFLEXES:  deep tendon reflexes TRACE and symmetric  GAIT/STATION:  SLIGHTLY WIDER GAIT; SHORT STEPS; SLOW AND CAUTIOUS    DIAGNOSTIC DATA (LABS, IMAGING, TESTING) - I reviewed patient records, labs, notes, testing and imaging myself where available.  Lab Results  Component Value Date   WBC 6.4 12/13/2020   HGB 11.4 12/13/2020   HCT 34.1 12/13/2020   MCV 91 12/13/2020   PLT 216 12/13/2020      Component Value Date/Time   NA 141 11/25/2021 1501   K 4.4 11/25/2021 1501   CL 102 11/25/2021 1501   CO2 24 11/25/2021 1501   GLUCOSE 125 (H) 11/25/2021 1501   GLUCOSE 193 (H) 05/07/2020 1113   BUN 39 (H) 11/25/2021 1501   CREATININE 1.67 (H) 11/25/2021 1501   CALCIUM  9.9 11/25/2021 1501   PROT 7.0 05/07/2020 1113   PROT 7.1 12/18/2018 1510   ALBUMIN 3.8  05/07/2020 1113   ALBUMIN 4.5 12/18/2018 1510   AST 20 05/07/2020 1113  ALT 16 05/07/2020 1113   ALKPHOS 150 (H) 05/07/2020 1113   BILITOT 1.0 05/07/2020 1113   BILITOT 0.4 12/18/2018 1510   GFRNONAA 38 (L) 05/07/2020 1113   GFRAA 38 (L) 03/19/2020 1516   No results found for: CHOL, HDL, LDLCALC, LDLDIRECT, TRIG, CHOLHDL No results found for: WUJW1X No results found for: VITAMINB12 Lab Results  Component Value Date   TSH 0.831 12/18/2018     ASSESSMENT AND PLAN  73 y.o. year old female here with:  Dx:  1. Dizziness   2. Gait difficulty     PLAN:  GAIT / BALANCE DIFFICULTY (likely multi-factorial, diabetic neuropathy, right foot pain, low back pain, age, deconditioning) - recommend to resume PT evaluation / exercises - use cane / walker for fall prevention - intermittent motion sensitivity with head movements (patient has hard time describing) --> check MRI brain (rule out stroke, autoimmune, inflamm, mass)   Orders Placed This Encounter  Procedures   MR BRAIN W WO CONTRAST   Ambulatory referral to Physical Therapy   Return for pending if symptoms worsen or fail to improve, pending test results.    Omega Bible, MD 07/04/2023, 10:15 AM Certified in Neurology, Neurophysiology and Neuroimaging  [facility] [address], [address] [place], [place] [address] [phone]

## 2023-07-11 DIAGNOSIS — R945 Abnormal results of liver function studies: Secondary | ICD-10-CM | POA: Diagnosis not present

## 2023-07-11 DIAGNOSIS — R198 Other specified symptoms and signs involving the digestive system and abdomen: Secondary | ICD-10-CM | POA: Diagnosis not present

## 2023-07-11 DIAGNOSIS — R932 Abnormal findings on diagnostic imaging of liver and biliary tract: Secondary | ICD-10-CM | POA: Diagnosis not present

## 2023-07-11 DIAGNOSIS — R1310 Dysphagia, unspecified: Secondary | ICD-10-CM | POA: Diagnosis not present

## 2023-07-12 ENCOUNTER — Other Ambulatory Visit: Payer: Self-pay | Admitting: Gastroenterology

## 2023-07-12 ENCOUNTER — Other Ambulatory Visit: Payer: Self-pay

## 2023-07-12 ENCOUNTER — Ambulatory Visit: Attending: Diagnostic Neuroimaging | Admitting: Physical Therapy

## 2023-07-12 DIAGNOSIS — R1310 Dysphagia, unspecified: Secondary | ICD-10-CM

## 2023-07-12 DIAGNOSIS — R269 Unspecified abnormalities of gait and mobility: Secondary | ICD-10-CM | POA: Diagnosis not present

## 2023-07-12 DIAGNOSIS — R932 Abnormal findings on diagnostic imaging of liver and biliary tract: Secondary | ICD-10-CM

## 2023-07-12 DIAGNOSIS — R2681 Unsteadiness on feet: Secondary | ICD-10-CM | POA: Insufficient documentation

## 2023-07-12 DIAGNOSIS — R2689 Other abnormalities of gait and mobility: Secondary | ICD-10-CM | POA: Diagnosis not present

## 2023-07-12 DIAGNOSIS — R42 Dizziness and giddiness: Secondary | ICD-10-CM | POA: Insufficient documentation

## 2023-07-12 NOTE — Therapy (Signed)
 OUTPATIENT PHYSICAL THERAPY VESTIBULAR EVALUATION     Patient Name: Courtney Pearson MRN: [id number] DOB:[birth year], 73 y.o., female Today's Date: 07/12/2023  END OF SESSION:  PT End of Session - 07/12/23 1018     Visit Number 1    Number of Visits 13    Date for PT Re-Evaluation 08/24/23    Authorization Type Medicare/AARP    Progress Note Due on Visit 10    PT Start Time 0937    PT Stop Time 1018    PT Time Calculation (min) 41 min    Activity Tolerance Patient tolerated treatment well    Behavior During Therapy [facility] for tasks assessed/performed          Past Medical History:  Diagnosis Date   Acute on chronic diastolic heart failure (HCC) 03/12/2020   Atypical atrial flutter (HCC) 03/12/2020   CAD in native artery 03/20/2019   Chronic diastolic heart failure (HCC) 03/12/2020   Diabetes mellitus type 1 (HCC) 1969   History of anemia due to chronic kidney disease 08/29/2022   HTN (hypertension)    Hyperlipidemia    Hypothyroidism    Mitral stenosis 11/16/2021   Osteoporosis 2024   Palpitation    Pancytopenia (HCC) 08/29/2022   Persistent atrial fibrillation (HCC) 04/03/2017   Renal insufficiency    Sick sinus syndrome (HCC) 07/06/2020   Weakness 10/06/2020   Past Surgical History:  Procedure Laterality Date   ATRIAL FIBRILLATION ABLATION N/A 01/01/2018   Procedure: ATRIAL FIBRILLATION ABLATION;  Surgeon: Jolly Needle, MD;  [facility]: [facility];  Service: Cardiovascular;  Laterality: N/A;   ATRIAL FIBRILLATION ABLATION N/A 10/31/2018   Procedure: ATRIAL FIBRILLATION ABLATION;  Surgeon: Jolly Needle, MD;  Location: [facility];  Service: Cardiovascular;  Laterality: N/A;   ATRIAL FIBRILLATION ABLATION N/A 01/06/2021   Procedure: ATRIAL FIBRILLATION ABLATION;  Surgeon: Jolly Needle, MD;  Location: [facility];  Service: Cardiovascular;  Laterality: N/A;   BREAST BIOPSY     BREAST EXCISIONAL BIOPSY Right 1993   CARDIOVERSION N/A 04/30/2017    Procedure: CARDIOVERSION;  Surgeon: Maudine Sos, MD;  Location: [facility] ENDOSCOPY;  Service: Cardiovascular;  Laterality: N/A;   CARDIOVERSION N/A 02/15/2018   Procedure: CARDIOVERSION;  Surgeon: Maudine Sos, MD;  Location: [facility] ENDOSCOPY;  Service: Cardiovascular;  Laterality: N/A;   CARDIOVERSION N/A 04/10/2018   Procedure: CARDIOVERSION;  Surgeon: Loyde Rule, MD;  Location[facility] ENDOSCOPY;  Service: Cardiovascular;  Laterality: N/A;   CESAREAN SECTION     EYE SURGERY     Patient Active Problem List   Diagnosis Date Noted   History of anemia due to chronic kidney disease 08/29/2022   Pancytopenia (HCC) 08/29/2022   Hypothyroidism    Pain due to onychomycosis of toenails of both feet 03/24/2022   Mitral stenosis 11/16/2021   Weakness 10/06/2020   Sick sinus syndrome (HCC) 07/06/2020   Chronic diastolic heart failure (HCC) 03/12/2020   Atypical atrial flutter (HCC) 03/12/2020   CAD in native artery 03/20/2019   Persistent atrial fibrillation (HCC)    Dyspnea 10/30/2012   Palpitations 10/30/2012   Essential hypertension 10/30/2012   Hyperlipidemia 10/30/2012   Diabetes mellitus type 1 (HCC) 1969    PCP: Olin Bertin, MD  REFERRING PROVIDER: Omega Bible, MD   REFERRING DIAG:  R42 (ICD-10-CM) - Dizziness  R26.9 (ICD-10-CM) - Gait difficulty    THERAPY DIAG:  Dizziness and giddiness  Unsteadiness on feet  Other abnormalities of gait and mobility  ONSET DATE: 07/04/2023 (MD referral)-symptoms  began Feb 2025  Rationale for Evaluation and Treatment: Rehabilitation  SUBJECTIVE:   SUBJECTIVE STATEMENT: The neurologist that I saw referred me to therapy.  I have noticed that my head feels kind of swimmy.  Not really like lightheaded, just off-balance.  I do feel off-balance in my head and neck with certain movements. Pt accompanied by: self  PERTINENT HISTORY: DM, CAD, chronic diastolic heart failure, HTN, HLD, osteoporosis, a-fib, neuropathy (per Dr.  Elon Hakim note-absent vibration sense at toes and ankles)  PAIN:  Are you having pain? No  PRECAUTIONS: Fall  RED FLAGS: None   WEIGHT BEARING RESTRICTIONS: No  FALLS: Has patient fallen in last 6 months? No  LIVING ENVIRONMENT: Lives with: lives with their spouse Lives in: House/apartment Stairs: several steps to enter; bedroom on floor Has following equipment at home: Single point cane  PLOF: Independent and Leisure: Enjoys participating in choir  PATIENT GOALS: To help improve things for dizziness, balance  OBJECTIVE:  Note: Objective measures were completed at Evaluation unless otherwise noted.  DIAGNOSTIC FINDINGS: MRI is scheduled for later in June 2025  COGNITION: Overall cognitive status: Within functional limits for tasks assessed   SENSATION: See above-decreased sensation toes and ankles  POSTURE:  rounded shoulders, forward head, and posterior pelvic tilt  Cervical ROM:    Active A/PROM (deg) eval  Flexion 33  Extension 25  Right lateral flexion   Left lateral flexion   Right rotation 45  Left rotation 50  (Blank rows = not tested)   TRANSFERS: Assistive device utilized: BUEs from chair  Sit to stand: Modified independence Stand to sit: Modified independence   GAIT: Gait pattern: step through pattern, decreased step length- Right, decreased step length- Left, and wide BOS Distance walked: 50 ft Assistive device utilized: None Level of assistance: SBA Comments: Decreased push-off with gait   PATIENT SURVEYS:  DHI 28  VESTIBULAR ASSESSMENT:  GENERAL OBSERVATION: No acute distress   SYMPTOM BEHAVIOR:  Subjective history: Dizziness has been going on for several months, more of an off-balance feeling.  Denies falls, hx of migraines. Does have hx of diabetic retinopathy, neuropathy.  Has had vertigo in the past and this is different.   Non-Vestibular symptoms: neck tightness  Type of dizziness: Imbalance (Disequilibrium), Unsteady with  head/body turns, and Swimmyheaded  Frequency: daily  Duration: comes and goes  Aggravating factors: Induced by motion: looking up at the ceiling, bending down to the ground, turning body quickly, and turning head quickly  Relieving factors: slow movements and hold onto something  Progression of symptoms: better  OCULOMOTOR EXAM:  Wears progressive lenses  Ocular Alignment: normal  Ocular ROM: No Limitations  Spontaneous Nystagmus: absent  Gaze-Induced Nystagmus: slight extra L eye movements at end range   Smooth Pursuits: intact  Saccades: intact  Convergence/Divergence: unable; L eye abducts about 3-4 cm   VESTIBULAR - OCULAR REFLEX:   Slow VOR: Comment: mild symptoms of dizziness (4/10) and neck tightness; mild dizziness (4/10) and neck tightness  VOR Cancellation: Comment: no dizziness, just neck tightness  Head-Impulse Test: HIT Right: positive HIT Left: negative  Dynamic Visual Acuity: NT   POSITIONAL TESTING: Other: NT at eval due to pt not reporting any dizziness, spinning sensations  MOTION SENSITIVITY: NT at EVAL  Motion Sensitivity Quotient Intensity: 0 = none, 1 = Lightheaded, 2 = Mild, 3 = Moderate, 4 = Severe, 5 = Vomiting  Intensity  1. Sitting to supine   2. Supine to L side   3. Supine to R  side   4. Supine to sitting   5. L Hallpike-Dix   6. Up from L    7. R Hallpike-Dix   8. Up from R    9. Sitting, head tipped to L knee   10. Head up from L knee   11. Sitting, head tipped to R knee   12. Head up from R knee   13. Sitting head turns x5   14.Sitting head nods x5   15. In stance, 180 turn to L    16. In stance, 180 turn to R     OTHOSTATICS: not done  FUNCTIONAL GAIT: 10 meter walk test: 18.78 sec = 1.75 ft/sec   M-CTSIB  Condition 1: Firm Surface, EO 30 Sec, Normal Sway  Condition 2: Firm Surface, EC 30 Sec, Mild Sway  Condition 3: Foam Surface, EO 30 Sec, Mild Sway  Condition 4: Foam Surface, EC 2.75 Sec, Severe Sway                                                                                                                                TREATMENT DATE: 07/12/2023    PATIENT EDUCATION: Education details: Eval findings, POC Person educated: Patient Education method: Explanation Education comprehension: verbalized understanding  HOME EXERCISE PROGRAM: Not yet initiated  GOALS: Goals reviewed with patient? Yes  SHORT TERM GOALS: Target date: 08/10/2023  Pt will be independent with HEP for improved dizziness, balance. Baseline: Goal status: INITIAL  2.  Pt will perform Condition 4 on MCTSIB for at least 15 sec, for improved balance. Baseline:  Goal status: INITIAL  3.  Pt will improve cervical range of motion in all directions by 5 degrees with no c/o increased dizziness for improved functional mboility.  Baseline:  Goal status: INITIAL  LONG TERM GOALS: Target date: 08/24/2023  Pt will be independent with HEP for improved dizziness and balance.. Baseline:  Goal status: INITIAL  2.  DGI score to be assessed, to improve to at least 19/24 for decreased fall risk. Baseline: TBA Goal status: INITIAL  3.  Pt will perform Condition 4 on MCTSIB to at least 30 sec mod sway or less, for improved balance. Baseline: 2.75 sec Goal status: INITIAL  4.  Pt will improve DHI score to less than or equal to 10, to demo decreased dizziness affecting daily activities. Baseline: 28 Goal status: INITIAL  5.  Pt will improve gait velocity to at least 2 ft/sec for improved gait efficiency and safety. Baseline: 1.75 ft/sec Goal status: INITIAL   ASSESSMENT:  CLINICAL IMPRESSION: Patient is a 73 y.o. female who was seen today for physical therapy evaluation and treatment for dizziness, gait difficulty. She reports dizziness began around February 2025, and she describes it as a swimmy feeling in the head and being off-balance.  She denies any spinning sensation, (she has had vertigo in the past and this does not feel the  same), so positional testing not performed in  eval.  She has hx of diabetic retinopathy and decreased peripheral vision in L eye, neuropathy, vertigo; she denies hx of migraines, falls or recent viral illness.  With oculomotor testing, she has some extraocular motion on L eye with end range (?nystagmus) and she has L eye abduction with near point convergence.  She has symptoms of dizziness as well as neck tightness with slowed VOR.  With MCTSIB testing, she is unable to perform condition 4 beyond 2-3 seconds (compared to 20 sec 2 years ago when pt had PT at this clinic), indicating decreased vestibular system function.  She would benefit from skilled PT to address neck flexibility, balance, posture, dizziness, gait for improved overall functional mobility and decreased fall risk.    OBJECTIVE IMPAIRMENTS: Abnormal gait, decreased balance, decreased mobility, difficulty walking, decreased strength, dizziness, and postural dysfunction.   ACTIVITY LIMITATIONS: bending, standing, transfers, reach over head, and locomotion level  PARTICIPATION LIMITATIONS: community activity, church, and choir  PERSONAL FACTORS: 3+ comorbidities: See above are also affecting patient's functional outcome.   REHAB POTENTIAL: Good  CLINICAL DECISION MAKING: Stable/uncomplicated  EVALUATION COMPLEXITY: Low   PLAN:  PT FREQUENCY: 2x/week  PT DURATION: 6 weeks plus eval  PLANNED INTERVENTIONS: 97750- Physical Performance Testing, 97110-Therapeutic exercises, 97530- Therapeutic activity, 97112- Neuromuscular re-education, 97535- Self Care, 40981- Manual therapy, [phone]- Gait training, Patient/Family education, Balance training, and Vestibular training  PLAN FOR NEXT SESSION: Assess DGI, work on Editor, commissioning, VOR exercises   Nikyah Lackman W., PT 07/12/2023, 11:14 AM  [facility] [address], [address] [place], [place] [address] Phone # [phone] Fax  # [phone]

## 2023-07-16 NOTE — Therapy (Signed)
 OUTPATIENT PHYSICAL THERAPY VESTIBULAR TREATMENT     Patient Name: Courtney Pearson MRN: [id number] DOB:[birth year], 73 y.o., female Today's Date: 07/17/2023  END OF SESSION:  PT End of Session - 07/17/23 1144     Visit Number 2    Number of Visits 13    Date for PT Re-Evaluation 08/24/23    Authorization Type Medicare/[employer]    Progress Note Due on Visit 10    PT Start Time 1101    PT Stop Time 1142    PT Time Calculation (min) 41 min    Equipment Utilized During Treatment Gait belt    Activity Tolerance Patient tolerated treatment well    Behavior During Therapy WFL for tasks assessed/performed           Past Medical History:  Diagnosis Date   Acute on chronic diastolic heart failure (HCC) 03/12/2020   Atypical atrial flutter (HCC) 03/12/2020   CAD in native artery 03/20/2019   Chronic diastolic heart failure (HCC) 03/12/2020   Diabetes mellitus type 1 (HCC) 1969   History of anemia due to chronic kidney disease 08/29/2022   HTN (hypertension)    Hyperlipidemia    Hypothyroidism    Mitral stenosis 11/16/2021   Osteoporosis 2024   Palpitation    Pancytopenia (HCC) 08/29/2022   Persistent atrial fibrillation (HCC) 04/03/2017   Renal insufficiency    Sick sinus syndrome (HCC) 07/06/2020   Weakness 10/06/2020   Past Surgical History:  Procedure Laterality Date   ATRIAL FIBRILLATION ABLATION N/A 01/01/2018   Procedure: ATRIAL FIBRILLATION ABLATION;  Surgeon: Kelsie Agent, MD;  Location: [facility];  Service: Cardiovascular;  Laterality: N/A;   ATRIAL FIBRILLATION ABLATION N/A 10/31/2018   Procedure: ATRIAL FIBRILLATION ABLATION;  Surgeon: Kelsie Agent, MD;  Location: [facility];  Service: Cardiovascular;  Laterality: N/A;   ATRIAL FIBRILLATION ABLATION N/A 01/06/2021   Procedure: ATRIAL FIBRILLATION ABLATION;  Surgeon: Kelsie Agent, MD;  Location: [facility];  Service: Cardiovascular;  Laterality: N/A;   BREAST BIOPSY     BREAST  EXCISIONAL BIOPSY Right 1993   CARDIOVERSION N/A 04/30/2017   Procedure: CARDIOVERSION;  Surgeon: Raford Riggs, MD;  Location: [facility] ENDOSCOPY;  Service: Cardiovascular;  Laterality: N/A;   CARDIOVERSION N/A 02/15/2018   Procedure: CARDIOVERSION;  Surgeon: Raford Riggs, MD;  Location: [facility] ENDOSCOPY;  Service: Cardiovascular;  Laterality: N/A;   CARDIOVERSION N/A 04/10/2018   Procedure: CARDIOVERSION;  Surgeon: Delford Maude BROCKS, MD;  Location: [facility] ENDOSCOPY;  Service: Cardiovascular;  Laterality: N/A;   CESAREAN SECTION     EYE SURGERY     Patient Active Problem List   Diagnosis Date Noted   History of anemia due to chronic kidney disease 08/29/2022   Pancytopenia (HCC) 08/29/2022   Hypothyroidism    Pain due to onychomycosis of toenails of both feet 03/24/2022   Mitral stenosis 11/16/2021   Weakness 10/06/2020   Sick sinus syndrome (HCC) 07/06/2020   Chronic diastolic heart failure (HCC) 03/12/2020   Atypical atrial flutter (HCC) 03/12/2020   CAD in native artery 03/20/2019   Persistent atrial fibrillation (HCC)    Dyspnea 10/30/2012   Palpitations 10/30/2012   Essential hypertension 10/30/2012   Hyperlipidemia 10/30/2012   Diabetes mellitus type 1 (HCC) 1969    PCP: Verena Mems, MD  REFERRING PROVIDER: Margaret Eduard SAUNDERS, MD   REFERRING DIAG:  R42 (ICD-10-CM) - Dizziness  R26.9 (ICD-10-CM) - Gait difficulty    THERAPY DIAG:  Dizziness and giddiness  Unsteadiness on feet  Other abnormalities  of gait and mobility  ONSET DATE: 07/04/2023 (MD referral)-symptoms began Feb 2025  Rationale for Evaluation and Treatment: Rehabilitation  SUBJECTIVE:   SUBJECTIVE STATEMENT: No changes. Patient reports that balance is her main priority. Has a cane that she uses for loinger distanced but my pride gets in the way.   Pt accompanied by: self  PERTINENT HISTORY: DM, CAD, chronic diastolic heart failure, HTN, HLD, osteoporosis, a-fib, neuropathy (per Dr. Chancy  note-absent vibration sense at toes and ankles)  PAIN:  Are you having pain? No  PRECAUTIONS: Fall  RED FLAGS: None   WEIGHT BEARING RESTRICTIONS: No  FALLS: Has patient fallen in last 6 months? No  LIVING ENVIRONMENT: Lives with: lives with their spouse Lives in: House/apartment Stairs: several steps to enter; bedroom on floor Has following equipment at home: Single point cane  PLOF: Independent and Leisure: Enjoys participating in choir  PATIENT GOALS: To help improve things for dizziness, balance  OBJECTIVE:       TODAY'S TREATMENT: 07/17/23 Activity Comments  DGI 9/24   Wide BOS lateral wt shift Good performance   Staggered ant/pos wt shift Weaning UE support   romberg EO/EC 30 Cues for core and hip contraction to reduce upper body sway   romberg EO/EC + head nods/turns 30 Moderate sway; no dizziness   Alt toe tap on 6 step, then wt shift + step Focus on control, wt shift           [facility] PT Assessment - 07/17/23 0001       Standardized Balance Assessment   Standardized Balance Assessment Dynamic Gait Index      Dynamic Gait Index   Level Surface Moderate Impairment    Change in Gait Speed Moderate Impairment    Gait with Horizontal Head Turns Moderate Impairment    Gait with Vertical Head Turns Moderate Impairment    Gait and Pivot Turn Mild Impairment    Step Over Obstacle Severe Impairment    Step Around Obstacles Moderate Impairment    Steps Mild Impairment    Total Score 9            HOME EXERCISE PROGRAM Last updated: 07/17/23 Access Code: [email or web address] URL: [email or web address] Date: 07/17/2023 Prepared by: [facility] - Outpatient  Rehab - [facility]  Program Notes perform exercises at counter top or in corner for safety  Exercises - Staggered Stance Forward Backward Weight Shift with Counter Support  - 1 x daily - 5 x weekly - 2 sets - 10 reps - Side to Side Weight Shift with Counter Support  - 1 x daily - 5 x  weekly - 2 sets - 10 reps - Romberg Stance with Head Nods  - 1 x daily - 5 x weekly - 2-3 sets - 30 sec hold - Romberg Stance with Head Rotation  - 1 x daily - 5 x weekly - 2-3 sets - 30 sec hold - Romberg Stance with Eyes Closed  - 1 x daily - 5 x weekly - 2-3 sets - 30 sec hold   PATIENT EDUCATION: Education details: discussion on pt's symptoms and balance test, HEP with edu for safety Person educated: Patient Education method: Explanation, Demonstration, Tactile cues, Verbal cues, and Handouts Education comprehension: verbalized understanding and returned demonstration     Note: Objective measures were completed at Evaluation unless otherwise noted.  DIAGNOSTIC FINDINGS: MRI is scheduled for later in June 2025  COGNITION: Overall cognitive status: Within functional limits for tasks assessed   SENSATION: See above-decreased sensation  toes and ankles  POSTURE:  rounded shoulders, forward head, and posterior pelvic tilt  Cervical ROM:    Active A/PROM (deg) eval  Flexion 33  Extension 25  Right lateral flexion   Left lateral flexion   Right rotation 45  Left rotation 50  (Blank rows = not tested)   TRANSFERS: Assistive device utilized: BUEs from chair  Sit to stand: Modified independence Stand to sit: Modified independence   GAIT: Gait pattern: step through pattern, decreased step length- Right, decreased step length- Left, and wide BOS Distance walked: 50 ft Assistive device utilized: None Level of assistance: SBA Comments: Decreased push-off with gait   PATIENT SURVEYS:  DHI 28  VESTIBULAR ASSESSMENT:  GENERAL OBSERVATION: No acute distress   SYMPTOM BEHAVIOR:  Subjective history: Dizziness has been going on for several months, more of an off-balance feeling.  Denies falls, hx of migraines. Does have hx of diabetic retinopathy, neuropathy.  Has had vertigo in the past and this is different.   Non-Vestibular symptoms: neck tightness  Type of  dizziness: Imbalance (Disequilibrium), Unsteady with head/body turns, and Swimmyheaded  Frequency: daily  Duration: comes and goes  Aggravating factors: Induced by motion: looking up at the ceiling, bending down to the ground, turning body quickly, and turning head quickly  Relieving factors: slow movements and hold onto something  Progression of symptoms: better  OCULOMOTOR EXAM:  Wears progressive lenses  Ocular Alignment: normal  Ocular ROM: No Limitations  Spontaneous Nystagmus: absent  Gaze-Induced Nystagmus: slight extra L eye movements at end range   Smooth Pursuits: intact  Saccades: intact  Convergence/Divergence: unable; L eye abducts about 3-4 cm   VESTIBULAR - OCULAR REFLEX:   Slow VOR: Comment: mild symptoms of dizziness (4/10) and neck tightness; mild dizziness (4/10) and neck tightness  VOR Cancellation: Comment: no dizziness, just neck tightness  Head-Impulse Test: HIT Right: positive HIT Left: negative  Dynamic Visual Acuity: NT   POSITIONAL TESTING: Other: NT at eval due to pt not reporting any dizziness, spinning sensations  MOTION SENSITIVITY: NT at EVAL  Motion Sensitivity Quotient Intensity: 0 = none, 1 = Lightheaded, 2 = Mild, 3 = Moderate, 4 = Severe, 5 = Vomiting  Intensity  1. Sitting to supine   2. Supine to L side   3. Supine to R side   4. Supine to sitting   5. L Hallpike-Dix   6. Up from L    7. R Hallpike-Dix   8. Up from R    9. Sitting, head tipped to L knee   10. Head up from L knee   11. Sitting, head tipped to R knee   12. Head up from R knee   13. Sitting head turns x5   14.Sitting head nods x5   15. In stance, 180 turn to L    16. In stance, 180 turn to R     OTHOSTATICS: not done  FUNCTIONAL GAIT: 10 meter walk test: 18.78 sec = 1.75 ft/sec   M-CTSIB  Condition 1: Firm Surface, EO 30 Sec, Normal Sway  Condition 2: Firm Surface, EC 30 Sec, Mild Sway  Condition 3: Foam Surface, EO 30 Sec, Mild Sway  Condition 4: Foam  Surface, EC 2.75 Sec, Severe Sway  TREATMENT DATE: 07/12/2023    PATIENT EDUCATION: Education details: Eval findings, POC Person educated: Patient Education method: Explanation Education comprehension: verbalized understanding  HOME EXERCISE PROGRAM: Not yet initiated  GOALS: Goals reviewed with patient? Yes  SHORT TERM GOALS: Target date: 08/10/2023  Pt will be independent with HEP for improved dizziness, balance. Baseline: Goal status: IN PROGRESS  2.  Pt will perform Condition 4 on MCTSIB for at least 15 sec, for improved balance. Baseline:  Goal status: IN PROGRESS  3.  Pt will improve cervical range of motion in all directions by 5 degrees with no c/o increased dizziness for improved functional mboility.  Baseline:  Goal status: IN PROGRESS  LONG TERM GOALS: Target date: 08/24/2023  Pt will be independent with HEP for improved dizziness and balance.. Baseline:  Goal status: IN PROGRESS  2.  DGI score to be assessed, to improve to at least 19/24 for decreased fall risk. Baseline: TBA Goal status: IN PROGRESS  3.  Pt will perform Condition 4 on MCTSIB to at least 30 sec mod sway or less, for improved balance. Baseline: 2.75 sec Goal status: IN PROGRESS  4.  Pt will improve DHI score to less than or equal to 10, to demo decreased dizziness affecting daily activities. Baseline: 28 Goal status: IN PROGRESS  5.  Pt will improve gait velocity to at least 2 ft/sec for improved gait efficiency and safety. Baseline: 1.75 ft/sec Goal status: IN PROGRESS   ASSESSMENT:  CLINICAL IMPRESSION: Patient arrived to session without complaints. Patient scored 9/24 on DGI, indicating in increased risk of falls. Initiated balance activities focusing on weight shifting to improve foot clearance when ambulating. Also performed balance challenges with head  movements and stepping strategy. Patient demonstrated good response and focus on corrective cueing. Reported understanding of HEP and without complaints upon leaving.   OBJECTIVE IMPAIRMENTS: Abnormal gait, decreased balance, decreased mobility, difficulty walking, decreased strength, dizziness, and postural dysfunction.   ACTIVITY LIMITATIONS: bending, standing, transfers, reach over head, and locomotion level  PARTICIPATION LIMITATIONS: community activity, church, and choir  PERSONAL FACTORS: 3+ comorbidities: See above are also affecting patient's functional outcome.   REHAB POTENTIAL: Good  CLINICAL DECISION MAKING: Stable/uncomplicated  EVALUATION COMPLEXITY: Low   PLAN:  PT FREQUENCY: 2x/week  PT DURATION: 6 weeks plus eval  PLANNED INTERVENTIONS: 97750- Physical Performance Testing, 97110-Therapeutic exercises, 97530- Therapeutic activity, 97112- Neuromuscular re-education, 97535- Self Care, [id number]- Manual therapy, [phone]- Gait training, Patient/Family education, Balance training, and Vestibular training  PLAN FOR NEXT SESSION: review HEP; wt shifting and gait training to address shuffling; work on Editor, commissioning, VOR exercises  Louana Terrilyn Christians, [place], DPT 07/17/23 11:46 AM  [facility] at [facility] [address] [place], [place] [address] Phone # [phone] Fax # [phone]

## 2023-07-17 ENCOUNTER — Encounter: Payer: Self-pay | Admitting: Physical Therapy

## 2023-07-17 ENCOUNTER — Ambulatory Visit: Admitting: Physical Therapy

## 2023-07-17 DIAGNOSIS — R2681 Unsteadiness on feet: Secondary | ICD-10-CM | POA: Diagnosis not present

## 2023-07-17 DIAGNOSIS — R2689 Other abnormalities of gait and mobility: Secondary | ICD-10-CM | POA: Diagnosis not present

## 2023-07-17 DIAGNOSIS — R42 Dizziness and giddiness: Secondary | ICD-10-CM | POA: Diagnosis not present

## 2023-07-17 DIAGNOSIS — R269 Unspecified abnormalities of gait and mobility: Secondary | ICD-10-CM | POA: Diagnosis not present

## 2023-07-18 ENCOUNTER — Ambulatory Visit
Admission: RE | Admit: 2023-07-18 | Discharge: 2023-07-18 | Disposition: A | Discharge status: Home / Self Care | Source: Ambulatory Visit | Attending: Diagnostic Neuroimaging | Admitting: Diagnostic Neuroimaging

## 2023-07-18 DIAGNOSIS — R269 Unspecified abnormalities of gait and mobility: Secondary | ICD-10-CM

## 2023-07-18 DIAGNOSIS — R42 Dizziness and giddiness: Secondary | ICD-10-CM | POA: Diagnosis not present

## 2023-07-18 MED ORDER — GADOTERIDOL 279.3 MG/ML IV SOLN
7.5000 mL | Freq: Once | INTRAVENOUS | Status: AC | PRN
  Administered 2023-07-18: 7.5 mL via INTRAVENOUS

## 2023-07-20 ENCOUNTER — Encounter: Payer: Self-pay | Admitting: Physical Therapy

## 2023-07-20 ENCOUNTER — Ambulatory Visit: Admitting: Physical Therapy

## 2023-07-20 ENCOUNTER — Ambulatory Visit: Payer: Self-pay | Admitting: Diagnostic Neuroimaging

## 2023-07-20 DIAGNOSIS — R42 Dizziness and giddiness: Secondary | ICD-10-CM

## 2023-07-20 DIAGNOSIS — R2689 Other abnormalities of gait and mobility: Secondary | ICD-10-CM

## 2023-07-20 DIAGNOSIS — R2681 Unsteadiness on feet: Secondary | ICD-10-CM

## 2023-07-20 DIAGNOSIS — R269 Unspecified abnormalities of gait and mobility: Secondary | ICD-10-CM | POA: Diagnosis not present

## 2023-07-20 NOTE — Therapy (Signed)
 OUTPATIENT PHYSICAL THERAPY VESTIBULAR TREATMENT     Patient Name: Courtney Pearson MRN: [id number] DOB:10-Feb-1950, 73 y.o., female Today's Date: 07/20/2023  END OF SESSION:  PT End of Session - 07/20/23 0853     Visit Number 3    Number of Visits 13    Date for PT Re-Evaluation 08/24/23    Authorization Type Medicare/[employer]    Progress Note Due on Visit 10    PT Start Time [phone]    PT Stop Time 0930    PT Time Calculation (min) 38 min    Equipment Utilized During Treatment Gait belt    Activity Tolerance Patient tolerated treatment well    Behavior During Therapy WFL for tasks assessed/performed            Past Medical History:  Diagnosis Date   Acute on chronic diastolic heart failure (HCC) 03/12/2020   Atypical atrial flutter (HCC) 03/12/2020   CAD in native artery 03/20/2019   Chronic diastolic heart failure (HCC) 03/12/2020   Diabetes mellitus type 1 (HCC) 1969   History of anemia due to chronic kidney disease 08/29/2022   HTN (hypertension)    Hyperlipidemia    Hypothyroidism    Mitral stenosis 11/16/2021   Osteoporosis 2024   Palpitation    Pancytopenia (HCC) 08/29/2022   Persistent atrial fibrillation (HCC) 04/03/2017   Renal insufficiency    Sick sinus syndrome (HCC) 07/06/2020   Weakness 10/06/2020   Past Surgical History:  Procedure Laterality Date   ATRIAL FIBRILLATION ABLATION N/A 01/01/2018   Procedure: ATRIAL FIBRILLATION ABLATION;  Surgeon: Kelsie Agent, MD;  Location: [facility];  Service: Cardiovascular;  Laterality: N/A;   ATRIAL FIBRILLATION ABLATION N/A 10/31/2018   Procedure: ATRIAL FIBRILLATION ABLATION;  Surgeon: Kelsie Agent, MD;  Location: [facility];  Service: Cardiovascular;  Laterality: N/A;   ATRIAL FIBRILLATION ABLATION N/A 01/06/2021   Procedure: ATRIAL FIBRILLATION ABLATION;  Surgeon: Kelsie Agent, MD;  Location: [facility];  Service: Cardiovascular;  Laterality: N/A;   BREAST BIOPSY     BREAST  EXCISIONAL BIOPSY Right 1993   CARDIOVERSION N/A 04/30/2017   Procedure: CARDIOVERSION;  Surgeon: Raford Riggs, MD;  Location: [facility] ENDOSCOPY;  Service: Cardiovascular;  Laterality: N/A;   CARDIOVERSION N/A 02/15/2018   Procedure: CARDIOVERSION;  Surgeon: Raford Riggs, MD;  Location: [facility] ENDOSCOPY;  Service: Cardiovascular;  Laterality: N/A;   CARDIOVERSION N/A 04/10/2018   Procedure: CARDIOVERSION;  Surgeon: Delford Maude BROCKS, MD;  Location: [facility] ENDOSCOPY;  Service: Cardiovascular;  Laterality: N/A;   CESAREAN SECTION     EYE SURGERY     Patient Active Problem List   Diagnosis Date Noted   History of anemia due to chronic kidney disease 08/29/2022   Pancytopenia (HCC) 08/29/2022   Hypothyroidism    Pain due to onychomycosis of toenails of both feet 03/24/2022   Mitral stenosis 11/16/2021   Weakness 10/06/2020   Sick sinus syndrome (HCC) 07/06/2020   Chronic diastolic heart failure (HCC) 03/12/2020   Atypical atrial flutter (HCC) 03/12/2020   CAD in native artery 03/20/2019   Persistent atrial fibrillation (HCC)    Dyspnea 10/30/2012   Palpitations 10/30/2012   Essential hypertension 10/30/2012   Hyperlipidemia 10/30/2012   Diabetes mellitus type 1 (HCC) 1969    PCP: Verena Mems, MD  REFERRING PROVIDER: Margaret Eduard SAUNDERS, MD   REFERRING DIAG:  R42 (ICD-10-CM) - Dizziness  R26.9 (ICD-10-CM) - Gait difficulty    THERAPY DIAG:  Dizziness and giddiness  Unsteadiness on feet  Other  abnormalities of gait and mobility  ONSET DATE: 07/04/2023 (MD referral)-symptoms began Feb 2025  Rationale for Evaluation and Treatment: Rehabilitation  SUBJECTIVE:   SUBJECTIVE STATEMENT: Got the results of the MRI-unremarkable   Pt accompanied by: self  PERTINENT HISTORY: DM, CAD, chronic diastolic heart failure, HTN, HLD, osteoporosis, a-fib, neuropathy (per Dr. 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 No  PRECAUTIONS:  Fall  RED FLAGS: None   WEIGHT BEARING RESTRICTIONS: No  FALLS: Has patient fallen in last 6 months? No  LIVING ENVIRONMENT: Lives with: lives with their spouse Lives in: House/apartment Stairs: several steps to enter; bedroom on floor Has following equipment at home: Single point cane  PLOF: Independent and Leisure: Enjoys participating in choir  PATIENT GOALS: To help improve things for dizziness, balance  OBJECTIVE:    TODAY'S TREATMENT: 07/20/2023 Activity Comments  Review of HEP-see below Cues for technique with stagger stance rocking  Romberg EC + head turns/nods 30 Intermittent UE support, mild sway  Partial tandem EO + head turns/nods 30 EC 30 sec Mod sway  Heel toe raises limited  Sidestep x 1 min Then added resistance at ankles x 1 min Red band  Forward/back walking x 2 min at counter Cues for heel strike, step length  Alt step taps to 4 step, 2 x 10 BUE support>1 UE support  Forward/back step and weightshift x 10 UE support and cues for increased step length      HOME EXERCISE PROGRAM Access Code: [email or web address] URL: [email or web address] Date: 07/20/2023 Prepared by: [facility] - Outpatient  Rehab - [facility]  Program Notes perform exercises at counter top or in corner for safety  Exercises - Staggered Stance Forward Backward Weight Shift with Counter Support  - 1 x daily - 5 x weekly - 2 sets - 10 reps - Side to Side Weight Shift with Counter Support  - 1 x daily - 5 x weekly - 2 sets - 10 reps - Romberg Stance with Head Nods  - 1 x daily - 5 x weekly - 2-3 sets - 30 sec hold - Romberg Stance with Head Rotation  - 1 x daily - 5 x weekly - 2-3 sets - 30 sec hold - Romberg Stance with Eyes Closed  - 1 x daily - 5 x weekly - 2-3 sets - 30 sec hold - Alternating Step Taps with Counter Support  - 1 x daily - 7 x weekly - 2 sets - 10 reps - Backward Walking with Counter Support  - 1 x daily - 7 x weekly - 1 sets - 3-5  reps        PATIENT EDUCATION: Education details: HEP progression Person educated: Patient Education method: Explanation, Demonstration, Tactile cues, Verbal cues, and Handouts Education comprehension: verbalized understanding and returned demonstration     Note: Objective measures were completed at Evaluation unless otherwise noted.  DIAGNOSTIC FINDINGS: MRI unremarkable  COGNITION: Overall cognitive status: Within functional limits for tasks assessed   SENSATION: See above-decreased sensation toes and ankles  POSTURE:  rounded shoulders, forward head, and posterior pelvic tilt  Cervical ROM:    Active A/PROM (deg) eval  Flexion 33  Extension 25  Right lateral flexion   Left lateral flexion   Right rotation 45  Left rotation 50  (Blank rows = not tested)   TRANSFERS: Assistive device utilized: BUEs from chair  Sit to stand: Modified independence Stand to sit: Modified independence   GAIT: Gait pattern:  step through pattern, decreased step length- Right, decreased step length- Left, and wide BOS Distance walked: 50 ft Assistive device utilized: None Level of assistance: SBA Comments: Decreased push-off with gait   PATIENT SURVEYS:  DHI 28  VESTIBULAR ASSESSMENT:  GENERAL OBSERVATION: No acute distress   SYMPTOM BEHAVIOR:  Subjective history: Dizziness has been going on for several months, more of an off-balance feeling.  Denies falls, hx of migraines. Does have hx of diabetic retinopathy, neuropathy.  Has had vertigo in the past and this is different.   Non-Vestibular symptoms: neck tightness  Type of dizziness: Imbalance (Disequilibrium), Unsteady with head/body turns, and Swimmyheaded  Frequency: daily  Duration: comes and goes  Aggravating factors: Induced by motion: looking up at the ceiling, bending down to the ground, turning body quickly, and turning head quickly  Relieving factors: slow movements and hold onto something  Progression of  symptoms: better  OCULOMOTOR EXAM:  Wears progressive lenses  Ocular Alignment: normal  Ocular ROM: No Limitations  Spontaneous Nystagmus: absent  Gaze-Induced Nystagmus: slight extra L eye movements at end range   Smooth Pursuits: intact  Saccades: intact  Convergence/Divergence: unable; L eye abducts about 3-4 cm   VESTIBULAR - OCULAR REFLEX:   Slow VOR: Comment: mild symptoms of dizziness (4/10) and neck tightness; mild dizziness (4/10) and neck tightness  VOR Cancellation: Comment: no dizziness, just neck tightness  Head-Impulse Test: HIT Right: positive HIT Left: negative  Dynamic Visual Acuity: NT   POSITIONAL TESTING: Other: NT at eval due to pt not reporting any dizziness, spinning sensations  MOTION SENSITIVITY: NT at EVAL  Motion Sensitivity Quotient Intensity: 0 = none, 1 = Lightheaded, 2 = Mild, 3 = Moderate, 4 = Severe, 5 = Vomiting  Intensity  1. Sitting to supine   2. Supine to L side   3. Supine to R side   4. Supine to sitting   5. L Hallpike-Dix   6. Up from L    7. R Hallpike-Dix   8. Up from R    9. Sitting, head tipped to L knee   10. Head up from L knee   11. Sitting, head tipped to R knee   12. Head up from R knee   13. Sitting head turns x5   14.Sitting head nods x5   15. In stance, 180 turn to L    16. In stance, 180 turn to R     OTHOSTATICS: not done  FUNCTIONAL GAIT: 10 meter walk test: 18.78 sec = 1.75 ft/sec   M-CTSIB  Condition 1: Firm Surface, EO 30 Sec, Normal Sway  Condition 2: Firm Surface, EC 30 Sec, Mild Sway  Condition 3: Foam Surface, EO 30 Sec, Mild Sway  Condition 4: Foam Surface, EC 2.75 Sec, Severe Sway  TREATMENT DATE: 07/12/2023    PATIENT EDUCATION: Education details: Eval findings, POC Person educated: Patient Education method: Explanation Education comprehension: verbalized  understanding  HOME EXERCISE PROGRAM: Not yet initiated  GOALS: Goals reviewed with patient? Yes  SHORT TERM GOALS: Target date: 08/10/2023  Pt will be independent with HEP for improved dizziness, balance. Baseline: Goal status: IN PROGRESS  2.  Pt will perform Condition 4 on MCTSIB for at least 15 sec, for improved balance. Baseline:  Goal status: IN PROGRESS  3.  Pt will improve cervical range of motion in all directions by 5 degrees with no c/o increased dizziness for improved functional mboility.  Baseline:  Goal status: IN PROGRESS  LONG TERM GOALS: Target date: 08/24/2023  Pt will be independent with HEP for improved dizziness and balance.. Baseline:  Goal status: IN PROGRESS  2.  DGI score to be assessed, to improve to at least 19/24 for decreased fall risk. Baseline: TBA Goal status: IN PROGRESS  3.  Pt will perform Condition 4 on MCTSIB to at least 30 sec mod sway or less, for improved balance. Baseline: 2.75 sec Goal status: IN PROGRESS  4.  Pt will improve DHI score to less than or equal to 10, to demo decreased dizziness affecting daily activities. Baseline: 28 Goal status: IN PROGRESS  5.  Pt will improve gait velocity to at least 2 ft/sec for improved gait efficiency and safety. Baseline: 1.75 ft/sec Goal status: IN PROGRESS   ASSESSMENT:  CLINICAL IMPRESSION: Pt presents today with no new complaints. Skilled PT session focused on review and progression of HEP. Pt needs UE for optimal stability with dynamic balance activities and cues for increased step length. Pt will continue to benefit from skilled PT towards goals for improved functional mobility and decreased fall risk.   OBJECTIVE IMPAIRMENTS: Abnormal gait, decreased balance, decreased mobility, difficulty walking, decreased strength, dizziness, and postural dysfunction.   ACTIVITY LIMITATIONS: bending, standing, transfers, reach over head, and locomotion level  PARTICIPATION LIMITATIONS:  community activity, church, and choir  PERSONAL FACTORS: 3+ comorbidities: See above are also affecting patient's functional outcome.   REHAB POTENTIAL: Good  CLINICAL DECISION MAKING: Stable/uncomplicated  EVALUATION COMPLEXITY: Low   PLAN:  PT FREQUENCY: 2x/week  PT DURATION: 6 weeks plus eval  PLANNED INTERVENTIONS: 97750- Physical Performance Testing, 97110-Therapeutic exercises, 97530- Therapeutic activity, 97112- Neuromuscular re-education, 97535- Self Care, [id number]- Manual therapy, [phone]- Gait training, Patient/Family education, Balance training, and Vestibular training  PLAN FOR NEXT SESSION: review updates HEP; wt shifting and gait training to address shuffling/longer step length; work on Editor, commissioning, VOR exercises  Greig Anon, PT 07/20/23 9:32 AM Phone: [phone] Fax: [phone]  [facility] at [facility] [address] [place], [place] [address] Phone # [phone] Fax # [phone]

## 2023-07-24 ENCOUNTER — Ambulatory Visit
Admission: RE | Admit: 2023-07-24 | Discharge: 2023-07-24 | Disposition: A | Discharge status: Home / Self Care | Source: Ambulatory Visit | Attending: Gastroenterology | Admitting: Gastroenterology

## 2023-07-24 ENCOUNTER — Ambulatory Visit: Admitting: Physical Therapy

## 2023-07-24 DIAGNOSIS — R932 Abnormal findings on diagnostic imaging of liver and biliary tract: Secondary | ICD-10-CM

## 2023-07-24 DIAGNOSIS — R1310 Dysphagia, unspecified: Secondary | ICD-10-CM

## 2023-07-24 DIAGNOSIS — K224 Dyskinesia of esophagus: Secondary | ICD-10-CM | POA: Diagnosis not present

## 2023-07-24 DIAGNOSIS — K828 Other specified diseases of gallbladder: Secondary | ICD-10-CM | POA: Diagnosis not present

## 2023-07-24 DIAGNOSIS — K769 Liver disease, unspecified: Secondary | ICD-10-CM | POA: Diagnosis not present

## 2023-07-25 ENCOUNTER — Encounter: Payer: Self-pay | Admitting: Physical Therapy

## 2023-07-25 ENCOUNTER — Ambulatory Visit: Attending: Diagnostic Neuroimaging | Admitting: Physical Therapy

## 2023-07-25 DIAGNOSIS — R42 Dizziness and giddiness: Secondary | ICD-10-CM | POA: Insufficient documentation

## 2023-07-25 DIAGNOSIS — R2689 Other abnormalities of gait and mobility: Secondary | ICD-10-CM | POA: Diagnosis not present

## 2023-07-25 DIAGNOSIS — R2681 Unsteadiness on feet: Secondary | ICD-10-CM | POA: Diagnosis not present

## 2023-07-25 NOTE — Therapy (Signed)
 OUTPATIENT PHYSICAL THERAPY VESTIBULAR TREATMENT     Patient Name: Courtney Pearson MRN: [id number] DOB:1950/11/27, 73 y.o., female Today's Date: 07/25/2023  END OF SESSION:  PT End of Session - 07/25/23 1406     Visit Number 4    Number of Visits 13    Date for PT Re-Evaluation 08/24/23    Authorization Type Medicare/[employer]    Progress Note Due on Visit 10    PT Start Time 1405    PT Stop Time 1444    PT Time Calculation (min) 39 min    Equipment Utilized During Treatment Gait belt    Activity Tolerance Patient tolerated treatment well    Behavior During Therapy WFL for tasks assessed/performed             Past Medical History:  Diagnosis Date   Acute on chronic diastolic heart failure (HCC) 03/12/2020   Atypical atrial flutter (HCC) 03/12/2020   CAD in native artery 03/20/2019   Chronic diastolic heart failure (HCC) 03/12/2020   Diabetes mellitus type 1 (HCC) 1969   History of anemia due to chronic kidney disease 08/29/2022   HTN (hypertension)    Hyperlipidemia    Hypothyroidism    Mitral stenosis 11/16/2021   Osteoporosis 2024   Palpitation    Pancytopenia (HCC) 08/29/2022   Persistent atrial fibrillation (HCC) 04/03/2017   Renal insufficiency    Sick sinus syndrome (HCC) 07/06/2020   Weakness 10/06/2020   Past Surgical History:  Procedure Laterality Date   ATRIAL FIBRILLATION ABLATION N/A 01/01/2018   Procedure: ATRIAL FIBRILLATION ABLATION;  Surgeon: Kelsie Agent, MD;  Location: [facility];  Service: Cardiovascular;  Laterality: N/A;   ATRIAL FIBRILLATION ABLATION N/A 10/31/2018   Procedure: ATRIAL FIBRILLATION ABLATION;  Surgeon: Kelsie Agent, MD;  Location: [facility];  Service: Cardiovascular;  Laterality: N/A;   ATRIAL FIBRILLATION ABLATION N/A 01/06/2021   Procedure: ATRIAL FIBRILLATION ABLATION;  Surgeon: Kelsie Agent, MD;  Location: [facility];  Service: Cardiovascular;  Laterality: N/A;   BREAST BIOPSY     BREAST  EXCISIONAL BIOPSY Right 1993   CARDIOVERSION N/A 04/30/2017   Procedure: CARDIOVERSION;  Surgeon: Raford Riggs, MD;  Location: [facility] ENDOSCOPY;  Service: Cardiovascular;  Laterality: N/A;   CARDIOVERSION N/A 02/15/2018   Procedure: CARDIOVERSION;  Surgeon: Raford Riggs, MD;  Location: [facility] ENDOSCOPY;  Service: Cardiovascular;  Laterality: N/A;   CARDIOVERSION N/A 04/10/2018   Procedure: CARDIOVERSION;  Surgeon: Delford Maude BROCKS, MD;  Location: [facility] ENDOSCOPY;  Service: Cardiovascular;  Laterality: N/A;   CESAREAN SECTION     EYE SURGERY     Patient Active Problem List   Diagnosis Date Noted   History of anemia due to chronic kidney disease 08/29/2022   Pancytopenia (HCC) 08/29/2022   Hypothyroidism    Pain due to onychomycosis of toenails of both feet 03/24/2022   Mitral stenosis 11/16/2021   Weakness 10/06/2020   Sick sinus syndrome (HCC) 07/06/2020   Chronic diastolic heart failure (HCC) 03/12/2020   Atypical atrial flutter (HCC) 03/12/2020   CAD in native artery 03/20/2019   Persistent atrial fibrillation (HCC)    Dyspnea 10/30/2012   Palpitations 10/30/2012   Essential hypertension 10/30/2012   Hyperlipidemia 10/30/2012   Diabetes mellitus type 1 (HCC) 1969    PCP: Verena Mems, MD  REFERRING PROVIDER: Margaret Eduard SAUNDERS, MD   REFERRING DIAG:  R42 (ICD-10-CM) - Dizziness  R26.9 (ICD-10-CM) - Gait difficulty    THERAPY DIAG:  Dizziness and giddiness  Unsteadiness on feet  Other abnormalities of gait and mobility  ONSET DATE: 07/04/2023 (MD referral)-symptoms began Feb 2025  Rationale for Evaluation and Treatment: Rehabilitation  SUBJECTIVE:   SUBJECTIVE STATEMENT: Had some additional tests yesterday-esophagus, etc, due to sometimes feeling that food gets stuck more.  They said it all looked okay.   Pt accompanied by: self  PERTINENT HISTORY: DM, CAD, chronic diastolic heart failure, HTN, HLD, osteoporosis, a-fib, neuropathy (per Dr. Chancy  note-absent vibration sense at toes and ankles)  PAIN:  Are you having pain? No  PRECAUTIONS: Fall  RED FLAGS: None   WEIGHT BEARING RESTRICTIONS: No  FALLS: Has patient fallen in last 6 months? No  LIVING ENVIRONMENT: Lives with: lives with their spouse Lives in: House/apartment Stairs: several steps to enter; bedroom on floor Has following equipment at home: Single point cane  PLOF: Independent and Leisure: Enjoys participating in choir  PATIENT GOALS: To help improve things for dizziness, balance  OBJECTIVE:     Pt does not bring cane into therapy session today.  TODAY'S TREATMENT: 07/25/2023 Activity Comments     Romberg EC + head turns/nods 30 Intermittent UE support, mild sway  Partial tandem EO + head turns/nods 30 EC 30 sec Mod sway     Sidestep resistance at ankles x 1 min Monster walks forward x 1 min Tandem gait forward x 1 min (no resistance) Red band   Forward/back walking  at [employer] for heel strike, step length  Alt step taps to 4 step, 2 x 10 BUE support>1 UE support  Forward/back step and weightshift x 10 UE support and cues for increased step length in posterior direction  Gait with counting steps in 20 ft distance, 4 reps Able to go from 23 steps>19 steps with mild unsteadiness  180 degree turns in doorway, R and L Min guard, increased unsteadiness towards R side  Gait with figure-8 turns and wide U turns Pt tends to hit L foot on the hula hoops set down to follow for figure-8; unsteady with turns       HOME EXERCISE PROGRAM Access Code: [email or web address] URL: [email or web address] Date: 07/20/2023 Prepared by: [facility] - Outpatient  Rehab - [facility]  Program Notes perform exercises at counter top or in corner for safety  Exercises - Staggered Stance Forward Backward Weight Shift with Counter Support  - 1 x daily - 5 x weekly - 2 sets - 10 reps - Side to Side Weight Shift with Counter Support  - 1 x daily - 5 x  weekly - 2 sets - 10 reps - Romberg Stance with Head Nods  - 1 x daily - 5 x weekly - 2-3 sets - 30 sec hold - Romberg Stance with Head Rotation  - 1 x daily - 5 x weekly - 2-3 sets - 30 sec hold - Romberg Stance with Eyes Closed  - 1 x daily - 5 x weekly - 2-3 sets - 30 sec hold - Alternating Step Taps with Counter Support  - 1 x daily - 7 x weekly - 2 sets - 10 reps - Backward Walking with Counter Support  - 1 x daily - 7 x weekly - 1 sets - 3-5 reps        PATIENT EDUCATION: Education details: HEP review and continue current HEP; counting steps as a means to increase step length and foot clearance with typical paths at home Person educated: Patient Education method: Explanation, Demonstration, and Verbal cues Education comprehension: verbalized understanding and returned demonstration  Note: Objective measures were completed at Evaluation unless otherwise noted.  DIAGNOSTIC FINDINGS: MRI unremarkable  COGNITION: Overall cognitive status: Within functional limits for tasks assessed   SENSATION: See above-decreased sensation toes and ankles  POSTURE:  rounded shoulders, forward head, and posterior pelvic tilt  Cervical ROM:    Active A/PROM (deg) eval  Flexion 33  Extension 25  Right lateral flexion   Left lateral flexion   Right rotation 45  Left rotation 50  (Blank rows = not tested)   TRANSFERS: Assistive device utilized: BUEs from chair  Sit to stand: Modified independence Stand to sit: Modified independence   GAIT: Gait pattern: step through pattern, decreased step length- Right, decreased step length- Left, and wide BOS Distance walked: 50 ft Assistive device utilized: None Level of assistance: SBA Comments: Decreased push-off with gait   PATIENT SURVEYS:  DHI 28  VESTIBULAR ASSESSMENT:  GENERAL OBSERVATION: No acute distress   SYMPTOM BEHAVIOR:  Subjective history: Dizziness has been going on for several months, more of an off-balance  feeling.  Denies falls, hx of migraines. Does have hx of diabetic retinopathy, neuropathy.  Has had vertigo in the past and this is different.   Non-Vestibular symptoms: neck tightness  Type of dizziness: Imbalance (Disequilibrium), Unsteady with head/body turns, and Swimmyheaded  Frequency: daily  Duration: comes and goes  Aggravating factors: Induced by motion: looking up at the ceiling, bending down to the ground, turning body quickly, and turning head quickly  Relieving factors: slow movements and hold onto something  Progression of symptoms: better  OCULOMOTOR EXAM:  Wears progressive lenses  Ocular Alignment: normal  Ocular ROM: No Limitations  Spontaneous Nystagmus: absent  Gaze-Induced Nystagmus: slight extra L eye movements at end range   Smooth Pursuits: intact  Saccades: intact  Convergence/Divergence: unable; L eye abducts about 3-4 cm   VESTIBULAR - OCULAR REFLEX:   Slow VOR: Comment: mild symptoms of dizziness (4/10) and neck tightness; mild dizziness (4/10) and neck tightness  VOR Cancellation: Comment: no dizziness, just neck tightness  Head-Impulse Test: HIT Right: positive HIT Left: negative  Dynamic Visual Acuity: NT   POSITIONAL TESTING: Other: NT at eval due to pt not reporting any dizziness, spinning sensations  MOTION SENSITIVITY: NT at EVAL  Motion Sensitivity Quotient Intensity: 0 = none, 1 = Lightheaded, 2 = Mild, 3 = Moderate, 4 = Severe, 5 = Vomiting  Intensity  1. Sitting to supine   2. Supine to L side   3. Supine to R side   4. Supine to sitting   5. L Hallpike-Dix   6. Up from L    7. R Hallpike-Dix   8. Up from R    9. Sitting, head tipped to L knee   10. Head up from L knee   11. Sitting, head tipped to R knee   12. Head up from R knee   13. Sitting head turns x5   14.Sitting head nods x5   15. In stance, 180 turn to L    16. In stance, 180 turn to R     OTHOSTATICS: not done  FUNCTIONAL GAIT: 10 meter walk test: 18.78 sec = 1.75  ft/sec   M-CTSIB  Condition 1: Firm Surface, EO 30 Sec, Normal Sway  Condition 2: Firm Surface, EC 30 Sec, Mild Sway  Condition 3: Foam Surface, EO 30 Sec, Mild Sway  Condition 4: Foam Surface, EC 2.75 Sec, Severe Sway  TREATMENT DATE: 07/12/2023    PATIENT EDUCATION: Education details: Eval findings, POC Person educated: Patient Education method: Explanation Education comprehension: verbalized understanding  HOME EXERCISE PROGRAM: Not yet initiated  GOALS: Goals reviewed with patient? Yes  SHORT TERM GOALS: Target date: 08/10/2023  Pt will be independent with HEP for improved dizziness, balance. Baseline: Goal status: IN PROGRESS  2.  Pt will perform Condition 4 on MCTSIB for at least 15 sec, for improved balance. Baseline:  Goal status: IN PROGRESS  3.  Pt will improve cervical range of motion in all directions by 5 degrees with no c/o increased dizziness for improved functional mboility.  Baseline:  Goal status: IN PROGRESS  LONG TERM GOALS: Target date: 08/24/2023  Pt will be independent with HEP for improved dizziness and balance.. Baseline:  Goal status: IN PROGRESS  2.  DGI score to be assessed, to improve to at least 19/24 for decreased fall risk. Baseline: TBA Goal status: IN PROGRESS  3.  Pt will perform Condition 4 on MCTSIB to at least 30 sec mod sway or less, for improved balance. Baseline: 2.75 sec Goal status: IN PROGRESS  4.  Pt will improve DHI score to less than or equal to 10, to demo decreased dizziness affecting daily activities. Baseline: 28 Goal status: IN PROGRESS  5.  Pt will improve gait velocity to at least 2 ft/sec for improved gait efficiency and safety. Baseline: 1.75 ft/sec Goal status: IN PROGRESS   ASSESSMENT:  CLINICAL IMPRESSION: Pt presents today with no new complaints, reporting she didn't bring  cane in today as she felt steady due to getting a closer parking spot. . Skilled PT session focused on review of HEP updates, which pt does well.  Also progressed to gait activities and turns, without cane today.  Practiced counting steps as a method to try to take slightly larger steps in familiar paths at home, and pt did this with mild sway, but improves with repetition.  Pt has increased unsteadiness with 180 turns at doorway as well as walking turns, and will benefit from additional practice with turns.  Pt will continue to benefit from skilled PT towards goals for improved functional mobility and decreased fall risk.   OBJECTIVE IMPAIRMENTS: Abnormal gait, decreased balance, decreased mobility, difficulty walking, decreased strength, dizziness, and postural dysfunction.   ACTIVITY LIMITATIONS: bending, standing, transfers, reach over head, and locomotion level  PARTICIPATION LIMITATIONS: community activity, church, and choir  PERSONAL FACTORS: 3+ comorbidities: See above are also affecting patient's functional outcome.   REHAB POTENTIAL: Good  CLINICAL DECISION MAKING: Stable/uncomplicated  EVALUATION COMPLEXITY: Low   PLAN:  PT FREQUENCY: 2x/week  PT DURATION: 6 weeks plus eval  PLANNED INTERVENTIONS: 97750- Physical Performance Testing, 97110-Therapeutic exercises, 97530- Therapeutic activity, [id number]- Neuromuscular re-education, 97535- Self Care, [id number]- Manual therapy, [phone]- Gait training, Patient/Family education, Balance training, and Vestibular training  PLAN FOR NEXT SESSION: Continue gait with turns (may need to do this with cane for optimal safety). Weight shifting and gait training to address shuffling/longer step length; work on Editor, commissioning, VOR exercises  Greig Anon, PT 07/25/23 2:51 PM Phone: [phone] Fax: [phone]  [facility] at [facility] [address], [address] [place] [address], [place] [address] Phone # [phone] Fax # [phone]

## 2023-07-26 DIAGNOSIS — Z0000 Encounter for general adult medical examination without abnormal findings: Secondary | ICD-10-CM | POA: Diagnosis not present

## 2023-07-26 DIAGNOSIS — I5042 Chronic combined systolic (congestive) and diastolic (congestive) heart failure: Secondary | ICD-10-CM | POA: Diagnosis not present

## 2023-07-26 DIAGNOSIS — D6869 Other thrombophilia: Secondary | ICD-10-CM | POA: Diagnosis not present

## 2023-07-26 DIAGNOSIS — I480 Paroxysmal atrial fibrillation: Secondary | ICD-10-CM | POA: Diagnosis not present

## 2023-07-26 DIAGNOSIS — I700 Atherosclerosis of aorta: Secondary | ICD-10-CM | POA: Diagnosis not present

## 2023-07-31 ENCOUNTER — Ambulatory Visit: Admitting: Rehabilitative and Restorative Service Providers"

## 2023-07-31 ENCOUNTER — Encounter: Payer: Self-pay | Admitting: Rehabilitative and Restorative Service Providers"

## 2023-07-31 DIAGNOSIS — R2689 Other abnormalities of gait and mobility: Secondary | ICD-10-CM

## 2023-07-31 DIAGNOSIS — R42 Dizziness and giddiness: Secondary | ICD-10-CM | POA: Diagnosis not present

## 2023-07-31 DIAGNOSIS — R2681 Unsteadiness on feet: Secondary | ICD-10-CM

## 2023-07-31 NOTE — Therapy (Signed)
 OUTPATIENT PHYSICAL THERAPY VESTIBULAR TREATMENT   Patient Name: Courtney Pearson MRN: [id number] DOB:1950/07/28, 73 y.o., female Today's Date: 07/31/2023  END OF SESSION:  PT End of Session - 07/31/23 1016     Visit Number 5    Number of Visits 13    Date for PT Re-Evaluation 08/24/23    Authorization Type Medicare/[employer]    Progress Note Due on Visit 10    PT Start Time 1018    PT Stop Time 1100    PT Time Calculation (min) 42 min    Equipment Utilized During Treatment Gait belt    Activity Tolerance Patient tolerated treatment well    Behavior During Therapy WFL for tasks assessed/performed          Past Medical History:  Diagnosis Date   Acute on chronic diastolic heart failure (HCC) 03/12/2020   Atypical atrial flutter (HCC) 03/12/2020   CAD in native artery 03/20/2019   Chronic diastolic heart failure (HCC) 03/12/2020   Diabetes mellitus type 1 (HCC) 1969   History of anemia due to chronic kidney disease 08/29/2022   HTN (hypertension)    Hyperlipidemia    Hypothyroidism    Mitral stenosis 11/16/2021   Osteoporosis 2024   Palpitation    Pancytopenia (HCC) 08/29/2022   Persistent atrial fibrillation (HCC) 04/03/2017   Renal insufficiency    Sick sinus syndrome (HCC) 07/06/2020   Weakness 10/06/2020   Past Surgical History:  Procedure Laterality Date   ATRIAL FIBRILLATION ABLATION N/A 01/01/2018   Procedure: ATRIAL FIBRILLATION ABLATION;  Surgeon: Kelsie Agent, MD;  Location: [facility];  Service: Cardiovascular;  Laterality: N/A;   ATRIAL FIBRILLATION ABLATION N/A 10/31/2018   Procedure: ATRIAL FIBRILLATION ABLATION;  Surgeon: Kelsie Agent, MD;  Location: [facility];  Service: Cardiovascular;  Laterality: N/A;   ATRIAL FIBRILLATION ABLATION N/A 01/06/2021   Procedure: ATRIAL FIBRILLATION ABLATION;  Surgeon: Kelsie Agent, MD;  Location: [facility];  Service: Cardiovascular;  Laterality: N/A;   BREAST BIOPSY     BREAST EXCISIONAL  BIOPSY Right 1993   CARDIOVERSION N/A 04/30/2017   Procedure: CARDIOVERSION;  Surgeon: Raford Riggs, MD;  Location: [facility] ENDOSCOPY;  Service: Cardiovascular;  Laterality: N/A;   CARDIOVERSION N/A 02/15/2018   Procedure: CARDIOVERSION;  Surgeon: Raford Riggs, MD;  Location: [facility] ENDOSCOPY;  Service: Cardiovascular;  Laterality: N/A;   CARDIOVERSION N/A 04/10/2018   Procedure: CARDIOVERSION;  Surgeon: Delford Maude BROCKS, MD;  Location: [facility] ENDOSCOPY;  Service: Cardiovascular;  Laterality: N/A;   CESAREAN SECTION     EYE SURGERY     Patient Active Problem List   Diagnosis Date Noted   History of anemia due to chronic kidney disease 08/29/2022   Pancytopenia (HCC) 08/29/2022   Hypothyroidism    Pain due to onychomycosis of toenails of both feet 03/24/2022   Mitral stenosis 11/16/2021   Weakness 10/06/2020   Sick sinus syndrome (HCC) 07/06/2020   Chronic diastolic heart failure (HCC) 03/12/2020   Atypical atrial flutter (HCC) 03/12/2020   CAD in native artery 03/20/2019   Persistent atrial fibrillation (HCC)    Dyspnea 10/30/2012   Palpitations 10/30/2012   Essential hypertension 10/30/2012   Hyperlipidemia 10/30/2012   Diabetes mellitus type 1 (HCC) 1969    PCP: Verena Mems, MD REFERRING PROVIDER: Margaret Eduard SAUNDERS, MD  REFERRING DIAG:  R42 (ICD-10-CM) - Dizziness  R26.9 (ICD-10-CM) - Gait difficulty   THERAPY DIAG:  Dizziness and giddiness  Unsteadiness on feet  Other abnormalities of gait and mobility  ONSET  DATE: 07/04/2023 (MD referral)-symptoms began Feb 2025  Rationale for Evaluation and Treatment: Rehabilitation  SUBJECTIVE:   SUBJECTIVE STATEMENT: The patient is doing HEP and it is going well. She did not bring her cane today. She has some difficulty doing alternating taps at home to balance on one leg. She is not as dizzy, but further describes head movement makes me feel off balance in my head.  Pt accompanied by: self  PERTINENT HISTORY: DM, CAD,  chronic diastolic heart failure, HTN, HLD, osteoporosis, a-fib, neuropathy (per Dr. 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 No  PRECAUTIONS: Fall  WEIGHT BEARING RESTRICTIONS: No  FALLS: Has patient fallen in last 6 months? No  LIVING ENVIRONMENT: Lives with: lives with their spouse Lives in: House/apartment Stairs: several steps to enter; bedroom on floor Has following equipment at home: Single point cane  PLOF: Independent and Leisure: Enjoys participating in choir  PATIENT GOALS: To help improve things for dizziness, balance  OBJECTIVE:   [facility] Adult PT Treatment:                                                DATE: 07/31/23 Neuromuscular re-ed: Gaze adaptation Seated x 20 seconds Standing x 20 seconds horizontal plane and then vertical plane with wide base of support Balance Compliant foam pad with head turns horiz and vertical + ball toss with CGA Rocker board anterior/posterior rocking and then maintaining middle with CGA Reactive balance with external, unpredictable perturbations on rocker board with CGA Limit of stability training with wall lean to anterior weight shifting through forefoot and back to wall Habituation Head turns in standing reaching a ball R and L sides to sidestep (hit knee) and then bring ball overhead while following with her head Therapeutic Activity: Foot position in shoes (shoes look like they roll out, but foot posture is into pronation--- lateral heel must be causing wear down of shoes) Arch risers in standing x 10 reps Toe lifts x 10 reps in standing  Recommended she bring her shoes into therapy in order to trial multiple pairs Gait: Dynamic gait activities with ball ross Head turns horiz and vertical with CGA x 50 ft x 4 reps Turning 180 degrees with CGA due to loss of balance with turns  HOME EXERCISE PROGRAM: Access Code: [email or web address] URL: [email or web address] Date:  07/20/2023 Prepared by: [facility] - Outpatient  Rehab - [facility]  Program Notes perform exercises at counter top or in corner for safety  Exercises - Staggered Stance Forward Backward Weight Shift with Counter Support  - 1 x daily - 5 x weekly - 2 sets - 10 reps - Side to Side Weight Shift with Counter Support  - 1 x daily - 5 x weekly - 2 sets - 10 reps - Romberg Stance with Head Nods  - 1 x daily - 5 x weekly - 2-3 sets - 30 sec hold - Romberg Stance with Head Rotation  - 1 x daily - 5 x weekly - 2-3 sets - 30 sec hold - Romberg Stance with Eyes Closed  - 1 x daily - 5 x weekly - 2-3 sets - 30 sec hold - Alternating Step Taps with Counter Support  - 1 x daily - 7 x weekly - 2 sets - 10 reps - Backward Walking with [employer] Support  -  1 x daily - 7 x weekly - 1 sets - 3-5 reps   PATIENT EDUCATION: Education details: HEP review and continue current HEP; counting steps as a means to increase step length and foot clearance with typical paths at home Person educated: Patient Education method: Explanation, Demonstration, and Verbal cues Education comprehension: verbalized understanding and returned demonstration  ___________________________________________________________________________________ Note: Objective measures were completed at Evaluation unless otherwise noted.  DIAGNOSTIC FINDINGS: MRI unremarkable  COGNITION: Overall cognitive status: Within functional limits for tasks assessed   SENSATION: See above-decreased sensation toes and ankles  POSTURE:  rounded shoulders, forward head, and posterior pelvic tilt  Cervical ROM:    Active A/PROM (deg) eval  Flexion 33  Extension 25  Right lateral flexion   Left lateral flexion   Right rotation 45  Left rotation 50  (Blank rows = not tested)   TRANSFERS: Assistive device utilized: BUEs from chair  Sit to stand: Modified independence Stand to sit: Modified independence   GAIT: Gait pattern: step through  pattern, decreased step length- Right, decreased step length- Left, and wide BOS Distance walked: 50 ft Assistive device utilized: None Level of assistance: SBA Comments: Decreased push-off with gait   PATIENT SURVEYS:  DHI 28  VESTIBULAR ASSESSMENT:  GENERAL OBSERVATION: No acute distress   SYMPTOM BEHAVIOR:  Subjective history: Dizziness has been going on for several months, more of an off-balance feeling.  Denies falls, hx of migraines. Does have hx of diabetic retinopathy, neuropathy.  Has had vertigo in the past and this is different.   Non-Vestibular symptoms: neck tightness  Type of dizziness: Imbalance (Disequilibrium), Unsteady with head/body turns, and Swimmyheaded  Frequency: daily  Duration: comes and goes  Aggravating factors: Induced by motion: looking up at the ceiling, bending down to the ground, turning body quickly, and turning head quickly  Relieving factors: slow movements and hold onto something  Progression of symptoms: better  OCULOMOTOR EXAM:  Wears progressive lenses  Ocular Alignment: normal  Ocular ROM: No Limitations  Spontaneous Nystagmus: absent  Gaze-Induced Nystagmus: slight extra L eye movements at end range   Smooth Pursuits: intact  Saccades: intact  Convergence/Divergence: unable; L eye abducts about 3-4 cm   VESTIBULAR - OCULAR REFLEX:   Slow VOR: Comment: mild symptoms of dizziness (4/10) and neck tightness; mild dizziness (4/10) and neck tightness  VOR Cancellation: Comment: no dizziness, just neck tightness  Head-Impulse Test: HIT Right: positive HIT Left: negative  Dynamic Visual Acuity: NT   POSITIONAL TESTING: Other: NT at eval due to pt not reporting any dizziness, spinning sensations  MOTION SENSITIVITY: NT at EVAL  Motion Sensitivity Quotient Intensity: 0 = none, 1 = Lightheaded, 2 = Mild, 3 = Moderate, 4 = Severe, 5 = Vomiting  Intensity  1. Sitting to supine   2. Supine to L side   3. Supine to R side   4. Supine to  sitting   5. L Hallpike-Dix   6. Up from L    7. R Hallpike-Dix   8. Up from R    9. Sitting, head tipped to L knee   10. Head up from L knee   11. Sitting, head tipped to R knee   12. Head up from R knee   13. Sitting head turns x5   14.Sitting head nods x5   15. In stance, 180 turn to L    16. In stance, 180 turn to R     OTHOSTATICS: not done  FUNCTIONAL GAIT: 10 meter walk  test: 18.78 sec = 1.75 ft/sec   M-CTSIB  Condition 1: Firm Surface, EO 30 Sec, Normal Sway  Condition 2: Firm Surface, EC 30 Sec, Mild Sway  Condition 3: Foam Surface, EO 30 Sec, Mild Sway  Condition 4: Foam Surface, EC 2.75 Sec, Severe Sway     _________________________________________________________________________________________                                                                                                                          GOALS: Goals reviewed with patient? Yes  SHORT TERM GOALS: Target date: 08/10/2023  Pt will be independent with HEP for improved dizziness, balance. Baseline: Goal status: IN PROGRESS  2.  Pt will perform Condition 4 on MCTSIB for at least 15 sec, for improved balance. Baseline:  Goal status: IN PROGRESS  3.  Pt will improve cervical range of motion in all directions by 5 degrees with no c/o increased dizziness for improved functional mboility.  Baseline:  Goal status: IN PROGRESS  LONG TERM GOALS: Target date: 08/24/2023  Pt will be independent with HEP for improved dizziness and balance.. Baseline:  Goal status: IN PROGRESS  2.  DGI score to be assessed, to improve to at least 19/24 for decreased fall risk. Baseline: TBA Goal status: IN PROGRESS  3.  Pt will perform Condition 4 on MCTSIB to at least 30 sec mod sway or less, for improved balance. Baseline: 2.75 sec Goal status: IN PROGRESS  4.  Pt will improve DHI score to less than or equal to 10, to demo decreased dizziness affecting daily activities. Baseline: 28 Goal status: IN  PROGRESS  5.  Pt will improve gait velocity to at least 2 ft/sec for improved gait efficiency and safety. Baseline: 1.75 ft/sec Goal status: IN PROGRESS   ASSESSMENT:  CLINICAL IMPRESSION: The patient is progressing in therapy with tolerance to dynamic gait activities, gaze activities, and balance training. She is arriving without device to therapy. She continues to report a sensation of imbalance in her head with head motion, therefore, PT continuing with habituation and functional scanning tasks in therapy. PT to continue to focus on progression of functional mobility tasks to improve balance and reduce risk for falls.   OBJECTIVE IMPAIRMENTS: Abnormal gait, decreased balance, decreased mobility, difficulty walking, decreased strength, dizziness, and postural dysfunction.   PLAN:  PT FREQUENCY: 2x/week  PT DURATION: 6 weeks plus eval  PLANNED INTERVENTIONS: 97750- Physical Performance Testing, 97110-Therapeutic exercises, 97530- Therapeutic activity, [id number]- Neuromuscular re-education, 97535- Self Care, [id number]- Manual therapy, [phone]- Gait training, Patient/Family education, Balance training, and Vestibular training  PLAN FOR NEXT SESSION: Continue gait with turns (may need to do this with cane for optimal safety). Weight shifting and gait training to address shuffling/longer step length; work on Editor, commissioning, VOR exercises.    Adren Dollins, PT 07/31/23 11:03 AM [facility] [address] [place], [address] [place], [place] [address] Phone # [phone] Fax # [phone]

## 2023-08-03 ENCOUNTER — Ambulatory Visit

## 2023-08-03 DIAGNOSIS — R42 Dizziness and giddiness: Secondary | ICD-10-CM | POA: Diagnosis not present

## 2023-08-03 DIAGNOSIS — R2689 Other abnormalities of gait and mobility: Secondary | ICD-10-CM | POA: Diagnosis not present

## 2023-08-03 DIAGNOSIS — R2681 Unsteadiness on feet: Secondary | ICD-10-CM

## 2023-08-03 NOTE — Therapy (Signed)
 OUTPATIENT PHYSICAL THERAPY VESTIBULAR TREATMENT   Patient Name: Courtney Pearson MRN: [id number] DOB:November 11, 1950, 73 y.o., female Today's Date: 08/03/2023  END OF SESSION:  PT End of Session - 08/03/23 1013     Visit Number 6    Number of Visits 13    Date for PT Re-Evaluation 08/24/23    Authorization Type Medicare/[employer]    Progress Note Due on Visit 10    PT Start Time 1015    PT Stop Time 1100    PT Time Calculation (min) 45 min    Equipment Utilized During Treatment Gait belt    Activity Tolerance Patient tolerated treatment well    Behavior During Therapy WFL for tasks assessed/performed          Past Medical History:  Diagnosis Date   Acute on chronic diastolic heart failure (HCC) 03/12/2020   Atypical atrial flutter (HCC) 03/12/2020   CAD in native artery 03/20/2019   Chronic diastolic heart failure (HCC) 03/12/2020   Diabetes mellitus type 1 (HCC) 1969   History of anemia due to chronic kidney disease 08/29/2022   HTN (hypertension)    Hyperlipidemia    Hypothyroidism    Mitral stenosis 11/16/2021   Osteoporosis 2024   Palpitation    Pancytopenia (HCC) 08/29/2022   Persistent atrial fibrillation (HCC) 04/03/2017   Renal insufficiency    Sick sinus syndrome (HCC) 07/06/2020   Weakness 10/06/2020   Past Surgical History:  Procedure Laterality Date   ATRIAL FIBRILLATION ABLATION N/A 01/01/2018   Procedure: ATRIAL FIBRILLATION ABLATION;  Surgeon: Kelsie Agent, MD;  Location: [facility];  Service: Cardiovascular;  Laterality: N/A;   ATRIAL FIBRILLATION ABLATION N/A 10/31/2018   Procedure: ATRIAL FIBRILLATION ABLATION;  Surgeon: Kelsie Agent, MD;  Location: [facility];  Service: Cardiovascular;  Laterality: N/A;   ATRIAL FIBRILLATION ABLATION N/A 01/06/2021   Procedure: ATRIAL FIBRILLATION ABLATION;  Surgeon: Kelsie Agent, MD;  Location: [facility];  Service: Cardiovascular;  Laterality: N/A;   BREAST BIOPSY     BREAST EXCISIONAL  BIOPSY Right 1993   CARDIOVERSION N/A 04/30/2017   Procedure: CARDIOVERSION;  Surgeon: Raford Riggs, MD;  Location: [facility] ENDOSCOPY;  Service: Cardiovascular;  Laterality: N/A;   CARDIOVERSION N/A 02/15/2018   Procedure: CARDIOVERSION;  Surgeon: Raford Riggs, MD;  Location: [facility] ENDOSCOPY;  Service: Cardiovascular;  Laterality: N/A;   CARDIOVERSION N/A 04/10/2018   Procedure: CARDIOVERSION;  Surgeon: Delford Maude BROCKS, MD;  Location: [facility] ENDOSCOPY;  Service: Cardiovascular;  Laterality: N/A;   CESAREAN SECTION     EYE SURGERY     Patient Active Problem List   Diagnosis Date Noted   History of anemia due to chronic kidney disease 08/29/2022   Pancytopenia (HCC) 08/29/2022   Hypothyroidism    Pain due to onychomycosis of toenails of both feet 03/24/2022   Mitral stenosis 11/16/2021   Weakness 10/06/2020   Sick sinus syndrome (HCC) 07/06/2020   Chronic diastolic heart failure (HCC) 03/12/2020   Atypical atrial flutter (HCC) 03/12/2020   CAD in native artery 03/20/2019   Persistent atrial fibrillation (HCC)    Dyspnea 10/30/2012   Palpitations 10/30/2012   Essential hypertension 10/30/2012   Hyperlipidemia 10/30/2012   Diabetes mellitus type 1 (HCC) 1969    PCP: Verena Mems, MD REFERRING PROVIDER: Margaret Eduard SAUNDERS, MD  REFERRING DIAG:  R42 (ICD-10-CM) - Dizziness  R26.9 (ICD-10-CM) - Gait difficulty   THERAPY DIAG:  Dizziness and giddiness  Unsteadiness on feet  Other abnormalities of gait and mobility  ONSET  DATE: 07/04/2023 (MD referral)-symptoms began Feb 2025  Rationale for Evaluation and Treatment: Rehabilitation  SUBJECTIVE:   SUBJECTIVE STATEMENT: Feeling about the same.  Pt accompanied by: self  PERTINENT HISTORY: DM, CAD, chronic diastolic heart failure, HTN, HLD, osteoporosis, a-fib, neuropathy (per Dr. 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 No  PRECAUTIONS: Fall  WEIGHT BEARING RESTRICTIONS:  No  FALLS: Has patient fallen in last 6 months? No  LIVING ENVIRONMENT: Lives with: lives with their spouse Lives in: House/apartment Stairs: several steps to enter; bedroom on floor Has following equipment at home: Single point cane  PLOF: Independent and Leisure: Enjoys participating in choir  PATIENT GOALS: To help improve things for dizziness, balance  OBJECTIVE:   TODAY'S TREATMENT: 08/03/23 Activity Comments  Seated cervical SNAG For extension and rotation  Chin retraction 2x10 1x10 w/ fingers on chin 1x10 without  Seated arch lifts 1x10 Cues for performance for subtalar neutral with improved performance  Toe splaying   Techniques for foot/toe desensitization E.g. contrast bath, foot spa w/ vibration, rice bucket  Romberg EO/EC. Romberg EO/EC w/ head movements X 30 sec on firm/foam. Finger tip support needed for eyes closed on foam conditions  Retrowalk Tandem walk X 2 min along counter     [facility] Adult PT Treatment:                                                DATE: 07/31/23 Neuromuscular re-ed: Gaze adaptation Seated x 20 seconds Standing x 20 seconds horizontal plane and then vertical plane with wide base of support Balance Compliant foam pad with head turns horiz and vertical + ball toss with CGA Rocker board anterior/posterior rocking and then maintaining middle with CGA Reactive balance with external, unpredictable perturbations on rocker board with CGA Limit of stability training with wall lean to anterior weight shifting through forefoot and back to wall Habituation Head turns in standing reaching a ball R and L sides to sidestep (hit knee) and then bring ball overhead while following with her head Therapeutic Activity: Foot position in shoes (shoes look like they roll out, but foot posture is into pronation--- lateral heel must be causing wear down of shoes) Arch risers in standing x 10 reps Toe lifts x 10 reps in standing  Recommended she bring her shoes into  therapy in order to trial multiple pairs Gait: Dynamic gait activities with ball ross Head turns horiz and vertical with CGA x 50 ft x 4 reps Turning 180 degrees with CGA due to loss of balance with turns  HOME EXERCISE PROGRAM: Access Code: [email or web address] URL: [email or web address] Date: 07/20/2023 Prepared by: [facility] - Outpatient  Rehab - [facility]  Program Notes perform exercises at counter top or in corner for safety  Exercises - Staggered Stance Forward Backward Weight Shift with Counter Support  - 1 x daily - 5 x weekly - 2 sets - 10 reps - Side to Side Weight Shift with Counter Support  - 1 x daily - 5 x weekly - 2 sets - 10 reps - Romberg Stance with Head Nods  - 1 x daily - 5 x weekly - 2-3 sets - 30 sec hold - Romberg Stance with Head Rotation  - 1 x daily - 5 x weekly - 2-3 sets - 30 sec hold - Romberg Stance with  Eyes Closed  - 1 x daily - 5 x weekly - 2-3 sets - 30 sec hold - Alternating Step Taps with Counter Support  - 1 x daily - 7 x weekly - 2 sets - 10 reps - Backward Walking with Counter Support  - 1 x daily - 7 x weekly - 1 sets - 3-5 reps - Standing Cervical Retraction  - 1 x daily - 7 x weekly - 5-10 reps - 3-5 sec hold - Cervical Extension AROM with Strap  - 1 x daily - 7 x weekly - 1-2 sets - 10 reps - Seated Assisted Cervical Rotation with Towel  - 1 x daily - 7 x weekly - 1-2 sets - 10 reps   PATIENT EDUCATION: Education details: HEP review and continue current HEP; counting steps as a means to increase step length and foot clearance with typical paths at home Person educated: Patient Education method: Explanation, Demonstration, and Verbal cues Education comprehension: verbalized understanding and returned demonstration  ___________________________________________________________________________________ Note: Objective measures were completed at Evaluation unless otherwise noted.  DIAGNOSTIC FINDINGS: MRI  unremarkable  COGNITION: Overall cognitive status: Within functional limits for tasks assessed   SENSATION: See above-decreased sensation toes and ankles  POSTURE:  rounded shoulders, forward head, and posterior pelvic tilt  Cervical ROM:    Active A/PROM (deg) eval  Flexion 33  Extension 25  Right lateral flexion   Left lateral flexion   Right rotation 45  Left rotation 50  (Blank rows = not tested)   TRANSFERS: Assistive device utilized: BUEs from chair  Sit to stand: Modified independence Stand to sit: Modified independence   GAIT: Gait pattern: step through pattern, decreased step length- Right, decreased step length- Left, and wide BOS Distance walked: 50 ft Assistive device utilized: None Level of assistance: SBA Comments: Decreased push-off with gait   PATIENT SURVEYS:  DHI 28  VESTIBULAR ASSESSMENT:  GENERAL OBSERVATION: No acute distress   SYMPTOM BEHAVIOR:  Subjective history: Dizziness has been going on for several months, more of an off-balance feeling.  Denies falls, hx of migraines. Does have hx of diabetic retinopathy, neuropathy.  Has had vertigo in the past and this is different.   Non-Vestibular symptoms: neck tightness  Type of dizziness: Imbalance (Disequilibrium), Unsteady with head/body turns, and Swimmyheaded  Frequency: daily  Duration: comes and goes  Aggravating factors: Induced by motion: looking up at the ceiling, bending down to the ground, turning body quickly, and turning head quickly  Relieving factors: slow movements and hold onto something  Progression of symptoms: better  OCULOMOTOR EXAM:  Wears progressive lenses  Ocular Alignment: normal  Ocular ROM: No Limitations  Spontaneous Nystagmus: absent  Gaze-Induced Nystagmus: slight extra L eye movements at end range   Smooth Pursuits: intact  Saccades: intact  Convergence/Divergence: unable; L eye abducts about 3-4 cm   VESTIBULAR - OCULAR REFLEX:   Slow VOR: Comment:  mild symptoms of dizziness (4/10) and neck tightness; mild dizziness (4/10) and neck tightness  VOR Cancellation: Comment: no dizziness, just neck tightness  Head-Impulse Test: HIT Right: positive HIT Left: negative  Dynamic Visual Acuity: NT   POSITIONAL TESTING: Other: NT at eval due to pt not reporting any dizziness, spinning sensations  MOTION SENSITIVITY: NT at EVAL  Motion Sensitivity Quotient Intensity: 0 = none, 1 = Lightheaded, 2 = Mild, 3 = Moderate, 4 = Severe, 5 = Vomiting  Intensity  1. Sitting to supine   2. Supine to L side   3. Supine to  R side   4. Supine to sitting   5. L Hallpike-Dix   6. Up from L    7. R Hallpike-Dix   8. Up from R    9. Sitting, head tipped to L knee   10. Head up from L knee   11. Sitting, head tipped to R knee   12. Head up from R knee   13. Sitting head turns x5   14.Sitting head nods x5   15. In stance, 180 turn to L    16. In stance, 180 turn to R     OTHOSTATICS: not done  FUNCTIONAL GAIT: 10 meter walk test: 18.78 sec = 1.75 ft/sec   M-CTSIB  Condition 1: Firm Surface, EO 30 Sec, Normal Sway  Condition 2: Firm Surface, EC 30 Sec, Mild Sway  Condition 3: Foam Surface, EO 30 Sec, Mild Sway  Condition 4: Foam Surface, EC 2.75 Sec, Severe Sway     _________________________________________________________________________________________                                                                                                                          GOALS: Goals reviewed with patient? Yes  SHORT TERM GOALS: Target date: 08/10/2023  Pt will be independent with HEP for improved dizziness, balance. Baseline: Goal status: IN PROGRESS  2.  Pt will perform Condition 4 on MCTSIB for at least 15 sec, for improved balance. Baseline:  Goal status: IN PROGRESS  3.  Pt will improve cervical range of motion in all directions by 5 degrees with no c/o increased dizziness for improved functional mboility.  Baseline:  Goal  status: IN PROGRESS  LONG TERM GOALS: Target date: 08/24/2023  Pt will be independent with HEP for improved dizziness and balance.. Baseline:  Goal status: IN PROGRESS  2.  DGI score to be assessed, to improve to at least 19/24 for decreased fall risk. Baseline: TBA Goal status: IN PROGRESS  3.  Pt will perform Condition 4 on MCTSIB to at least 30 sec mod sway or less, for improved balance. Baseline: 2.75 sec Goal status: IN PROGRESS  4.  Pt will improve DHI score to less than or equal to 10, to demo decreased dizziness affecting daily activities. Baseline: 28 Goal status: IN PROGRESS  5.  Pt will improve gait velocity to at least 2 ft/sec for improved gait efficiency and safety. Baseline: 1.75 ft/sec Goal status: IN PROGRESS   ASSESSMENT:  CLINICAL IMPRESSION: Instructed in cervical spine mobilization/ROM techniques for postural re-education for chin retractions to address forward head to improve upright posture.  Pt reports ongoing foot pain/sensitivity and demo pes planus posture R>L and instructed in seated arch lifts for dynamic stabilization into subtalar neutral with improved performance following cues/instruction/guidance. Discussed various methods for improving foot intrinisic control and pain that she experiences. Unable to actively abduct 1st toe to midline (has slight hallux valgus). Static multisensory balance to improve postural stability/proprioception requiring UE support for eyes closed on compliant surface  conditions. Dynamic balance to improve single limb support and change of direction. Continued sessions to progress POC details to improve mobility and reduce fall risk  OBJECTIVE IMPAIRMENTS: Abnormal gait, decreased balance, decreased mobility, difficulty walking, decreased strength, dizziness, and postural dysfunction.   PLAN:  PT FREQUENCY: 2x/week  PT DURATION: 6 weeks plus eval  PLANNED INTERVENTIONS: 97750- Physical Performance Testing, 97110-Therapeutic  exercises, 97530- Therapeutic activity, [id number]- Neuromuscular re-education, 97535- Self Care, [id number]- Manual therapy, [phone]- Gait training, Patient/Family education, Balance training, and Vestibular training  PLAN FOR NEXT SESSION: Continue gait with turns (may need to do this with cane for optimal safety). Weight shifting and gait training to address shuffling/longer step length; work on Editor, commissioning, VOR exercises.    Jonette MARLA Sandifer, PT 08/03/23 10:13 AM [facility] [address] [place], [address] [place], [place] [address] Phone # [phone] Fax # [phone]

## 2023-08-07 ENCOUNTER — Encounter: Payer: Self-pay | Admitting: Rehabilitative and Restorative Service Providers"

## 2023-08-07 ENCOUNTER — Ambulatory Visit: Admitting: Rehabilitative and Restorative Service Providers"

## 2023-08-07 DIAGNOSIS — R2681 Unsteadiness on feet: Secondary | ICD-10-CM | POA: Diagnosis not present

## 2023-08-07 DIAGNOSIS — R2689 Other abnormalities of gait and mobility: Secondary | ICD-10-CM | POA: Diagnosis not present

## 2023-08-07 DIAGNOSIS — R42 Dizziness and giddiness: Secondary | ICD-10-CM | POA: Diagnosis not present

## 2023-08-07 NOTE — Therapy (Signed)
 OUTPATIENT PHYSICAL THERAPY VESTIBULAR TREATMENT   Patient Name: QUENESHA DOUGLASS MRN: [id number] DOB:1951/01/22, 73 y.o., female Today's Date: 08/07/2023  END OF SESSION:  PT End of Session - 08/07/23 1020     Visit Number 7    Number of Visits 13    Date for PT Re-Evaluation 08/24/23    Authorization Type Medicare/[employer]    Progress Note Due on Visit 10    PT Start Time 1018    PT Stop Time 1100    PT Time Calculation (min) 42 min    Equipment Utilized During Treatment Gait belt    Activity Tolerance Patient tolerated treatment well    Behavior During Therapy WFL for tasks assessed/performed         Past Medical History:  Diagnosis Date   Acute on chronic diastolic heart failure (HCC) 03/12/2020   Atypical atrial flutter (HCC) 03/12/2020   CAD in native artery 03/20/2019   Chronic diastolic heart failure (HCC) 03/12/2020   Diabetes mellitus type 1 (HCC) 1969   History of anemia due to chronic kidney disease 08/29/2022   HTN (hypertension)    Hyperlipidemia    Hypothyroidism    Mitral stenosis 11/16/2021   Osteoporosis 2024   Palpitation    Pancytopenia (HCC) 08/29/2022   Persistent atrial fibrillation (HCC) 04/03/2017   Renal insufficiency    Sick sinus syndrome (HCC) 07/06/2020   Weakness 10/06/2020   Past Surgical History:  Procedure Laterality Date   ATRIAL FIBRILLATION ABLATION N/A 01/01/2018   Procedure: ATRIAL FIBRILLATION ABLATION;  Surgeon: Kelsie Agent, MD;  Location: [facility];  Service: Cardiovascular;  Laterality: N/A;   ATRIAL FIBRILLATION ABLATION N/A 10/31/2018   Procedure: ATRIAL FIBRILLATION ABLATION;  Surgeon: Kelsie Agent, MD;  Location: [facility];  Service: Cardiovascular;  Laterality: N/A;   ATRIAL FIBRILLATION ABLATION N/A 01/06/2021   Procedure: ATRIAL FIBRILLATION ABLATION;  Surgeon: Kelsie Agent, MD;  Location: [facility];  Service: Cardiovascular;  Laterality: N/A;   BREAST BIOPSY     BREAST EXCISIONAL BIOPSY  Right 1993   CARDIOVERSION N/A 04/30/2017   Procedure: CARDIOVERSION;  Surgeon: Raford Riggs, MD;  Location: [facility] ENDOSCOPY;  Service: Cardiovascular;  Laterality: N/A;   CARDIOVERSION N/A 02/15/2018   Procedure: CARDIOVERSION;  Surgeon: Raford Riggs, MD;  Location: [facility] ENDOSCOPY;  Service: Cardiovascular;  Laterality: N/A;   CARDIOVERSION N/A 04/10/2018   Procedure: CARDIOVERSION;  Surgeon: Delford Maude BROCKS, MD;  Location: [facility] ENDOSCOPY;  Service: Cardiovascular;  Laterality: N/A;   CESAREAN SECTION     EYE SURGERY     Patient Active Problem List   Diagnosis Date Noted   History of anemia due to chronic kidney disease 08/29/2022   Pancytopenia (HCC) 08/29/2022   Hypothyroidism    Pain due to onychomycosis of toenails of both feet 03/24/2022   Mitral stenosis 11/16/2021   Weakness 10/06/2020   Sick sinus syndrome (HCC) 07/06/2020   Chronic diastolic heart failure (HCC) 03/12/2020   Atypical atrial flutter (HCC) 03/12/2020   CAD in native artery 03/20/2019   Persistent atrial fibrillation (HCC)    Dyspnea 10/30/2012   Palpitations 10/30/2012   Essential hypertension 10/30/2012   Hyperlipidemia 10/30/2012   Diabetes mellitus type 1 (HCC) 1969    PCP: Verena Mems, MD REFERRING PROVIDER: Margaret Eduard SAUNDERS, MD  REFERRING DIAG:  R42 (ICD-10-CM) - Dizziness  R26.9 (ICD-10-CM) - Gait difficulty   THERAPY DIAG:  Dizziness and giddiness  Unsteadiness on feet  Other abnormalities of gait and mobility  ONSET DATE:  07/04/2023 (MD referral)-symptoms began Feb 2025  Rationale for Evaluation and Treatment: Rehabilitation  SUBJECTIVE:   SUBJECTIVE STATEMENT: Home exercises going well. It was reassuring last time to learn I can move my neck even though it pops.  Pt accompanied by: self  PERTINENT HISTORY: DM, CAD, chronic diastolic heart failure, HTN, HLD, osteoporosis, a-fib, neuropathy (per Dr. 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 No  PRECAUTIONS: Fall  WEIGHT BEARING RESTRICTIONS: No  FALLS: Has patient fallen in last 6 months? No  LIVING ENVIRONMENT: Lives with: lives with their spouse Lives in: House/apartment Stairs: several steps to enter; bedroom on floor Has following equipment at home: Single point cane  PLOF: Independent and Leisure: Enjoys participating in choir  PATIENT GOALS: To help improve things for dizziness, balance  OBJECTIVE:  [facility] Adult PT Treatment:                                                DATE: 08/07/23 Therapeutic Exercise: Arch rising using yoga blocks to leave arch unsupported Great Toe extension standing 2nd-5th digit extension standing Seated neck AROM Neuromuscular re-ed: Compliant surface Lateral stepping up/down from foam Head turns vertical working on maintaining midline Head turns horizontal working on maintaining midline Side stepping Wide squat to weight shift laterally x 10 reps Gaze adaptation Standing horizontal and vertical x 30 seconds Therapeutic Activity: Step ups to 6 step in parallel bars with UE support Large amplitude marching over cones laterally Gait: Gait with emphasis on longer stride and arm swing Slow/fast walking Marching with gait Walking with head turns horiz and vertical   HOME EXERCISE PROGRAM: Access Code: [email or web address] URL: [email or web address] Date: 07/20/2023 Prepared by: [facility] - Outpatient  Rehab - [facility]  Program Notes perform exercises at counter top or in corner for safety  Exercises - Staggered Stance Forward Backward Weight Shift with Counter Support  - 1 x daily - 5 x weekly - 2 sets - 10 reps - Side to Side Weight Shift with Counter Support  - 1 x daily - 5 x weekly - 2 sets - 10 reps - Romberg Stance with Head Nods  - 1 x daily - 5 x weekly - 2-3 sets - 30 sec hold - Romberg Stance with Head Rotation  - 1 x daily - 5 x weekly - 2-3 sets - 30 sec hold - Romberg Stance with  Eyes Closed  - 1 x daily - 5 x weekly - 2-3 sets - 30 sec hold - Alternating Step Taps with Counter Support  - 1 x daily - 7 x weekly - 2 sets - 10 reps - Backward Walking with Counter Support  - 1 x daily - 7 x weekly - 1 sets - 3-5 reps - Standing Cervical Retraction  - 1 x daily - 7 x weekly - 5-10 reps - 3-5 sec hold - Cervical Extension AROM with Strap  - 1 x daily - 7 x weekly - 1-2 sets - 10 reps - Seated Assisted Cervical Rotation with Towel  - 1 x daily - 7 x weekly - 1-2 sets - 10 reps  PATIENT EDUCATION: Education details: HEP review and continue current HEP; counting steps as a means to increase step length and foot clearance with typical paths at home Person educated: Patient Education method: Explanation, Demonstration, and  Verbal cues Education comprehension: verbalized understanding and returned demonstration  ___________________________________________________________________________________ Note: Objective measures were completed at Evaluation unless otherwise noted.  DIAGNOSTIC FINDINGS: MRI unremarkable  COGNITION: Overall cognitive status: Within functional limits for tasks assessed   SENSATION: See above-decreased sensation toes and ankles  POSTURE:  rounded shoulders, forward head, and posterior pelvic tilt  Cervical ROM:   Active A/PROM (deg) eval AROM 08/07/23  Flexion 33 30  Extension 25 35  Right lateral flexion    Left lateral flexion    Right rotation 45 45  Left rotation 50 52  (Blank rows = not tested)  TRANSFERS: Assistive device utilized: BUEs from chair  Sit to stand: Modified independence Stand to sit: Modified independence  GAIT: Gait pattern: step through pattern, decreased step length- Right, decreased step length- Left, and wide BOS Distance walked: 50 ft Assistive device utilized: None Level of assistance: SBA Comments: Decreased push-off with gait  PATIENT SURVEYS:  DHI 28  VESTIBULAR ASSESSMENT:  GENERAL OBSERVATION: No  acute distress   SYMPTOM BEHAVIOR:  Subjective history: Dizziness has been going on for several months, more of an off-balance feeling.  Denies falls, hx of migraines. Does have hx of diabetic retinopathy, neuropathy.  Has had vertigo in the past and this is different.   Non-Vestibular symptoms: neck tightness  Type of dizziness: Imbalance (Disequilibrium), Unsteady with head/body turns, and Swimmyheaded  Frequency: daily  Duration: comes and goes  Aggravating factors: Induced by motion: looking up at the ceiling, bending down to the ground, turning body quickly, and turning head quickly  Relieving factors: slow movements and hold onto something  Progression of symptoms: better  OCULOMOTOR EXAM:  Wears progressive lenses  Ocular Alignment: normal  Ocular ROM: No Limitations  Spontaneous Nystagmus: absent  Gaze-Induced Nystagmus: slight extra L eye movements at end range   Smooth Pursuits: intact  Saccades: intact  Convergence/Divergence: unable; L eye abducts about 3-4 cm   VESTIBULAR - OCULAR REFLEX:   Slow VOR: Comment: mild symptoms of dizziness (4/10) and neck tightness; mild dizziness (4/10) and neck tightness  VOR Cancellation: Comment: no dizziness, just neck tightness  Head-Impulse Test: HIT Right: positive HIT Left: negative  Dynamic Visual Acuity: NT   POSITIONAL TESTING: Other: NT at eval due to pt not reporting any dizziness, spinning sensations  MOTION SENSITIVITY: NT at EVAL  Motion Sensitivity Quotient Intensity: 0 = none, 1 = Lightheaded, 2 = Mild, 3 = Moderate, 4 = Severe, 5 = Vomiting  Intensity  1. Sitting to supine   2. Supine to L side   3. Supine to R side   4. Supine to sitting   5. L Hallpike-Dix   6. Up from L    7. R Hallpike-Dix   8. Up from R    9. Sitting, head tipped to L knee   10. Head up from L knee   11. Sitting, head tipped to R knee   12. Head up from R knee   13. Sitting head turns x5   14.Sitting head nods x5   15. In stance,  180 turn to L    16. In stance, 180 turn to R     OTHOSTATICS: not done  FUNCTIONAL GAIT: 10 meter walk test: 18.78 sec = 1.75 ft/sec   M-CTSIB  Condition 1: Firm Surface, EO 30 Sec, Normal Sway  Condition 2: Firm Surface, EC 30 Sec, Mild Sway  Condition 3: Foam Surface, EO 30 Sec, Mild Sway  Condition 4: Foam Surface, EC 2.75  Sec, Severe Sway     _________________________________________________________________________________________                                                                                                                          GOALS: Goals reviewed with patient? Yes  SHORT TERM GOALS: Target date: 08/10/2023  Pt will be independent with HEP for improved dizziness, balance. Baseline: Goal status: IN PROGRESS  2.  Pt will perform Condition 4 on MCTSIB for at least 15 sec, for improved balance. Baseline:  Goal status: IN PROGRESS  3.  Pt will improve cervical range of motion in all directions by 5 degrees with no c/o increased dizziness for improved functional mboility.  Baseline:  Goal status: IN PROGRESS  LONG TERM GOALS: Target date: 08/24/2023  Pt will be independent with HEP for improved dizziness and balance.. Baseline:  Goal status: IN PROGRESS  2.  DGI score to be assessed, to improve to at least 19/24 for decreased fall risk. Baseline: 9/24 per note 07/17/23 Goal status: IN PROGRESS  3.  Pt will perform Condition 4 on MCTSIB to at least 30 sec mod sway or less, for improved balance. Baseline: 2.75 sec Goal status: IN PROGRESS  4.  Pt will improve DHI score to less than or equal to 10, to demo decreased dizziness affecting daily activities. Baseline: 28 Goal status: IN PROGRESS  5.  Pt will improve gait velocity to at least 2 ft/sec for improved gait efficiency and safety. Baseline: 1.75 ft/sec Goal status: IN PROGRESS   ASSESSMENT:  CLINICAL IMPRESSION: The patient is tolerating continued progression of balance, gaze, and  dynamic gait activities. PT continuing to work on foot postioning and neck ROM as well. PT to check STGs next session and continue working towards LTGs.   OBJECTIVE IMPAIRMENTS: Abnormal gait, decreased balance, decreased mobility, difficulty walking, decreased strength, dizziness, and postural dysfunction.   PLAN:  PT FREQUENCY: 2x/week  PT DURATION: 6 weeks plus eval  PLANNED INTERVENTIONS: 97750- Physical Performance Testing, 97110-Therapeutic exercises, 97530- Therapeutic activity, [id number]- Neuromuscular re-education, 97535- Self Care, [id number]- Manual therapy, [phone]- Gait training, Patient/Family education, Balance training, and Vestibular training  PLAN FOR NEXT SESSION: Continue gait with turns (may need to do this with cane for optimal safety). Weight shifting and gait training to address shuffling/longer step length; work on Editor, commissioning, VOR exercises.  CHECK GOALS for STG check   Syair Fricker, PT 08/07/23 10:20 AM [facility] at [facility] [address] [place], [address] [place], [place] [address] Phone # [phone] Fax # [phone]

## 2023-08-09 NOTE — Therapy (Signed)
 OUTPATIENT PHYSICAL THERAPY VESTIBULAR TREATMENT   Patient Name: Courtney Pearson MRN: [id number] DOB:05-12-1950, 73 y.o., female Today's Date: 08/10/2023  END OF SESSION:  PT End of Session - 08/10/23 1057     Visit Number 8    Number of Visits 13    Date for PT Re-Evaluation 08/24/23    Authorization Type Medicare/[employer]    Progress Note Due on Visit 10    PT Start Time 1017    PT Stop Time 1059    PT Time Calculation (min) 42 min    Equipment Utilized During Treatment Gait belt    Activity Tolerance Patient tolerated treatment well    Behavior During Therapy WFL for tasks assessed/performed          Past Medical History:  Diagnosis Date   Acute on chronic diastolic heart failure (HCC) 03/12/2020   Atypical atrial flutter (HCC) 03/12/2020   CAD in native artery 03/20/2019   Chronic diastolic heart failure (HCC) 03/12/2020   Diabetes mellitus type 1 (HCC) 1969   History of anemia due to chronic kidney disease 08/29/2022   HTN (hypertension)    Hyperlipidemia    Hypothyroidism    Mitral stenosis 11/16/2021   Osteoporosis 2024   Palpitation    Pancytopenia (HCC) 08/29/2022   Persistent atrial fibrillation (HCC) 04/03/2017   Renal insufficiency    Sick sinus syndrome (HCC) 07/06/2020   Weakness 10/06/2020   Past Surgical History:  Procedure Laterality Date   ATRIAL FIBRILLATION ABLATION N/A 01/01/2018   Procedure: ATRIAL FIBRILLATION ABLATION;  Surgeon: Kelsie Agent, MD;  Location: [facility];  Service: Cardiovascular;  Laterality: N/A;   ATRIAL FIBRILLATION ABLATION N/A 10/31/2018   Procedure: ATRIAL FIBRILLATION ABLATION;  Surgeon: Kelsie Agent, MD;  Location: [facility];  Service: Cardiovascular;  Laterality: N/A;   ATRIAL FIBRILLATION ABLATION N/A 01/06/2021   Procedure: ATRIAL FIBRILLATION ABLATION;  Surgeon: Kelsie Agent, MD;  Location: [facility];  Service: Cardiovascular;  Laterality: N/A;   BREAST BIOPSY     BREAST EXCISIONAL  BIOPSY Right 1993   CARDIOVERSION N/A 04/30/2017   Procedure: CARDIOVERSION;  Surgeon: Raford Riggs, MD;  Location: [facility] ENDOSCOPY;  Service: Cardiovascular;  Laterality: N/A;   CARDIOVERSION N/A 02/15/2018   Procedure: CARDIOVERSION;  Surgeon: Raford Riggs, MD;  Location: [facility] ENDOSCOPY;  Service: Cardiovascular;  Laterality: N/A;   CARDIOVERSION N/A 04/10/2018   Procedure: CARDIOVERSION;  Surgeon: Delford Maude BROCKS, MD;  Location: [facility] ENDOSCOPY;  Service: Cardiovascular;  Laterality: N/A;   CESAREAN SECTION     EYE SURGERY     Patient Active Problem List   Diagnosis Date Noted   History of anemia due to chronic kidney disease 08/29/2022   Pancytopenia (HCC) 08/29/2022   Hypothyroidism    Pain due to onychomycosis of toenails of both feet 03/24/2022   Mitral stenosis 11/16/2021   Weakness 10/06/2020   Sick sinus syndrome (HCC) 07/06/2020   Chronic diastolic heart failure (HCC) 03/12/2020   Atypical atrial flutter (HCC) 03/12/2020   CAD in native artery 03/20/2019   Persistent atrial fibrillation (HCC)    Dyspnea 10/30/2012   Palpitations 10/30/2012   Essential hypertension 10/30/2012   Hyperlipidemia 10/30/2012   Diabetes mellitus type 1 (HCC) 1969    PCP: Verena Mems, MD REFERRING PROVIDER: Margaret Eduard SAUNDERS, MD  REFERRING DIAG:  R42 (ICD-10-CM) - Dizziness  R26.9 (ICD-10-CM) - Gait difficulty   THERAPY DIAG:  Dizziness and giddiness  Unsteadiness on feet  Other abnormalities of gait and mobility  ONSET  DATE: 07/04/2023 (MD referral)-symptoms began Feb 2025  Rationale for Evaluation and Treatment: Rehabilitation  SUBJECTIVE:   SUBJECTIVE STATEMENT: Been trying to be more aware of things with PT.  Pt accompanied by: self  PERTINENT HISTORY: DM, CAD, chronic diastolic heart failure, HTN, HLD, osteoporosis, a-fib, neuropathy (per Dr. 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 Yes: NPRS scale: 1/10 Pain  location: R foot Pain description: tenderness  Aggravating factors: depends on how much weight I put Relieving factors: nothing  PRECAUTIONS: Fall  WEIGHT BEARING RESTRICTIONS: No  FALLS: Has patient fallen in last 6 months? No  LIVING ENVIRONMENT: Lives with: lives with their spouse Lives in: House/apartment Stairs: several steps to enter; bedroom on floor Has following equipment at home: Single point cane  PLOF: Independent and Leisure: Enjoys participating in choir  PATIENT GOALS: To help improve things for dizziness, balance  OBJECTIVE:      TODAY'S TREATMENT: 08/10/23 Activity Comments  MCTSIB #4 16 sec with mod sway  Cervical rotation SNAG Required correction of hand positioning for more effective stretching   Cervical extension SNAG  Provided demo and instruction on technique   gait with pivot turns, figure 8 turns Cueing for longer, more continueout steps and to maintain therapist's walking pace. CGA and pt reports hesitancy   standing shift+ cross body reach to touch targets on therapy poles Marked difficulty with bending down and to the R; heavy cueing for wt shifting. Pt reports some neck fatigue after this activity           CERVICAL ROM:   Active ROM AROM (deg) eval  Flexion 53  Extension 23  Right lateral flexion 27  Left lateral flexion 20  Right rotation 43  Left rotation 49   (Blank rows = not tested)     PATIENT EDUCATION: Education details: edu on progress towards goals and remaining impairments  Person educated: Patient Education method: Explanation Education comprehension: verbalized understanding     HOME EXERCISE PROGRAM: Access Code: [email or web address] URL: [email or web address] Date: 07/20/2023 Prepared by: [facility] - Outpatient  Rehab - [facility]  Program Notes perform exercises at counter top or in corner for safety  Exercises - Staggered Stance Forward Backward Weight Shift with Counter Support  - 1 x  daily - 5 x weekly - 2 sets - 10 reps - Side to Side Weight Shift with Counter Support  - 1 x daily - 5 x weekly - 2 sets - 10 reps - Romberg Stance with Head Nods  - 1 x daily - 5 x weekly - 2-3 sets - 30 sec hold - Romberg Stance with Head Rotation  - 1 x daily - 5 x weekly - 2-3 sets - 30 sec hold - Romberg Stance with Eyes Closed  - 1 x daily - 5 x weekly - 2-3 sets - 30 sec hold - Alternating Step Taps with Counter Support  - 1 x daily - 7 x weekly - 2 sets - 10 reps - Backward Walking with Counter Support  - 1 x daily - 7 x weekly - 1 sets - 3-5 reps - Standing Cervical Retraction  - 1 x daily - 7 x weekly - 5-10 reps - 3-5 sec hold - Cervical Extension AROM with Strap  - 1 x daily - 7 x weekly - 1-2 sets - 10 reps - Seated Assisted Cervical Rotation with Towel  - 1 x daily - 7 x weekly - 1-2 sets - 10  reps   ___________________________________________________________________________________ Note: Objective measures were completed at Evaluation unless otherwise noted.  DIAGNOSTIC FINDINGS: MRI unremarkable  COGNITION: Overall cognitive status: Within functional limits for tasks assessed   SENSATION: See above-decreased sensation toes and ankles  POSTURE:  rounded shoulders, forward head, and posterior pelvic tilt  Cervical ROM:   Active A/PROM (deg) eval AROM 08/07/23  Flexion 33 30  Extension 25 35  Right lateral flexion    Left lateral flexion    Right rotation 45 45  Left rotation 50 52  (Blank rows = not tested)  TRANSFERS: Assistive device utilized: BUEs from chair  Sit to stand: Modified independence Stand to sit: Modified independence  GAIT: Gait pattern: step through pattern, decreased step length- Right, decreased step length- Left, and wide BOS Distance walked: 50 ft Assistive device utilized: None Level of assistance: SBA Comments: Decreased push-off with gait  PATIENT SURVEYS:  DHI 28  VESTIBULAR ASSESSMENT:  GENERAL OBSERVATION: No acute  distress   SYMPTOM BEHAVIOR:  Subjective history: Dizziness has been going on for several months, more of an off-balance feeling.  Denies falls, hx of migraines. Does have hx of diabetic retinopathy, neuropathy.  Has had vertigo in the past and this is different.   Non-Vestibular symptoms: neck tightness  Type of dizziness: Imbalance (Disequilibrium), Unsteady with head/body turns, and Swimmyheaded  Frequency: daily  Duration: comes and goes  Aggravating factors: Induced by motion: looking up at the ceiling, bending down to the ground, turning body quickly, and turning head quickly  Relieving factors: slow movements and hold onto something  Progression of symptoms: better  OCULOMOTOR EXAM:  Wears progressive lenses  Ocular Alignment: normal  Ocular ROM: No Limitations  Spontaneous Nystagmus: absent  Gaze-Induced Nystagmus: slight extra L eye movements at end range   Smooth Pursuits: intact  Saccades: intact  Convergence/Divergence: unable; L eye abducts about 3-4 cm   VESTIBULAR - OCULAR REFLEX:   Slow VOR: Comment: mild symptoms of dizziness (4/10) and neck tightness; mild dizziness (4/10) and neck tightness  VOR Cancellation: Comment: no dizziness, just neck tightness  Head-Impulse Test: HIT Right: positive HIT Left: negative  Dynamic Visual Acuity: NT   POSITIONAL TESTING: Other: NT at eval due to pt not reporting any dizziness, spinning sensations  MOTION SENSITIVITY: NT at EVAL  Motion Sensitivity Quotient Intensity: 0 = none, 1 = Lightheaded, 2 = Mild, 3 = Moderate, 4 = Severe, 5 = Vomiting  Intensity  1. Sitting to supine   2. Supine to L side   3. Supine to R side   4. Supine to sitting   5. L Hallpike-Dix   6. Up from L    7. R Hallpike-Dix   8. Up from R    9. Sitting, head tipped to L knee   10. Head up from L knee   11. Sitting, head tipped to R knee   12. Head up from R knee   13. Sitting head turns x5   14.Sitting head nods x5   15. In stance, 180 turn  to L    16. In stance, 180 turn to R     OTHOSTATICS: not done  FUNCTIONAL GAIT: 10 meter walk test: 18.78 sec = 1.75 ft/sec   M-CTSIB  Condition 1: Firm Surface, EO 30 Sec, Normal Sway  Condition 2: Firm Surface, EC 30 Sec, Mild Sway  Condition 3: Foam Surface, EO 30 Sec, Mild Sway  Condition 4: Foam Surface, EC 2.75 Sec, Severe Sway  _________________________________________________________________________________________                                                                                                                          GOALS: Goals reviewed with patient? Yes  SHORT TERM GOALS: Target date: 08/10/2023  Pt will be independent with HEP for improved dizziness, balance. Baseline:  Goal status: MET 08/10/23  2.  Pt will perform Condition 4 on MCTSIB for at least 15 sec, for improved balance. Baseline: 16 sec 08/10/23 Goal status: MET 08/10/23  3.  Pt will improve cervical range of motion in all directions by 5 degrees with no c/o increased dizziness for improved functional mboility.  Baseline: improved flexion but still limited 08/10/23  Goal status: NOT MET 08/10/23  LONG TERM GOALS: Target date: 08/24/2023  Pt will be independent with HEP for improved dizziness and balance.. Baseline:  Goal status: IN PROGRESS  2.  DGI score to be assessed, to improve to at least 19/24 for decreased fall risk. Baseline: 9/24 per note 07/17/23 Goal status: IN PROGRESS  3.  Pt will perform Condition 4 on MCTSIB to at least 30 sec mod sway or less, for improved balance. Baseline: 2.75 sec; 16 sec 08/10/23 Goal status: IN PROGRESS 08/10/23   4.  Pt will improve DHI score to less than or equal to 10, to demo decreased dizziness affecting daily activities. Baseline: 28 Goal status: IN PROGRESS  5.  Pt will improve gait velocity to at least 2 ft/sec for improved gait efficiency and safety. Baseline: 1.75 ft/sec Goal status: IN PROGRESS   ASSESSMENT:  CLINICAL  IMPRESSION: Patient arrived to session without complaints. STGs revealed improvement in cervical flexion and compliant surface balance. Reviewed cervical HEP for max understanding. Worked on balance training incorporating turns and cross body reaching to encouraged improved balance confidence and effective weight shifting. Patient tolerated session well and without complaints upon leaving.   OBJECTIVE IMPAIRMENTS: Abnormal gait, decreased balance, decreased mobility, difficulty walking, decreased strength, dizziness, and postural dysfunction.   PLAN:  PT FREQUENCY: 2x/week  PT DURATION: 6 weeks plus eval  PLANNED INTERVENTIONS: 97750- Physical Performance Testing, 97110-Therapeutic exercises, 97530- Therapeutic activity, [id number]- Neuromuscular re-education, 97535- Self Care, [id number]- Manual therapy, [phone]- Gait training, Patient/Family education, Balance training, and Vestibular training  PLAN FOR NEXT SESSION: Continue gait with turns (may need to do this with cane for optimal safety). Weight shifting and gait training to address shuffling/longer step length; work on Editor, commissioning, VOR exercises.    Louana Terrilyn Christians, PT, DPT 08/10/23 11:02 AM  [facility] at [facility] [address] [place], [address] [place], [place] [address] Phone # [phone] Fax # [phone]

## 2023-08-10 ENCOUNTER — Ambulatory Visit: Admitting: Physical Therapy

## 2023-08-10 ENCOUNTER — Encounter: Payer: Self-pay | Admitting: Physical Therapy

## 2023-08-10 DIAGNOSIS — R2681 Unsteadiness on feet: Secondary | ICD-10-CM | POA: Diagnosis not present

## 2023-08-10 DIAGNOSIS — R2689 Other abnormalities of gait and mobility: Secondary | ICD-10-CM | POA: Diagnosis not present

## 2023-08-10 DIAGNOSIS — R42 Dizziness and giddiness: Secondary | ICD-10-CM | POA: Diagnosis not present

## 2023-08-14 ENCOUNTER — Ambulatory Visit

## 2023-08-14 DIAGNOSIS — R2681 Unsteadiness on feet: Secondary | ICD-10-CM

## 2023-08-14 DIAGNOSIS — R42 Dizziness and giddiness: Secondary | ICD-10-CM

## 2023-08-14 DIAGNOSIS — R2689 Other abnormalities of gait and mobility: Secondary | ICD-10-CM

## 2023-08-14 NOTE — Therapy (Signed)
 OUTPATIENT PHYSICAL THERAPY VESTIBULAR TREATMENT   Patient Name: Courtney Pearson MRN: [id number] DOB:12/19/1950, 73 y.o., female Today's Date: 08/14/2023  END OF SESSION:  PT End of Session - 08/14/23 1021     Visit Number 9    Number of Visits 13    Date for PT Re-Evaluation 08/24/23    Authorization Type Medicare/[employer]    Progress Note Due on Visit 10    PT Start Time 1020    PT Stop Time 1100    PT Time Calculation (min) 40 min    Equipment Utilized During Treatment Gait belt    Activity Tolerance Patient tolerated treatment well    Behavior During Therapy WFL for tasks assessed/performed          Past Medical History:  Diagnosis Date   Acute on chronic diastolic heart failure (HCC) 03/12/2020   Atypical atrial flutter (HCC) 03/12/2020   CAD in native artery 03/20/2019   Chronic diastolic heart failure (HCC) 03/12/2020   Diabetes mellitus type 1 (HCC) 1969   History of anemia due to chronic kidney disease 08/29/2022   HTN (hypertension)    Hyperlipidemia    Hypothyroidism    Mitral stenosis 11/16/2021   Osteoporosis 2024   Palpitation    Pancytopenia (HCC) 08/29/2022   Persistent atrial fibrillation (HCC) 04/03/2017   Renal insufficiency    Sick sinus syndrome (HCC) 07/06/2020   Weakness 10/06/2020   Past Surgical History:  Procedure Laterality Date   ATRIAL FIBRILLATION ABLATION N/A 01/01/2018   Procedure: ATRIAL FIBRILLATION ABLATION;  Surgeon: Kelsie Agent, MD;  Location: [facility];  Service: Cardiovascular;  Laterality: N/A;   ATRIAL FIBRILLATION ABLATION N/A 10/31/2018   Procedure: ATRIAL FIBRILLATION ABLATION;  Surgeon: Kelsie Agent, MD;  Location: [facility];  Service: Cardiovascular;  Laterality: N/A;   ATRIAL FIBRILLATION ABLATION N/A 01/06/2021   Procedure: ATRIAL FIBRILLATION ABLATION;  Surgeon: Kelsie Agent, MD;  Location: [facility];  Service: Cardiovascular;  Laterality: N/A;   BREAST BIOPSY     BREAST EXCISIONAL  BIOPSY Right 1993   CARDIOVERSION N/A 04/30/2017   Procedure: CARDIOVERSION;  Surgeon: Raford Riggs, MD;  Location: [facility] ENDOSCOPY;  Service: Cardiovascular;  Laterality: N/A;   CARDIOVERSION N/A 02/15/2018   Procedure: CARDIOVERSION;  Surgeon: Raford Riggs, MD;  Location: [facility] ENDOSCOPY;  Service: Cardiovascular;  Laterality: N/A;   CARDIOVERSION N/A 04/10/2018   Procedure: CARDIOVERSION;  Surgeon: Delford Maude BROCKS, MD;  Location: [facility] ENDOSCOPY;  Service: Cardiovascular;  Laterality: N/A;   CESAREAN SECTION     EYE SURGERY     Patient Active Problem List   Diagnosis Date Noted   History of anemia due to chronic kidney disease 08/29/2022   Pancytopenia (HCC) 08/29/2022   Hypothyroidism    Pain due to onychomycosis of toenails of both feet 03/24/2022   Mitral stenosis 11/16/2021   Weakness 10/06/2020   Sick sinus syndrome (HCC) 07/06/2020   Chronic diastolic heart failure (HCC) 03/12/2020   Atypical atrial flutter (HCC) 03/12/2020   CAD in native artery 03/20/2019   Persistent atrial fibrillation (HCC)    Dyspnea 10/30/2012   Palpitations 10/30/2012   Essential hypertension 10/30/2012   Hyperlipidemia 10/30/2012   Diabetes mellitus type 1 (HCC) 1969    PCP: Verena Mems, MD REFERRING PROVIDER: Margaret Eduard SAUNDERS, MD  REFERRING DIAG:  R42 (ICD-10-CM) - Dizziness  R26.9 (ICD-10-CM) - Gait difficulty   THERAPY DIAG:  Dizziness and giddiness  Unsteadiness on feet  Other abnormalities of gait and mobility  ONSET  DATE: 07/04/2023 (MD referral)-symptoms began Feb 2025  Rationale for Evaluation and Treatment: Rehabilitation  SUBJECTIVE:   SUBJECTIVE STATEMENT: Doing alright, note ongoing imbalance w/ head movements  Pt accompanied by: self  PERTINENT HISTORY: DM, CAD, chronic diastolic heart failure, HTN, HLD, osteoporosis, a-fib, neuropathy (per Dr. 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 Yes: NPRS scale:  1/10 Pain location: R foot Pain description: tenderness  Aggravating factors: depends on how much weight I put Relieving factors: nothing  PRECAUTIONS: Fall  WEIGHT BEARING RESTRICTIONS: No  FALLS: Has patient fallen in last 6 months? No  LIVING ENVIRONMENT: Lives with: lives with their spouse Lives in: House/apartment Stairs: several steps to enter; bedroom on floor Has following equipment at home: Single point cane  PLOF: Independent and Leisure: Enjoys participating in choir  PATIENT GOALS: To help improve things for dizziness, balance  OBJECTIVE:    TODAY'S TREATMENT: 08/14/23 Activity Comments  Sidestepping x 2 min Along counter  Forward T at counter 2x8 Transferring cones  Single limb support 3x30 sec Foot elevated on 2 step, other foot floating off to side--finger tip support at counter  Static multisensory balance Difficulty w/ eyes closed conditions  Dynamic balance Negotiating obstacles, retrowalk, head turns e.g.         TODAY'S TREATMENT: 08/10/23 Activity Comments  MCTSIB #4 16 sec with mod sway  Cervical rotation SNAG Required correction of hand positioning for more effective stretching   Cervical extension SNAG  Provided demo and instruction on technique   gait with pivot turns, figure 8 turns Cueing for longer, more continueout steps and to maintain therapist's walking pace. CGA and pt reports hesitancy   standing shift+ cross body reach to touch targets on therapy poles Marked difficulty with bending down and to the R; heavy cueing for wt shifting. Pt reports some neck fatigue after this activity           CERVICAL ROM:   Active ROM AROM (deg) eval  Flexion 53  Extension 23  Right lateral flexion 27  Left lateral flexion 20  Right rotation 43  Left rotation 49   (Blank rows = not tested)     PATIENT EDUCATION: Education details: edu on progress towards goals and remaining impairments  Person educated: Patient Education method:  Explanation Education comprehension: verbalized understanding     HOME EXERCISE PROGRAM: Access Code: [email or web address] URL: [email or web address] Date: 07/20/2023 Prepared by: [facility] - Outpatient  Rehab - [facility]  Program Notes perform exercises at counter top or in corner for safety  Exercises - Staggered Stance Forward Backward Weight Shift with Counter Support  - 1 x daily - 5 x weekly - 2 sets - 10 reps - Side to Side Weight Shift with Counter Support  - 1 x daily - 5 x weekly - 2 sets - 10 reps - Romberg Stance with Head Nods  - 1 x daily - 5 x weekly - 2-3 sets - 30 sec hold - Romberg Stance with Head Rotation  - 1 x daily - 5 x weekly - 2-3 sets - 30 sec hold - Romberg Stance with Eyes Closed  - 1 x daily - 5 x weekly - 2-3 sets - 30 sec hold - Alternating Step Taps with Counter Support  - 1 x daily - 7 x weekly - 2 sets - 10 reps - Backward Walking with Counter Support  - 1 x daily - 7 x weekly - 1 sets - 3-5  reps - Standing Cervical Retraction  - 1 x daily - 7 x weekly - 5-10 reps - 3-5 sec hold - Cervical Extension AROM with Strap  - 1 x daily - 7 x weekly - 1-2 sets - 10 reps - Seated Assisted Cervical Rotation with Towel  - 1 x daily - 7 x weekly - 1-2 sets - 10 reps   ___________________________________________________________________________________ Note: Objective measures were completed at Evaluation unless otherwise noted.  DIAGNOSTIC FINDINGS: MRI unremarkable  COGNITION: Overall cognitive status: Within functional limits for tasks assessed   SENSATION: See above-decreased sensation toes and ankles  POSTURE:  rounded shoulders, forward head, and posterior pelvic tilt  Cervical ROM:   Active A/PROM (deg) eval AROM 08/07/23  Flexion 33 30  Extension 25 35  Right lateral flexion    Left lateral flexion    Right rotation 45 45  Left rotation 50 52  (Blank rows = not tested)  TRANSFERS: Assistive device utilized: BUEs from chair   Sit to stand: Modified independence Stand to sit: Modified independence  GAIT: Gait pattern: step through pattern, decreased step length- Right, decreased step length- Left, and wide BOS Distance walked: 50 ft Assistive device utilized: None Level of assistance: SBA Comments: Decreased push-off with gait  PATIENT SURVEYS:  DHI 28  VESTIBULAR ASSESSMENT:  GENERAL OBSERVATION: No acute distress   SYMPTOM BEHAVIOR:  Subjective history: Dizziness has been going on for several months, more of an off-balance feeling.  Denies falls, hx of migraines. Does have hx of diabetic retinopathy, neuropathy.  Has had vertigo in the past and this is different.   Non-Vestibular symptoms: neck tightness  Type of dizziness: Imbalance (Disequilibrium), Unsteady with head/body turns, and Swimmyheaded  Frequency: daily  Duration: comes and goes  Aggravating factors: Induced by motion: looking up at the ceiling, bending down to the ground, turning body quickly, and turning head quickly  Relieving factors: slow movements and hold onto something  Progression of symptoms: better  OCULOMOTOR EXAM:  Wears progressive lenses  Ocular Alignment: normal  Ocular ROM: No Limitations  Spontaneous Nystagmus: absent  Gaze-Induced Nystagmus: slight extra L eye movements at end range   Smooth Pursuits: intact  Saccades: intact  Convergence/Divergence: unable; L eye abducts about 3-4 cm   VESTIBULAR - OCULAR REFLEX:   Slow VOR: Comment: mild symptoms of dizziness (4/10) and neck tightness; mild dizziness (4/10) and neck tightness  VOR Cancellation: Comment: no dizziness, just neck tightness  Head-Impulse Test: HIT Right: positive HIT Left: negative  Dynamic Visual Acuity: NT   POSITIONAL TESTING: Other: NT at eval due to pt not reporting any dizziness, spinning sensations  MOTION SENSITIVITY: NT at EVAL  Motion Sensitivity Quotient Intensity: 0 = none, 1 = Lightheaded, 2 = Mild, 3 = Moderate, 4 = Severe, 5  = Vomiting  Intensity  1. Sitting to supine   2. Supine to L side   3. Supine to R side   4. Supine to sitting   5. L Hallpike-Dix   6. Up from L    7. R Hallpike-Dix   8. Up from R    9. Sitting, head tipped to L knee   10. Head up from L knee   11. Sitting, head tipped to R knee   12. Head up from R knee   13. Sitting head turns x5   14.Sitting head nods x5   15. In stance, 180 turn to L    16. In stance, 180 turn to R  OTHOSTATICS: not done  FUNCTIONAL GAIT: 10 meter walk test: 18.78 sec = 1.75 ft/sec   M-CTSIB  Condition 1: Firm Surface, EO 30 Sec, Normal Sway  Condition 2: Firm Surface, EC 30 Sec, Mild Sway  Condition 3: Foam Surface, EO 30 Sec, Mild Sway  Condition 4: Foam Surface, EC 2.75 Sec, Severe Sway     _________________________________________________________________________________________                                                                                                                          GOALS: Goals reviewed with patient? Yes  SHORT TERM GOALS: Target date: 08/10/2023  Pt will be independent with HEP for improved dizziness, balance. Baseline:  Goal status: MET 08/10/23  2.  Pt will perform Condition 4 on MCTSIB for at least 15 sec, for improved balance. Baseline: 16 sec 08/10/23 Goal status: MET 08/10/23  3.  Pt will improve cervical range of motion in all directions by 5 degrees with no c/o increased dizziness for improved functional mboility.  Baseline: improved flexion but still limited 08/10/23  Goal status: NOT MET 08/10/23  LONG TERM GOALS: Target date: 08/24/2023  Pt will be independent with HEP for improved dizziness and balance.. Baseline:  Goal status: IN PROGRESS  2.  DGI score to be assessed, to improve to at least 19/24 for decreased fall risk. Baseline: 9/24 per note 07/17/23 Goal status: IN PROGRESS  3.  Pt will perform Condition 4 on MCTSIB to at least 30 sec mod sway or less, for improved balance. Baseline:  2.75 sec; 16 sec 08/10/23 Goal status: IN PROGRESS 08/10/23   4.  Pt will improve DHI score to less than or equal to 10, to demo decreased dizziness affecting daily activities. Baseline: 28 Goal status: IN PROGRESS  5.  Pt will improve gait velocity to at least 2 ft/sec for improved gait efficiency and safety. Baseline: 1.75 ft/sec Goal status: IN PROGRESS   ASSESSMENT:  CLINICAL IMPRESSION: Continued with balance activities to improve postural stability with progress to miultisensory conditions w/ difficulty under eyes closed conditions on firm/compliant. Dynamic balance activities to facilitate righting reactions and weight shifting for single limb support.  Large amplitude movements of head/body to gauge tolerance and see if this is provoking, denies any obvious vestibular symptoms with these movements but notes feeling of imbalance with these demands reqiuring CGA for support. Continued sessions to progress POC details to improve mobility and reduce riskf ro falls  OBJECTIVE IMPAIRMENTS: Abnormal gait, decreased balance, decreased mobility, difficulty walking, decreased strength, dizziness, and postural dysfunction.   PLAN:  PT FREQUENCY: 2x/week  PT DURATION: 6 weeks plus eval  PLANNED INTERVENTIONS: [id number]- Physical Performance Testing, 97110-Therapeutic exercises, 97530- Therapeutic activity, [id number]- Neuromuscular re-education, 97535- Self Care, [id number]- Manual therapy, [phone]- Gait training, Patient/Family education, Balance training, and Vestibular training  PLAN FOR NEXT SESSION: Continue gait with turns (may need to do this with cane for optimal safety). Weight shifting and gait training to address shuffling/longer step  length; work on Editor, commissioning, VOR exercises.   11:04 AM, 08/14/23 M. Kelly Kashish Yglesias, PT, DPT Physical Therapist- [employer] Office Number: [phone]

## 2023-08-16 NOTE — Therapy (Signed)
 OUTPATIENT PHYSICAL THERAPY VESTIBULAR PROGRESS NOTE   Patient Name: Courtney Pearson MRN: [id number] DOB:Jun 09, 1950, 73 y.o., female Today's Date: 08/17/2023   Progress Note Reporting Period 07/12/23 to 08/17/23  See note below for Objective Data and Assessment of Progress/Goals.      END OF SESSION:  PT End of Session - 08/17/23 1057     Visit Number 10    Number of Visits 13    Date for PT Re-Evaluation 08/24/23    Authorization Type Medicare/[employer]    Progress Note Due on Visit 10    PT Start Time 1016    PT Stop Time 1057    PT Time Calculation (min) 41 min    Equipment Utilized During Treatment Gait belt    Activity Tolerance Patient tolerated treatment well    Behavior During Therapy WFL for tasks assessed/performed           Past Medical History:  Diagnosis Date   Acute on chronic diastolic heart failure (HCC) 03/12/2020   Atypical atrial flutter (HCC) 03/12/2020   CAD in native artery 03/20/2019   Chronic diastolic heart failure (HCC) 03/12/2020   Diabetes mellitus type 1 (HCC) 1969   History of anemia due to chronic kidney disease 08/29/2022   HTN (hypertension)    Hyperlipidemia    Hypothyroidism    Mitral stenosis 11/16/2021   Osteoporosis 2024   Palpitation    Pancytopenia (HCC) 08/29/2022   Persistent atrial fibrillation (HCC) 04/03/2017   Renal insufficiency    Sick sinus syndrome (HCC) 07/06/2020   Weakness 10/06/2020   Past Surgical History:  Procedure Laterality Date   ATRIAL FIBRILLATION ABLATION N/A 01/01/2018   Procedure: ATRIAL FIBRILLATION ABLATION;  Surgeon: Kelsie Agent, MD;  Location: [facility];  Service: Cardiovascular;  Laterality: N/A;   ATRIAL FIBRILLATION ABLATION N/A 10/31/2018   Procedure: ATRIAL FIBRILLATION ABLATION;  Surgeon: Kelsie Agent, MD;  Location: [facility];  Service: Cardiovascular;  Laterality: N/A;   ATRIAL FIBRILLATION ABLATION N/A 01/06/2021   Procedure: ATRIAL FIBRILLATION ABLATION;   Surgeon: Kelsie Agent, MD;  Location: [facility];  Service: Cardiovascular;  Laterality: N/A;   BREAST BIOPSY     BREAST EXCISIONAL BIOPSY Right 1993   CARDIOVERSION N/A 04/30/2017   Procedure: CARDIOVERSION;  Surgeon: Raford Riggs, MD;  Location: [facility] ENDOSCOPY;  Service: Cardiovascular;  Laterality: N/A;   CARDIOVERSION N/A 02/15/2018   Procedure: CARDIOVERSION;  Surgeon: Raford Riggs, MD;  Location: [facility] ENDOSCOPY;  Service: Cardiovascular;  Laterality: N/A;   CARDIOVERSION N/A 04/10/2018   Procedure: CARDIOVERSION;  Surgeon: Delford Maude BROCKS, MD;  Location: [facility] ENDOSCOPY;  Service: Cardiovascular;  Laterality: N/A;   CESAREAN SECTION     EYE SURGERY     Patient Active Problem List   Diagnosis Date Noted   History of anemia due to chronic kidney disease 08/29/2022   Pancytopenia (HCC) 08/29/2022   Hypothyroidism    Pain due to onychomycosis of toenails of both feet 03/24/2022   Mitral stenosis 11/16/2021   Weakness 10/06/2020   Sick sinus syndrome (HCC) 07/06/2020   Chronic diastolic heart failure (HCC) 03/12/2020   Atypical atrial flutter (HCC) 03/12/2020   CAD in native artery 03/20/2019   Persistent atrial fibrillation (HCC)    Dyspnea 10/30/2012   Palpitations 10/30/2012   Essential hypertension 10/30/2012   Hyperlipidemia 10/30/2012   Diabetes mellitus type 1 (HCC) 1969    PCP: Verena Mems, MD REFERRING PROVIDER: Margaret Eduard SAUNDERS, MD  REFERRING DIAG:  R42 (ICD-10-CM) - Dizziness  R26.9 (ICD-10-CM) - Gait difficulty   THERAPY DIAG:  Dizziness and giddiness  Unsteadiness on feet  Other abnormalities of gait and mobility  ONSET DATE: 07/04/2023 (MD referral)-symptoms began Feb 2025  Rationale for Evaluation and Treatment: Rehabilitation  SUBJECTIVE:   SUBJECTIVE STATEMENT: Going alright. Patient reports still feeling Imbalance in my head but no dizziness. Reports that she suspects it is coming from her neck.   Pt accompanied by:  self  PERTINENT HISTORY: DM, CAD, chronic diastolic heart failure, HTN, HLD, osteoporosis, a-fib, neuropathy (per Dr. 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 Yes: NPRS scale: no/10 Pain location: R foot Pain description: tenderness  Aggravating factors: depends on how much weight I put Relieving factors: nothing  PRECAUTIONS: Fall  WEIGHT BEARING RESTRICTIONS: No  FALLS: Has patient fallen in last 6 months? No  LIVING ENVIRONMENT: Lives with: lives with their spouse Lives in: House/apartment Stairs: several steps to enter; bedroom on floor Has following equipment at home: Single point cane  PLOF: Independent and Leisure: Enjoys participating in choir  PATIENT GOALS: To help improve things for dizziness, balance  OBJECTIVE:     TODAY'S TREATMENT: 08/17/23 Activity Comments  DHI 30/100  Sitting head turns/nods to targets 30 Reports mild wonky feeling with turns  DGI  13/24  9M walk 2.12 ft/sec  Forward bending to place cones on floor, sidestep over them At counter; no c/o dizziness but required 1-2 UE support to stabilize          [facility] PT Assessment - 08/17/23 0001       Standardized Balance Assessment   Standardized Balance Assessment 10 meter walk test    10 Meter Walk 15.43 sec   2.12 ft/sec     Dynamic Gait Index   Level Surface Moderate Impairment    Change in Gait Speed Mild Impairment    Gait with Horizontal Head Turns Moderate Impairment    Gait with Vertical Head Turns Moderate Impairment    Gait and Pivot Turn Mild Impairment    Step Over Obstacle Normal    Step Around Obstacles Moderate Impairment    Steps Mild Impairment    Total Score 13           PATIENT EDUCATION: Education details: edu on progress towards goals and remaining impairments, edu on habituating quick head turns, HEP update, POC- pt reports that she will feel comfortable with DC within coming visits  Person educated:  Patient Education method: Explanation, Demonstration, Tactile cues, Verbal cues, and Handouts Education comprehension: verbalized understanding and returned demonstration    HOME EXERCISE PROGRAM: Access Code: [email or web address] URL: [email or web address] Date: 08/17/2023 Prepared by: [facility] - Outpatient  Rehab - [facility]  Program Notes perform exercises at counter top or in corner for safety  Exercises - Staggered Stance Forward Backward Weight Shift with Counter Support  - 1 x daily - 5 x weekly - 2 sets - 10 reps - Side to Side Weight Shift with Counter Support  - 1 x daily - 5 x weekly - 2 sets - 10 reps - Romberg Stance with Head Nods  - 1 x daily - 5 x weekly - 2-3 sets - 30 sec hold - Romberg Stance with Head Rotation  - 1 x daily - 5 x weekly - 2-3 sets - 30 sec hold - Romberg Stance with Eyes Closed  - 1 x daily - 5 x weekly - 2-3 sets - 30 sec hold - Alternating Step  Taps with Counter Support  - 1 x daily - 7 x weekly - 2 sets - 10 reps - Backward Walking with Counter Support  - 1 x daily - 7 x weekly - 1 sets - 3-5 reps - Standing Cervical Retraction  - 1 x daily - 7 x weekly - 5-10 reps - 3-5 sec hold - Cervical Extension AROM with Strap  - 1 x daily - 7 x weekly - 1-2 sets - 10 reps - Seated Assisted Cervical Rotation with Towel  - 1 x daily - 7 x weekly - 1-2 sets - 10 reps - Seated Left Head Turns Vestibular Habituation  - 1 x daily - 5 x weekly - 3 sets - 30 sec hold  ___________________________________________________________________________________ Note: Objective measures were completed at Evaluation unless otherwise noted.  DIAGNOSTIC FINDINGS: MRI unremarkable  COGNITION: Overall cognitive status: Within functional limits for tasks assessed   SENSATION: See above-decreased sensation toes and ankles  POSTURE:  rounded shoulders, forward head, and posterior pelvic tilt  Cervical ROM:   Active A/PROM (deg) eval AROM 08/07/23  Flexion 33 30   Extension 25 35  Right lateral flexion    Left lateral flexion    Right rotation 45 45  Left rotation 50 52  (Blank rows = not tested)  TRANSFERS: Assistive device utilized: BUEs from chair  Sit to stand: Modified independence Stand to sit: Modified independence  GAIT: Gait pattern: step through pattern, decreased step length- Right, decreased step length- Left, and wide BOS Distance walked: 50 ft Assistive device utilized: None Level of assistance: SBA Comments: Decreased push-off with gait  PATIENT SURVEYS:  DHI 28  VESTIBULAR ASSESSMENT:  GENERAL OBSERVATION: No acute distress   SYMPTOM BEHAVIOR:  Subjective history: Dizziness has been going on for several months, more of an off-balance feeling.  Denies falls, hx of migraines. Does have hx of diabetic retinopathy, neuropathy.  Has had vertigo in the past and this is different.   Non-Vestibular symptoms: neck tightness  Type of dizziness: Imbalance (Disequilibrium), Unsteady with head/body turns, and Swimmyheaded  Frequency: daily  Duration: comes and goes  Aggravating factors: Induced by motion: looking up at the ceiling, bending down to the ground, turning body quickly, and turning head quickly  Relieving factors: slow movements and hold onto something  Progression of symptoms: better  OCULOMOTOR EXAM:  Wears progressive lenses  Ocular Alignment: normal  Ocular ROM: No Limitations  Spontaneous Nystagmus: absent  Gaze-Induced Nystagmus: slight extra L eye movements at end range   Smooth Pursuits: intact  Saccades: intact  Convergence/Divergence: unable; L eye abducts about 3-4 cm   VESTIBULAR - OCULAR REFLEX:   Slow VOR: Comment: mild symptoms of dizziness (4/10) and neck tightness; mild dizziness (4/10) and neck tightness  VOR Cancellation: Comment: no dizziness, just neck tightness  Head-Impulse Test: HIT Right: positive HIT Left: negative  Dynamic Visual Acuity: NT   POSITIONAL TESTING: Other: NT at eval  due to pt not reporting any dizziness, spinning sensations  MOTION SENSITIVITY: NT at EVAL  Motion Sensitivity Quotient Intensity: 0 = none, 1 = Lightheaded, 2 = Mild, 3 = Moderate, 4 = Severe, 5 = Vomiting  Intensity  1. Sitting to supine   2. Supine to L side   3. Supine to R side   4. Supine to sitting   5. L Hallpike-Dix   6. Up from L    7. R Hallpike-Dix   8. Up from R    9. Sitting, head tipped to L  knee   10. Head up from L knee   11. Sitting, head tipped to R knee   12. Head up from R knee   13. Sitting head turns x5   14.Sitting head nods x5   15. In stance, 180 turn to L    16. In stance, 180 turn to R     OTHOSTATICS: not done  FUNCTIONAL GAIT: 10 meter walk test: 18.78 sec = 1.75 ft/sec   M-CTSIB  Condition 1: Firm Surface, EO 30 Sec, Normal Sway  Condition 2: Firm Surface, EC 30 Sec, Mild Sway  Condition 3: Foam Surface, EO 30 Sec, Mild Sway  Condition 4: Foam Surface, EC 2.75 Sec, Severe Sway     _________________________________________________________________________________________                                                                                                                          GOALS: Goals reviewed with patient? Yes  SHORT TERM GOALS: Target date: 08/10/2023  Pt will be independent with HEP for improved dizziness, balance. Baseline:  Goal status: MET 08/10/23  2.  Pt will perform Condition 4 on MCTSIB for at least 15 sec, for improved balance. Baseline: 16 sec 08/10/23 Goal status: MET 08/10/23  3.  Pt will improve cervical range of motion in all directions by 5 degrees with no c/o increased dizziness for improved functional mboility.  Baseline: improved flexion but still limited 08/10/23  Goal status: NOT MET 08/10/23  LONG TERM GOALS: Target date: 08/24/2023  Pt will be independent with HEP for improved dizziness and balance.. Baseline: met for current 08/17/23 Goal status: IN PROGRESS 08/17/23   2.  DGI score to be  assessed, to improve to at least 19/24 for decreased fall risk. Baseline: 9/24 per note 07/17/23; 13/24 08/17/23  Goal status: IN PROGRESS 08/17/23   3.  Pt will perform Condition 4 on MCTSIB to at least 30 sec mod sway or less, for improved balance. Baseline: 2.75 sec; 16 sec 08/10/23 Goal status: IN PROGRESS 08/10/23   4.  Pt will improve DHI score to less than or equal to 10, to demo decreased dizziness affecting daily activities. Baseline: 28; 30/100 08/17/23 Goal status: IN PROGRESS 08/17/23  5.  Pt will improve gait velocity to at least 2 ft/sec for improved gait efficiency and safety. Baseline: 1.75 ft/sec; 2.12 ft/sec 08/17/23  Goal status: MET /25/25    ASSESSMENT:  CLINICAL IMPRESSION: Patient arrived to session without new complaints. Patient's DHI score is grossly unchanged and she identified bending and quick head turns are contributing to her symptoms. Initiated gaze stability activities to address this. Patient scored 13/24 on DGI, indicating an increased risk of falls but improved from initial assessment. Gait speed today was measured at 2.12 ft/sec and this goal was met. Patient is demonstrating good progress towards goals. Reported understanding of HEP update provided.   OBJECTIVE IMPAIRMENTS: Abnormal gait, decreased balance, decreased mobility, difficulty walking, decreased strength, dizziness, and postural dysfunction.  PLAN:  PT FREQUENCY: 2x/week  PT DURATION: 6 weeks plus eval  PLANNED INTERVENTIONS: 97750- Physical Performance Testing, 97110-Therapeutic exercises, 97530- Therapeutic activity, 97112- Neuromuscular re-education, 97535- Self Care, [id number]- Manual therapy, [phone]- Gait training, Patient/Family education, Balance training, and Vestibular training  PLAN FOR NEXT SESSION: try quick head movements to habituate remaining sx of dizziness/imbalance in my head Continue gait with turns (may need to do this with cane for optimal safety). Weight shifting and gait  training to address shuffling/longer step length; work on Editor, commissioning, VOR exercises.    Louana Terrilyn Christians, PT, DPT 08/17/23 11:02 AM  [facility] [address] [place], [address] [place], [place] [address] Phone # [phone] Fax # [phone]

## 2023-08-17 ENCOUNTER — Ambulatory Visit: Admitting: Physical Therapy

## 2023-08-17 ENCOUNTER — Encounter: Payer: Self-pay | Admitting: Physical Therapy

## 2023-08-17 DIAGNOSIS — R2689 Other abnormalities of gait and mobility: Secondary | ICD-10-CM | POA: Diagnosis not present

## 2023-08-17 DIAGNOSIS — R2681 Unsteadiness on feet: Secondary | ICD-10-CM

## 2023-08-17 DIAGNOSIS — R42 Dizziness and giddiness: Secondary | ICD-10-CM

## 2023-08-20 NOTE — Therapy (Signed)
 OUTPATIENT PHYSICAL THERAPY VESTIBULAR NOTE   Patient Name: Courtney Pearson MRN: [id number] DOB:04-22-1950, 73 y.o., female Today's Date: 08/21/2023       END OF SESSION:  PT End of Session - 08/21/23 1056     Visit Number 11    Number of Visits 13    Date for PT Re-Evaluation 08/24/23    Authorization Type Medicare/[employer]    Progress Note Due on Visit 10    PT Start Time 1018    PT Stop Time 1056    PT Time Calculation (min) 38 min    Equipment Utilized During Treatment Gait belt    Activity Tolerance Patient tolerated treatment well    Behavior During Therapy WFL for tasks assessed/performed            Past Medical History:  Diagnosis Date   Acute on chronic diastolic heart failure (HCC) 03/12/2020   Atypical atrial flutter (HCC) 03/12/2020   CAD in native artery 03/20/2019   Chronic diastolic heart failure (HCC) 03/12/2020   Diabetes mellitus type 1 (HCC) 1969   History of anemia due to chronic kidney disease 08/29/2022   HTN (hypertension)    Hyperlipidemia    Hypothyroidism    Mitral stenosis 11/16/2021   Osteoporosis 2024   Palpitation    Pancytopenia (HCC) 08/29/2022   Persistent atrial fibrillation (HCC) 04/03/2017   Renal insufficiency    Sick sinus syndrome (HCC) 07/06/2020   Weakness 10/06/2020   Past Surgical History:  Procedure Laterality Date   ATRIAL FIBRILLATION ABLATION N/A 01/01/2018   Procedure: ATRIAL FIBRILLATION ABLATION;  Surgeon: Kelsie Agent, MD;  Location: [facility];  Service: Cardiovascular;  Laterality: N/A;   ATRIAL FIBRILLATION ABLATION N/A 10/31/2018   Procedure: ATRIAL FIBRILLATION ABLATION;  Surgeon: Kelsie Agent, MD;  Location: [facility];  Service: Cardiovascular;  Laterality: N/A;   ATRIAL FIBRILLATION ABLATION N/A 01/06/2021   Procedure: ATRIAL FIBRILLATION ABLATION;  Surgeon: Kelsie Agent, MD;  Location: [facility];  Service: Cardiovascular;  Laterality: N/A;   BREAST BIOPSY     BREAST  EXCISIONAL BIOPSY Right 1993   CARDIOVERSION N/A 04/30/2017   Procedure: CARDIOVERSION;  Surgeon: Raford Riggs, MD;  Location: [facility] ENDOSCOPY;  Service: Cardiovascular;  Laterality: N/A;   CARDIOVERSION N/A 02/15/2018   Procedure: CARDIOVERSION;  Surgeon: Raford Riggs, MD;  Location: [facility] ENDOSCOPY;  Service: Cardiovascular;  Laterality: N/A;   CARDIOVERSION N/A 04/10/2018   Procedure: CARDIOVERSION;  Surgeon: Delford Maude BROCKS, MD;  Location: [facility]OSCOPY;  Service: Cardiovascular;  Laterality: N/A;   CESAREAN SECTION     EYE SURGERY     Patient Active Problem List   Diagnosis Date Noted   History of anemia due to chronic kidney disease 08/29/2022   Pancytopenia (HCC) 08/29/2022   Hypothyroidism    Pain due to onychomycosis of toenails of both feet 03/24/2022   Mitral stenosis 11/16/2021   Weakness 10/06/2020   Sick sinus syndrome (HCC) 07/06/2020   Chronic diastolic heart failure (HCC) 03/12/2020   Atypical atrial flutter (HCC) 03/12/2020   CAD in native artery 03/20/2019   Persistent atrial fibrillation (HCC)    Dyspnea 10/30/2012   Palpitations 10/30/2012   Essential hypertension 10/30/2012   Hyperlipidemia 10/30/2012   Diabetes mellitus type 1 (HCC) 1969    PCP: Verena Mems, MD REFERRING PROVIDER: Margaret Eduard SAUNDERS, MD  REFERRING DIAG:  R42 (ICD-10-CM) - Dizziness  R26.9 (ICD-10-CM) - Gait difficulty   THERAPY DIAG:  Dizziness and giddiness  Unsteadiness on feet  Other  abnormalities of gait and mobility  ONSET DATE: 07/04/2023 (MD referral)-symptoms began Feb 2025  Rationale for Evaluation and Treatment: Rehabilitation  SUBJECTIVE:   SUBJECTIVE STATEMENT: Been alright.   Pt accompanied by: self  PERTINENT HISTORY: DM, CAD, chronic diastolic heart failure, HTN, HLD, osteoporosis, a-fib, neuropathy (per Dr. 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 Yes: NPRS scale: no/10 Pain location: R foot Pain  description: tenderness  Aggravating factors: depends on how much weight I put Relieving factors: nothing  PRECAUTIONS: Fall  WEIGHT BEARING RESTRICTIONS: No  FALLS: Has patient fallen in last 6 months? No  LIVING ENVIRONMENT: Lives with: lives with their spouse Lives in: House/apartment Stairs: several steps to enter; bedroom on floor Has following equipment at home: Single point cane  PLOF: Independent and Leisure: Enjoys participating in choir  PATIENT GOALS: To help improve things for dizziness, balance  OBJECTIVE:     TODAY'S TREATMENT: 08/21/23 Activity Comments  review HEP update: sitting head turns to targets  30  Report of dizziness   Standing head turns to targets  30  Visible mild-mod sway but no dizziness   1/2 turns to targets in doorway Mild imbalance; requires 5 steps to complete 1/2 turn  gait + head turns/nods CGA; mod instability and requires cueing to maintain long continuous steps      PATIENT EDUCATION: Education details: verbal review of HEP and consolidation; edu on moving and adjusting positions throughout the day to reduce neck pain, edu on considering balance classes to add variety into exercise routine   Person educated: Patient Education method: Explanation, Demonstration, Tactile cues, Verbal cues, and Handouts Education comprehension: verbalized understanding and returned demonstration    HOME EXERCISE PROGRAM: Access Code: [email or web address] URL: [email or web address] Date: 08/21/2023 Prepared by: [facility] - Outpatient  Rehab - [facility]  Program Notes perform exercises at counter top or in corner for safety  Exercises - Romberg Stance with Head Nods  - 1 x daily - 5 x weekly - 2-3 sets - 30 sec hold - Romberg Stance with Head Rotation  - 1 x daily - 5 x weekly - 2-3 sets - 30 sec hold - Romberg Stance with Eyes Closed  - 1 x daily - 5 x weekly - 2-3 sets - 30 sec hold - Alternating Step Taps with Counter Support  -  1 x daily - 7 x weekly - 2 sets - 10 reps - Walking with Head Nod  - 1 x daily - 5 x weekly - 2 sets - 5 reps - Walking with Head Rotation  - 1 x daily - 5 x weekly - 2 sets - 5 reps - Standing Cervical Retraction  - 1 x daily - 7 x weekly - 5-10 reps - 3-5 sec hold - Cervical Extension AROM with Strap  - 1 x daily - 7 x weekly - 1-2 sets - 10 reps - Seated Assisted Cervical Rotation with Towel  - 1 x daily - 7 x weekly - 1-2 sets - 10 reps    ___________________________________________________________________________________ Note: Objective measures were completed at Evaluation unless otherwise noted.  DIAGNOSTIC FINDINGS: MRI unremarkable  COGNITION: Overall cognitive status: Within functional limits for tasks assessed   SENSATION: See above-decreased sensation toes and ankles  POSTURE:  rounded shoulders, forward head, and posterior pelvic tilt  Cervical ROM:   Active A/PROM (deg) eval AROM 08/07/23  Flexion 33 30  Extension 25 35  Right lateral flexion    Left  lateral flexion    Right rotation 45 45  Left rotation 50 52  (Blank rows = not tested)  TRANSFERS: Assistive device utilized: BUEs from chair  Sit to stand: Modified independence Stand to sit: Modified independence  GAIT: Gait pattern: step through pattern, decreased step length- Right, decreased step length- Left, and wide BOS Distance walked: 50 ft Assistive device utilized: None Level of assistance: SBA Comments: Decreased push-off with gait  PATIENT SURVEYS:  DHI 28  VESTIBULAR ASSESSMENT:  GENERAL OBSERVATION: No acute distress   SYMPTOM BEHAVIOR:  Subjective history: Dizziness has been going on for several months, more of an off-balance feeling.  Denies falls, hx of migraines. Does have hx of diabetic retinopathy, neuropathy.  Has had vertigo in the past and this is different.   Non-Vestibular symptoms: neck tightness  Type of dizziness: Imbalance (Disequilibrium), Unsteady with head/body  turns, and Swimmyheaded  Frequency: daily  Duration: comes and goes  Aggravating factors: Induced by motion: looking up at the ceiling, bending down to the ground, turning body quickly, and turning head quickly  Relieving factors: slow movements and hold onto something  Progression of symptoms: better  OCULOMOTOR EXAM:  Wears progressive lenses  Ocular Alignment: normal  Ocular ROM: No Limitations  Spontaneous Nystagmus: absent  Gaze-Induced Nystagmus: slight extra L eye movements at end range   Smooth Pursuits: intact  Saccades: intact  Convergence/Divergence: unable; L eye abducts about 3-4 cm   VESTIBULAR - OCULAR REFLEX:   Slow VOR: Comment: mild symptoms of dizziness (4/10) and neck tightness; mild dizziness (4/10) and neck tightness  VOR Cancellation: Comment: no dizziness, just neck tightness  Head-Impulse Test: HIT Right: positive HIT Left: negative  Dynamic Visual Acuity: NT   POSITIONAL TESTING: Other: NT at eval due to pt not reporting any dizziness, spinning sensations  MOTION SENSITIVITY: NT at EVAL  Motion Sensitivity Quotient Intensity: 0 = none, 1 = Lightheaded, 2 = Mild, 3 = Moderate, 4 = Severe, 5 = Vomiting  Intensity  1. Sitting to supine   2. Supine to L side   3. Supine to R side   4. Supine to sitting   5. L Hallpike-Dix   6. Up from L    7. R Hallpike-Dix   8. Up from R    9. Sitting, head tipped to L knee   10. Head up from L knee   11. Sitting, head tipped to R knee   12. Head up from R knee   13. Sitting head turns x5   14.Sitting head nods x5   15. In stance, 180 turn to L    16. In stance, 180 turn to R     OTHOSTATICS: not done  FUNCTIONAL GAIT: 10 meter walk test: 18.78 sec = 1.75 ft/sec   M-CTSIB  Condition 1: Firm Surface, EO 30 Sec, Normal Sway  Condition 2: Firm Surface, EC 30 Sec, Mild Sway  Condition 3: Foam Surface, EO 30 Sec, Mild Sway  Condition 4: Foam Surface, EC 2.75 Sec, Severe Sway      _________________________________________________________________________________________  GOALS: Goals reviewed with patient? Yes  SHORT TERM GOALS: Target date: 08/10/2023  Pt will be independent with HEP for improved dizziness, balance. Baseline:  Goal status: MET 08/10/23  2.  Pt will perform Condition 4 on MCTSIB for at least 15 sec, for improved balance. Baseline: 16 sec 08/10/23 Goal status: MET 08/10/23  3.  Pt will improve cervical range of motion in all directions by 5 degrees with no c/o increased dizziness for improved functional mboility.  Baseline: improved flexion but still limited 08/10/23  Goal status: NOT MET 08/10/23  LONG TERM GOALS: Target date: 08/24/2023  Pt will be independent with HEP for improved dizziness and balance.. Baseline: met for current 08/17/23 Goal status: IN PROGRESS 08/17/23   2.  DGI score to be assessed, to improve to at least 19/24 for decreased fall risk. Baseline: 9/24 per note 07/17/23; 13/24 08/17/23  Goal status: IN PROGRESS 08/17/23   3.  Pt will perform Condition 4 on MCTSIB to at least 30 sec mod sway or less, for improved balance. Baseline: 2.75 sec; 16 sec 08/10/23 Goal status: IN PROGRESS 08/10/23   4.  Pt will improve DHI score to less than or equal to 10, to demo decreased dizziness affecting daily activities. Baseline: 28; 30/100 08/17/23 Goal status: IN PROGRESS 08/17/23  5.  Pt will improve gait velocity to at least 2 ft/sec for improved gait efficiency and safety. Baseline: 1.75 ft/sec; 2.12 ft/sec 08/17/23  Goal status: MET /25/25    ASSESSMENT:  CLINICAL IMPRESSION: Patient arrived to session without new complaints. Reviewed HEP update from last session- patient performed this with good tolerance and no dizziness. Progressed to standing with visible sway, but again no dizziness. Proceeded with turns and dual  task gait- patient demonstrated improved stability with turns but difficulty demonstrating long step length with gait with head movements. Modified this activity for home, to be performed with 1 UE support along counter. Patient reported understanding of all edu provided today and without complaints upon leaving.   OBJECTIVE IMPAIRMENTS: Abnormal gait, decreased balance, decreased mobility, difficulty walking, decreased strength, dizziness, and postural dysfunction.   PLAN:  PT FREQUENCY: 2x/week  PT DURATION: 6 weeks plus eval  PLANNED INTERVENTIONS: 97750- Physical Performance Testing, 97110-Therapeutic exercises, 97530- Therapeutic activity, [id number]- Neuromuscular re-education, 97535- Self Care, [id number]- Manual therapy, [phone]- Gait training, Patient/Family education, Balance training, and Vestibular training  PLAN FOR NEXT SESSION: DC Continue gait with turns (may need to do this with cane for optimal safety). Weight shifting and gait training to address shuffling/longer step length; work on Editor, commissioning, VOR exercises.    Louana Terrilyn Christians, PT, DPT 08/21/23 10:57 AM  [facility] [address] [place], [address] [place], [place] [address] Phone # [phone] Fax # [phone]

## 2023-08-21 ENCOUNTER — Encounter: Payer: Self-pay | Admitting: Physical Therapy

## 2023-08-21 ENCOUNTER — Ambulatory Visit: Admitting: Physical Therapy

## 2023-08-21 DIAGNOSIS — R2681 Unsteadiness on feet: Secondary | ICD-10-CM | POA: Diagnosis not present

## 2023-08-21 DIAGNOSIS — R42 Dizziness and giddiness: Secondary | ICD-10-CM | POA: Diagnosis not present

## 2023-08-21 DIAGNOSIS — R2689 Other abnormalities of gait and mobility: Secondary | ICD-10-CM

## 2023-08-23 NOTE — Therapy (Signed)
 OUTPATIENT PHYSICAL THERAPY VESTIBULAR NOTE   Patient Name: Courtney Pearson MRN: [id number] DOB:16-May-1950, 73 y.o., female Today's Date: 08/23/2023       END OF SESSION:      Past Medical History:  Diagnosis Date   Acute on chronic diastolic heart failure (HCC) 03/12/2020   Atypical atrial flutter (HCC) 03/12/2020   CAD in native artery 03/20/2019   Chronic diastolic heart failure (HCC) 03/12/2020   Diabetes mellitus type 1 (HCC) 1969   History of anemia due to chronic kidney disease 08/29/2022   HTN (hypertension)    Hyperlipidemia    Hypothyroidism    Mitral stenosis 11/16/2021   Osteoporosis 2024   Palpitation    Pancytopenia (HCC) 08/29/2022   Persistent atrial fibrillation (HCC) 04/03/2017   Renal insufficiency    Sick sinus syndrome (HCC) 07/06/2020   Weakness 10/06/2020   Past Surgical History:  Procedure Laterality Date   ATRIAL FIBRILLATION ABLATION N/A 01/01/2018   Procedure: ATRIAL FIBRILLATION ABLATION;  Surgeon: Kelsie Agent, MD;  Location: [facility];  Service: Cardiovascular;  Laterality: N/A;   ATRIAL FIBRILLATION ABLATION N/A 10/31/2018   Procedure: ATRIAL FIBRILLATION ABLATION;  Surgeon: Kelsie Agent, MD;  Location: [facility];  Service: Cardiovascular;  Laterality: N/A;   ATRIAL FIBRILLATION ABLATION N/A 01/06/2021   Procedure: ATRIAL FIBRILLATION ABLATION;  Surgeon: Kelsie Agent, MD;  Location: [facility];  Service: Cardiovascular;  Laterality: N/A;   BREAST BIOPSY     BREAST EXCISIONAL BIOPSY Right 1993   CARDIOVERSION N/A 04/30/2017   Procedure: CARDIOVERSION;  Surgeon: Raford Riggs, MD;  Location: [facility] ENDOSCOPY;  Service: Cardiovascular;  Laterality: N/A;   CARDIOVERSION N/A 02/15/2018   Procedure: CARDIOVERSION;  Surgeon: Raford Riggs, MD;  Location: [facility] ENDOSCOPY;  Service: Cardiovascular;  Laterality: N/A;   CARDIOVERSION N/A 04/10/2018   Procedure: CARDIOVERSION;  Surgeon: Delford Maude BROCKS, MD;   Location: [facility] ENDOSCOPY;  Service: Cardiovascular;  Laterality: N/A;   CESAREAN SECTION     EYE SURGERY     Patient Active Problem List   Diagnosis Date Noted   History of anemia due to chronic kidney disease 08/29/2022   Pancytopenia (HCC) 08/29/2022   Hypothyroidism    Pain due to onychomycosis of toenails of both feet 03/24/2022   Mitral stenosis 11/16/2021   Weakness 10/06/2020   Sick sinus syndrome (HCC) 07/06/2020   Chronic diastolic heart failure (HCC) 03/12/2020   Atypical atrial flutter (HCC) 03/12/2020   CAD in native artery 03/20/2019   Persistent atrial fibrillation (HCC)    Dyspnea 10/30/2012   Palpitations 10/30/2012   Essential hypertension 10/30/2012   Hyperlipidemia 10/30/2012   Diabetes mellitus type 1 (HCC) 1969    PCP: Verena Mems, MD REFERRING PROVIDER: Margaret Eduard SAUNDERS, MD  REFERRING DIAG:  R42 (ICD-10-CM) - Dizziness  R26.9 (ICD-10-CM) - Gait difficulty   THERAPY DIAG:  No diagnosis found.  ONSET DATE: 07/04/2023 (MD referral)-symptoms began Feb 2025  Rationale for Evaluation and Treatment: Rehabilitation  SUBJECTIVE:   SUBJECTIVE STATEMENT: Been alright.   Pt accompanied by: self  PERTINENT HISTORY: DM, CAD, chronic diastolic heart failure, HTN, HLD, osteoporosis, a-fib, neuropathy (per Dr. 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 Yes: NPRS scale: no/10 Pain location: R foot Pain description: tenderness  Aggravating factors: depends on how much weight I put Relieving factors: nothing  PRECAUTIONS: Fall  WEIGHT BEARING RESTRICTIONS: No  FALLS: Has patient fallen in last 6 months? No  LIVING ENVIRONMENT: Lives with:  lives with their spouse Lives in: House/apartment Stairs: several steps to enter; bedroom on floor Has following equipment at home: Single point cane  PLOF: Independent and Leisure: Enjoys participating in choir  PATIENT GOALS: To help improve things for  dizziness, balance  OBJECTIVE:    TODAY'S TREATMENT: 08/24/23 Activity Comments                       TODAY'S TREATMENT: 08/21/23 Activity Comments  review HEP update: sitting head turns to targets  30  Report of dizziness   Standing head turns to targets  30  Visible mild-mod sway but no dizziness   1/2 turns to targets in doorway Mild imbalance; requires 5 steps to complete 1/2 turn  gait + head turns/nods CGA; mod instability and requires cueing to maintain long continuous steps      PATIENT EDUCATION: Education details: verbal review of HEP and consolidation; edu on moving and adjusting positions throughout the day to reduce neck pain, edu on considering balance classes to add variety into exercise routine   Person educated: Patient Education method: Explanation, Demonstration, Tactile cues, Verbal cues, and Handouts Education comprehension: verbalized understanding and returned demonstration    HOME EXERCISE PROGRAM: Access Code: [email or web address] URL: [email or web address] Date: 08/21/2023 Prepared by: [facility] - Outpatient  Rehab - [facility]  Program Notes perform exercises at counter top or in corner for safety  Exercises - Romberg Stance with Head Nods  - 1 x daily - 5 x weekly - 2-3 sets - 30 sec hold - Romberg Stance with Head Rotation  - 1 x daily - 5 x weekly - 2-3 sets - 30 sec hold - Romberg Stance with Eyes Closed  - 1 x daily - 5 x weekly - 2-3 sets - 30 sec hold - Alternating Step Taps with Counter Support  - 1 x daily - 7 x weekly - 2 sets - 10 reps - Walking with Head Nod  - 1 x daily - 5 x weekly - 2 sets - 5 reps - Walking with Head Rotation  - 1 x daily - 5 x weekly - 2 sets - 5 reps - Standing Cervical Retraction  - 1 x daily - 7 x weekly - 5-10 reps - 3-5 sec hold - Cervical Extension AROM with Strap  - 1 x daily - 7 x weekly - 1-2 sets - 10 reps - Seated Assisted Cervical Rotation with Towel  - 1 x daily - 7 x weekly - 1-2 sets  - 10 reps    ___________________________________________________________________________________ Note: Objective measures were completed at Evaluation unless otherwise noted.  DIAGNOSTIC FINDINGS: MRI unremarkable  COGNITION: Overall cognitive status: Within functional limits for tasks assessed   SENSATION: See above-decreased sensation toes and ankles  POSTURE:  rounded shoulders, forward head, and posterior pelvic tilt  Cervical ROM:   Active A/PROM (deg) eval AROM 08/07/23  Flexion 33 30  Extension 25 35  Right lateral flexion    Left lateral flexion    Right rotation 45 45  Left rotation 50 52  (Blank rows = not tested)  TRANSFERS: Assistive device utilized: BUEs from chair  Sit to stand: Modified independence Stand to sit: Modified independence  GAIT: Gait pattern: step through pattern, decreased step length- Right, decreased step length- Left, and wide BOS Distance walked: 50 ft Assistive device utilized: None Level of assistance: SBA Comments: Decreased push-off with gait  PATIENT SURVEYS:  DHI 28  VESTIBULAR ASSESSMENT:  GENERAL OBSERVATION: No  acute distress   SYMPTOM BEHAVIOR:  Subjective history: Dizziness has been going on for several months, more of an off-balance feeling.  Denies falls, hx of migraines. Does have hx of diabetic retinopathy, neuropathy.  Has had vertigo in the past and this is different.   Non-Vestibular symptoms: neck tightness  Type of dizziness: Imbalance (Disequilibrium), Unsteady with head/body turns, and Swimmyheaded  Frequency: daily  Duration: comes and goes  Aggravating factors: Induced by motion: looking up at the ceiling, bending down to the ground, turning body quickly, and turning head quickly  Relieving factors: slow movements and hold onto something  Progression of symptoms: better  OCULOMOTOR EXAM:  Wears progressive lenses  Ocular Alignment: normal  Ocular ROM: No Limitations  Spontaneous Nystagmus:  absent  Gaze-Induced Nystagmus: slight extra L eye movements at end range   Smooth Pursuits: intact  Saccades: intact  Convergence/Divergence: unable; L eye abducts about 3-4 cm   VESTIBULAR - OCULAR REFLEX:   Slow VOR: Comment: mild symptoms of dizziness (4/10) and neck tightness; mild dizziness (4/10) and neck tightness  VOR Cancellation: Comment: no dizziness, just neck tightness  Head-Impulse Test: HIT Right: positive HIT Left: negative  Dynamic Visual Acuity: NT   POSITIONAL TESTING: Other: NT at eval due to pt not reporting any dizziness, spinning sensations  MOTION SENSITIVITY: NT at EVAL  Motion Sensitivity Quotient Intensity: 0 = none, 1 = Lightheaded, 2 = Mild, 3 = Moderate, 4 = Severe, 5 = Vomiting  Intensity  1. Sitting to supine   2. Supine to L side   3. Supine to R side   4. Supine to sitting   5. L Hallpike-Dix   6. Up from L    7. R Hallpike-Dix   8. Up from R    9. Sitting, head tipped to L knee   10. Head up from L knee   11. Sitting, head tipped to R knee   12. Head up from R knee   13. Sitting head turns x5   14.Sitting head nods x5   15. In stance, 180 turn to L    16. In stance, 180 turn to R     OTHOSTATICS: not done  FUNCTIONAL GAIT: 10 meter walk test: 18.78 sec = 1.75 ft/sec   M-CTSIB  Condition 1: Firm Surface, EO 30 Sec, Normal Sway  Condition 2: Firm Surface, EC 30 Sec, Mild Sway  Condition 3: Foam Surface, EO 30 Sec, Mild Sway  Condition 4: Foam Surface, EC 2.75 Sec, Severe Sway     _________________________________________________________________________________________                                                                                                                          GOALS: Goals reviewed with patient? Yes  SHORT TERM GOALS: Target date: 08/10/2023  Pt will be independent with HEP for improved dizziness, balance. Baseline:  Goal status: MET 08/10/23  2.  Pt will perform Condition 4 on MCTSIB for at least  15 sec, for improved balance. Baseline: 16 sec 08/10/23 Goal status: MET 08/10/23  3.  Pt will improve cervical range of motion in all directions by 5 degrees with no c/o increased dizziness for improved functional mboility.  Baseline: improved flexion but still limited 08/10/23  Goal status: NOT MET 08/10/23  LONG TERM GOALS: Target date: 08/24/2023  Pt will be independent with HEP for improved dizziness and balance.. Baseline: met for current 08/17/23 Goal status: IN PROGRESS 08/17/23   2.  DGI score to be assessed, to improve to at least 19/24 for decreased fall risk. Baseline: 9/24 per note 07/17/23; 13/24 08/17/23  Goal status: IN PROGRESS 08/17/23   3.  Pt will perform Condition 4 on MCTSIB to at least 30 sec mod sway or less, for improved balance. Baseline: 2.75 sec; 16 sec 08/10/23 Goal status: IN PROGRESS 08/10/23   4.  Pt will improve DHI score to less than or equal to 10, to demo decreased dizziness affecting daily activities. Baseline: 28; 30/100 08/17/23 Goal status: IN PROGRESS 08/17/23  5.  Pt will improve gait velocity to at least 2 ft/sec for improved gait efficiency and safety. Baseline: 1.75 ft/sec; 2.12 ft/sec 08/17/23  Goal status: MET /25/25    ASSESSMENT:  CLINICAL IMPRESSION: Patient arrived to session without new complaints. Reviewed HEP update from last session- patient performed this with good tolerance and no dizziness. Progressed to standing with visible sway, but again no dizziness. Proceeded with turns and dual task gait- patient demonstrated improved stability with turns but difficulty demonstrating long step length with gait with head movements. Modified this activity for home, to be performed with 1 UE support along counter. Patient reported understanding of all edu provided today and without complaints upon leaving.   OBJECTIVE IMPAIRMENTS: Abnormal gait, decreased balance, decreased mobility, difficulty walking, decreased strength, dizziness, and postural  dysfunction.   PLAN:  PT FREQUENCY: 2x/week  PT DURATION: 6 weeks plus eval  PLANNED INTERVENTIONS: 97750- Physical Performance Testing, 97110-Therapeutic exercises, 97530- Therapeutic activity, [id number]- Neuromuscular re-education, 97535- Self Care, [id number]- Manual therapy, [phone]- Gait training, Patient/Family education, Balance training, and Vestibular training  PLAN FOR NEXT SESSION: DC Continue gait with turns (may need to do this with cane for optimal safety). Weight shifting and gait training to address shuffling/longer step length; work on Editor, commissioning, VOR exercises.    Louana Terrilyn Christians, PT, DPT 08/23/23 8:35 AM  [facility] at [facility] [address] [place], [address] [place], [place] [address] Phone # [phone] Fax # [phone]

## 2023-08-24 ENCOUNTER — Encounter: Payer: Self-pay | Admitting: Physical Therapy

## 2023-08-24 ENCOUNTER — Ambulatory Visit: Attending: Diagnostic Neuroimaging | Admitting: Physical Therapy

## 2023-08-24 DIAGNOSIS — R2689 Other abnormalities of gait and mobility: Secondary | ICD-10-CM | POA: Insufficient documentation

## 2023-08-24 DIAGNOSIS — R2681 Unsteadiness on feet: Secondary | ICD-10-CM | POA: Diagnosis not present

## 2023-08-24 DIAGNOSIS — R42 Dizziness and giddiness: Secondary | ICD-10-CM | POA: Diagnosis not present

## 2023-09-13 DIAGNOSIS — E1022 Type 1 diabetes mellitus with diabetic chronic kidney disease: Secondary | ICD-10-CM | POA: Diagnosis not present

## 2023-09-13 DIAGNOSIS — E11319 Type 2 diabetes mellitus with unspecified diabetic retinopathy without macular edema: Secondary | ICD-10-CM | POA: Diagnosis not present

## 2023-09-13 DIAGNOSIS — E039 Hypothyroidism, unspecified: Secondary | ICD-10-CM | POA: Diagnosis not present

## 2023-09-13 DIAGNOSIS — E1042 Type 1 diabetes mellitus with diabetic polyneuropathy: Secondary | ICD-10-CM | POA: Diagnosis not present

## 2023-09-13 DIAGNOSIS — I10 Essential (primary) hypertension: Secondary | ICD-10-CM | POA: Diagnosis not present

## 2023-09-13 DIAGNOSIS — Z794 Long term (current) use of insulin: Secondary | ICD-10-CM | POA: Diagnosis not present

## 2023-09-13 DIAGNOSIS — N1832 Chronic kidney disease, stage 3b: Secondary | ICD-10-CM | POA: Diagnosis not present

## 2023-09-27 DIAGNOSIS — N1832 Chronic kidney disease, stage 3b: Secondary | ICD-10-CM | POA: Diagnosis not present

## 2023-10-01 DIAGNOSIS — I129 Hypertensive chronic kidney disease with stage 1 through stage 4 chronic kidney disease, or unspecified chronic kidney disease: Secondary | ICD-10-CM | POA: Diagnosis not present

## 2023-10-01 DIAGNOSIS — E1022 Type 1 diabetes mellitus with diabetic chronic kidney disease: Secondary | ICD-10-CM | POA: Diagnosis not present

## 2023-10-01 DIAGNOSIS — N1832 Chronic kidney disease, stage 3b: Secondary | ICD-10-CM | POA: Diagnosis not present

## 2023-10-11 ENCOUNTER — Encounter: Payer: Self-pay | Admitting: Podiatry

## 2023-10-11 ENCOUNTER — Ambulatory Visit (INDEPENDENT_AMBULATORY_CARE_PROVIDER_SITE_OTHER): Admitting: Podiatry

## 2023-10-11 DIAGNOSIS — I739 Peripheral vascular disease, unspecified: Secondary | ICD-10-CM | POA: Diagnosis not present

## 2023-10-11 DIAGNOSIS — M79674 Pain in right toe(s): Secondary | ICD-10-CM

## 2023-10-11 DIAGNOSIS — Z7901 Long term (current) use of anticoagulants: Secondary | ICD-10-CM

## 2023-10-11 DIAGNOSIS — M79675 Pain in left toe(s): Secondary | ICD-10-CM

## 2023-10-11 DIAGNOSIS — B351 Tinea unguium: Secondary | ICD-10-CM

## 2023-10-11 NOTE — Progress Notes (Signed)
  Subjective:  Patient ID: Courtney Pearson, female    DOB: 1950-03-18,  MRN: [id number]  Chief Complaint  Patient presents with   Ingrown Toenail    Patient is here for left hallux lateral border    73 y.o. female presents with the above complaint. History confirmed with patient. Patient presenting with pain related to dystrophic thickened elongated nails.  She has concerned about the bilateral first toenail plates and developing some incurvation here.  Left hallux lateral border most primary area of concern.  This is beginning to cause her pain.  She is diabetic, well-controlled.  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 She is on Eliquis . Objective:  Physical Exam: warm, good capillary refill, pedal skin atrophic, decreased pedal hair growth, brawny skin changes anterior legs nail exam onychomycosis of the toenails, ingrown nail at left hallux lateral nail border, onycholysis, and dystrophic nails, nail plates thickened greater than 3 mm DP pulses faintly palpable, PT pulses faintly palpable, and protective sensation absent Left Foot:  Pain with palpation of nails due to elongation and dystrophic growth.  Right Foot: Pain with palpation of nails due to elongation and dystrophic growth.   Assessment:   1. Pain due to onychomycosis of toenails of both feet   2. Chronic anticoagulation   3. PAD (peripheral artery disease) (HCC)      Plan:  Patient was evaluated and treated and all questions answered.  History of type 1 diabetes.  Did review ABIs from April 2025 showing noncompressible vessels to bilateral extremities with some decreased to the toe pressures.  Given this, elected not to perform invasive nail avulsion procedure today.  Did perform slant back nail trims of the bilateral first toenails today using sterile nail nippers without incident.   Patient educated on diabetes. Discussed proper diabetic foot care and discussed risks and complications of disease.  Educated patient in depth on reasons to return to the office immediately should he/she discover anything concerning or new on the feet. All questions answered. Discussed proper shoes as well.  She is anticoagulated on Eliquis .   Recommend that patient follow-up at her convenience for diabetic footcare.  Return if symptoms worsen or fail to improve, for Diabetic Foot Care.         Ethan Saddler, DPM [facility] / [facility]

## 2023-10-25 DIAGNOSIS — D1801 Hemangioma of skin and subcutaneous tissue: Secondary | ICD-10-CM | POA: Diagnosis not present

## 2023-10-25 DIAGNOSIS — I872 Venous insufficiency (chronic) (peripheral): Secondary | ICD-10-CM | POA: Diagnosis not present

## 2023-10-25 DIAGNOSIS — L821 Other seborrheic keratosis: Secondary | ICD-10-CM | POA: Diagnosis not present

## 2023-10-25 DIAGNOSIS — L814 Other melanin hyperpigmentation: Secondary | ICD-10-CM | POA: Diagnosis not present

## 2023-10-31 ENCOUNTER — Other Ambulatory Visit (HOSPITAL_BASED_OUTPATIENT_CLINIC_OR_DEPARTMENT_OTHER): Payer: Self-pay | Admitting: Family

## 2023-11-01 DIAGNOSIS — Z23 Encounter for immunization: Secondary | ICD-10-CM | POA: Diagnosis not present

## 2023-11-14 IMAGING — MG MM DIGITAL SCREENING BILAT W/ TOMO AND CAD
6 of 12 series · 6 of 36 positions shown · non-contrast
Comparison: Previous exam(s).

CLINICAL DATA: Screening.

EXAM:
DIGITAL SCREENING BILATERAL MAMMOGRAM WITH TOMOSYNTHESIS AND CAD
TECHNIQUE: Bilateral screening digital craniocaudal and mediolateral oblique
mammograms were obtained. Bilateral screening digital breast
tomosynthesis was performed. The images were evaluated with
computer-aided detection.

[L CC synth-2D]
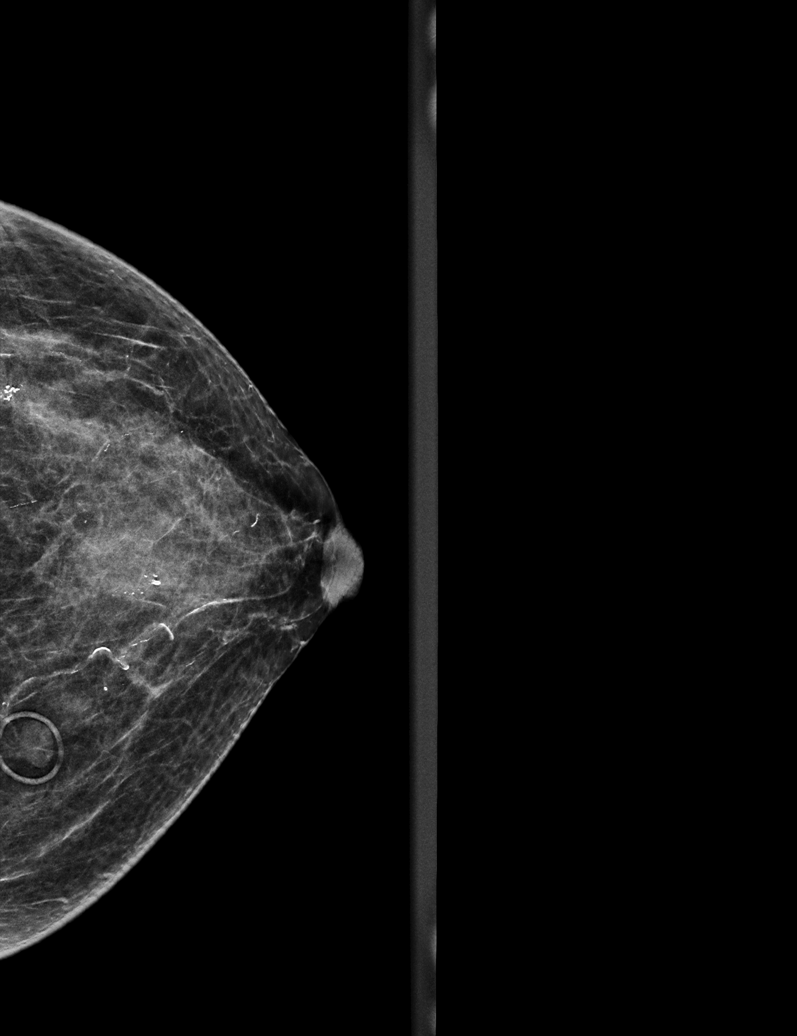

[L XCCL synth-2D]
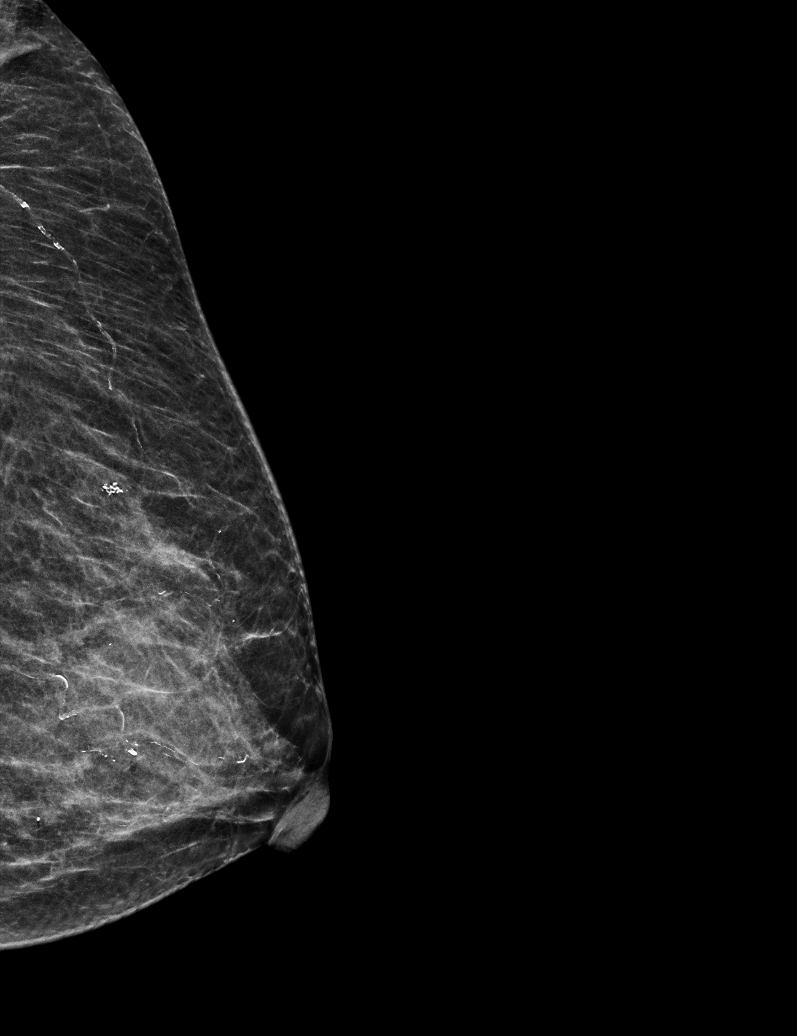

[R XCCL synth-2D]
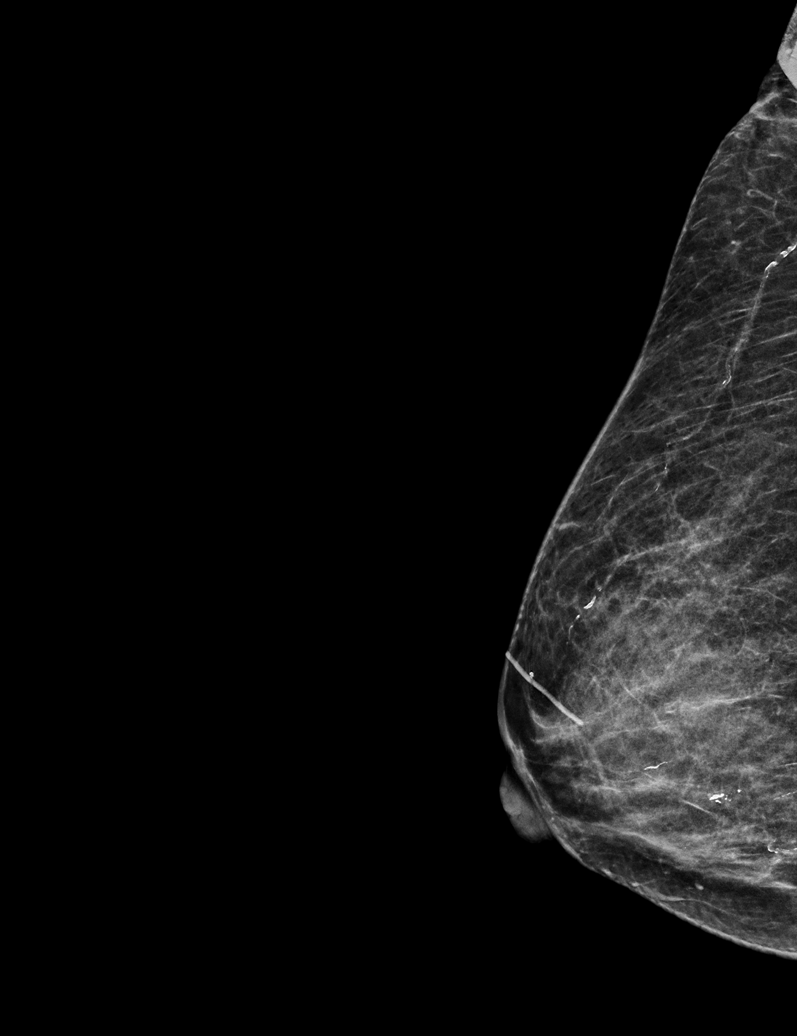

[R MLO synth-2D]
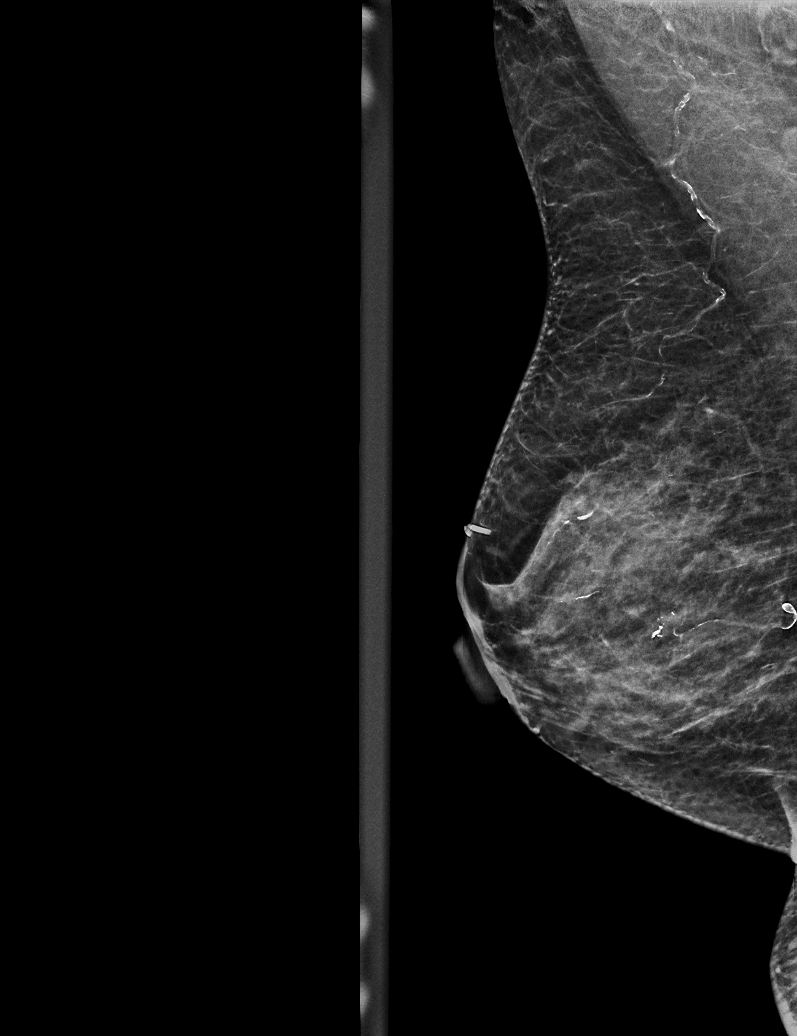

[L MLO synth-2D]
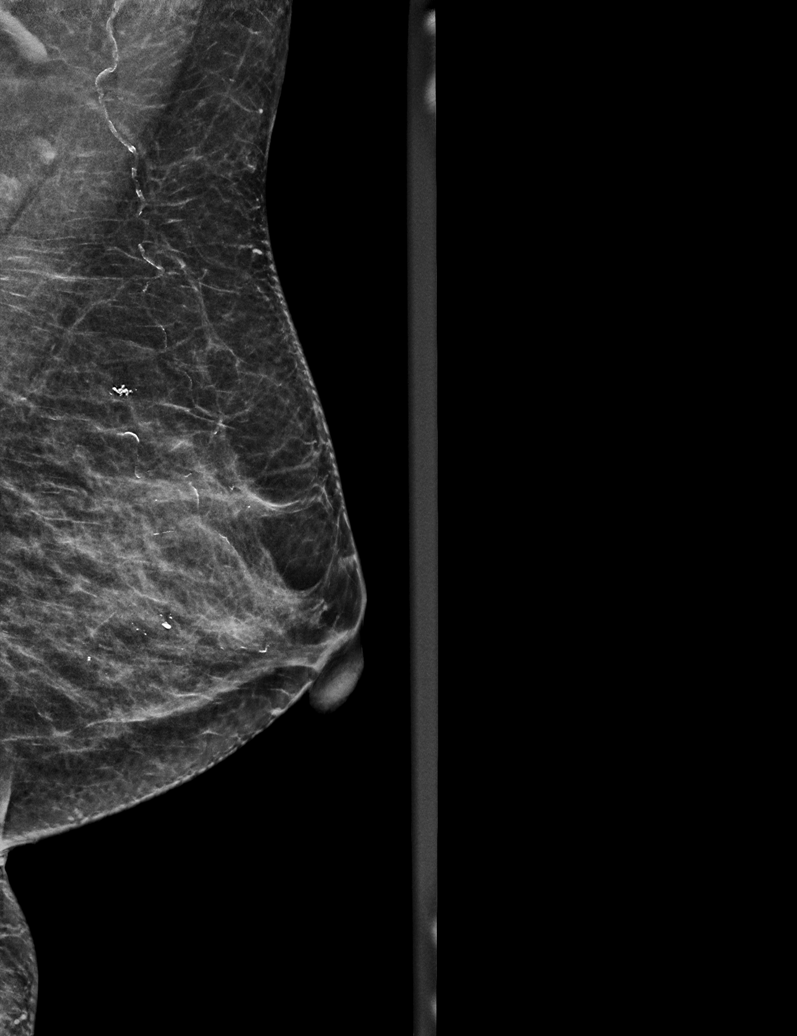

[R CC synth-2D]
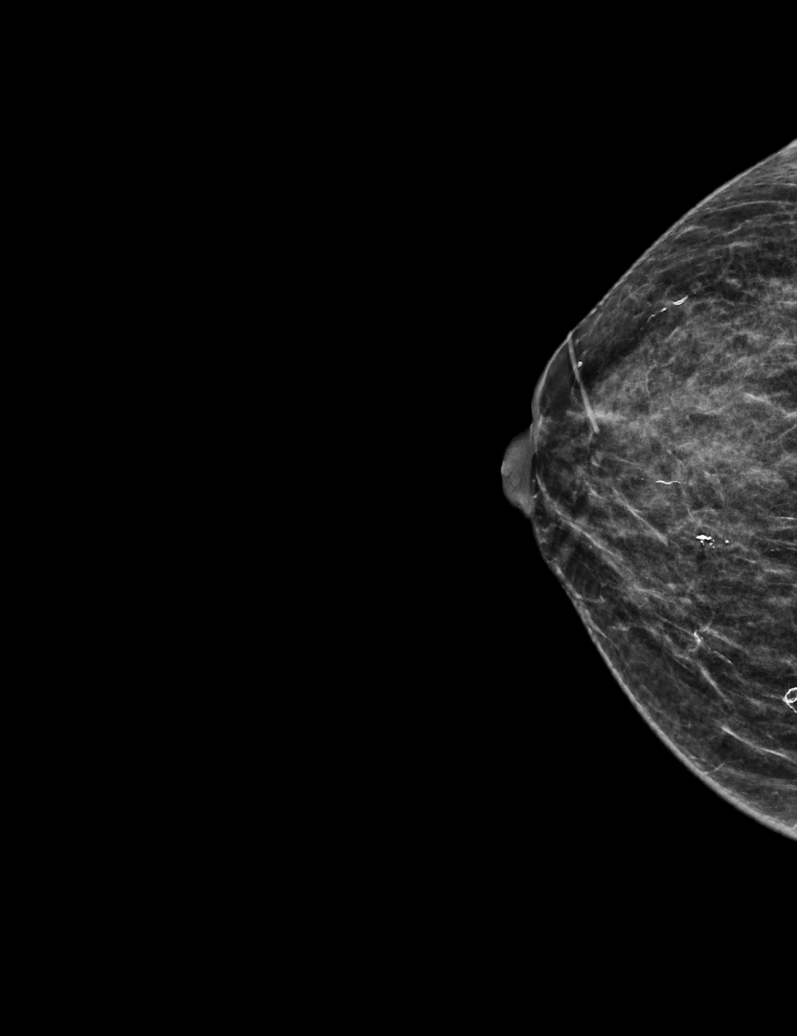

[6 of 36 positions shown; findings below may reference images not displayed]

ACR Breast Density Category c: The breast tissue is heterogeneously
dense, which may obscure small masses.
FINDINGS: There are no findings suspicious for malignancy.
IMPRESSION: No mammographic evidence of malignancy. A result letter of this
screening mammogram will be mailed directly to the patient.

RECOMMENDATION:
Screening mammogram in one year. (Code:Q3-W-BC3)

BI-RADS CATEGORY  1: Negative.

## 2023-11-30 ENCOUNTER — Other Ambulatory Visit (HOSPITAL_BASED_OUTPATIENT_CLINIC_OR_DEPARTMENT_OTHER): Payer: Self-pay | Admitting: Family

## 2023-11-30 DIAGNOSIS — I10 Essential (primary) hypertension: Secondary | ICD-10-CM

## 2023-11-30 DIAGNOSIS — I5032 Chronic diastolic (congestive) heart failure: Secondary | ICD-10-CM

## 2023-12-07 ENCOUNTER — Encounter (INDEPENDENT_AMBULATORY_CARE_PROVIDER_SITE_OTHER): Admitting: Ophthalmology

## 2023-12-07 DIAGNOSIS — H43813 Vitreous degeneration, bilateral: Secondary | ICD-10-CM | POA: Diagnosis not present

## 2023-12-07 DIAGNOSIS — H35033 Hypertensive retinopathy, bilateral: Secondary | ICD-10-CM | POA: Diagnosis not present

## 2023-12-07 DIAGNOSIS — I10 Essential (primary) hypertension: Secondary | ICD-10-CM | POA: Diagnosis not present

## 2023-12-07 DIAGNOSIS — Z794 Long term (current) use of insulin: Secondary | ICD-10-CM | POA: Diagnosis not present

## 2023-12-07 DIAGNOSIS — H35342 Macular cyst, hole, or pseudohole, left eye: Secondary | ICD-10-CM | POA: Diagnosis not present

## 2023-12-07 DIAGNOSIS — E103593 Type 1 diabetes mellitus with proliferative diabetic retinopathy without macular edema, bilateral: Secondary | ICD-10-CM | POA: Diagnosis not present

## 2023-12-13 DIAGNOSIS — E1022 Type 1 diabetes mellitus with diabetic chronic kidney disease: Secondary | ICD-10-CM | POA: Diagnosis not present

## 2023-12-13 DIAGNOSIS — Z136 Encounter for screening for cardiovascular disorders: Secondary | ICD-10-CM | POA: Diagnosis not present

## 2023-12-13 DIAGNOSIS — E11319 Type 2 diabetes mellitus with unspecified diabetic retinopathy without macular edema: Secondary | ICD-10-CM | POA: Diagnosis not present

## 2023-12-13 DIAGNOSIS — I10 Essential (primary) hypertension: Secondary | ICD-10-CM | POA: Diagnosis not present

## 2023-12-13 DIAGNOSIS — E039 Hypothyroidism, unspecified: Secondary | ICD-10-CM | POA: Diagnosis not present

## 2023-12-13 DIAGNOSIS — E1042 Type 1 diabetes mellitus with diabetic polyneuropathy: Secondary | ICD-10-CM | POA: Diagnosis not present

## 2023-12-13 DIAGNOSIS — Z794 Long term (current) use of insulin: Secondary | ICD-10-CM | POA: Diagnosis not present

## 2024-09-11 ENCOUNTER — Encounter (INDEPENDENT_AMBULATORY_CARE_PROVIDER_SITE_OTHER): Admitting: Ophthalmology
